# Patient Record
Sex: Female | Born: 1935 | Race: White | Hispanic: No | Marital: Married | Smoking: Never smoker
Health system: Southern US, Community
[De-identification: ages and names within clinical notes are randomized; demographics above are authoritative.]

## PROBLEM LIST (undated history)

## (undated) DIAGNOSIS — M069 Rheumatoid arthritis, unspecified: Secondary | ICD-10-CM

## (undated) DIAGNOSIS — I442 Atrioventricular block, complete: Secondary | ICD-10-CM

## (undated) DIAGNOSIS — I890 Lymphedema, not elsewhere classified: Secondary | ICD-10-CM

## (undated) DIAGNOSIS — I10 Essential (primary) hypertension: Secondary | ICD-10-CM

## (undated) DIAGNOSIS — I872 Venous insufficiency (chronic) (peripheral): Secondary | ICD-10-CM

## (undated) DIAGNOSIS — I340 Nonrheumatic mitral (valve) insufficiency: Secondary | ICD-10-CM

## (undated) DIAGNOSIS — I5189 Other ill-defined heart diseases: Secondary | ICD-10-CM

## (undated) DIAGNOSIS — I4821 Permanent atrial fibrillation: Secondary | ICD-10-CM

## (undated) DIAGNOSIS — I272 Pulmonary hypertension, unspecified: Secondary | ICD-10-CM

## (undated) DIAGNOSIS — G4730 Sleep apnea, unspecified: Secondary | ICD-10-CM

## (undated) DIAGNOSIS — I639 Cerebral infarction, unspecified: Secondary | ICD-10-CM

## (undated) DIAGNOSIS — R5381 Other malaise: Secondary | ICD-10-CM

## (undated) DIAGNOSIS — Z66 Do not resuscitate: Secondary | ICD-10-CM

## (undated) DIAGNOSIS — E669 Obesity, unspecified: Secondary | ICD-10-CM

## (undated) DIAGNOSIS — J984 Other disorders of lung: Secondary | ICD-10-CM

## (undated) HISTORY — DX: Other ill-defined heart diseases: I51.89

## (undated) HISTORY — DX: Other disorders of lung: J98.4

## (undated) HISTORY — DX: Other malaise: R53.81

## (undated) HISTORY — DX: Essential (primary) hypertension: I10

## (undated) HISTORY — DX: Venous insufficiency (chronic) (peripheral): I87.2

## (undated) HISTORY — DX: Pulmonary hypertension, unspecified: I27.20

## (undated) HISTORY — DX: Atrioventricular block, complete: I44.2

## (undated) HISTORY — DX: Cerebral infarction, unspecified: I63.9

## (undated) HISTORY — DX: Permanent atrial fibrillation: I48.21

## (undated) HISTORY — DX: Rheumatoid arthritis, unspecified: M06.9

## (undated) HISTORY — DX: Sleep apnea, unspecified: G47.30

## (undated) HISTORY — DX: Obesity, unspecified: E66.9

---

## 2000-11-05 ENCOUNTER — Emergency Department (HOSPITAL_COMMUNITY): Admission: EM | Admit: 2000-11-05 | Discharge: 2000-11-05 | Payer: Self-pay | Admitting: Emergency Medicine

## 2000-11-05 ENCOUNTER — Encounter: Payer: Self-pay | Admitting: Emergency Medicine

## 2001-02-26 ENCOUNTER — Other Ambulatory Visit: Admission: RE | Admit: 2001-02-26 | Discharge: 2001-02-26 | Payer: Self-pay | Admitting: Orthopaedic Surgery

## 2002-09-30 DIAGNOSIS — Z7901 Long term (current) use of anticoagulants: Secondary | ICD-10-CM | POA: Insufficient documentation

## 2004-07-11 ENCOUNTER — Ambulatory Visit (HOSPITAL_COMMUNITY): Admission: RE | Admit: 2004-07-11 | Discharge: 2004-07-11 | Payer: Self-pay | Admitting: Ophthalmology

## 2004-11-27 ENCOUNTER — Ambulatory Visit (HOSPITAL_COMMUNITY): Admission: RE | Admit: 2004-11-27 | Discharge: 2004-11-27 | Payer: Self-pay | Admitting: Ophthalmology

## 2005-01-15 ENCOUNTER — Ambulatory Visit: Payer: Self-pay | Admitting: Family Medicine

## 2005-02-17 ENCOUNTER — Ambulatory Visit: Payer: Self-pay | Admitting: Family Medicine

## 2005-03-05 ENCOUNTER — Ambulatory Visit: Payer: Self-pay | Admitting: Family Medicine

## 2005-03-06 ENCOUNTER — Encounter (HOSPITAL_COMMUNITY): Admission: RE | Admit: 2005-03-06 | Discharge: 2005-04-05 | Payer: Self-pay | Admitting: Family Medicine

## 2005-03-07 ENCOUNTER — Ambulatory Visit (HOSPITAL_COMMUNITY): Payer: Self-pay | Admitting: Family Medicine

## 2005-03-26 ENCOUNTER — Ambulatory Visit: Payer: Self-pay | Admitting: Family Medicine

## 2005-05-01 ENCOUNTER — Ambulatory Visit: Payer: Self-pay | Admitting: Family Medicine

## 2005-06-05 ENCOUNTER — Ambulatory Visit: Payer: Self-pay | Admitting: Family Medicine

## 2005-07-09 ENCOUNTER — Inpatient Hospital Stay (HOSPITAL_COMMUNITY): Admission: EM | Admit: 2005-07-09 | Discharge: 2005-07-12 | Payer: Self-pay | Admitting: Emergency Medicine

## 2005-08-07 ENCOUNTER — Ambulatory Visit: Payer: Self-pay | Admitting: Family Medicine

## 2005-09-25 ENCOUNTER — Ambulatory Visit: Payer: Self-pay | Admitting: Family Medicine

## 2005-12-25 ENCOUNTER — Ambulatory Visit: Payer: Self-pay | Admitting: Family Medicine

## 2006-03-30 ENCOUNTER — Ambulatory Visit: Payer: Self-pay | Admitting: Family Medicine

## 2006-04-15 ENCOUNTER — Ambulatory Visit: Payer: Self-pay | Admitting: Vascular Surgery

## 2006-04-27 ENCOUNTER — Ambulatory Visit (HOSPITAL_COMMUNITY): Admission: RE | Admit: 2006-04-27 | Discharge: 2006-04-27 | Payer: Self-pay | Admitting: Vascular Surgery

## 2006-04-27 ENCOUNTER — Ambulatory Visit: Payer: Self-pay | Admitting: Vascular Surgery

## 2006-04-28 ENCOUNTER — Ambulatory Visit: Payer: Self-pay | Admitting: Family Medicine

## 2006-06-16 ENCOUNTER — Ambulatory Visit: Payer: Self-pay | Admitting: Family Medicine

## 2006-08-27 ENCOUNTER — Emergency Department (HOSPITAL_COMMUNITY): Admission: EM | Admit: 2006-08-27 | Discharge: 2006-08-27 | Payer: Self-pay | Admitting: Emergency Medicine

## 2006-10-27 ENCOUNTER — Ambulatory Visit: Payer: Self-pay | Admitting: Vascular Surgery

## 2006-11-12 ENCOUNTER — Ambulatory Visit: Payer: Self-pay | Admitting: Cardiology

## 2010-06-11 NOTE — Assessment & Plan Note (Signed)
OFFICE VISIT   [name], [name]  DOB:  [birth year]                                       10/27/2006  ZOXWR#:[id number]   I saw the patient in the office today for continued followup of her  venous stasis ulcers.  I had originally seen her in March of this year  with bilateral significant venous stasis ulcers, and also what I thought  was some underlying peripheral vascular disease.  For this reason, she  underwent an arteriogram, but in fact was found to have no significant  arterial occlusive disease.  The infrarenal aorta, bilateral common  iliac arteries, and bilateral external iliac arteries were widely  patent.  On the right side, there was very minimal stenosis right at the  distal common femoral artery extending into the deep femoral artery.  The superficial femoral artery and popliteal artery were patent on the  right.  There was some moderate diffuse disease throughout the anterior  tibial artery on the right, although she had 3 vessels run off the  dominant, being the posterior tibial artery.  On the left side, common  femoral, superficial femoral, poplitea, and tibial vessels were all  widely patent with no significant disease noted.  She comes in for a  followup visit.  Of note, we arranged for her to have an extensive  venous evaluation while she was here today.   Since I saw her last, her wounds have been treated at the wound care  center in [place], and according to the family, she is making progress  here.  She has had no fevers or chills.   REVIEW OF SYSTEMS:  She has had no chest pain or chest pressure.   PHYSICAL EXAMINATION:  Blood pressure is 123/79, heart rate is 82.  She  has palpable femoral pulses with biphasic signals in the dorsalis pedis  position bilaterally.  Because of the swelling, I did not try to obtain  posterior tibial signals.  She has essentially a circumferential wound  on the right leg with a less extensive wound on  the left.  These wounds  are seeping.   I have stressed the importance of leg elevation and continued dressing  changes with compression therapy.  Of note, her duplex study showed no  evidence of DVT.  She does have a perforator distally on both sides.   I have recommended that we continue with aggressive wound care at the  [facility] and with elevation.  Hopefully, we can get these  wounds to heal, and then she could be evaluated by either Dr. Arbie Cookey or  Dr. Hart Rochester to see if the perforating vein could be addressed to prevent  future ulceration.  I plan on seeing her back in 6 weeks.  She knows to  call sooner if she has problems.   Di Kindle. Edilia Bo, M.D.  Electronically Signed   CSD/MEDQ  D:  10/27/2006  T:  10/28/2006  Job:  [id number]

## 2010-06-11 NOTE — Procedures (Signed)
LOWER EXTREMITY VENOUS REFLUX EXAM   INDICATION:  Bilateral leg ulcers and pain.   EXAM:  Using color-flow imaging and pulse Doppler spectral analysis, the  right/left common femoral, superficial femoral, popliteal, posterior  tibial, greater and lesser saphenous veins are evaluated.  There is no  evidence suggesting deep venous insufficiency in the right/left lower  extremity.   The right/left saphenofemoral junction is competent.  The right/left GSV  is competent with the caliber as described below.   The right/left proximal short saphenous vein demonstrates competency.   GSV Diameter (used if found to be incompetent only)                                            Right    Left  Proximal Greater Saphenous Vein           cm       cm  Proximal-to-mid-thigh                     cm       cm  Mid thigh                                 cm       cm  Mid-distal thigh                          cm       cm  Distal thigh                              cm       cm  Knee                                      cm       cm   IMPRESSION:  1. No evidence of reflux noted in the right leg.  2. Perforators noted in bilateral calves, area of ulcers.  3. The right/left greater saphenous vein is not aneurysmal.  4. The right/left greater saphenous vein is not tortuous.  5. There is no evidence of significant deep venous insufficiency in      the left lower extremity.  6. The deep venous system is competent.  7. The right/left lesser saphenous vein is competent.  8. No evidence of deep venous thrombosis noted in bilateral legs.   ___________________________________________  Di Kindle. Edilia Bo, M.D.   MG/MEDQ  D:  10/27/2006  T:  10/27/2006  Job:  [id number]

## 2010-06-14 NOTE — Op Note (Signed)
NAMEBRITTENY, [name]             ACCOUNT NO.:  [id number]   MEDICAL RECORD NO.:  [id number]          PATIENT TYPE:  AMB   LOCATION:  DAY                           FACILITY:  [facility]   PHYSICIAN:  Trish Fountain, MD    DATE OF BIRTH:  01-Jul-1933   DATE OF PROCEDURE:  07/11/2004  DATE OF DISCHARGE:  07/11/2004                                 OPERATIVE REPORT   PREOPERATIVE DIAGNOSIS:  Cataract, right eye.   POSTOPERATIVE DIAGNOSIS:  Cataract, right eye.   SURGERY:  Kelman phacoemulsification, right eye, with posterior chamber  intraocular lens, right eye.   ANESTHESIA:  MAC with topical anesthesia of the right eye.   SURGEON:  Trish Fountain, MD   SPECIMENS:  None.   COMPLICATIONS:  None.   HISTORY:  This is a 77 year old female who has slowly progressive decrease  in vision in the right eye.   Lens model is AMO CLRFLXB 19.5 diopter, serial # [id number].   DESCRIPTION OF PROCEDURE:  In the preoperative area, the patient had  Cyclogyl and Neo-Synephrine drops in the right eye in order to dilate the  eye along with Tetracaine to help anesthetize the eye.  Once the patient's  right eye was dilated, the patient was taken to the operating room and  prepped.  The right eye was prepped and draped in the usual sterile manner.  A lid speculum was placed in the right eye, and 2% Xylocaine jelly was  placed in the right eye as well.  A paracentesis was made through clear  cornea at the limbus at approximately the 11 o'clock position of the right  eye.  Nonpreserved Xylocaine 1% 1 cc was placed into the anterior chamber  for one minute.  Viscoat was then used to fill the anterior chamber.  Using  a 2.75 mm blade at the 9 o'clock position, an incision into the anterior  chamber was made through clear cornea near the limbus.  Viscoat was again  used to reform the anterior chamber.  A 25 gauge bent capsulotomy needle was  used to begin the capsulorrhexis through the anterior capsule of  the lens.  Utrata forceps were used to make a 360 degree anterior capsulorrhexis.  A 27  gauge irrigating cannula was used to hydrodissect and hydrodelineate the  nucleus.  Once hydrodissection and hydrodelineation was carried out, [facility]  phacoemulsification was used to make a deep groove in the lens nucleus.  The  lens was rotated 360 degrees and divided into four quadrants using deep  grooves made by phacoemulsification with the [facility] phacoemulsification tip.  The nucleus was then divided using the phaco tip and the nucleus  manipulator.  The nuclear quadrants were then removed using  phacoemulsification.  The posterior capsule was quite loose and fluctuant  throughout the quadrant removal and despite stopping phaco several times in  order to release the posterior capsule from the phaco tip, and using the  nucleus rotator to protect the posterior capsule, the phaco tip did engage  the posterior capsule and created a small round hole just as the last bits  of nucleus  were being removed.  Viscoat was used to plug the posterior  capsular opening.  The irrigation/aspiration was then used to remove as much  of the cortex as could safely be removed without causing any vitreous to  come forward.  The anterior chamber and posterior capsule was filled with  Provisc, and the 9 o'clock position incision was slightly widened, using the  same 2.75 mm blade that was initially used to make the incision.  An  intraocular lens was placed in the shooter, and this was placed in the eye  the leading haptic being placed in the sulcus just behind the iris, followed  by placement of the trailing haptic into the sulcus, using the Kugelan.  The  IOL centered well because the entire anterior capsule peripherally remained  intact.  Irrigation/aspiration was then used again to remove more of the  cortical remnants using the IOL to tamponade the posterior capsular hole.  Some small bits of cortex remained and were  unable to be removed without  causing the vitreous to come forward and compromising the remainder of the  surgical procedure.  Miochol was used to constrict the pupil, and it was  noted that there was no vitreous anterior to the IOL or in the incision  using the iris sweep.  Two 10-0 nylon sutures were used to close the  incision, which was again checked for vitreous--there was none.  The  incision site was then checked for water tightness, using a Weck-cel.  Half-  strength Betadine solution was placed, 1 drop, in the inner canthus, and 1  drop in the outer canthus.  After one minute, this was rinsed from the eye.  Drops were placed in the eye, Vigamox, followed by Nevanac followed by  Econopred.  A shield was placed over the patient's right eye, and the  patient was sent to the recovery room in satisfactory condition.       PVK/MEDQ  D:  07/15/2004  T:  07/15/2004  Job:  [id number]

## 2010-06-14 NOTE — Op Note (Signed)
[name], [name]             ACCOUNT NO.:  [id number]   MEDICAL RECORD NO.:  [id number]          PATIENT TYPE:  AMB   LOCATION:  SDS                          FACILITY:  [facility]   PHYSICIAN:  Di Kindle. Edilia Bo, M.D.DATE OF BIRTH:  1933/09/10   DATE OF PROCEDURE:  04/27/2006  DATE OF DISCHARGE:                               OPERATIVE REPORT   PREOPERATIVE DIAGNOSIS:  Nonhealing venous stasis ulcers.   POSTOPERATIVE DIAGNOSIS:  Nonhealing venous stasis ulcers.   PROCEDURES:  1. Aortogram.  2. Bilateral iliac arteriogram.  3. Bilateral lower extremity runoff.   SURGEON:  Di Kindle. Edilia Bo, M.D.   INDICATIONS:  This is a 77 year old woman with a history of obesity and  chronic venous insufficiency, who has had venous stasis ulcers for over  a year.  These would intermittently heal and then recur.  She underwent  a Doppler study at [facility], which showed an ankle brachial  index of 71% of the right and 55% of left.  Digital pressure on the  right was 76 mmHg and on the left 79 mmHg.  Although typically a  pressure in the 70s with suggest adequate circulation to healing, given  the prolonged nature of these wounds I felt arteriography was indicated  in order to determine if there were any options for revascularization.  The procedure and potential complications including but not limited to  bleeding, arterial injury, dye reaction, and kidney failure, were  discussed with the patient.  All her questions were answered and she was  agreeable to proceed.   TECHNIQUE:  The patient was taken to the PV lab at [facility] and sedated with  a milligram of Versed and 50 mcg of fentanyl.  Both groins were prepped  and draped in the usual sterile fashion.  After the skin was infiltrated  with 1% lidocaine, the right common femoral artery was cannulated and a  guidewire introduced into the infrarenal aorta under fluoroscopic  control.  The 5-French sheath was introduced over the  wire and the  dilator was removed.  A pigtail catheter was positioned at the L1  vertebral body and flush aortogram obtained.  The catheter was then  repositioned above the aortic bifurcation and oblique iliac projections  were obtained.  Bilateral lower extremity runoff films were then  obtained.  The patient then had additional spot films obtained of both  legs.   FINDINGS:  There are single renal arteries bilaterally with no  significant renal artery stenosis identified.  The celiac and SMA filled  briskly.  The infrarenal aorta, bilateral common iliac arteries,  bilateral hypogastric arteries, bilateral external iliac arteries are  widely patent bilaterally with no significant disease.   On the right side the common femoral artery is patent with a very  minimal stenosis right at the distal common femoral artery extending  into the deep femoral artery.  The superficial femoral and popliteal  artery are patent on the right.  There is moderate diffuse disease  throughout the anterior tibial on the right but through all three  vessels are patent on the right with the dominant runoff being through  the posterior tibial artery.  The peroneal artery on the right is  patent.   On the left side the common femoral, superficial femoral, deep femoral,  popliteal, anterior tibial, posterior tibial and peroneal arteries are  all widely patent.  There is no significant disease noted.   IMPRESSION:  1. Minimal stenosis of the right groin of probably 10%.  2. Some moderate diffuse disease of the anterior tibial artery on the      right.      Di Kindle. Edilia Bo, M.D.  Electronically Signed     CSD/MEDQ  D:  04/27/2006  T:  04/27/2006  Job:  [id number]   cc:   Helayne Seminole, M.D.  Judene Companion, M.D.

## 2010-06-14 NOTE — Op Note (Signed)
[name], [name]             ACCOUNT NO.:  [id number]   MEDICAL RECORD NO.:  [id number]          PATIENT TYPE:  AMB   LOCATION:  DAY                           FACILITY:  [facility]   PHYSICIAN:  Trish Fountain, MD    DATE OF BIRTH:  1933/07/10   DATE OF PROCEDURE:  11/27/2004  DATE OF DISCHARGE:                                 OPERATIVE REPORT   /PREOPERATIVE DIAGNOSIS:  Cataract, left eye.   POSTOPERATIVE DIAGNOSIS:  Cataract, left eye.   SURGERY:  Kelman phacoemulsification, left eye, with posterior chamber  intraocular lens, left eye.   ANESTHESIA:  MAC with topical anesthesia of the left eye.   SURGEON:  Trish Fountain, MD   SPECIMENS:  None.   COMPLICATIONS:  None.   HISTORY:  This is a 77 year old female who has slowly progressive decrease  in vision in the left eye.   LENS MODEL:  AMO CLRFLXC, 20.0 diopter lens, serial #[id number].   DESCRIPTION OF PROCEDURE:  In the preoperative area, the patient had  Cyclogyl and Neo-Synephrine drops in the left eye in order to dilate the eye  along with Tetracaine to help anesthetize the eye.  Once the patient's left  eye was dilated, the patient was taken to the operating room and prepped.  The left eye was prepped and draped in the usual sterile manner.  A lid  speculum was placed in the left eye, and 2% Xylocaine jelly was placed in  the left eye as well.  A paracentesis was made through clear cornea at the  limbus at approximately the 5 o'clock position of the left eye.  Nonpreserved Xylocaine 1% 1 cc was placed into the anterior chamber for one  minute.  Viscoat was then used to fill the anterior chamber.  Using a 2.75  mm blade at the 3 o'clock position, an incision into the anterior chamber  was made through clear cornea near the limbus.  Viscoat was again used to  reform the anterior chamber.  A 25 gauge bent capsulotomy needle was used to  begin the capsulorrhexis through the anterior capsule of the lens.  Utrata  forceps were used to make a 360 degree anterior capsulorrhexis.  A Chang 27  gauge irrigating cannula was used to hydrodissect and hydrodelineate the  nucleus.  Once hydrodissection and hydrodelineation was carried out, [facility]  phacoemulsification was used to make a deep groove in the lens nucleus.  The  lens was rotated 360 degrees and divided into four quadrants using deep  grooves made by phacoemulsification with the [facility] phacoemulsification tip.  The nucleus was then divided using the phaco tip and the nucleus  manipulator.  The nuclear quadrants were then removed using  phacoemulsification.  The irrigation aspiration was then used to remove the  remainder of the cortex.  The anterior chamber and posterior capsule was  filled with Provisc, and the 3 o'clock position incision was slightly  widened, using the same 2.75 mm blade that was initially used to make the  incision.  An intraocular lens was placed in the shooter, and this was  placed in the  eye, followed by placement of the trailing haptic into the  posterior capsule, using the [place].  Irrigation/aspiration was then used  to remove Provisc from the anterior chamber and the posterior capsule.  BSS  on a syringe was then used to hydrate the cornea at the 3 o'clock incision  site.  The incision site was then checked for water tightness, using a Weck-  cel.  Half-strength Betadine solution was placed, 1 drop, in the inner  canthus, and 1 drop in the outer canthus.  After one minute, this was rinsed  from the eye.  Drops were placed in the eye, Vigamox, followed by Nevanac  followed by Econopred.  A shield was placed over the patient's left eye, and  the patient was sent to the recovery room in satisfactory condition.      Trish Fountain, MD  Electronically Signed     PVK/MEDQ  D:  11/28/2004  T:  11/28/2004  Job:  [id number]

## 2010-06-14 NOTE — H&P (Signed)
NAMERONETTE, HANK             ACCOUNT NO.:  [id number]   MEDICAL RECORD NO.:  [id number]          PATIENT TYPE:  INP   LOCATION:  A313                          FACILITY:  [facility]   PHYSICIAN:  Hanley Hays. Dechurch, M.D.DATE OF BIRTH:  [birth year]   DATE OF ADMISSION:  07/09/2005  DATE OF DISCHARGE:  LH                                HISTORY & PHYSICAL   HISTORY OF PRESENT ILLNESS:  This is a 77 year old Caucasian female followed  by Dr. Joette Catching in [place] with a past medical history remarkable for  hypertension, chronic venous stasis, chronic atrial fibrillation not on  Coumadin, secondary to chronic blood loss apparently, who has been  hospitalized multiple times at [facility] but not in the [facility]  system, who presents with a 2-3 day history of worsening lower extremity  pain.  She has chronic bilateral lower extremity ulcers which have been  treated with wet-to-dry dressings, apparently by Home Health without  significant improvement.  She has noted some increased redness, though no  fever or chills.  Most importantly she has had increasing pain.  She does  have a mild leukocytosis and because of this she is being admitted for IV  antibiotics and further evaluation and treatment.   PAST MEDICAL HISTORY:  1.  Remarkable for rheumatoid arthritis on chronic prednisone therapy.  2.  History of atrial fibrillation.  3.  History of congestive heart failure, though she apparently had a      catheterization several years ago and was told she does not have      obstructive disease.  4.  Obesity.  5.  History of obstructive sleep apnea on BiPAP which she has not been using      much over the last several months, as she has lost weight and doesn't      need it.  6.  She has a history of cervical polyps.  7.  Tonsillectomy, but no other surgeries.   MEDICATIONS:  1.  Diovan 80 mg daily.  2.  Coreg 25 mg daily.  3.  Lasix 20 mg daily.  4.  Digoxin 0.25 mg every other  day.  5.  Prednisone 10 mg daily.  6.  Prevacid 30 mg daily.  7.  Multivitamin daily.  8.  Glucosamine.  9.  Cod liver oil.   ALLERGIES:  She has no known allergies.   SOCIAL HISTORY:  She lives with her husband of 37 years of age who is  apparently in fairly good health.  No alcohol or tobacco abuse.  She has  four children who live locally and are alive and well.  She has three  younger siblings.   FAMILY HISTORY:  Apparently no known coronary artery disease or diabetes,  colon cancer, etc.  Mother is still living with Alzheimer's.  Father died at  age 3.  He had a history of coronary artery disease with bypass at age 32.   REVIEW OF SYSTEMS:  The patient is actually independent with ADLs, though  fairly sedentary.  No GI or GU complaints.  Intentional weight loss, though  she has gained  back 20 pounds of the 70 she lost.  Good appetite.  She  states she is doing very well.  She denies any shortness of breath, though  she appears dyspneic with exertion.  She uses oxygen on an as needed basis  but usually at night.  Pain has been well-controlled with Aleve at home.  She apparently does not tolerate Coumadin secondary to bleeding, specifics  unknown.   PHYSICAL EXAMINATION:  GENERAL:  Reveals a pleasant, elderly female, obese,  no distress, though she is dyspneic with conversation, though denies.  VITAL SIGNS:  Blood pressure is 142/88, weight 220 pounds, temperature is  97.9.  Heart rate documented at 98, now about 70.  LUNGS:  Diminished bilaterally without rales or rhonchi.  HEART:  Predominantly regular.  Cannot appreciate a murmur.  ABDOMEN:  Obese and soft.  EXTREMITIES:  Without clubbing or cyanosis.  She has chronic stasis changes.  She has large bilateral on the right a posterior full thickness wound with  granulated base.  She has a larger anterior lower extremity wound similar in  nature.  She really does not have significant cellulitis type changes.  Her  feet are  warm.  There is 1+ edema.  Pulses are palpable in the dorsalis  pedis bilaterally, though they are diminished.   ASSESSMENT/PLAN:  1.  Venous stasis  ulcers with intractable pain and potentially mild      cellulitis.  This patient has received Unasyn which will continue.  She      would benefit from a Pro 4 Unna boot given her chronic stasis, which      will help the pain as well as accelerate healing.  This will be placed      by therapy tomorrow.  She really does not have cellulitis that would      prohibit these dressings.  2.  History of chronic atrial fibrillation.  Currently rate is controlled.      She is on a somewhat complex regimen.  Until further data is available      would not make changes.  Will check a digoxin level, however.  3.  Borderline hyperkalemia.  Not on supplement.  Again, will monitor.  4.  History of anemia.  Currently her hemoglobin is stable.  No changes at      this time.  5.  Chronic prednisone therapy for her rheumatoid arthritis.  Will maintain.  6.  Pain secondary to her venous stasis.  She has received one dose of      Toradol and will resume some Percocet which has been effective for her      in the past.  She may need augmentation during the hospital stay and at      home.  Vitamin D has proven somewhat effectiveness in this setting as      far as her pain control as well as decreased falls and improved      stability and certainly this should be considered.  7.  History of obstructive sleep apnea.  The patient is not using her BiPAP      since her weight loss, therefore, will hold.      Hanley Hays Josefine Class, M.D.  Electronically Signed     FED/MEDQ  D:  07/09/2005  T:  07/09/2005  Job:  [id number]

## 2010-06-14 NOTE — Discharge Summary (Signed)
[name], [name]             ACCOUNT NO.:  [id number]   MEDICAL RECORD NO.:  [id number]          PATIENT TYPE:  INP   LOCATION:  A313                          FACILITY:  [facility]   PHYSICIAN:  Margaretmary Dys, M.D.DATE OF BIRTH:  Sep 29, 1933   DATE OF ADMISSION:  07/09/2005  DATE OF DISCHARGE:  06/16/2007LH                                 DISCHARGE SUMMARY   DISCHARGE DIAGNOSES:  1.  Chronic venous stasis ulcers of lower extremities with cellulitis.  2.  Chronic atrial fibrillation.  3.  History of rheumatoid arthritis.  4.  Hyperkalemia.   DISCHARGE MEDICATIONS:  1.  Augmentin 875 mg p.o. b.i.d. for 7 days.  2.  Vicodin 1-2 tabs p.o. every 4 hours as needed for pain.  3.  Diovan 80 mg p.o. once a day.  4.  Coreg 25 mg once a day.  5.  Lasix 20 mg p.o. once a day.  6.  Digoxin 0.25 mg every other day.  7.  Prednisone 10 mg once a day.  8.  Prevacid 30 mg once a day.  9.  Multivitamin one daily.  10. Glucosamine.  11. Cod liver oil.   DIET:  Low salt diet.   ACTIVITY:  As tolerated.  The patient encouraged to increase activity level.   SPECIAL INSTRUCTIONS:  None.   HOSPITAL COURSE:  [name] is a 77 year old Caucasian female who is  followed by Dr. Lysbeth Galas in [place].  She does have a past medical history  significant for hypertension, chronic venous stasis, chronic atrial  fibrillation, not on Coumadin secondary to chronic blood loss.  The patient  has been hospitalized multiple times at [facility] with what appears  to be cellulitis of her lower extremities.  She presents to [facility] with a 2-to-3-day history of worsening of extremity pain and some  fevers.  The patient is being dressed with wet-to-dry dressings at home with  no improvement.  She is has also had some redness which has worsened with  the pain.  In the emergency room, the patient was noted to have some mild  leukocytosis and the plan was to admit her for IV antibiotics.  While  here, the patient was started on vancomycin because of concerns for  probable MRSA.  She also received Unasyn.  The patient's white cells  recovered very well and she remained mostly afebrile throughout her  hospitalization here.  Atrial fibrillation was also well controlled.  Overall, there were no  complications during her hospital stay.  The patient did well and was  discharged home on oral antibiotics.   FOLLOWUP:  The patient is to follow up with her primary care physician in 2-  3 weeks.      Margaretmary Dys, M.D.  Electronically Signed     AM/MEDQ  D:  07/26/2005  T:  07/26/2005  Job:  [id number]   cc:   Delaney Meigs, M.D.  Fax: [phone]

## 2010-06-14 NOTE — Group Therapy Note (Signed)
[name], [name]             ACCOUNT NO.:  [id number]   MEDICAL RECORD NO.:  [id number]          PATIENT TYPE:  INP   LOCATION:  A313                          FACILITY:  [facility]   PHYSICIAN:  Osvaldo Shipper, MD     DATE OF BIRTH:  [birth year]   DATE OF PROCEDURE:  07/11/2005  DATE OF DISCHARGE:                                   PROGRESS NOTE   Subjectively, patient feels better.  She is still having pain in both her  legs, however, she states controlled with her current regimen of pain  medications.   She is also requesting a referral to [facility].  I have written her a prescription for this.  She is also requesting a  prescription for Tylenol with codeine No. 3 which also I have written.   PHYSICAL EXAMINATION:  VITAL SIGNS:  Objectively, patient remains afebrile.  Heart rate is 80, irregular, respiratory rate is 18, blood pressure is  146/73, saturation 99% on 2 liters by nasal cannula.  LUNGS:  Clear to auscultation bilaterally.  CARDIOVASCULAR:  S1, S2 is regular, rate is controlled.  ABDOMEN:  Soft, nontender, nondistended, bowel sounds present, no mass or  organomegaly appreciated.  EXTREMITIES:  Covered with Profore wraps.  No seepage is noted.  She has  good capillary refill in her toes.   LABORATORY DATA:  White count today is 11.0, hemoglobin is 10.6 (down from  11.8 yesterday), MCV is 91, platelet count is 427.  BMP shows glucose 147,  calcium is 8.3.  Digoxin is 0.5.  Wound culture is showing Gram-negative  rods.   No imaging studies have been done.   ASSESSMENT AND PLAN:  Problem 1.  Chronic venous stasis ulcers of lower  extremities with pain.  Her pain seems to be better controlled.  Continue  current regimen of Tylenol, Lortab.  She is also on Unasyn which I think she  will need for at least one more day.  I think patient has probably improved  based on what I am seeing from the notes from the previous couple of days.  I  anticipate patient being able to be discharged tomorrow.   Problem 2.  History of atrial fibrillation.  This is chronic.  Rate is  controlled.  She is not on any anticoagulation at this time.  We will defer  all these issues to her PMD.  She is on Diovan, Coreg, digoxin, Lasix.  A  fairly complicated regimen which we will not interfere with at this time.   Problem 3.  History of rheumatoid arthritis for which she is getting chronic  prednisone which is also being continued at this time.  DVT, GI prophylaxis  ongoing.   DISPOSITION:  I think patient may be able to be discharged by tomorrow if  she continues to feel better.  Her white count is down today.  Hemoglobin is  a little bit down today, but could be dilutional.  There is no overt blood  loss noted.  A repeat CBC, BMP will be checked tomorrow morning.      Osvaldo Shipper,  MD  Electronically Signed     GK/MEDQ  D:  07/11/2005  T:  07/11/2005  Job:  [id number]   cc:   Delaney Meigs, M.D.  Fax: [phone]

## 2010-06-14 NOTE — Group Therapy Note (Signed)
NAMEHANNAN, [name]             ACCOUNT NO.:  [id number]   MEDICAL RECORD NO.:  [id number]          PATIENT TYPE:  INP   LOCATION:  A313                          FACILITY:  [facility]   PHYSICIAN:  Margaretmary Dys, M.D.DATE OF BIRTH:  Nov 08, 1933   DATE OF PROCEDURE:  07/10/2005  DATE OF DISCHARGE:                                   PROGRESS NOTE   SUBJECTIVE:  Patient feels much better today.  I actually saw her having the  dressings placed and she looked quite comfortable.  She said she has some  pain, but not terribly so.   OBJECTIVE:  GENERAL:  Conscious, alert, comfortable, not in no acute  distress, pleasant.  VITAL SIGNS:  Her blood pressure is 126/70, pulse of 80, respirations 20,  temperature 98.3, oxygen saturation is 100% on 2 liters.  HEENT EXAM:  Normocephalic and atraumatic.  Oral mucosa was moist, no  exudates.  NECK:  Supple, no JVD.  LUNGS:  Clear.  HEART:  S1-S2, regular.  ABDOMEN:  Soft, bowel sounds positive.  EXTREMITIES:  Bilateral ulcers of the lateral aspect of both lower  extremities, fairly extensive with a large anterior lower extremity wound  similar in nature with surrounding cellulitis.  The base of the ulcers look  pretty clean though.  She also had evidence of chronic stasis dermatitis.   LABORATORY/DIAGNOSTIC DATA:  White blood cell count was 9.8, hemoglobin  11.8, hematocrit 36.4, platelet count was 414, no left shift.  Sodium 141,  potassium 4.5, chloride 114, CO2 of 19, glucose 101, BUN of 17, creatinine  0.8.  Digoxin level was 0.5.   ASSESSMENT AND PLAN:  1.  Chronic leg ulcers with cellulitis with severe pain.  Patient doing      fairly well.  She received some Unasyn.  We will continue with efforts      to improve her stasis and also daily dressing.  She is currently having      Vaseline gauze dressings.  Dressings may not be changed for 5-7 days.  2.  Chronic atrial fibrillation.  Rate is controlled.  Patient is      comfortable at this  time.  3.  Hyperkalemia.  This is improved.  4.  Chronic prednisone therapy due to rheumatoid arthritis, stable.   DISPOSITION:  I think, once we are able to give her the IV antibiotics over  the next 2-3 days and continue with dressing, we will have to come with a  plan, either through home health or through dressing here.  I discussed with  physical therapy.  She said she will only need dressing once a week.  The  dressings will only need to be changed if she soaks through the dressings.      Margaretmary Dys, M.D.  Electronically Signed     AM/MEDQ  D:  07/10/2005  T:  07/10/2005  Job:  [id number]

## 2010-08-12 ENCOUNTER — Inpatient Hospital Stay (HOSPITAL_COMMUNITY)
Admission: EM | Admit: 2010-08-12 | Discharge: 2010-08-14 | DRG: 243 | Disposition: A | Discharge status: Home Health | Payer: Medicare PPO | Attending: Cardiovascular Disease | Admitting: Cardiovascular Disease

## 2010-08-12 ENCOUNTER — Emergency Department (HOSPITAL_COMMUNITY): Payer: Medicare PPO

## 2010-08-12 DIAGNOSIS — E669 Obesity, unspecified: Secondary | ICD-10-CM | POA: Diagnosis present

## 2010-08-12 DIAGNOSIS — I10 Essential (primary) hypertension: Secondary | ICD-10-CM | POA: Diagnosis present

## 2010-08-12 DIAGNOSIS — R55 Syncope and collapse: Secondary | ICD-10-CM

## 2010-08-12 DIAGNOSIS — I872 Venous insufficiency (chronic) (peripheral): Secondary | ICD-10-CM | POA: Diagnosis present

## 2010-08-12 DIAGNOSIS — G4733 Obstructive sleep apnea (adult) (pediatric): Secondary | ICD-10-CM | POA: Diagnosis present

## 2010-08-12 DIAGNOSIS — I4891 Unspecified atrial fibrillation: Secondary | ICD-10-CM | POA: Diagnosis present

## 2010-08-12 DIAGNOSIS — I442 Atrioventricular block, complete: Secondary | ICD-10-CM | POA: Diagnosis present

## 2010-08-12 DIAGNOSIS — Z79899 Other long term (current) drug therapy: Secondary | ICD-10-CM

## 2010-08-12 DIAGNOSIS — I509 Heart failure, unspecified: Secondary | ICD-10-CM | POA: Diagnosis present

## 2010-08-12 DIAGNOSIS — I498 Other specified cardiac arrhythmias: Principal | ICD-10-CM | POA: Diagnosis present

## 2010-08-12 DIAGNOSIS — L97209 Non-pressure chronic ulcer of unspecified calf with unspecified severity: Secondary | ICD-10-CM | POA: Diagnosis present

## 2010-08-12 DIAGNOSIS — I2789 Other specified pulmonary heart diseases: Secondary | ICD-10-CM | POA: Diagnosis present

## 2010-08-12 DIAGNOSIS — M069 Rheumatoid arthritis, unspecified: Secondary | ICD-10-CM | POA: Diagnosis present

## 2010-08-12 DIAGNOSIS — I5032 Chronic diastolic (congestive) heart failure: Secondary | ICD-10-CM | POA: Diagnosis present

## 2010-08-12 DIAGNOSIS — I739 Peripheral vascular disease, unspecified: Secondary | ICD-10-CM | POA: Diagnosis present

## 2010-08-12 DIAGNOSIS — R5381 Other malaise: Secondary | ICD-10-CM | POA: Diagnosis present

## 2010-08-12 LAB — POCT I-STAT, CHEM 8
BUN: 33 mg/dL — ABNORMAL HIGH (ref 6–23)
Calcium, Ion: 1.13 mmol/L (ref 1.12–1.32)
Chloride: 111 mEq/L (ref 96–112)
Creatinine, Ser: 1.2 mg/dL — ABNORMAL HIGH (ref 0.50–1.10)
Glucose, Bld: 113 mg/dL — ABNORMAL HIGH (ref 70–99)
HCT: 35 % — ABNORMAL LOW (ref 36.0–46.0)
Hemoglobin: 11.9 g/dL — ABNORMAL LOW (ref 12.0–15.0)
Potassium: 4.8 mEq/L (ref 3.5–5.1)
Sodium: 138 mEq/L (ref 135–145)
TCO2: 21 mmol/L (ref 0–100)

## 2010-08-12 LAB — BASIC METABOLIC PANEL
BUN: 29 mg/dL — ABNORMAL HIGH (ref 6–23)
Creatinine, Ser: 1.14 mg/dL — ABNORMAL HIGH (ref 0.50–1.10)
GFR calc non Af Amer: 46 mL/min — ABNORMAL LOW (ref >60)
Glucose, Bld: 110 mg/dL — ABNORMAL HIGH (ref 70–99)
Potassium: 4.8 mEq/L (ref 3.5–5.1)

## 2010-08-12 LAB — COMPREHENSIVE METABOLIC PANEL
ALT: 8 U/L (ref 0–35)
AST: 12 U/L (ref 0–37)
CO2: 23 mEq/L (ref 19–32)
Chloride: 105 mEq/L (ref 96–112)
Creatinine, Ser: 1.15 mg/dL — ABNORMAL HIGH (ref 0.50–1.10)
GFR calc Af Amer: 56 mL/min — ABNORMAL LOW (ref >60)
GFR calc non Af Amer: 46 mL/min — ABNORMAL LOW (ref >60)
Glucose, Bld: 124 mg/dL — ABNORMAL HIGH (ref 70–99)
Sodium: 138 mEq/L (ref 135–145)
Total Bilirubin: 0.3 mg/dL (ref 0.3–1.2)

## 2010-08-12 LAB — DIFFERENTIAL
Basophils Absolute: 0 10*3/uL (ref 0.0–0.1)
Basophils Relative: 0 % (ref 0–1)
Eosinophils Absolute: 0.2 10*3/uL (ref 0.0–0.7)
Eosinophils Relative: 2 % (ref 0–5)
Lymphocytes Relative: 21 % (ref 12–46)
Lymphs Abs: 2.1 10*3/uL (ref 0.7–4.0)
Monocytes Absolute: 1 10*3/uL (ref 0.1–1.0)
Monocytes Relative: 10 % (ref 3–12)
Neutro Abs: 6.6 10*3/uL (ref 1.7–7.7)
Neutrophils Relative %: 67 % (ref 43–77)

## 2010-08-12 LAB — URINALYSIS, ROUTINE W REFLEX MICROSCOPIC
Leukocytes, UA: NEGATIVE
Nitrite: NEGATIVE
Protein, ur: NEGATIVE mg/dL
Urobilinogen, UA: 0.2 mg/dL (ref 0.0–1.0)

## 2010-08-12 LAB — CBC
HCT: 37.2 % (ref 36.0–46.0)
MCHC: 32.8 g/dL (ref 30.0–36.0)
Platelets: 185 10*3/uL (ref 150–400)
RDW: 13.9 % (ref 11.5–15.5)
WBC: 9.8 10*3/uL (ref 4.0–10.5)

## 2010-08-12 LAB — TYPE AND SCREEN: Antibody Screen: NEGATIVE

## 2010-08-12 LAB — CK TOTAL AND CKMB (NOT AT ARMC)
CK, MB: 1.4 ng/mL (ref 0.3–4.0)
Relative Index: INVALID (ref 0.0–2.5)
Relative Index: INVALID (ref 0.0–2.5)
Total CK: 21 U/L (ref 7–177)

## 2010-08-12 LAB — ABO/RH: ABO/RH(D): A POS

## 2010-08-13 DIAGNOSIS — I369 Nonrheumatic tricuspid valve disorder, unspecified: Secondary | ICD-10-CM

## 2010-08-13 HISTORY — PX: PACEMAKER INSERTION: SHX728

## 2010-08-13 LAB — POCT I-STAT, CHEM 8
BUN: 46 mg/dL — ABNORMAL HIGH (ref 6–23)
Calcium, Ion: 1.03 mmol/L — ABNORMAL LOW (ref 1.12–1.32)
Chloride: 109 mEq/L (ref 96–112)
Creatinine, Ser: 1.4 mg/dL — ABNORMAL HIGH (ref 0.50–1.10)
Glucose, Bld: 122 mg/dL — ABNORMAL HIGH (ref 70–99)
HCT: 36 % (ref 36.0–46.0)
Hemoglobin: 12.2 g/dL (ref 12.0–15.0)
Potassium: 7.3 mEq/L (ref 3.5–5.1)
Sodium: 135 mEq/L (ref 135–145)
TCO2: 15 mmol/L (ref 0–100)

## 2010-08-13 LAB — URINE CULTURE: Culture: NO GROWTH

## 2010-08-13 LAB — CBC
MCH: 30.4 pg (ref 26.0–34.0)
MCHC: 32.3 g/dL (ref 30.0–36.0)
MCV: 94.1 fL (ref 78.0–100.0)
Platelets: 186 10*3/uL (ref 150–400)
RDW: 13.7 % (ref 11.5–15.5)

## 2010-08-13 LAB — BASIC METABOLIC PANEL
CO2: 22 mEq/L (ref 19–32)
Calcium: 8.8 mg/dL (ref 8.4–10.5)
Creatinine, Ser: 0.79 mg/dL (ref 0.50–1.10)
GFR calc Af Amer: >60 mL/min (ref >60)

## 2010-08-13 NOTE — Cardiovascular Report (Signed)
  NAMELACEE, GREY NO.:  [id number]  MEDICAL RECORD NO.:  [id number]  LOCATION:  2901                         FACILITY:  [facility]  PHYSICIAN:  Verne Carrow, MDDATE OF BIRTH:  August 05, 1933  DATE OF PROCEDURE:  08/12/2010 DATE OF DISCHARGE:                           CARDIAC CATHETERIZATION   PROCEDURE PERFORMED:  Temporary pacemaker placement.  OPERATOR:  Verne Carrow, MD  INDICATION:  Profound bradycardia.  PROCEDURE IN DETAIL:  The patient was brought to the main cardiac catheterization laboratory after signing informed consent for placement of a temporary transvenous pacemaker.  The patient was placed supine on cath table.  The right groin was prepped and draped in sterile fashion. A 1% lidocaine was used for local anesthesia.  A 6-French sheath was inserted into the right femoral vein without difficulty.  A temporary transvenous pacemaker was placed into the right ventricle.  The patient tolerated the procedure well.  There were no immediate complications. The patient was taken to the recovery area in stable condition.  RECOMMENDATIONS:  We will continue temporary transvenous pacing for at least 24 hours.  We will then consider permanent pacemaker placement if the patient is still bradycardic and requiring the pacemaker for support.     Verne Carrow, MD     CM/MEDQ  D:  08/12/2010  T:  08/13/2010  Job:  [id number]  Electronically Signed by Verne Carrow MD on 08/13/2010 05:57:24 PM

## 2010-08-14 ENCOUNTER — Inpatient Hospital Stay (HOSPITAL_COMMUNITY): Payer: Medicare PPO

## 2010-08-14 LAB — BASIC METABOLIC PANEL
BUN: 20 mg/dL (ref 6–23)
Chloride: 107 mEq/L (ref 96–112)
GFR calc Af Amer: >60 mL/min (ref >60)
Potassium: 4 mEq/L (ref 3.5–5.1)

## 2010-08-15 NOTE — H&P (Signed)
NAMENORENA, BRATTON NO.:  [id number]  MEDICAL RECORD NO.:  [id number]  LOCATION:  2901                         FACILITY:  [facility]  PHYSICIAN:  Veverly Fells. Excell Seltzer, MD  DATE OF BIRTH:  [birth year]  DATE OF ADMISSION:  08/12/2010 DATE OF DISCHARGE:                             HISTORY & PHYSICAL   CHIEF COMPLAINT:  Syncope.  HISTORY OF PRESENT ILLNESS:  Ms. Orren is a 77 year old woman with a history of atrial fibrillation and congestive heart failure.  She was in her normal state of health until about 2 weeks ago when she began to develop spells of weakness, dizziness, and syncope.  She has had at least 2 falls over this time.  She apparently was evaluated at [facility] but no clear etiology was found.  She has continued to worsen since that time and became increasingly weak today.  She called the EMS and upon their arrival, she had marked bradycardia with a heart rate in the 20s.  She was given 0.5 mg of atropine but did not really respond to this.  She has been awake and alert throughout the initial evaluation. The patient was brought to the [facility] emergency department and we were asked to evaluate her.  The patient denies chest pain.  She denies palpitations, dyspnea, orthopnea, or PND.  She does have chronic leg edema and this has been worse over the past few weeks.  She has no other complaints.  Upon our evaluation, she reports no recent medication changes.  HOME MEDICATIONS:  A complete list is not currently available.  The patient is aware that she takes carvedilol and furosemide.  She also uses a home nebulizer.  I am not sure of all of her other medicines as her list is not available at the present time.  ALLERGIES:  No known drug allergies.  PAST MEDICAL HISTORY:  This includes chronic atrial fibrillation and congestive heart failure, though specifics of her congestive heart failure are currently unknown.  She also has chronic  venous insufficiency with venous stasis ulcers.  She has had a remote tonsillectomy, she has had a colon polypectomy, and she has had uterine cysts.  There is no clear history of hypertension or diabetes.  SOCIAL HISTORY:  The patient is married.  She lives in [place].  She is retired from working at [employer].  She does not smoke cigarettes or drink alcohol and she is a lifetime nonsmoker.  FAMILY HISTORY:  The patient's father had congestive heart failure.  He had multivessel coronary bypass surgery at age 32.  REVIEW OF SYSTEMS:  Negative except as per HPI.  PHYSICAL EXAMINATION:  GENERAL:  The patient is alert and oriented.  She is a very pleasant woman in no acute distress.  She is obese. VITAL SIGNS:  Heart rate is 25 beats per minute, blood pressure is 90/40, respiratory rate 16, and oxygen saturation is 100% on 2 L of oxygen per nasal cannula. HEENT:  Normal. NECK:  Jugular venous pressure is normal.  No carotid bruits are present.  There is no thyromegaly. LUNGS:  Clear bilaterally. HEART:  Heart sounds are distant.  There is marked bradycardia.  The apex is not  palpable.  There are no murmurs or gallops. ABDOMEN:  Soft, obese, and nontender.  No organomegaly. BACK:  No CVA tenderness. EXTREMITIES:  There is 2+ bilateral pretibial edema.  The right lower leg is wrapped in an Ace bandage.  IMAGING:  EKG shows atrial fibrillation with marked bradycardia with slow ventricular rate.  There is left bundle-branch block morphology. The heart rate is about 25 beats per minute.  LABORATORY DATA:  Initial lab work shows a creatinine of 1.2 and a potassium of 4.8, other labs are currently pending.  FINAL ASSESSMENT:  Atrial fibrillation with slow ventricular response (marked bradycardia).  The patient has borderline hemodynamics and markedly slow heart rate.  Recommend emergency transvenous pacemaker placement.  Risks and indications of this procedure were reviewed with the  patient and she understands and agrees to proceed.  We will hold her beta-blocker and follow her heart rhythm carefully.  I suspect she will require permanent pacemaker.  We will wait initial blood work results and x-ray results to further treat her chronic congestive heart failure. We will check a 2-D echocardiogram.  Close clinical followup pending her initial response to a permanent pacemaker.     Veverly Fells. Excell Seltzer, MD     MDC/MEDQ  D:  08/12/2010  T:  08/13/2010  Job:  [id number]  cc:   Erasmo Downer, MD  Electronically Signed by Tonny Bollman MD on 08/15/2010 12:44:32 AM

## 2010-08-19 NOTE — Op Note (Signed)
[name], [name] NO.:  [id number]  MEDICAL RECORD NO.:  [id number]  LOCATION:  6529                         FACILITY:  [facility]  PHYSICIAN:  Hillis Range, MD       DATE OF BIRTH:  07/24/1933  DATE OF PROCEDURE: DATE OF DISCHARGE:                              OPERATIVE REPORT   SURGEON:  Hillis Range, MD  PREPROCEDURE DIAGNOSES: 1. Permanent atrial fibrillation. 2. Profound bradycardia with likely complete heart block.  POSTPROCEDURE DIAGNOSES: 1. Permanent atrial fibrillation. 2. Profound bradycardia with likely complete heart block.  PROCEDURES: 1. Left upper extremity venography. 2. Pacemaker implantation.  INTRODUCTION:  Ms. [name] is a pleasant 77 year old female who presents with symptoms of weakness, fatigue, and syncope.  Upon arrival to [facility], she was found to have profound bradycardia with atrial fibrillation and a very regularized RR left bundle-branch ventricular escape at 20 beats per minute.  She required urgent temporary transvenous pacing.  She now has no underlying rhythm.  Her beta-blocker has been held and she has not had return of conduction.  She therefore presents for pacemaker implantation.  DESCRIPTION OF PROCEDURE:  Informed written consent was obtained and the patient was brought to the electrophysiology lab in the fasting state. She was adequately sedated with intravenous Versed as outlined in the nursing report.  The patient's left chest was prepped and draped in the usual sterile fashion by the EP lab staff.  The skin overlying the left deltopectoral region was infiltrated with lidocaine for local analgesia. A 4-cm incision was made over the left deltopectoral region.  A left subcutaneous pacemaker pocket was fashioned using a combination of sharp and blunt dissection.  Electrocautery was required to assure hemostasis. A venogram of the left upper extremity was performed which revealed a moderate-sized left  axillary vein with a small left cephalic vein which both emptied into a moderate-sized left subclavian vein.  The left axillary vein was therefore cannulated with fluoroscopic visualization. Through the left axillary vein, a [employer] Medical IsoFlex model [id number] (serial [id number]) ventricular lead was advanced with fluoroscopic visualization into the right ventricular apex position.  In this location, R-waves with temporary pacing measured 30 mV.  There was no underlying R-waves without transvenous pacing.  The ventricular threshold was 0.8 volts at 0.4 milliseconds.  The ventricular impedance was 507 ohms.  The lead was therefore secured to the pectoralis fascia using #2 silk suture over the suture sleeve.  The temporary transvenous wire which was previously placed was then gently removed under fluoroscopic visualization to make sure that the newly implanted prominent lead was not dislodged.  Lead measurements were again performed from the prominent lead and were stable.  The lead was therefore connected to a [employer] Jude Medical [employer] [place], model [id number] (serial [id number]) pacemaker.  The pocket was irrigated with copious gentamicin solution.  The device was then secured to the pectoralis fascia with a single #2 silk suture.  The pocket was then closed in two layers with 2.0 Vicryl suture for the subcutaneous and subcuticular layers.  Steri-Strips and the sterile dressing were then applied.  There were no early apparent complications.  CONCLUSIONS: 1. Successful pacemaker implantation for symptomatic bradycardia in  the setting of permanent atrial fibrillation with complete heart     block. 2. No early apparent complications.     Hillis Range, MD     JA/MEDQ  D:  08/13/2010  T:  08/14/2010  Job:  [id number]  cc:   Verne Carrow, MD Erasmo Downer, MD  Electronically Signed by Hillis Range MD on 08/19/2010 10:00:48 AM

## 2010-08-19 NOTE — Consult Note (Signed)
[name], [name] NO.:  [id number]  MEDICAL RECORD NO.:  [id number]  LOCATION:  6529                         FACILITY:  [facility]  PHYSICIAN:  Hillis Range, MD       DATE OF BIRTH:  10/22/1933  DATE OF CONSULTATION: DATE OF DISCHARGE:                                CONSULTATION   REQUESTING PHYSICIAN:  Verne Carrow, MD  REASON FOR CONSULTATION:  Bradycardia.  HISTORY OF PRESENT ILLNESS:  [name] is a pleasant 77 year old female with a history of peripheral vascular disease with chronic venous insufficiency, diastolic dysfunction, and permanent atrial fibrillation who was admitted with symptomatic bradycardia.  The patient reports that over the past 2 weeks she has had progressive symptoms of dizziness and weakness.  She also reports that she has had at least 2 falls over the past few weeks.  She reports that she has been weak and "collapsed." She is not clear as to whether or not she had loss of consciousness. She therefore was brought to [facility] where she was found to have no clear etiology per report.  Her symptoms progressed and therefore she was brought to [facility] where she was found to have marked bradycardia with heart rates in the 20s.  She did not respond to atropine.  She therefore required emergent temporary transvenous pacing.  With transvenous pacing, her symptoms have been improved.  She has previously been treated with Coreg for tachycardias related to her atrial fibrillation, as well as hypertension.  Her Coreg has been held, however, she continues to have profound bradycardia with no underlying escape with pacing at 30 beats per minute with a temporary pacing device.  No reversible causes for her bradycardia have been found.  EP is therefore consulted for possible pacemaker implantation.  The patient is chronically debilitated.  She reports chronic leg edema and has had difficulties with venous ulcers.   She also reports chronic shortness of breath and orthopnea.  She sleeps in a chair chronically.  PAST MEDICAL HISTORY: 1. Venous insufficiency with venous ulcers. 2. Peripheral vascular disease.  She previously underwent an     arteriogram which revealed no significant arterial occlusive     disease although she has had minimal stenosis of the right distal     common femoral artery extending into the deep femoral artery. 3. Diastolic dysfunction. 4. Permanent atrial fibrillation. 5. Obesity. 6. Debility chronically. 7. Pulmonary hypertension. 8. History of rheumatoid arthritis for which she received chronic     prednisone therapy in the past. 9. Obstructive sleep apnea previously requiring CPAP. 10.Hypertension.  MEDICATIONS:  Reviewed in the [facility].  ALLERGIES:  No known drug allergies.  SOCIAL HISTORY:  The patient lives with her spouse.  She denies tobacco, alcohol or drug use.  FAMILY HISTORY:  Her mother has Alzheimer's.  REVIEW OF SYSTEMS:  All systems reviewed are negative except as outlined in the HPI above.  Telemetry reveals atrial fibrillation with ventricular pacing at 60 beats per minute.  PHYSICAL EXAMINATION:  VITAL SIGNS:  Afebrile.  Blood pressure 134/74, heart rate 60, respirations 17, sats 97% on 2 liters. GENERAL:  The patient is a chronically ill and debilitated obese female  in no acute distress.  She is alert and oriented x3. HEENT:  Normocephalic, atraumatic.  Sclerae clear.  Conjunctivae pink. Oropharynx clear. NECK:  Supple.  JVP 11 cm. LUNGS:  Bibasilar rales. HEART:  Bradycardic, regular rhythm, 2/6 systolic ejection murmur along the apex. GI:  Soft, nontender, nondistended.  Positive bowel sounds. EXTREMITIES:  No clubbing, cyanosis.  She does have 2+ bilateral pretibial edema and both legs are wrapped in Ace bandage with dressings intact. SKIN:  No ecchymoses or lacerations but she does have chronic venous stasis changes. PSYCH:  Euthymic  mood.  Full affect. NEURO:  Strength and sensation are intact.  IMAGING:  EKG:  Her initial strip from EMS was reviewed and reveals atrial fibrillation with a very regular left bundle-branch ventricular escape rhythm at 20 beats per minute.  She also has an EKG from today which reveals an atrial fibrillation with ventricular pacing at 60 beats per minute.  Echocardiogram:  I have discussed the echocardiogram findings at length with Dr. Patty Sermons and he feels that the patient's ejection fraction is 50-55%.  She has mild to moderate aortic stenosis with mild-to-moderate mitral regurgitation.  The patient's atria are both moderately dilated and she has moderate tricuspid regurgitation.  She also has a PA peak pressure of 63 mmHg.  LABORATORY DATA:  Potassium 4.3, creatinine 0.7.  TSH 3.2, T4 1.0. White blood cell count 12,000, hematocrit 38, platelets 186.  Cardiac markers are negative.  Chest from 2008, is reviewed which reveals a hyperinflation and similar to prior studies.  IMPRESSION:  [name] is a very pleasant 77 year old female who now presents with symptomatic bradycardia.  She has permanent atrial fibrillation, as well as diastolic dysfunction chronically and is quite debilitated.  She now has had multiple episodes of syncope which are due to profound bradycardia.  Her EKG is suggestive of complete heart block as she has atrial fibrillation with very regular RR intervals.  She now is ventricular paced through a temporary pacing wire and has no underlying escape.  Her carvedilol has been held and her conduction has not improved.  I do not see any reversible causes for bradycardia at this time.  PLAN:  I had a long discussion with the patient today regarding therapeutic options.  Risks, benefits and alternatives to pacemaker implantation were also discussed at length.  At this time, the patient would like to proceed with pacemaker implantation.  I think that this  is the most appropriate treatment option.  We will therefore proceed with pacemaker implantation at the next available time.     Hillis Range, MD     JA/MEDQ  D:  08/13/2010  T:  08/14/2010  Job:  [id number]  cc:   Verne Carrow, MD  Electronically Signed by Hillis Range MD on 08/19/2010 10:00:42 AM

## 2010-08-26 ENCOUNTER — Ambulatory Visit (INDEPENDENT_AMBULATORY_CARE_PROVIDER_SITE_OTHER): Payer: Medicare PPO | Admitting: *Deleted

## 2010-08-26 DIAGNOSIS — I4891 Unspecified atrial fibrillation: Secondary | ICD-10-CM

## 2010-08-26 DIAGNOSIS — I442 Atrioventricular block, complete: Secondary | ICD-10-CM

## 2010-08-26 NOTE — Progress Notes (Signed)
Pacer check in clinic  

## 2010-09-19 NOTE — Discharge Summary (Signed)
NAMEMarland Kitchen  Colleen, Hall NO.:  [id number]  MEDICAL RECORD NO.:  [id number]  LOCATION:  6529                         FACILITY:  [facility]  PHYSICIAN:  Hillis Range, MD       DATE OF BIRTH:  [birth year]  DATE OF ADMISSION:  08/12/2010 DATE OF DISCHARGE:  08/14/2010                              DISCHARGE SUMMARY   PRIMARY CARDIOLOGIST:  Hillis Range, MD.  PRIMARY CARE PROVIDER:  Delaney Meigs, MD  DISCHARGE DIAGNOSIS:  Syncope.  SECONDARY DIAGNOSES: 1. Symptomatic bradycardia requiring pacemaker placement. 2. Atrial fibrillation with slow ventricular response. 3. Chronic diastolic congestive heart failure. 4. Chronic venous insufficiency with venous stasis ulcers. 5. Status post colon polypectomy. 6. History of uterine cysts. 7. Status post tonsillectomy.  ALLERGIES:  No known drug allergies.  PROCEDURES: 1. Two-D echocardiogram performed on August 13, 2010 showing an EF of 50-     55% with mid to distal anteroseptal hypokinesis.  Mild to moderate     aortic stenosis and trivial aortic insufficiency.  Mild to moderate     mitral regurgitation.  Mildly dilated left atrium.  Mildly dilated     right ventricle.  Mildly dilated right atrium.  Moderate tricuspid     regurgitation.  PASP 63 mmHg. 2. Successful placement of a temporary pacing wire on August 12, 2010. 3. Successful placement of a [employer] SR RF single lead     permanent pacemaker, model number [id number], serial number [id number].  HISTORY OF PRESENT ILLNESS:  A 77 year old female with a history of chronic atrial fibrillation, who was previously been felt to be a poor Coumadin candidate.  She was in her usual state of health until approximately 2 weeks prior to admission when she began to experience intermittent dizziness and weakness and at least two episode of syncope. On the day of admission, due to progressive symptoms, EMS was called and the patient was found to have a heart rate of  approximately 25 beats per minute.  She was taken to the [facility] ED where she was found to be in AFib with a slow ventricular response with rates in the 20s.  She was admitted for further evaluation.  HOSPITAL COURSE:  Because of symptomatic bradycardia, the patient was taken immediately for temporary pacing wire placement.  Her beta-blocker was held and even off beta-blocker, the patient remained pacer dependent.  She was seen by Electrophysiology on August 13, 2010 and it was felt that she would require permanent pacemaker placement.  A 2-D echocardiogram was performed on August 13, 2010 showing an EF of 50-55% as outlined above.  The patient was then taken to the Electrophysiology Lab where she underwent successful placement of a [employer] Accent SR RF single lead permanent pacemaker.  She tolerated this procedure well and postprocedure has had no recurrent presyncope or syncope.  Postop chest x-ray shows no evidence of pneumothorax.  The patient has been seen by physical and occupational therapy with recommendation for home health PT, which will be arranged prior to discharge.  She will be discharged home this afternoon in good condition.  DISCHARGE LABS:  Hemoglobin of 12.4, hematocrit 38.4, WBC 12.2, platelets 186.  Sodium 142, potassium 4.8, chloride 107, CO2 of 24, BUN 20, creatinine 0.76, glucose 108.  Total bilirubin 0.3, alkaline phosphatase 69, AST 12, ALT 8, protein 6.5, albumin 3.2, calcium 8.6. CK 19, MB 1.5, troponin I less than 0.30.  BNP 4343.  TSH 3.256, free T4 of 1.01.  Urinalysis was negative.  MRSA screen was negative.  Urine culture showed no growth.  DISPOSITION:  The patient will be discharged home today in good condition.  FOLLOWUP PLANS AND APPOINTMENTS:  The patient will follow up with [facility] in our [place] office on August 26, 2010 at 12 p.m.  She will follow up with Dr. Lysbeth Galas in the next 3-4 weeks and with Dr. Johney Frame  in our [facility] office on November 08, 2010 at 2:15 p.m.  DISCHARGE MEDICATIONS: 1. Aspirin 325 mg daily. 2. Carvedilol 25 mg half a tab b.i.d. 3. Lasix 20 mg daily. 4. Losartan 25 mg daily. 5. Simvastatin 20 mg at bedtime. 6. Tylenol 650 mg 2 caps b.i.d. p.r.n.  OUTSTANDING LABS AND STUDIES:  None.  DURATION OF DISCHARGE ENCOUNTER:  Forty minutes including physician time.     [name], ANP   ______________________________ Hillis Range, MD    CB/MEDQ  D:  08/14/2010  T:  08/14/2010  Job:  [id number]  cc:   Delaney Meigs, M.D.  Electronically Signed by [name] ANP on 08/19/2010 10:37:27 AM Electronically Signed by Hillis Range MD on 09/19/2010 09:40:36 AM

## 2010-09-23 ENCOUNTER — Telehealth: Payer: Self-pay | Admitting: Internal Medicine

## 2010-09-23 NOTE — Telephone Encounter (Signed)
Pt wants to go to [facility] to get her dentures, and dtr thinks she needs a cardiac clearance letter, dtr will pick up, will be in town on Wednesday pm if can pick up then, pls call

## 2010-09-23 NOTE — Telephone Encounter (Signed)
Will be glad to write a letter for her  Will need to know what they will do and what kind of anesthesia they are going to use prior to writing the letter  Patients daughter aware

## 2010-10-04 ENCOUNTER — Telehealth: Payer: Self-pay | Admitting: Internal Medicine

## 2010-10-04 NOTE — Telephone Encounter (Signed)
Pt daughter calling needing to get cardiac clearance for pt to get three to four teeth pulled under local anesthetic. Dr. Rogelio Seen with [facility] would the MD performing the tooth extraction. Please return call to discuss further.

## 2010-10-07 NOTE — Telephone Encounter (Signed)
Pt daughter calling regarding needing surgical clearance for local anesthetic to get teeth pulled. Please call back to discuss.

## 2010-10-07 NOTE — Telephone Encounter (Signed)
Not scheduled yet  Will only do 3-4 at a time  Will call daughter back when ready

## 2010-10-09 NOTE — Telephone Encounter (Signed)
Daughter aware and will pick up tomorrow or Friday

## 2010-10-09 NOTE — Telephone Encounter (Signed)
Proceed with surgery if medically necessary. Hold coumadin 5 days prior to the procedure and restart afterwards

## 2010-11-08 ENCOUNTER — Encounter: Payer: 59 | Admitting: Internal Medicine

## 2010-11-11 LAB — URINALYSIS, ROUTINE W REFLEX MICROSCOPIC
Glucose, UA: NEGATIVE
Hgb urine dipstick: NEGATIVE
Leukocytes, UA: NEGATIVE
pH: 5

## 2010-11-11 LAB — URINE MICROSCOPIC-ADD ON

## 2010-12-12 ENCOUNTER — Encounter: Payer: Self-pay | Admitting: Internal Medicine

## 2010-12-12 ENCOUNTER — Ambulatory Visit (INDEPENDENT_AMBULATORY_CARE_PROVIDER_SITE_OTHER): Payer: Medicare PPO | Admitting: Internal Medicine

## 2010-12-12 DIAGNOSIS — I5189 Other ill-defined heart diseases: Secondary | ICD-10-CM | POA: Insufficient documentation

## 2010-12-12 DIAGNOSIS — I442 Atrioventricular block, complete: Secondary | ICD-10-CM

## 2010-12-12 DIAGNOSIS — I4891 Unspecified atrial fibrillation: Secondary | ICD-10-CM

## 2010-12-12 DIAGNOSIS — I519 Heart disease, unspecified: Secondary | ICD-10-CM

## 2010-12-12 DIAGNOSIS — I4821 Permanent atrial fibrillation: Secondary | ICD-10-CM | POA: Insufficient documentation

## 2010-12-12 DIAGNOSIS — I872 Venous insufficiency (chronic) (peripheral): Secondary | ICD-10-CM | POA: Insufficient documentation

## 2010-12-12 LAB — PACEMAKER DEVICE OBSERVATION
BRDY-0002RV: 60 {beats}/min
BRDY-0004RV: 120 {beats}/min
DEVICE MODEL PM: 7245638

## 2010-12-12 NOTE — Assessment & Plan Note (Signed)
Normal pacemaker function See Pace Art report No changes today  

## 2010-12-12 NOTE — Assessment & Plan Note (Signed)
She reports bleeding with coumadin in the past,  She is not willing to retry coumadin  No changes today

## 2010-12-12 NOTE — Progress Notes (Signed)
The patient presents today for routine electrophysiology followup.  Since last being seen in our clinic, the patient reports doing reasonably well. She is chronically debilitated with multiple comorbidities (see below).  She is on home O2.  SHe has significant venous wounds/ ulcers.  Today, she denies symptoms of palpitations, chest pain,  dizziness, presyncope, syncope, or neurologic sequela.  The patient feels that she is tolerating medications without difficulties and is otherwise without complaint today.   Past Medical History  Diagnosis Date  . Permanent atrial fibrillation   . Complete heart block     s/p PPM by Dr Johney Frame  . Diastolic dysfunction   . Venous insufficiency     with chronic leg ulcers  . Obesity   . Pulmonary hypertension   . Chronic lung disease     on home O2  . Debilitated   . Hypertension   . Sleep apnea   . Rheumatoid arthritis    Past Surgical History  Procedure Date  . Pacemaker insertion 08/13/10    SJM by Dr Johney Frame    Current Outpatient Prescriptions  Medication Sig Dispense Refill  . acetaminophen (TYLENOL) 650 MG CR tablet Take 1,300 mg by mouth 2 (two) times daily as needed.        Marland Kitchen aspirin 325 MG tablet Take 325 mg by mouth daily.        . carvedilol (COREG) 25 MG tablet Take 12.5 mg by mouth 2 (two) times daily with a meal.        . furosemide (LASIX) 20 MG tablet Take 1 tablet every other day alternating with 1 &1/2 every other day      . levothyroxine (SYNTHROID, LEVOTHROID) 75 MCG tablet Take 75 mcg by mouth daily.        Marland Kitchen losartan (COZAAR) 100 MG tablet Take 100 mg by mouth daily.          No Known Allergies  History   Social History  . Marital Status: Married    Spouse Name: N/A    Number of Children: N/A  . Years of Education: N/A   Occupational History  . Not on file.   Social History Main Topics  . Smoking status: Never Smoker   . Smokeless tobacco: Never Used  . Alcohol Use: No  . Drug Use: Not on file  . Sexually  Active: Not on file   Other Topics Concern  . Not on file   Social History Narrative  . No narrative on file    No family history on file.  ROS-  All systems are reviewed and are negative except as outlined in the HPI above   Physical Exam: Filed Vitals:   12/12/10 1554  BP: 119/73  Pulse: 60  Height: 4\' 11"  (1.499 m)  Weight: 175 lb (79.379 kg)  SpO2: 97%    GEN- The patient is obese and chronically ill appearing, alert and oriented x 3 today.   Head- normocephalic, atraumatic Eyes-  Sclera clear, conjunctiva pink Ears- hearing intact Oropharynx- clear Neck- supple, no JVP Lymph- no cervical lymphadenopathy Lungs- prolonged expiratory phase, wearing O2, normal WOB Chest- pacemaker pocket is well healed Heart- Regular rate and rhythm (paced) GI- soft, NT, ND, + BS Extremities- no clubbing, cyanosis, 3+ edema with venous stasis changes/ wounds  Pacemaker interrogation- reviewed in detail today,  See PACEART report  Assessment and Plan:

## 2010-12-12 NOTE — Assessment & Plan Note (Signed)
Significant bilateral leg wounds I have encouraged her to follow closely in the wound clinic (She is known to them) to prevent bacteremia/ possible pacing system infection in the future  No changes today

## 2010-12-12 NOTE — Assessment & Plan Note (Signed)
Stable No change required today  

## 2011-08-14 ENCOUNTER — Ambulatory Visit (INDEPENDENT_AMBULATORY_CARE_PROVIDER_SITE_OTHER): Payer: Medicare PPO | Admitting: Internal Medicine

## 2011-08-14 ENCOUNTER — Encounter: Payer: Self-pay | Admitting: Internal Medicine

## 2011-08-14 VITALS — BP 103/68 | HR 78 | Ht 59.0 in | Wt 143.0 lb

## 2011-08-14 DIAGNOSIS — I4821 Permanent atrial fibrillation: Secondary | ICD-10-CM

## 2011-08-14 DIAGNOSIS — I4891 Unspecified atrial fibrillation: Secondary | ICD-10-CM

## 2011-08-14 DIAGNOSIS — I442 Atrioventricular block, complete: Secondary | ICD-10-CM

## 2011-08-14 DIAGNOSIS — I872 Venous insufficiency (chronic) (peripheral): Secondary | ICD-10-CM

## 2011-08-14 LAB — PACEMAKER DEVICE OBSERVATION
BRDY-0002RV: 60 {beats}/min
BRDY-0004RV: 120 {beats}/min
DEVICE MODEL PM: 7245638
RV LEAD IMPEDENCE PM: 650 Ohm

## 2011-08-14 NOTE — Patient Instructions (Addendum)
Your physician recommends that you schedule a follow-up appointment in: 1 year with Dr. Johney Frame. You will receive a reminder letter in the mail in about 10 months reminding you to call and schedule your appointment. If you don't receive this letter, please contact our office. Device check November 17, 2011. Your physician recommends that you continue on your current medications as directed. Please refer to the Current Medication list given to you today.

## 2011-08-14 NOTE — Assessment & Plan Note (Signed)
Per Dr Lysbeth Galas

## 2011-08-14 NOTE — Assessment & Plan Note (Signed)
Normal pacemaker function See Pace Art report No changes today  [facility] transmissions every 3 months, return in 1 year

## 2011-08-14 NOTE — Progress Notes (Signed)
PCP: Josue Hector, MD  The patient presents today for routine electrophysiology followup.  She appears stable. She is chronically debilitated with multiple comorbidities (see below).  She is on home O2.  SHe has significant venous wounds/ ulcers chronically and has been recently hospitalized for this, requiring IV antibiotics.  Today, she denies symptoms of palpitations, chest pain,  dizziness, presyncope, syncope, or neurologic sequela.  The patient feels that she is tolerating medications without difficulties and is otherwise without complaint today.   Past Medical History  Diagnosis Date  . Permanent atrial fibrillation   . Complete heart block     s/p PPM by Dr Johney Frame  . Diastolic dysfunction   . Venous insufficiency     with chronic leg ulcers  . Obesity   . Pulmonary hypertension   . Chronic lung disease     on home O2  . Debilitated   . Hypertension   . Sleep apnea   . Rheumatoid arthritis    Past Surgical History  Procedure Date  . Pacemaker insertion 08/13/10    SJM by Dr Johney Frame    Current Outpatient Prescriptions  Medication Sig Dispense Refill  . aspirin 325 MG tablet Take 325 mg by mouth daily.        . carvedilol (COREG) 25 MG tablet Take 25 mg by mouth daily.       . ferrous sulfate 325 (65 FE) MG tablet Take 325 mg by mouth daily with breakfast.      . furosemide (LASIX) 20 MG tablet Take 20 mg by mouth daily.       Marland Kitchen levothyroxine (SYNTHROID, LEVOTHROID) 75 MCG tablet Take 75 mcg by mouth daily.        Marland Kitchen losartan (COZAAR) 100 MG tablet Take 100 mg by mouth daily.        Marland Kitchen oxyCODONE-acetaminophen (PERCOCET) 7.5-325 MG per tablet Take 1 tablet by mouth every 6 (six) hours as needed.       . simvastatin (ZOCOR) 20 MG tablet Take 20 mg by mouth daily.       . Zinc 50 MG CAPS Take 50 mg by mouth daily.        No Known Allergies  History   Social History  . Marital Status: Married    Spouse Name: N/A    Number of Children: N/A  . Years of Education:  N/A   Occupational History  . Not on file.   Social History Main Topics  . Smoking status: Never Smoker   . Smokeless tobacco: Never Used  . Alcohol Use: No  . Drug Use: Not on file  . Sexually Active: Not on file   Other Topics Concern  . Not on file   Social History Narrative  . No narrative on file   Physical Exam: Filed Vitals:   08/14/11 0954  BP: 103/68  Pulse: 78  Height: 4\' 11"  (1.499 m)  Weight: 143 lb (64.864 kg)  SpO2: 99%    GEN- The patient is obese and chronically ill appearing, alert and oriented x 3 today.   Head- normocephalic, atraumatic Eyes-  Sclera clear, conjunctiva pink Ears- hearing intact Oropharynx- clear Neck- supple, no JVP Lymph- no cervical lymphadenopathy Lungs- prolonged expiratory phase, wearing O2, normal WOB Chest- pacemaker pocket is well healed Heart- Regular rate and rhythm (paced) GI- soft, NT, ND, + BS Extremities- no clubbing, cyanosis, 3+ edema with venous stasis changes/ wounds  Pacemaker interrogation- reviewed in detail today,  See PACEART report  Assessment and Plan:

## 2011-08-14 NOTE — Assessment & Plan Note (Signed)
Rate controlled Poor coumadin candidate due to prior bleeding,  She is clear that she will not retry anticoagulation at this time

## 2011-11-17 ENCOUNTER — Encounter: Payer: Medicare PPO | Admitting: *Deleted

## 2011-11-17 ENCOUNTER — Ambulatory Visit (INDEPENDENT_AMBULATORY_CARE_PROVIDER_SITE_OTHER): Payer: Medicare PPO | Admitting: *Deleted

## 2011-11-17 ENCOUNTER — Encounter: Payer: Self-pay | Admitting: Internal Medicine

## 2011-11-17 DIAGNOSIS — Z950 Presence of cardiac pacemaker: Secondary | ICD-10-CM

## 2011-11-17 DIAGNOSIS — I442 Atrioventricular block, complete: Secondary | ICD-10-CM

## 2011-11-18 ENCOUNTER — Encounter: Payer: Self-pay | Admitting: *Deleted

## 2011-11-25 ENCOUNTER — Encounter: Payer: Self-pay | Admitting: *Deleted

## 2011-11-25 DIAGNOSIS — Z950 Presence of cardiac pacemaker: Secondary | ICD-10-CM | POA: Insufficient documentation

## 2011-11-25 LAB — REMOTE PACEMAKER DEVICE
RV LEAD AMPLITUDE: 6.4 mv
RV LEAD THRESHOLD: 0.875 V
VENTRICULAR PACING PM: 59

## 2011-11-26 ENCOUNTER — Telehealth: Payer: Self-pay | Admitting: Internal Medicine

## 2011-11-26 NOTE — Telephone Encounter (Signed)
plz return call to patient hm# regarding no show remote transmission 11/17/11

## 2011-11-27 NOTE — Telephone Encounter (Signed)
Spoke w/pt in regards to sending transmission. Transmission was received. Pt aware of this.

## 2011-12-23 ENCOUNTER — Encounter: Payer: Self-pay | Admitting: *Deleted

## 2012-02-02 ENCOUNTER — Telehealth: Payer: Self-pay | Admitting: Internal Medicine

## 2012-02-02 NOTE — Telephone Encounter (Signed)
Patient called regarding her pacemaker going off.  She states she is hearing a loud beep in her left ear.

## 2012-02-02 NOTE — Telephone Encounter (Signed)
Patient said her device went off twice with short beeps.No bad feeling or symptoms  Patient wanted to know what this means?

## 2012-02-02 NOTE — Telephone Encounter (Signed)
Transmission received. No alerts on device/kwm

## 2012-02-02 NOTE — Telephone Encounter (Signed)
Spoke w/pt and advised to send transmission manually. Waiting on transmission to be received.

## 2012-03-01 ENCOUNTER — Encounter: Payer: Self-pay | Admitting: Internal Medicine

## 2012-03-01 ENCOUNTER — Telehealth: Payer: Self-pay | Admitting: Internal Medicine

## 2012-03-01 ENCOUNTER — Ambulatory Visit (INDEPENDENT_AMBULATORY_CARE_PROVIDER_SITE_OTHER): Payer: 59 | Admitting: *Deleted

## 2012-03-01 DIAGNOSIS — I442 Atrioventricular block, complete: Secondary | ICD-10-CM

## 2012-03-01 DIAGNOSIS — Z950 Presence of cardiac pacemaker: Secondary | ICD-10-CM

## 2012-03-01 LAB — REMOTE PACEMAKER DEVICE
BRDY-0002RV: 60 {beats}/min
BRDY-0004RV: 120 {beats}/min
RV LEAD IMPEDENCE PM: 630 Ohm
RV LEAD THRESHOLD: 0.875 V

## 2012-03-01 NOTE — Telephone Encounter (Signed)
Transmission was received/kwm  

## 2012-03-01 NOTE — Telephone Encounter (Signed)
New Problem    Would like to speak to someone about checking her pace maker.

## 2012-03-01 NOTE — Telephone Encounter (Signed)
Pt sending transmission thru [facility] when pt was called.

## 2012-03-16 ENCOUNTER — Encounter: Payer: Self-pay | Admitting: *Deleted

## 2012-05-31 ENCOUNTER — Ambulatory Visit (INDEPENDENT_AMBULATORY_CARE_PROVIDER_SITE_OTHER): Payer: 59 | Admitting: *Deleted

## 2012-05-31 ENCOUNTER — Encounter: Payer: Self-pay | Admitting: Internal Medicine

## 2012-05-31 ENCOUNTER — Other Ambulatory Visit: Payer: Self-pay | Admitting: Internal Medicine

## 2012-05-31 DIAGNOSIS — I442 Atrioventricular block, complete: Secondary | ICD-10-CM

## 2012-05-31 DIAGNOSIS — Z950 Presence of cardiac pacemaker: Secondary | ICD-10-CM

## 2012-05-31 LAB — REMOTE PACEMAKER DEVICE
BATTERY VOLTAGE: 2.98 V
BRDY-0002RV: 60 {beats}/min
DEVICE MODEL PM: 7245638
VENTRICULAR PACING PM: 100

## 2012-06-16 ENCOUNTER — Encounter: Payer: Self-pay | Admitting: *Deleted

## 2012-08-11 ENCOUNTER — Ambulatory Visit (INDEPENDENT_AMBULATORY_CARE_PROVIDER_SITE_OTHER): Payer: 59 | Admitting: Internal Medicine

## 2012-08-11 ENCOUNTER — Encounter: Payer: Self-pay | Admitting: Internal Medicine

## 2012-08-11 VITALS — BP 130/64 | HR 60 | Ht 59.0 in | Wt 140.0 lb

## 2012-08-11 DIAGNOSIS — I872 Venous insufficiency (chronic) (peripheral): Secondary | ICD-10-CM

## 2012-08-11 DIAGNOSIS — I442 Atrioventricular block, complete: Secondary | ICD-10-CM

## 2012-08-11 DIAGNOSIS — I4821 Permanent atrial fibrillation: Secondary | ICD-10-CM

## 2012-08-11 DIAGNOSIS — I4891 Unspecified atrial fibrillation: Secondary | ICD-10-CM

## 2012-08-11 DIAGNOSIS — Z950 Presence of cardiac pacemaker: Secondary | ICD-10-CM

## 2012-08-11 LAB — PACEMAKER DEVICE OBSERVATION
BRDY-0002RV: 60 {beats}/min
BRDY-0004RV: 120 {beats}/min
RV LEAD IMPEDENCE PM: 687.5 Ohm
RV LEAD THRESHOLD: 0.75 V

## 2012-08-11 NOTE — Progress Notes (Signed)
PCP: Rudi Heap, MD  The patient presents today for routine electrophysiology followup.  She appears stable. She is chronically debilitated with multiple comorbidities (see below).  She appears stable since her last visit.  Today, she denies symptoms of palpitations, chest pain,  dizziness, presyncope, syncope, or neurologic sequela.  The patient feels that she is tolerating medications without difficulties and is otherwise without complaint today.   Past Medical History  Diagnosis Date  . Permanent atrial fibrillation   . Complete heart block     s/p PPM by Dr Johney Frame  . Diastolic dysfunction   . Venous insufficiency     with chronic leg ulcers  . Obesity   . Pulmonary hypertension   . Chronic lung disease     on home O2  . Debilitated   . Hypertension   . Sleep apnea   . Rheumatoid arthritis(714.0)    Past Surgical History  Procedure Laterality Date  . Pacemaker insertion  08/13/10    SJM by Dr Johney Frame    Current Outpatient Prescriptions  Medication Sig Dispense Refill  . aspirin 325 MG tablet Take 325 mg by mouth daily.        . carvedilol (COREG) 25 MG tablet Take 25 mg by mouth daily.       . ferrous sulfate 325 (65 FE) MG tablet Take 325 mg by mouth daily with breakfast.      . furosemide (LASIX) 20 MG tablet Take 20 mg by mouth daily.       Marland Kitchen losartan (COZAAR) 100 MG tablet Take 100 mg by mouth daily.        Marland Kitchen oxyCODONE-acetaminophen (PERCOCET) 7.5-325 MG per tablet Take 1 tablet by mouth every 6 (six) hours as needed.       . simvastatin (ZOCOR) 20 MG tablet Take 20 mg by mouth daily.       . Zinc 50 MG CAPS Take 50 mg by mouth daily.       No current facility-administered medications for this visit.    No Known Allergies  History   Social History  . Marital Status: Married    Spouse Name: N/A    Number of Children: N/A  . Years of Education: N/A   Occupational History  . Not on file.   Social History Main Topics  . Smoking status: Never Smoker   .  Smokeless tobacco: Never Used  . Alcohol Use: No  . Drug Use: Not on file  . Sexually Active: Not on file   Other Topics Concern  . Not on file   Social History Narrative  . No narrative on file   Physical Exam: Filed Vitals:   08/11/12 1427  BP: 130/64  Pulse: 60  Height: 4\' 11"  (1.499 m)  Weight: 140 lb (63.504 kg)  SpO2: 94%    GEN- The patient is obese and chronically ill appearing, alert and oriented x 3 today.   Head- normocephalic, atraumatic Eyes-  Sclera clear, conjunctiva pink Ears- hearing intact Oropharynx- clear Neck- supple, no JVP Lymph- no cervical lymphadenopathy Lungs- prolonged expiratory phase, wearing O2, normal WOB Chest- pacemaker pocket is well healed Heart- Regular rate and rhythm (paced) GI- soft, NT, ND, + BS Extremities- no clubbing, cyanosis, 3+ edema with venous stasis changes/ wounds  Pacemaker interrogation- reviewed in detail today,  See PACEART report  Assessment and Plan:  1. Permanent afib I am concerned about risks for stroke.  She has not tolerated anticoagulation previously.  Today I discussed Eliquis which was  compared to ASA in the AVEREOS trial.  I have strongly recommended eliquis, however at this time she declines.  2. Complete heart block Normal pacemaker function See Pace Art report No changes today  3. Chronic venous insufficiency Stable No change required today   [facility] Return in 1year

## 2012-08-11 NOTE — Patient Instructions (Addendum)
Your physician recommends that you schedule a follow-up appointment in: 1 year with Dr. Johney Frame. You should receive a letter in 10 months to schedule this appointment. If you do not receive this letter by May 2015 call our office and schedule this appointment.  Continue all current medications refer to the list given to you today.

## 2012-08-23 ENCOUNTER — Encounter: Payer: Self-pay | Admitting: Internal Medicine

## 2012-11-15 ENCOUNTER — Ambulatory Visit (INDEPENDENT_AMBULATORY_CARE_PROVIDER_SITE_OTHER): Payer: 59 | Admitting: *Deleted

## 2012-11-15 DIAGNOSIS — Z950 Presence of cardiac pacemaker: Secondary | ICD-10-CM

## 2012-11-15 DIAGNOSIS — I442 Atrioventricular block, complete: Secondary | ICD-10-CM

## 2012-11-16 LAB — REMOTE PACEMAKER DEVICE
BMOD-0002RV: 10
DEVICE MODEL PM: 7245638
VENTRICULAR PACING PM: 98

## 2012-11-22 ENCOUNTER — Encounter: Payer: Self-pay | Admitting: *Deleted

## 2012-12-02 ENCOUNTER — Encounter: Payer: Self-pay | Admitting: Internal Medicine

## 2013-02-14 ENCOUNTER — Encounter: Payer: Commercial Managed Care - HMO | Admitting: *Deleted

## 2013-02-14 DIAGNOSIS — I4821 Permanent atrial fibrillation: Secondary | ICD-10-CM

## 2013-02-14 DIAGNOSIS — I442 Atrioventricular block, complete: Secondary | ICD-10-CM

## 2013-02-14 LAB — MDC_IDC_ENUM_SESS_TYPE_REMOTE
Battery Remaining Longevity: 135 mo
Brady Statistic RV Percent Paced: 98 %
Implantable Pulse Generator Model: 1210
Lead Channel Impedance Value: 700 Ohm
Lead Channel Setting Pacing Pulse Width: 0.4 ms
MDC IDC MSMT BATTERY VOLTAGE: 2.99 V
MDC IDC MSMT LEADCHNL RV PACING THRESHOLD AMPLITUDE: 0.75 V
MDC IDC MSMT LEADCHNL RV PACING THRESHOLD PULSEWIDTH: 0.4 ms
MDC IDC MSMT LEADCHNL RV SENSING INTR AMPL: 6.7 mV
MDC IDC PG SERIAL: 7245638
MDC IDC SESS DTM: 20150119070020
MDC IDC SET LEADCHNL RV PACING AMPLITUDE: 2.5 V
MDC IDC SET LEADCHNL RV SENSING SENSITIVITY: 3 mV

## 2013-02-22 ENCOUNTER — Encounter: Payer: Self-pay | Admitting: *Deleted

## 2013-02-25 ENCOUNTER — Encounter: Payer: Self-pay | Admitting: Internal Medicine

## 2013-05-02 ENCOUNTER — Other Ambulatory Visit: Payer: Self-pay | Admitting: *Deleted

## 2013-05-05 ENCOUNTER — Encounter (HOSPITAL_COMMUNITY): Payer: Self-pay | Admitting: Emergency Medicine

## 2013-05-05 ENCOUNTER — Encounter: Payer: Self-pay | Admitting: Family Medicine

## 2013-05-05 ENCOUNTER — Ambulatory Visit (INDEPENDENT_AMBULATORY_CARE_PROVIDER_SITE_OTHER): Payer: Commercial Managed Care - HMO | Admitting: Family Medicine

## 2013-05-05 ENCOUNTER — Inpatient Hospital Stay (HOSPITAL_COMMUNITY)
Admission: EM | Admit: 2013-05-05 | Discharge: 2013-05-06 | DRG: 603 | Disposition: A | Discharge status: Home Health | Payer: Medicare HMO | Attending: Internal Medicine | Admitting: Internal Medicine

## 2013-05-05 ENCOUNTER — Telehealth: Payer: Self-pay | Admitting: *Deleted

## 2013-05-05 ENCOUNTER — Encounter (INDEPENDENT_AMBULATORY_CARE_PROVIDER_SITE_OTHER): Payer: Self-pay

## 2013-05-05 VITALS — BP 140/78 | HR 60 | Temp 97.9°F | Ht 59.0 in | Wt 149.0 lb

## 2013-05-05 DIAGNOSIS — I2789 Other specified pulmonary heart diseases: Secondary | ICD-10-CM | POA: Diagnosis present

## 2013-05-05 DIAGNOSIS — I5032 Chronic diastolic (congestive) heart failure: Secondary | ICD-10-CM | POA: Diagnosis present

## 2013-05-05 DIAGNOSIS — I872 Venous insufficiency (chronic) (peripheral): Secondary | ICD-10-CM | POA: Diagnosis present

## 2013-05-05 DIAGNOSIS — I509 Heart failure, unspecified: Secondary | ICD-10-CM | POA: Diagnosis present

## 2013-05-05 DIAGNOSIS — L0291 Cutaneous abscess, unspecified: Secondary | ICD-10-CM

## 2013-05-05 DIAGNOSIS — L0390 Cellulitis, unspecified: Secondary | ICD-10-CM

## 2013-05-05 DIAGNOSIS — D509 Iron deficiency anemia, unspecified: Secondary | ICD-10-CM | POA: Diagnosis present

## 2013-05-05 DIAGNOSIS — L03119 Cellulitis of unspecified part of limb: Principal | ICD-10-CM

## 2013-05-05 DIAGNOSIS — L97909 Non-pressure chronic ulcer of unspecified part of unspecified lower leg with unspecified severity: Secondary | ICD-10-CM | POA: Diagnosis present

## 2013-05-05 DIAGNOSIS — G4730 Sleep apnea, unspecified: Secondary | ICD-10-CM | POA: Diagnosis present

## 2013-05-05 DIAGNOSIS — L02419 Cutaneous abscess of limb, unspecified: Principal | ICD-10-CM | POA: Diagnosis present

## 2013-05-05 DIAGNOSIS — R5381 Other malaise: Secondary | ICD-10-CM | POA: Diagnosis present

## 2013-05-05 DIAGNOSIS — Z9981 Dependence on supplemental oxygen: Secondary | ICD-10-CM

## 2013-05-05 DIAGNOSIS — Z950 Presence of cardiac pacemaker: Secondary | ICD-10-CM

## 2013-05-05 DIAGNOSIS — I442 Atrioventricular block, complete: Secondary | ICD-10-CM

## 2013-05-05 DIAGNOSIS — Z7982 Long term (current) use of aspirin: Secondary | ICD-10-CM

## 2013-05-05 DIAGNOSIS — M069 Rheumatoid arthritis, unspecified: Secondary | ICD-10-CM | POA: Diagnosis present

## 2013-05-05 DIAGNOSIS — I5189 Other ill-defined heart diseases: Secondary | ICD-10-CM

## 2013-05-05 DIAGNOSIS — I4891 Unspecified atrial fibrillation: Secondary | ICD-10-CM | POA: Diagnosis present

## 2013-05-05 DIAGNOSIS — I4821 Permanent atrial fibrillation: Secondary | ICD-10-CM

## 2013-05-05 DIAGNOSIS — I10 Essential (primary) hypertension: Secondary | ICD-10-CM | POA: Diagnosis present

## 2013-05-05 LAB — CBC WITH DIFFERENTIAL/PLATELET
Basophils Absolute: 0 10*3/uL (ref 0.0–0.1)
Basophils Relative: 0 % (ref 0–1)
EOS ABS: 0.1 10*3/uL (ref 0.0–0.7)
EOS PCT: 2 % (ref 0–5)
HEMATOCRIT: 34 % — AB (ref 36.0–46.0)
Hemoglobin: 10.8 g/dL — ABNORMAL LOW (ref 12.0–15.0)
LYMPHS ABS: 1.7 10*3/uL (ref 0.7–4.0)
LYMPHS PCT: 31 % (ref 12–46)
MCH: 29.7 pg (ref 26.0–34.0)
MCHC: 31.8 g/dL (ref 30.0–36.0)
MCV: 93.4 fL (ref 78.0–100.0)
MONO ABS: 0.4 10*3/uL (ref 0.1–1.0)
MONOS PCT: 7 % (ref 3–12)
Neutro Abs: 3.2 10*3/uL (ref 1.7–7.7)
Neutrophils Relative %: 60 % (ref 43–77)
PLATELETS: 214 10*3/uL (ref 150–400)
RBC: 3.64 MIL/uL — AB (ref 3.87–5.11)
RDW: 13 % (ref 11.5–15.5)
WBC: 5.4 10*3/uL (ref 4.0–10.5)

## 2013-05-05 LAB — BASIC METABOLIC PANEL
BUN: 21 mg/dL (ref 6–23)
CHLORIDE: 100 meq/L (ref 96–112)
CO2: 29 mEq/L (ref 19–32)
CREATININE: 0.95 mg/dL (ref 0.50–1.10)
Calcium: 9.6 mg/dL (ref 8.4–10.5)
GFR calc non Af Amer: 56 mL/min — ABNORMAL LOW (ref >90)
GFR, EST AFRICAN AMERICAN: 65 mL/min — AB (ref >90)
Glucose, Bld: 90 mg/dL (ref 70–99)
POTASSIUM: 4.4 meq/L (ref 3.7–5.3)
Sodium: 141 mEq/L (ref 137–147)

## 2013-05-05 MED ORDER — SIMVASTATIN 20 MG PO TABS
20.0000 mg | ORAL_TABLET | Freq: Every day | ORAL | Status: DC
  Administered 2013-05-06: 20 mg via ORAL
  Filled 2013-05-05: qty 1

## 2013-05-05 MED ORDER — ENOXAPARIN SODIUM 40 MG/0.4ML ~~LOC~~ SOLN
40.0000 mg | SUBCUTANEOUS | Status: DC
  Administered 2013-05-06: 40 mg via SUBCUTANEOUS
  Filled 2013-05-05: qty 0.4

## 2013-05-05 MED ORDER — VANCOMYCIN HCL 10 G IV SOLR
1250.0000 mg | INTRAVENOUS | Status: DC
  Filled 2013-05-05 (×4): qty 1250

## 2013-05-05 MED ORDER — ONDANSETRON HCL 4 MG PO TABS: 4.0000 mg | ORAL_TABLET | Freq: Four times a day (QID) | ORAL | Status: DC | PRN

## 2013-05-05 MED ORDER — LOSARTAN POTASSIUM 50 MG PO TABS
100.0000 mg | ORAL_TABLET | Freq: Every morning | ORAL | Status: DC
  Administered 2013-05-06: 100 mg via ORAL
  Filled 2013-05-05: qty 2

## 2013-05-05 MED ORDER — ASPIRIN EC 325 MG PO TBEC
325.0000 mg | DELAYED_RELEASE_TABLET | Freq: Every morning | ORAL | Status: DC
  Administered 2013-05-06: 325 mg via ORAL
  Filled 2013-05-05: qty 1

## 2013-05-05 MED ORDER — ACETAMINOPHEN 325 MG PO TABS
650.0000 mg | ORAL_TABLET | Freq: Four times a day (QID) | ORAL | Status: DC | PRN
  Administered 2013-05-06: 650 mg via ORAL
  Filled 2013-05-05: qty 2

## 2013-05-05 MED ORDER — ONDANSETRON HCL 4 MG/2ML IJ SOLN: 4.0000 mg | Freq: Four times a day (QID) | INTRAMUSCULAR | Status: DC | PRN

## 2013-05-05 MED ORDER — CARVEDILOL 12.5 MG PO TABS
25.0000 mg | ORAL_TABLET | Freq: Every day | ORAL | Status: DC
  Administered 2013-05-06: 25 mg via ORAL
  Filled 2013-05-05: qty 2

## 2013-05-05 MED ORDER — FUROSEMIDE 20 MG PO TABS
20.0000 mg | ORAL_TABLET | Freq: Every morning | ORAL | Status: DC
  Administered 2013-05-06: 20 mg via ORAL
  Filled 2013-05-05: qty 1

## 2013-05-05 MED ORDER — SODIUM CHLORIDE 0.9 % IV SOLN
INTRAVENOUS | Status: DC
  Administered 2013-05-05: 1000 mL via INTRAVENOUS

## 2013-05-05 MED ORDER — ACETAMINOPHEN 500 MG PO TABS: 500.0000 mg | ORAL_TABLET | Freq: Four times a day (QID) | ORAL | Status: DC | PRN

## 2013-05-05 MED ORDER — ACETAMINOPHEN 650 MG RE SUPP: 650.0000 mg | Freq: Four times a day (QID) | RECTAL | Status: DC | PRN

## 2013-05-05 MED ORDER — VANCOMYCIN HCL IN DEXTROSE 1-5 GM/200ML-% IV SOLN
1000.0000 mg | Freq: Once | INTRAVENOUS | Status: AC
  Administered 2013-05-05: 1000 mg via INTRAVENOUS
  Filled 2013-05-05: qty 200

## 2013-05-05 MED ORDER — SODIUM CHLORIDE 0.9 % IV SOLN
INTRAVENOUS | Status: DC
  Administered 2013-05-05: 22:00:00 via INTRAVENOUS

## 2013-05-05 NOTE — ED Notes (Addendum)
Pt reports has poor circulation in lower extremities.  Both are dark and discolored.  Reports has sores on both legs.  Reports has been an ongoing problem for several years.  Pt has wounds wrapped with ace wrap.  Pt has refused to go to the wound care center.  PT went to Dr. Tawanna Sat office and had wounds dressed.  Pt was sent here for eval.

## 2013-05-05 NOTE — Progress Notes (Signed)
ANTIBIOTIC CONSULT NOTE - INITIAL  Pharmacy Consult for Vancomycin Indication: cellulitis  No Known Allergies  Patient Measurements: Height: 4\' 11"  (149.9 cm) Weight: 148 lb 8 oz (67.359 kg) IBW/kg (Calculated) : 43.2  Vital Signs: Temp: 97.9 F (36.6 C) (04/09 2128) Temp src: Oral (04/09 2128) BP: 146/60 mmHg (04/09 2128) Pulse Rate: 60 (04/09 2128) Intake/Output from previous day:   Intake/Output from this shift:    Labs:  Recent Labs  05/05/13 1807  WBC 5.4  HGB 10.8*  PLT 214  CREATININE 0.95   Estimated Creatinine Clearance: 41.4 ml/min (by C-G formula based on Cr of 0.95). No results found for this basename: VANCOTROUGH, VANCOPEAK, VANCORANDOM, [place], GENTPEAK, GENTRANDOM, TOBRATROUGH, TOBRAPEAK, TOBRARND, AMIKACINPEAK, AMIKACINTROU, AMIKACIN,  in the last 72 hours   Microbiology: Recent Results (from the past 720 hour(s))  CULTURE, BLOOD (ROUTINE X 2)     Status: None   Collection Time    05/05/13  8:02 PM      Result Value Ref Range Status   Specimen Description BLOOD RIGHT FOREARM   Final   Special Requests     Final   Value: BOTTLES DRAWN AEROBIC AND ANAEROBIC AEB 8CC ANA 6CC   Culture PENDING   Incomplete   Report Status PENDING   Incomplete  CULTURE, BLOOD (ROUTINE X 2)     Status: None   Collection Time    05/05/13  8:04 PM      Result Value Ref Range Status   Specimen Description BLOOD LEFT HAND   Final   Special Requests BOTTLES DRAWN AEROBIC ONLY [place]   Final   Culture PENDING   Incomplete   Report Status PENDING   Incomplete    Medical History: Past Medical History  Diagnosis Date  . Permanent atrial fibrillation   . Complete heart block     s/p PPM by Dr Rayann Heman  . Diastolic dysfunction   . Venous insufficiency     with chronic leg ulcers  . Obesity   . Pulmonary hypertension   . Chronic lung disease     on home O2  . Debilitated   . Hypertension   . Sleep apnea   . Rheumatoid arthritis(714.0)     Medications:  Pt  received Vancomycin 1000mg  IV on admission  Assessment: 80yo female, wt = 67Kg, SCr at baseline.  Normalized ClCr 50-44ml/min.  Asked to initiate Vancomycin for cellulitis  Goal of Therapy:  Vancomycin trough level 10-15 mcg/ml  Plan:  Vancomycin 1250mg  IV q24hrs starting tomorrow Check trough at steady state Monitor labs, renal fxn, and cultures  [name] [name] 05/05/2013,9:51 PM

## 2013-05-05 NOTE — Telephone Encounter (Signed)
Please followup on this prescription I do not understand, and I do not remember calling this prescription in. If the patient is having no symptoms she does not need to take the antibiotic.

## 2013-05-05 NOTE — Telephone Encounter (Signed)
Received request from [facility] for a refill on amoxicillin 500mg  I tab BID. Do not see on med list. Please advise

## 2013-05-05 NOTE — H&P (Signed)
PCP:   Redge Gainer, MD   Chief Complaint:  Leg wound  HPI:  80 y/o female who  has a past medical history of Permanent atrial fibrillation; Complete heart block; Diastolic dysfunction; Venous insufficiency; Obesity; Pulmonary hypertension; Chronic lung disease; Debilitated; Hypertension; Sleep apnea; and Rheumatoid arthritis(714.0). Today presented to the hospital with worsening leg wounds. She was seen by the PCP who diagnosed her with cellulitis, and sent to the hospital for admission. As per the daughter she has h/o venous stasis ulcer, and has been followed by the wound care center. The wounds got completely healed and reoccured this year in January, since than they the family has been taking care of the wounds at home.She denies fever, no chills, denies leg pain. No nausea, vomiting or diarrhea. Patient is able to walk without difficulty.   Allergies:  No Known Allergies    Past Medical History  Diagnosis Date  . Permanent atrial fibrillation   . Complete heart block     s/p PPM by Dr Rayann Heman  . Diastolic dysfunction   . Venous insufficiency     with chronic leg ulcers  . Obesity   . Pulmonary hypertension   . Chronic lung disease     on home O2  . Debilitated   . Hypertension   . Sleep apnea   . Rheumatoid arthritis(714.0)     Past Surgical History  Procedure Laterality Date  . Pacemaker insertion  08/13/10    SJM by Dr Rayann Heman    Prior to Admission medications   Medication Sig Start Date End Date Taking? Authorizing Provider  acetaminophen (TYLENOL EX ST ARTHRITIS PAIN) 500 MG tablet Take 500 mg by mouth every 6 (six) hours as needed for mild pain or moderate pain.   Yes Historical Provider, MD  aspirin EC 325 MG tablet Take 325 mg by mouth every morning.   Yes Historical Provider, MD  carvedilol (COREG) 25 MG tablet Take 25 mg by mouth every morning.    Yes Historical Provider, MD  ferrous sulfate 325 (65 FE) MG tablet Take 325 mg by mouth daily with breakfast.    Yes Historical Provider, MD  furosemide (LASIX) 20 MG tablet Take 20 mg by mouth every morning.    Yes Historical Provider, MD  losartan (COZAAR) 100 MG tablet Take 100 mg by mouth every morning.    Yes Historical Provider, MD  OXYGEN-HELIUM IN Inhale into the lungs continuous.   Yes Historical Provider, MD  simvastatin (ZOCOR) 20 MG tablet Take 20 mg by mouth daily.  06/30/11  Yes Historical Provider, MD    Social History:  reports that she has never smoked. She has never used smokeless tobacco. She reports that she does not drink alcohol or use illicit drugs.  No family history on file.   All the positives are listed in BOLD  Review of Systems:  HEENT: Headache, blurred vision, runny nose, sore throat Neck: Hypothyroidism, hyperthyroidism,,lymphadenopathy Chest : Shortness of breath, history of COPD, Asthma Heart : Chest pain, history of coronary arterey disease GI:  Nausea, vomiting, diarrhea, constipation, GERD GU: Dysuria, urgency, frequency of urination, hematuria Neuro: Stroke, seizures, syncope Psych: Depression, anxiety, hallucinations   Physical Exam: Blood pressure 129/63, pulse 64, temperature 97.5 F (36.4 C), temperature source Oral, resp. rate 18, height 4' 11" (1.499 m), weight 67.586 kg (149 lb), SpO2 96.00%. Constitutional:   Patient is a well-developed and well-nourished *female in no acute distress and cooperative with exam. Head: Normocephalic and atraumatic Mouth: Mucus membranes  moist Eyes: PERRL, EOMI, conjunctivae normal Neck: Supple, No Thyromegaly Cardiovascular: RRR, S1 normal, S2 normal Pulmonary/Chest: CTAB, no wheezes, rales, or rhonchi Abdominal: Soft. Non-tender, non-distended, bowel sounds are normal, no masses, organomegaly, or guarding present.  Neurological: A&O x3, Strenght is normal and symmetric bilaterally, cranial nerve II-XII are grossly intact, no focal motor deficit, sensory intact to light touch bilaterally.  Extremities : Large venous  stasis ulcer noted on the lateral aspect of the right leg, extending down to the foot, two small ulcers noted on the lateral aspect of the left leg   Labs on Admission:  Results for orders placed during the hospital encounter of 05/05/13 (from the past 48 hour(s))  CBC WITH DIFFERENTIAL     Status: Abnormal   Collection Time    05/05/13  6:07 PM      Result Value Ref Range   WBC 5.4  4.0 - 10.5 K/uL   RBC 3.64 (*) 3.87 - 5.11 MIL/uL   Hemoglobin 10.8 (*) 12.0 - 15.0 g/dL   HCT 34.0 (*) 36.0 - 46.0 %   MCV 93.4  78.0 - 100.0 fL   MCH 29.7  26.0 - 34.0 pg   MCHC 31.8  30.0 - 36.0 g/dL   RDW 13.0  11.5 - 15.5 %   Platelets 214  150 - 400 K/uL   Neutrophils Relative % 60  43 - 77 %   Neutro Abs 3.2  1.7 - 7.7 K/uL   Lymphocytes Relative 31  12 - 46 %   Lymphs Abs 1.7  0.7 - 4.0 K/uL   Monocytes Relative 7  3 - 12 %   Monocytes Absolute 0.4  0.1 - 1.0 K/uL   Eosinophils Relative 2  0 - 5 %   Eosinophils Absolute 0.1  0.0 - 0.7 K/uL   Basophils Relative 0  0 - 1 %   Basophils Absolute 0.0  0.0 - 0.1 K/uL  BASIC METABOLIC PANEL     Status: Abnormal   Collection Time    05/05/13  6:07 PM      Result Value Ref Range   Sodium 141  137 - 147 mEq/L   Potassium 4.4  3.7 - 5.3 mEq/L   Chloride 100  96 - 112 mEq/L   CO2 29  19 - 32 mEq/L   Glucose, Bld 90  70 - 99 mg/dL   BUN 21  6 - 23 mg/dL   Creatinine, Ser 0.95  0.50 - 1.10 mg/dL   Calcium 9.6  8.4 - 10.5 mg/dL   GFR calc non Af Amer 56 (*) >90 mL/min   GFR calc Af Amer 65 (*) >90 mL/min   Comment: (NOTE)     The eGFR has been calculated using the CKD EPI equation.     This calculation has not been validated in all clinical situations.     eGFR's persistently <90 mL/min signify possible Chronic Kidney     Disease.    Radiological Exams on Admission: No results found.  Assessment/Plan Principal Problem:   Cellulitis Active Problems:   Permanent atrial fibrillation   Venous insufficiency  Cellulitis We'll start the  patient on vancomycin per pharmacy consultation. We'll obtain wound care consultation a.m. Blood cultures x2 will be obtained  Atrial fibrillation Patient heart rate is well controlled, we'll continue with Coreg and aspirin  History of complete heart block Status post permanent pacemaker, stable  Hypertension Continue Coreg  Code status: Patient is full code  Family discussion: Discussed with patient's husband  and daughter at bedside   Time Spent on Admission: 60 minutes   S  [facility] Pager: [phone] 05/05/2013, 7:44 PM  If 7PM-7AM, please contact night-coverage  [email or web address]  Password [email or web address]   

## 2013-05-05 NOTE — Telephone Encounter (Signed)
Spoke with pt and she says she wants amoxicillin called in. Pt states "I have an infected leg" pt was advised needs appt and is to come in today

## 2013-05-05 NOTE — Telephone Encounter (Signed)
See above note

## 2013-05-05 NOTE — ED Provider Notes (Signed)
CSN: [id number]     Arrival date & time 05/05/13  1734 History   First MD Initiated Contact with Patient 05/05/13 1746     Chief Complaint  Patient presents with  . Wound Infection     (Consider location/radiation/quality/duration/timing/severity/associated sxs/prior Treatment) The history is provided by the patient.   Patient here complaining of worsening right lower extremity wound. Seen by her physician today and diagnosed a cellulitis and sent to the hospital for admission. She denies any fever or chills. Wound has been expanding over the past week. She has been using wound care home without improvement. She does have a history of chronic venous stasis ulcers. No vomiting or diarrhea. Symptoms are persistent. Nothing makes them better or worse. Past Medical History  Diagnosis Date  . Permanent atrial fibrillation   . Complete heart block     s/p PPM by Dr Rayann Heman  . Diastolic dysfunction   . Venous insufficiency     with chronic leg ulcers  . Obesity   . Pulmonary hypertension   . Chronic lung disease     on home O2  . Debilitated   . Hypertension   . Sleep apnea   . Rheumatoid arthritis(714.0)    Past Surgical History  Procedure Laterality Date  . Pacemaker insertion  08/13/10    SJM by Dr Rayann Heman   No family history on file. History  Substance Use Topics  . Smoking status: Never Smoker   . Smokeless tobacco: Never Used  . Alcohol Use: No   OB History   Grav Para Term Preterm Abortions TAB SAB Ect Mult Living                 Review of Systems  All other systems reviewed and are negative.     Allergies  Review of patient's allergies indicates no known allergies.  Home Medications   Current Outpatient Rx  Name  Route  Sig  Dispense  Refill  . aspirin 325 MG tablet   Oral   Take 325 mg by mouth daily.           . carvedilol (COREG) 25 MG tablet   Oral   Take 25 mg by mouth daily.          . ferrous sulfate 325 (65 FE) MG tablet   Oral   Take 325  mg by mouth daily with breakfast.         . furosemide (LASIX) 20 MG tablet   Oral   Take 20 mg by mouth daily.          Marland Kitchen losartan (COZAAR) 100 MG tablet   Oral   Take 100 mg by mouth daily.           Marland Kitchen oxyCODONE-acetaminophen (PERCOCET) 7.5-325 MG per tablet   Oral   Take 1 tablet by mouth every 6 (six) hours as needed.          . simvastatin (ZOCOR) 20 MG tablet   Oral   Take 20 mg by mouth daily.          . Zinc 50 MG CAPS   Oral   Take 50 mg by mouth daily.          BP 129/63  Pulse 64  Temp(Src) 97.5 F (36.4 C) (Oral)  Resp 18  Ht 4\' 11"  (1.499 m)  Wt 149 lb (67.586 kg)  BMI 30.08 kg/m2  SpO2 96% Physical Exam  Nursing note and vitals reviewed. Constitutional: She is oriented  to person, place, and time. She appears well-developed and well-nourished.  Non-toxic appearance. No distress.  HENT:  Head: Normocephalic and atraumatic.  Eyes: Conjunctivae, EOM and lids are normal. Pupils are equal, round, and reactive to light.  Neck: Normal range of motion. Neck supple. No tracheal deviation present. No mass present.  Cardiovascular: Normal rate, regular rhythm and normal heart sounds.  Exam reveals no gallop.   No murmur heard. Pulmonary/Chest: Effort normal and breath sounds normal. No stridor. No respiratory distress. She has no decreased breath sounds. She has no wheezes. She has no rhonchi. She has no rales.  Abdominal: Soft. Normal appearance and bowel sounds are normal. She exhibits no distension. There is no tenderness. There is no rebound and no CVA tenderness.  Musculoskeletal: Normal range of motion. She exhibits no edema and no tenderness.       Legs: Neurological: She is alert and oriented to person, place, and time. She has normal strength. No cranial nerve deficit or sensory deficit. GCS eye subscore is 4. GCS verbal subscore is 5. GCS motor subscore is 6.  Skin: Skin is warm and dry. No abrasion and no rash noted.  Psychiatric: She has a  normal mood and affect. Her speech is normal and behavior is normal.    ED Course  Procedures (including critical care time) Labs Review Labs Reviewed  CBC WITH DIFFERENTIAL  BASIC METABOLIC PANEL   Imaging Review No results found.   EKG Interpretation None      MDM   Final diagnoses:  None    Patient given vancomycin IV and will be admitted by medicine    Leota Jacobsen, MD 05/05/13 [place]

## 2013-05-05 NOTE — Progress Notes (Signed)
   Subjective:    Patient ID: [name], female    DOB: 08-14-1933, 80 y.o.   MRN: [id number]  HPI This 80 y.o. female presents for evaluation of cellulitis of the lower extremities.  She has been Putting dressings on her LE's for the last few weeks.  She is here for evaluation.  She has been Seen in the wound clinic in the past.   Review of Systems C/o lower extremity cellulitis and ulcers. No chest pain, SOB, HA, dizziness, vision change, N/V, diarrhea, constipation, dysuria, urinary urgency or frequency, myalgias, arthralgias or rash.     Objective:   Physical Exam  Vital signs noted  Chronically ill appearing female.  HEENT - Head atraumatic Normocephalic Respiratory - Lungs CTA bilateral Cardiac - RRR S1 and S2 without murmur GI - Abdomen soft Nontender and bowel sounds active x 4 Extremities - Bilateral legs with venous stasis and erythema and open draining wounds bilateral Lower extremities to mid tibial region from mid tibial to ankles.  Clear drainage from wounds bilateral Lower extremities. Neuro - Grossly intact.      Assessment & Plan:  Cellulitis Discussed with patient that she needs immediate aggressive care on her lower extremities and would be better off going to ED and have them r/o sepsis and may need admission and wound therapy.  Lysbeth Penner FNP

## 2013-05-06 DIAGNOSIS — I872 Venous insufficiency (chronic) (peripheral): Secondary | ICD-10-CM

## 2013-05-06 DIAGNOSIS — I519 Heart disease, unspecified: Secondary | ICD-10-CM

## 2013-05-06 LAB — CBC
HCT: 33.5 % — ABNORMAL LOW (ref 36.0–46.0)
Hemoglobin: 10.7 g/dL — ABNORMAL LOW (ref 12.0–15.0)
MCH: 29.7 pg (ref 26.0–34.0)
MCHC: 31.9 g/dL (ref 30.0–36.0)
MCV: 93.1 fL (ref 78.0–100.0)
PLATELETS: 204 10*3/uL (ref 150–400)
RBC: 3.6 MIL/uL — ABNORMAL LOW (ref 3.87–5.11)
RDW: 13 % (ref 11.5–15.5)
WBC: 6.1 10*3/uL (ref 4.0–10.5)

## 2013-05-06 LAB — COMPREHENSIVE METABOLIC PANEL
ALT: 7 U/L (ref 0–35)
AST: 16 U/L (ref 0–37)
Albumin: 2.9 g/dL — ABNORMAL LOW (ref 3.5–5.2)
Alkaline Phosphatase: 79 U/L (ref 39–117)
BUN: 19 mg/dL (ref 6–23)
CO2: 28 mEq/L (ref 19–32)
Calcium: 8.9 mg/dL (ref 8.4–10.5)
Chloride: 105 mEq/L (ref 96–112)
Creatinine, Ser: 0.9 mg/dL (ref 0.50–1.10)
GFR calc non Af Amer: 60 mL/min — ABNORMAL LOW (ref >90)
GFR, EST AFRICAN AMERICAN: 70 mL/min — AB (ref >90)
Glucose, Bld: 92 mg/dL (ref 70–99)
Potassium: 4.1 mEq/L (ref 3.7–5.3)
SODIUM: 143 meq/L (ref 137–147)
TOTAL PROTEIN: 6.5 g/dL (ref 6.0–8.3)
Total Bilirubin: 0.3 mg/dL (ref 0.3–1.2)

## 2013-05-06 MED ORDER — ENSURE COMPLETE PO LIQD: 237.0000 mL | Freq: Two times a day (BID) | ORAL | Status: DC

## 2013-05-06 MED ORDER — CLINDAMYCIN HCL 300 MG PO CAPS: 300.0000 mg | ORAL_CAPSULE | Freq: Three times a day (TID) | ORAL | Status: DC

## 2013-05-06 NOTE — Progress Notes (Signed)
Patient discharged home with family.  Reviewed DC instructions with patient and daughter at bed side.  Instructed on new antibiotic and how to take at home, emphasized importance of completing whole dose.  Instructed to call MD for increased s/s of infection and any abnormal feelings of dizziness, SOB, etc.  Verbalizes understanding.  HH to follow and place una boots tomorrow at home, since could not be done at hospital today.  MD aware and ok with plan.  CM contacted [facility] for this reason.  No questions at this time, stable to DC home. Left floor via WC with NT

## 2013-05-06 NOTE — Progress Notes (Signed)
INITIAL NUTRITION ASSESSMENT  DOCUMENTATION CODES Per approved criteria  -Obesity Unspecified   INTERVENTION: Ensure Complete po BID, each supplement provides 350 kcal and 13 grams of protein. Downgrade diet to dysphagia 2 with chopped meats for ease of intake.   NUTRITION DIAGNOSIS: Inadequate oral intake related to masticatory difficulty as evidenced by PO: 25-50%.   Goal: Pt will meet >90% of estimated nutritional needs  Monitor:  PO intake, labs, skin assessments, weight changes, I/O's  Reason for Assessment: MST=3  80 y.o. female  Admitting Dx: Cellulitis  ASSESSMENT: Pt admitted with cellulitis. She reports good appetite PTA, however, reports poor appetite today. Pt eating lunch at time of visit and completed approximately 25-50% of her tray. She reports that she has chewing difficulty due to ill fitting dentures and typically grinds her meat at home for ease of intake. She is agreeable to diet downgrade.  She reports that she has been drinking either Ensure (when it is available) or one scoop of protein powder in a glass of milk for additional nutrition. She requests chocolate Ensure during hospitalization as well. Wt hx reveals a hx of weight gain, however, wt changes are not significant for time frame given.   Height: Ht Readings from Last 1 Encounters:  05/05/13 4\' 11"  (1.499 m)    Weight: Wt Readings from Last 1 Encounters:  05/06/13 149 lb (67.586 kg)    Ideal Body Weight: 98#  % Ideal Body Weight: 152%  Wt Readings from Last 10 Encounters:  05/06/13 149 lb (67.586 kg)  05/05/13 149 lb (67.586 kg)  08/11/12 140 lb (63.504 kg)  08/14/11 143 lb (64.864 kg)  12/12/10 175 lb (79.379 kg)    Usual Body Weight: 143#  % Usual Body Weight: 104%  BMI:  Body mass index is 30.08 kg/(m^2). Meets criteria for obesity, class I.   Estimated Nutritional Needs: Kcal: 1200-1400 daily Protein: 54-68 grams daily Fluid: 1.2-1.4 L daily  Skin: venous stasis ulcers  in lt lateral leg, rt pretibial leg, and lt lateral leg  Diet Order: Cardiac  EDUCATION NEEDS: -Education needs addressed  No intake or output data in the 24 hours ending 05/06/13 1346  Last BM: 05/04/13  Labs:   Recent Labs Lab 05/05/13 1807 05/06/13 0532  NA 141 143  K 4.4 4.1  CL 100 105  CO2 29 28  BUN 21 19  CREATININE 0.95 0.90  CALCIUM 9.6 8.9  GLUCOSE 90 92    CBG (last 3)  No results found for this basename: GLUCAP,  in the last 72 hours  Scheduled Meds: . aspirin EC  325 mg Oral q morning - 10a  . carvedilol  25 mg Oral Q breakfast  . enoxaparin (LOVENOX) injection  40 mg Subcutaneous Q24H  . furosemide  20 mg Oral q morning - 10a  . losartan  100 mg Oral q morning - 10a  . simvastatin  20 mg Oral q1800  . vancomycin  1,250 mg Intravenous Q24H    Continuous Infusions: . sodium chloride 75 mL/hr at 05/05/13 2150    Past Medical History  Diagnosis Date  . Permanent atrial fibrillation   . Complete heart block     s/p PPM by Dr Rayann Heman  . Diastolic dysfunction   . Venous insufficiency     with chronic leg ulcers  . Obesity   . Pulmonary hypertension   . Chronic lung disease     on home O2  . Debilitated   . Hypertension   . Sleep apnea   .  Rheumatoid arthritis(714.0)     Past Surgical History  Procedure Laterality Date  . Pacemaker insertion  08/13/10    SJM by Dr Lendon Collar A. Jimmye Norman, RD, LDN Pager: [phone]

## 2013-05-06 NOTE — Care Management Note (Signed)
    Page 1 of 1   05/06/2013     12:57:19 PM   CARE MANAGEMENT NOTE 05/06/2013  Patient:  PUJA, CAFFEY   Account Number:  [id number]  Date Initiated:  05/06/2013  Documentation initiated by:  Claretha Cooper  Subjective/Objective Assessment:   Pt admitted from home where he lives with her spouse. Pt has O2 from [place] at home. She walks with a walker. Would like HH RN. Selected [facility] since they had used them in the past.     Action/Plan:   CM reminded husband and daughter to bring an O2 canister to hospital for DC   Anticipated DC Date:     Anticipated DC Plan:  [place]  CM consult      [place]   Choice offered to / List presented to:  C-1 Patient        [place] arranged  HH-10 DISEASE MANAGEMENT  HH-1 RN      [place].   Status of service:  Completed, signed off Medicare Important Message given?   (If response is "NO", the following Medicare IM given date fields will be blank) Date Medicare IM given:   Date Additional Medicare IM given:    Discharge Disposition:    Per UR Regulation:    If discussed at Long Length of Stay Meetings, dates discussed:    Comments:  05/06/13 Claretha Cooper RN BSN CM

## 2013-05-06 NOTE — Discharge Summary (Signed)
Physician Discharge Summary  [name] YNW:[id number] DOB: 12-13-1933 DOA: 05/05/2013  PCP: Redge Gainer, MD  Admit date: 05/05/2013 Discharge date: 05/06/2013  Recommendations for Outpatient Follow-up:  1. Home health nurse to change dressings in approximately one week. 2. Given a course of clindamycin to assist with wound healing. There does appear to be a small amount of erythema posterior to the right lower extremity ulcer.  Discharge Diagnoses:  Principal Problem:   Cellulitis Active Problems:   Permanent atrial fibrillation   Venous insufficiency   Discharge Condition: Stable, improved  Diet recommendation: Healthy heart  Wt Readings from Last 3 Encounters:  05/06/13 67.586 kg (149 lb)  05/05/13 67.586 kg (149 lb)  08/11/12 63.504 kg (140 lb)    History of present illness:  80 y/o female who has a past medical history of Permanent atrial fibrillation; Complete heart block; Diastolic dysfunction; Venous insufficiency; Obesity; Pulmonary hypertension; Chronic lung disease; Debilitated; Hypertension; Sleep apnea; and Rheumatoid arthritis(714.0). Today presented to the hospital with worsening leg wounds. She was seen by the PCP who diagnosed her with cellulitis, and sent to the hospital for admission. As per the daughter she has h/o venous stasis ulcer, and has been followed by the wound care center. The wounds got completely healed and reoccured this year in January, since than they the family has been taking care of the wounds at home.She denies fever, no chills, denies leg pain. No nausea, vomiting or diarrhea. Patient is able to walk without difficulty.  Hospital Course:   Bilateral lower extremity edema with stasis dermatitis, hemosiderin deposition, possible small amount of cellulitis of the right lower extremity. The patient was started on vancomycin and transition to clindamycin. She was seen by wound care and consultation. She had calcium alginate applied to her right  lower extremity wounds and a silicone form dressing to the left lower extremity ulceration. Profore was then applied. Home health nurse will assist with dressing changes in approximately one week.  Atrial fibrillation, rate controlled. Continued aspirin Coreg.  History of complete heart block status post pacemaker, stable.  Hypertension, blood pressure well controlled, continue Coreg.  Iron deficiency anemia, continued iron.  Chronic diastolic heart failure with stable lower extremity edema.  Continue current lasix dose.  CLD with pulmonary hypertension, O2 dependent.  Continue home oxygen.    Procedures:  None  Consultations:  Wound care  Discharge Exam: Filed Vitals:   05/06/13 0415  BP: 114/55  Pulse: 67  Temp: 98.1 F (36.7 C)  Resp: 18   Filed Vitals:   05/05/13 1737 05/05/13 2128 05/06/13 0415  BP: 129/63 146/60 114/55  Pulse: 64 60 67  Temp: 97.5 F (36.4 C) 97.9 F (36.6 C) 98.1 F (36.7 C)  TempSrc: Oral Oral Oral  Resp: 18 18 18   Height: 4\' 11"  (1.499 m) 4\' 11"  (1.499 m)   Weight: 67.586 kg (149 lb) 67.359 kg (148 lb 8 oz) 67.586 kg (149 lb)  SpO2: 96% 94% 95%    General: Caucasian female, no acute distress HEENT: Normocephalic atraumatic, moist because membranes Cardiovascular: Regular rate and rhythm, 3/6 systolic murmur at the left sternal border, no rubs or gallops Respiratory: Clear.the patient bilaterally Abdomen: Normal active bowel sounds, soft, nondistended, nontender Extremities: Bilateral lower extremity edema and 1+ pitting. Hemosiderin deposition bilateral lower extremities. Small ulceration left lower extremity on the lateral aspect, without significant surrounding erythema or discharge. Right lower extremity with a larger area of ulcer base, minimal amount of erythema with some mild induration in the  posterior aspect of the wound. No foul odor or significant discharge.  Discharge Instructions      Discharge Orders   Future  Appointments Provider Department Dept Phone   05/18/2013 8:15 AM [facility] [address] [facility] [phone]   Future Orders Complete By Expires   Call MD for:  difficulty breathing, headache or visual disturbances  As directed    Call MD for:  extreme fatigue  As directed    Call MD for:  hives  As directed    Call MD for:  persistant dizziness or light-headedness  As directed    Call MD for:  persistant nausea and vomiting  As directed    Call MD for:  redness, tenderness, or signs of infection (pain, swelling, redness, odor or green/yellow discharge around incision site)  As directed    Call MD for:  severe uncontrolled pain  As directed    Call MD for:  temperature >100.4  As directed    Diet - low sodium heart healthy  As directed    Discharge instructions  As directed    Increase activity slowly  As directed        Medication List         aspirin EC 325 MG tablet  Take 325 mg by mouth every morning.     carvedilol 25 MG tablet  Commonly known as:  COREG  Take 25 mg by mouth every morning.     clindamycin 300 MG capsule  Commonly known as:  CLEOCIN  Take 1 capsule (300 mg total) by mouth 3 (three) times daily.     ferrous sulfate 325 (65 FE) MG tablet  Take 325 mg by mouth daily with breakfast.     furosemide 20 MG tablet  Commonly known as:  LASIX  Take 20 mg by mouth every morning.     losartan 100 MG tablet  Commonly known as:  COZAAR  Take 100 mg by mouth every morning.     OXYGEN-HELIUM IN  Inhale into the lungs continuous.     simvastatin 20 MG tablet  Commonly known as:  ZOCOR  Take 20 mg by mouth daily.     TYLENOL EX ST ARTHRITIS PAIN 500 MG tablet  Generic drug:  acetaminophen  Take 500 mg by mouth every 6 (six) hours as needed for mild pain or moderate pain.       Follow-up Information   Follow up with [facility]. [facility] RN)    Contact information:   [address] [place] [address] [phone]        The results of significant diagnostics from this hospitalization (including imaging, microbiology, ancillary and laboratory) are listed below for reference.    Significant Diagnostic Studies: No results found.  Microbiology: Recent Results (from the past 240 hour(s))  CULTURE, BLOOD (ROUTINE X 2)     Status: None   Collection Time    05/05/13  8:02 PM      Result Value Ref Range Status   Specimen Description BLOOD RIGHT FOREARM   Final   Special Requests     Final   Value: BOTTLES DRAWN AEROBIC AND ANAEROBIC AEB 8CC ANA 6CC   Culture PENDING   Incomplete   Report Status PENDING   Incomplete  CULTURE, BLOOD (ROUTINE X 2)     Status: None   Collection Time    05/05/13  8:04 PM      Result Value Ref Range Status   Specimen  Description BLOOD LEFT HAND   Final   Special Requests BOTTLES DRAWN AEROBIC ONLY 2CC   Final   Culture PENDING   Incomplete   Report Status PENDING   Incomplete     Labs: Basic Metabolic Panel:  Recent Labs Lab 05/05/13 1807 05/06/13 0532  NA 141 143  K 4.4 4.1  CL 100 105  CO2 29 28  GLUCOSE 90 92  BUN 21 19  CREATININE 0.95 0.90  CALCIUM 9.6 8.9   Liver Function Tests:  Recent Labs Lab 05/06/13 0532  AST 16  ALT 7  ALKPHOS 79  BILITOT 0.3  PROT 6.5  ALBUMIN 2.9*   No results found for this basename: LIPASE, AMYLASE,  in the last 168 hours No results found for this basename: AMMONIA,  in the last 168 hours CBC:  Recent Labs Lab 05/05/13 1807 05/06/13 0532  WBC 5.4 6.1  NEUTROABS 3.2  --   HGB 10.8* 10.7*  HCT 34.0* 33.5*  MCV 93.4 93.1  PLT 214 204   Cardiac Enzymes: No results found for this basename: CKTOTAL, CKMB, CKMBINDEX, TROPONINI,  in the last 168 hours BNP: BNP (last 3 results) No results found for this basename: PROBNP,  in the last 8760 hours CBG: No results found for this basename: GLUCAP,  in the last 168 hours  Time coordinating discharge: 45 minutes  Signed:  [facility] 05/06/2013, 1:00 PM

## 2013-05-06 NOTE — Consult Note (Signed)
WOC wound consult note Reason for Consult: bilateral LE ulcerations.  Pt with long standing history of venous stasis dx.  Has had ulcers in the past and they have healed with compression therapy.  She has had both Unna's boots and Profore in the past her her report. She has not had compression on her legs for greater than 6 mons, per her recollection.  She does not have significant edema and she has bilateral palpable pulses. She has hemosiderin staining bilaterally and some mild venous dermatitis.  Wound type: Venous stasis ulcerations bilateral LE.  Measurement: Left lateral: 4cm x 2cm x 0.2cm  Right pretibial: 14cm x 4cm x 0.2cm  Right lateral: 7cm x 3cm x 0.2cm  Wound bed: Left lateral: partial thickness, appears to be ruptured bulla and is very clean and superficial Right let ulcers are more involved and full thickness, they are clean however and pink, non necrotic.  Drainage (amount, consistency, odor) moderate serosanginous on the right with some oozing with the dressing change.  Minimal on the left, serous, non purulent. Periwound: intact, minimal venous changes Dressing procedure/placement/frequency: Added calcium alginate over the right LE wounds for exudate control and silicone foam to the LLE to protect and then apply Profore (4 layer compression wraps).  Contacted PT department to place Profore today.   Pt will need either follow up in one week for change of her dressing/Profore or she will need HHRN for same.  Discussed POC with patient and bedside nurse.  Re consult if needed, will not follow at this time. Thanks  Amario Longmore [place], [place] [phone])

## 2013-05-10 LAB — CULTURE, BLOOD (ROUTINE X 2)
CULTURE: NO GROWTH
CULTURE: NO GROWTH

## 2013-05-18 ENCOUNTER — Ambulatory Visit (INDEPENDENT_AMBULATORY_CARE_PROVIDER_SITE_OTHER): Payer: Commercial Managed Care - HMO | Admitting: *Deleted

## 2013-05-18 ENCOUNTER — Encounter: Payer: Self-pay | Admitting: Internal Medicine

## 2013-05-18 DIAGNOSIS — I442 Atrioventricular block, complete: Secondary | ICD-10-CM | POA: Diagnosis not present

## 2013-05-18 LAB — MDC_IDC_ENUM_SESS_TYPE_REMOTE
Battery Remaining Longevity: 134 mo
Battery Voltage: 2.99 V
Implantable Pulse Generator Model: 1210
Implantable Pulse Generator Serial Number: 7245638
Lead Channel Sensing Intrinsic Amplitude: 8.3 mV
Lead Channel Setting Pacing Amplitude: 2.5 V
Lead Channel Setting Pacing Pulse Width: 0.4 ms
Lead Channel Setting Sensing Sensitivity: 3 mV
MDC IDC MSMT LEADCHNL RV IMPEDANCE VALUE: 690 Ohm
MDC IDC MSMT LEADCHNL RV PACING THRESHOLD AMPLITUDE: 0.75 V
MDC IDC MSMT LEADCHNL RV PACING THRESHOLD PULSEWIDTH: 0.4 ms
MDC IDC SESS DTM: 20150422071727
MDC IDC STAT BRADY RV PERCENT PACED: 98 %

## 2013-05-24 ENCOUNTER — Encounter: Payer: Self-pay | Admitting: *Deleted

## 2013-05-31 NOTE — Progress Notes (Signed)
Pacemaker remote check. Device function reviewed. Impedance, sensing, auto capture thresholds consistent with previous measurements. Histograms appropriate for patient and level of activity. All other diagnostic data reviewed and is appropriate and stable for patient. Real time/magnet EGM shows appropriate sensing and capture. No ventricular high rate episodes. Estimated longevity 10.7 years. Plan to follow in 3 months remotely, to see in office annually.  ROV in July with Dr. Rayann Heman.

## 2013-06-26 DIAGNOSIS — L97409 Non-pressure chronic ulcer of unspecified heel and midfoot with unspecified severity: Secondary | ICD-10-CM

## 2013-06-26 DIAGNOSIS — I872 Venous insufficiency (chronic) (peripheral): Secondary | ICD-10-CM

## 2013-06-26 DIAGNOSIS — L02419 Cutaneous abscess of limb, unspecified: Secondary | ICD-10-CM

## 2013-06-26 DIAGNOSIS — L03119 Cellulitis of unspecified part of limb: Secondary | ICD-10-CM

## 2013-06-26 DIAGNOSIS — L97309 Non-pressure chronic ulcer of unspecified ankle with unspecified severity: Secondary | ICD-10-CM

## 2013-06-28 ENCOUNTER — Ambulatory Visit (INDEPENDENT_AMBULATORY_CARE_PROVIDER_SITE_OTHER): Payer: Commercial Managed Care - HMO | Admitting: Family Medicine

## 2013-06-28 ENCOUNTER — Encounter: Payer: Self-pay | Admitting: Family Medicine

## 2013-06-28 VITALS — BP 114/69 | HR 60 | Temp 97.6°F | Ht 59.0 in | Wt 149.6 lb

## 2013-06-28 DIAGNOSIS — R5383 Other fatigue: Secondary | ICD-10-CM

## 2013-06-28 DIAGNOSIS — I10 Essential (primary) hypertension: Secondary | ICD-10-CM

## 2013-06-28 DIAGNOSIS — R5381 Other malaise: Secondary | ICD-10-CM

## 2013-06-28 DIAGNOSIS — E785 Hyperlipidemia, unspecified: Secondary | ICD-10-CM

## 2013-06-28 LAB — POCT CBC
Granulocyte percent: 70.6 %G (ref 37–80)
HCT, POC: 39.1 % (ref 37.7–47.9)
Hemoglobin: 12.5 g/dL (ref 12.2–16.2)
Lymph, poc: 1.3 (ref 0.6–3.4)
MCH, POC: 28.8 pg (ref 27–31.2)
MCHC: 31.9 g/dL (ref 31.8–35.4)
MCV: 90.3 fL (ref 80–97)
MPV: 6.6 fL (ref 0–99.8)
POC Granulocyte: 4 (ref 2–6.9)
POC LYMPH PERCENT: 23.4 %L (ref 10–50)
Platelet Count, POC: 216 10*3/uL (ref 142–424)
RBC: 4.3 M/uL (ref 4.04–5.48)
RDW, POC: 13.6 %
WBC: 5.7 10*3/uL (ref 4.6–10.2)

## 2013-06-28 MED ORDER — SIMVASTATIN 20 MG PO TABS: 20.0000 mg | ORAL_TABLET | Freq: Every day | ORAL | Status: DC

## 2013-06-28 MED ORDER — FUROSEMIDE 20 MG PO TABS: 20.0000 mg | ORAL_TABLET | Freq: Every morning | ORAL | Status: DC

## 2013-06-28 NOTE — Progress Notes (Signed)
   Subjective:    Patient ID: [name], female    DOB: 1933/03/31, 80 y.o.   MRN: [id number]  HPI This 80 y.o. female presents for evaluation of routine follow up.  She has hx of chronic atrial fibrillation and she has not tolerated anticoagulation in past.  She is seen by cardiology. She has been in hospital 2 months ago for cellulitis and venous stasis dermatitis.  She has hx of CHB and s/p pacemaker.  She has bilateral unna boots and home health is checking her legs.  She has htn and diastolic dysfunction. She wears oxygen for chronic lung disease.  She has hx of hyperlipidemia .   Review of Systems No chest pain, SOB, HA, dizziness, vision change, N/V, diarrhea, constipation, dysuria, urinary urgency or frequency, myalgias, arthralgias or rash.     Objective:   Physical Exam  Vital signs noted  Chronically ill appearing female with walker and nasal cannula oxygen.  HEENT - Head atraumatic Normocephalic                Eyes - PERRLA, Conjuctiva - clear Sclera- Clear EOMI                Ears - EAC's Wnl TM's Wnl Gross Hearing WNL                Nose - Nares patent with nasal cannula oxygen                Throat - oropharanx wnl Respiratory - Lungs CTA bilateral Cardiac - RRR S1 and S2 without murmur GI - Abdomen soft Nontender and bowel sounds active x 4 Extremities - No edema. Neuro - Grossly intact.      Assessment & Plan:  Fatigue - Plan: POCT CBC, Thyroid Panel With TSH, CMP14+EGFR  Hyperlipemia - Plan: Lipid panel  Hypertension - Plan: POCT CBC, CMP14+EGFR  Follow up in 3 months

## 2013-06-29 LAB — CMP14+EGFR
ALT: 14 IU/L (ref 0–32)
AST: 20 IU/L (ref 0–40)
Albumin/Globulin Ratio: 1.5 (ref 1.1–2.5)
Albumin: 4.3 g/dL (ref 3.5–4.8)
Alkaline Phosphatase: 82 IU/L (ref 39–117)
BUN/Creatinine Ratio: 30 — ABNORMAL HIGH (ref 11–26)
BUN: 31 mg/dL — ABNORMAL HIGH (ref 8–27)
CO2: 26 mmol/L (ref 18–29)
Calcium: 9.7 mg/dL (ref 8.7–10.3)
Chloride: 101 mmol/L (ref 97–108)
Creatinine, Ser: 1.02 mg/dL — ABNORMAL HIGH (ref 0.57–1.00)
GFR calc Af Amer: 61 mL/min/{1.73_m2} (ref >59)
GFR calc non Af Amer: 53 mL/min/{1.73_m2} — ABNORMAL LOW (ref >59)
Globulin, Total: 2.8 g/dL (ref 1.5–4.5)
Glucose: 84 mg/dL (ref 65–99)
Potassium: 4.7 mmol/L (ref 3.5–5.2)
Sodium: 142 mmol/L (ref 134–144)
Total Bilirubin: 0.4 mg/dL (ref 0.0–1.2)
Total Protein: 7.1 g/dL (ref 6.0–8.5)

## 2013-06-29 LAB — LIPID PANEL
Chol/HDL Ratio: 3.8 ratio units (ref 0.0–4.4)
Cholesterol, Total: 234 mg/dL — ABNORMAL HIGH (ref 100–199)
HDL: 61 mg/dL (ref >39)
LDL Calculated: 154 mg/dL — ABNORMAL HIGH (ref 0–99)
Triglycerides: 94 mg/dL (ref 0–149)
VLDL Cholesterol Cal: 19 mg/dL (ref 5–40)

## 2013-06-29 LAB — THYROID PANEL WITH TSH
Free Thyroxine Index: 1.9 (ref 1.2–4.9)
T3 Uptake Ratio: 27 % (ref 24–39)
T4, Total: 7 ug/dL (ref 4.5–12.0)
TSH: 5.23 u[IU]/mL — ABNORMAL HIGH (ref 0.450–4.500)

## 2013-07-01 ENCOUNTER — Other Ambulatory Visit: Payer: Self-pay | Admitting: Family Medicine

## 2013-07-01 MED ORDER — LEVOTHYROXINE SODIUM 25 MCG PO TABS: 25.0000 ug | ORAL_TABLET | Freq: Every day | ORAL | Status: DC

## 2013-08-08 ENCOUNTER — Other Ambulatory Visit: Payer: Self-pay

## 2013-08-15 ENCOUNTER — Telehealth: Payer: Self-pay

## 2013-08-15 ENCOUNTER — Other Ambulatory Visit: Payer: Self-pay | Admitting: Family Medicine

## 2013-08-15 MED ORDER — CLINDAMYCIN HCL 300 MG PO CAPS: 300.0000 mg | ORAL_CAPSULE | Freq: Three times a day (TID) | ORAL | Status: DC

## 2013-08-15 NOTE — Telephone Encounter (Signed)
Patient has severe stasis ulcers that we have been wrapping with UNNA boots  Has an appointment with wound center Monday but has a ulcer on R leg that has an odor.  Was wondering if she could have an ABX till she gets to the wound clinic  Was on Clindomycin before.

## 2013-08-16 NOTE — Telephone Encounter (Signed)
x

## 2013-09-01 ENCOUNTER — Telehealth: Payer: Self-pay | Admitting: Family Medicine

## 2013-09-01 NOTE — Telephone Encounter (Signed)
I do  Not know what is going on with this patient

## 2013-09-02 ENCOUNTER — Other Ambulatory Visit: Payer: Self-pay | Admitting: Family Medicine

## 2013-09-02 NOTE — Telephone Encounter (Signed)
I do not see hydrocodone on her med list and she probably is getting this through the wound center for her legs

## 2013-09-02 NOTE — Telephone Encounter (Signed)
She is getting this through the wound center ,but they are closed

## 2013-09-03 NOTE — Telephone Encounter (Signed)
They need to use only one pain provider and the wound clinic is there pain provider

## 2013-09-05 NOTE — Telephone Encounter (Signed)
Pt notified and will call wound center

## 2013-09-09 ENCOUNTER — Encounter: Payer: Self-pay | Admitting: *Deleted

## 2013-09-26 DIAGNOSIS — L97809 Non-pressure chronic ulcer of other part of unspecified lower leg with unspecified severity: Secondary | ICD-10-CM

## 2013-09-26 DIAGNOSIS — I4891 Unspecified atrial fibrillation: Secondary | ICD-10-CM

## 2013-09-26 DIAGNOSIS — I872 Venous insufficiency (chronic) (peripheral): Secondary | ICD-10-CM

## 2013-09-26 DIAGNOSIS — M069 Rheumatoid arthritis, unspecified: Secondary | ICD-10-CM

## 2013-09-30 ENCOUNTER — Ambulatory Visit: Payer: Commercial Managed Care - HMO | Admitting: Family Medicine

## 2013-10-06 ENCOUNTER — Telehealth: Payer: Self-pay | Admitting: Family Medicine

## 2013-10-13 ENCOUNTER — Telehealth: Payer: Self-pay | Admitting: Family Medicine

## 2013-10-13 ENCOUNTER — Telehealth: Payer: Self-pay | Admitting: *Deleted

## 2013-10-13 NOTE — Telephone Encounter (Signed)
Her lower legs are very red, (just the fronts of both) Her ulcers are looking better. She says it burns. Can we try an antibiotic

## 2013-10-14 ENCOUNTER — Other Ambulatory Visit: Payer: Self-pay | Admitting: Family Medicine

## 2013-10-14 ENCOUNTER — Encounter: Payer: Self-pay | Admitting: Internal Medicine

## 2013-10-14 ENCOUNTER — Ambulatory Visit (INDEPENDENT_AMBULATORY_CARE_PROVIDER_SITE_OTHER): Payer: Commercial Managed Care - HMO | Admitting: Internal Medicine

## 2013-10-14 VITALS — BP 119/72 | HR 60 | Ht 59.0 in | Wt 144.0 lb

## 2013-10-14 DIAGNOSIS — Z720 Tobacco use: Secondary | ICD-10-CM

## 2013-10-14 DIAGNOSIS — I4821 Permanent atrial fibrillation: Secondary | ICD-10-CM

## 2013-10-14 DIAGNOSIS — I4891 Unspecified atrial fibrillation: Secondary | ICD-10-CM

## 2013-10-14 DIAGNOSIS — I442 Atrioventricular block, complete: Secondary | ICD-10-CM

## 2013-10-14 DIAGNOSIS — Z950 Presence of cardiac pacemaker: Secondary | ICD-10-CM

## 2013-10-14 LAB — MDC_IDC_ENUM_SESS_TYPE_INCLINIC
Battery Voltage: 2.98 V
Date Time Interrogation Session: 20150918103819
Implantable Pulse Generator Model: 1210
Lead Channel Impedance Value: 662.5 Ohm
Lead Channel Sensing Intrinsic Amplitude: 5.5 mV
Lead Channel Setting Sensing Sensitivity: 2.5 mV
MDC IDC MSMT BATTERY REMAINING LONGEVITY: 133.2 mo
MDC IDC MSMT LEADCHNL RV PACING THRESHOLD AMPLITUDE: 0.75 V
MDC IDC MSMT LEADCHNL RV PACING THRESHOLD PULSEWIDTH: 0.4 ms
MDC IDC PG SERIAL: 7245638
MDC IDC SET LEADCHNL RV PACING AMPLITUDE: 2.5 V
MDC IDC SET LEADCHNL RV PACING PULSEWIDTH: 0.4 ms
MDC IDC STAT BRADY RV PERCENT PACED: 99 %

## 2013-10-14 MED ORDER — APIXABAN 5 MG PO TABS: 5.0000 mg | ORAL_TABLET | Freq: Two times a day (BID) | ORAL | Status: DC

## 2013-10-14 MED ORDER — DOXYCYCLINE HYCLATE 100 MG PO TABS: 100.0000 mg | ORAL_TABLET | Freq: Two times a day (BID) | ORAL | Status: DC

## 2013-10-14 NOTE — Addendum Note (Signed)
Addended by: Merlene Laughter on: 10/14/2013 11:09 AM   Modules accepted: Orders, Medications

## 2013-10-14 NOTE — Patient Instructions (Addendum)
Your physician recommends that you schedule a follow-up appointment in: 1 year with Dr. Rayann Heman. You will receive a reminder letter in the mail in about 10 months reminding you to call and schedule your appointment. If you don't receive this letter, please contact our office. Merlin/device check 01/16/14. Your physician has recommended you make the following change in your medication:  Stop aspirin. Start eliquis 5 mg twice daily. Your physician recommends that you have lab work today to check your BMET and CBC. You are being referred to see Edrick Oh who will manage your eliquis.

## 2013-10-14 NOTE — Progress Notes (Addendum)
PCP: Redge Gainer, MD  The patient presents today for routine electrophysiology followup.  She appears stable. She is chronically debilitated with multiple comorbidities (see below).  She appears stable since her last visit. Her lung disease is stable.  Her edema is stable.  Today, she denies symptoms of palpitations, chest pain,  dizziness, presyncope, syncope, or neurologic sequela.  She denies bleeding. The patient feels that she is tolerating medications without difficulties and is otherwise without complaint today.   Past Medical History  Diagnosis Date  . Permanent atrial fibrillation   . Transient complete heart block     s/p PPM by Dr Rayann Heman  . Diastolic dysfunction   . Venous insufficiency     with chronic leg ulcers  . Obesity   . Pulmonary hypertension   . Chronic lung disease     on home O2 at night  . Debilitated   . Hypertension   . Sleep apnea   . Rheumatoid arthritis(714.0)    Past Surgical History  Procedure Laterality Date  . Pacemaker insertion  08/13/10    SJM by Dr Rayann Heman    Current Outpatient Prescriptions  Medication Sig Dispense Refill  . acetaminophen (TYLENOL EX ST ARTHRITIS PAIN) 500 MG tablet Take 500 mg by mouth every 6 (six) hours as needed for mild pain or moderate pain.      Marland Kitchen aspirin EC 325 MG tablet Take 325 mg by mouth every morning.      . carvedilol (COREG) 25 MG tablet Take 25 mg by mouth every morning.       . furosemide (LASIX) 20 MG tablet Take 1 tablet (20 mg total) by mouth every morning.  90 tablet  4  . levothyroxine (LEVOTHROID) 25 MCG tablet Take 1 tablet (25 mcg total) by mouth daily before breakfast.  30 tablet  11  . losartan (COZAAR) 100 MG tablet Take 100 mg by mouth every morning.       . simvastatin (ZOCOR) 20 MG tablet Take 1 tablet (20 mg total) by mouth daily.  30 tablet  4  . OXYGEN-HELIUM IN Inhale into the lungs continuous.       No current facility-administered medications for this visit.    No Known  Allergies  History   Social History  . Marital Status: Married    Spouse Name: N/A    Number of Children: N/A  . Years of Education: N/A   Occupational History  . Not on file.   Social History Main Topics  . Smoking status: Current Every Day Smoker  . Smokeless tobacco: Never Used  . Alcohol Use: No  . Drug Use: No  . Sexual Activity: Not on file   Other Topics Concern  . Not on file   Social History Narrative   Lives in [place] are reviewed and negative today  Physical Exam: Filed Vitals:   10/14/13 1026  BP: 119/72  Pulse: 60  Height: 4\' 11"  (1.499 m)  Weight: 144 lb (65.318 kg)  SpO2: 96%    GEN- The patient is obese and chronically ill appearing, alert and oriented x 3 today.   Head- normocephalic, atraumatic Eyes-  Sclera clear, conjunctiva pink Ears- hearing intact Oropharynx- clear Neck- supple, no JVP Lymph- no cervical lymphadenopathy Lungs- prolonged expiratory phase, + wheezing today, normal WOB Chest- pacemaker pocket is well healed Heart- Regular rate and rhythm (paced) GI- soft, NT, ND, + BS Extremities- no clubbing, cyanosis, 3+ edema  with venous stasis changes/ wounds  Pacemaker interrogation- reviewed in detail today,  See PACEART report  Assessment and Plan:  1. Permanent afib Stop ASA Start eliquis 5mg  BID chads2vasc score is at least 6 Bmet, cbc today Return to see Lattie Haw in the anticoagulation clinic in 4 weeks and again in 6 months  2. Complete heart block Normal pacemaker function See Pace Art report No changes today  3. Chronic venous insufficiency Improved  Merlin Return in 1year

## 2013-10-14 NOTE — Telephone Encounter (Signed)
Patient got referral that was needed

## 2013-10-18 ENCOUNTER — Telehealth: Payer: Self-pay | Admitting: *Deleted

## 2013-10-18 NOTE — Telephone Encounter (Signed)
Message copied by Merlene Laughter on Tue Oct 18, 2013  8:49 AM ------      Message from: Thompson Grayer      Created: Mon Oct 17, 2013  5:29 PM       Alma Friendly, please inform patient of result             ------

## 2013-10-18 NOTE — Telephone Encounter (Signed)
Patient informed. 

## 2013-11-26 DIAGNOSIS — M069 Rheumatoid arthritis, unspecified: Secondary | ICD-10-CM

## 2013-11-26 DIAGNOSIS — L97829 Non-pressure chronic ulcer of other part of left lower leg with unspecified severity: Secondary | ICD-10-CM

## 2013-11-26 DIAGNOSIS — I872 Venous insufficiency (chronic) (peripheral): Secondary | ICD-10-CM

## 2013-11-26 DIAGNOSIS — L97819 Non-pressure chronic ulcer of other part of right lower leg with unspecified severity: Secondary | ICD-10-CM

## 2013-11-29 ENCOUNTER — Other Ambulatory Visit: Payer: Self-pay | Admitting: *Deleted

## 2013-11-29 MED ORDER — SIMVASTATIN 20 MG PO TABS: 20.0000 mg | ORAL_TABLET | Freq: Every day | ORAL | Status: DC

## 2014-01-16 ENCOUNTER — Encounter: Payer: Commercial Managed Care - HMO | Admitting: *Deleted

## 2014-01-16 ENCOUNTER — Encounter: Payer: Self-pay | Admitting: Internal Medicine

## 2014-01-16 LAB — MDC_IDC_ENUM_SESS_TYPE_REMOTE
Battery Voltage: 2.98 V
Brady Statistic RV Percent Paced: 95 %
Date Time Interrogation Session: 20151221075834
Implantable Pulse Generator Model: 1210
Lead Channel Impedance Value: 690 Ohm
Lead Channel Sensing Intrinsic Amplitude: 6.4 mV
Lead Channel Setting Pacing Amplitude: 2.5 V
Lead Channel Setting Pacing Pulse Width: 0.4 ms
MDC IDC MSMT BATTERY REMAINING LONGEVITY: 124 mo
MDC IDC MSMT BATTERY REMAINING PERCENTAGE: 91 %
MDC IDC MSMT LEADCHNL RV PACING THRESHOLD AMPLITUDE: 0.75 V
MDC IDC MSMT LEADCHNL RV PACING THRESHOLD PULSEWIDTH: 0.4 ms
MDC IDC PG SERIAL: 7245638
MDC IDC SET LEADCHNL RV SENSING SENSITIVITY: 2.5 mV

## 2014-01-25 ENCOUNTER — Other Ambulatory Visit: Payer: Self-pay | Admitting: Family Medicine

## 2014-01-26 DIAGNOSIS — I872 Venous insufficiency (chronic) (peripheral): Secondary | ICD-10-CM

## 2014-01-26 DIAGNOSIS — L97819 Non-pressure chronic ulcer of other part of right lower leg with unspecified severity: Secondary | ICD-10-CM

## 2014-01-26 DIAGNOSIS — M069 Rheumatoid arthritis, unspecified: Secondary | ICD-10-CM

## 2014-01-26 DIAGNOSIS — L97829 Non-pressure chronic ulcer of other part of left lower leg with unspecified severity: Secondary | ICD-10-CM

## 2014-02-02 ENCOUNTER — Encounter: Payer: Self-pay | Admitting: Cardiology

## 2014-02-06 ENCOUNTER — Ambulatory Visit: Payer: Self-pay | Admitting: Family Medicine

## 2014-02-10 ENCOUNTER — Other Ambulatory Visit: Payer: Self-pay | Admitting: Internal Medicine

## 2014-02-20 ENCOUNTER — Other Ambulatory Visit: Payer: Self-pay | Admitting: Family Medicine

## 2014-03-19 ENCOUNTER — Other Ambulatory Visit: Payer: Self-pay | Admitting: Family Medicine

## 2014-03-24 ENCOUNTER — Telehealth: Payer: Self-pay | Admitting: Internal Medicine

## 2014-03-24 ENCOUNTER — Encounter: Payer: Self-pay | Admitting: Family Medicine

## 2014-03-24 ENCOUNTER — Ambulatory Visit (INDEPENDENT_AMBULATORY_CARE_PROVIDER_SITE_OTHER): Payer: Commercial Managed Care - HMO | Admitting: Family Medicine

## 2014-03-24 VITALS — BP 109/60 | HR 69 | Temp 96.7°F | Ht 59.0 in | Wt 150.0 lb

## 2014-03-24 DIAGNOSIS — E034 Atrophy of thyroid (acquired): Secondary | ICD-10-CM | POA: Diagnosis not present

## 2014-03-24 DIAGNOSIS — E038 Other specified hypothyroidism: Secondary | ICD-10-CM

## 2014-03-24 DIAGNOSIS — I8311 Varicose veins of right lower extremity with inflammation: Secondary | ICD-10-CM

## 2014-03-24 DIAGNOSIS — L03119 Cellulitis of unspecified part of limb: Secondary | ICD-10-CM

## 2014-03-24 DIAGNOSIS — I519 Heart disease, unspecified: Secondary | ICD-10-CM

## 2014-03-24 DIAGNOSIS — I8312 Varicose veins of left lower extremity with inflammation: Secondary | ICD-10-CM

## 2014-03-24 DIAGNOSIS — E039 Hypothyroidism, unspecified: Secondary | ICD-10-CM | POA: Insufficient documentation

## 2014-03-24 DIAGNOSIS — I872 Venous insufficiency (chronic) (peripheral): Secondary | ICD-10-CM

## 2014-03-24 DIAGNOSIS — I5189 Other ill-defined heart diseases: Secondary | ICD-10-CM

## 2014-03-24 LAB — POCT CBC
GRANULOCYTE PERCENT: 74 % (ref 37–80)
HEMATOCRIT: 31 % — AB (ref 37.7–47.9)
Hemoglobin: 9.1 g/dL — AB (ref 12.2–16.2)
Lymph, poc: 1.8 (ref 0.6–3.4)
MCH: 24.9 pg — AB (ref 27–31.2)
MCHC: 29.6 g/dL — AB (ref 31.8–35.4)
MCV: 84.4 fL (ref 80–97)
MPV: 5.6 fL (ref 0–99.8)
POC Granulocyte: 5.7 (ref 2–6.9)
POC LYMPH PERCENT: 22.8 %L (ref 10–50)
Platelet Count, POC: 400 10*3/uL (ref 142–424)
RBC: 3.67 M/uL — AB (ref 4.04–5.48)
RDW, POC: 17 %
WBC: 7.7 10*3/uL (ref 4.6–10.2)

## 2014-03-24 MED ORDER — CLINDAMYCIN HCL 300 MG PO CAPS: 300.0000 mg | ORAL_CAPSULE | Freq: Three times a day (TID) | ORAL | Status: DC

## 2014-03-24 MED ORDER — TRAMADOL HCL 50 MG PO TABS: ORAL_TABLET | ORAL | Status: DC

## 2014-03-24 NOTE — Addendum Note (Signed)
Addended by: Zannie Cove on: 03/24/2014 03:22 PM   Modules accepted: Orders

## 2014-03-24 NOTE — Patient Instructions (Signed)
The dressings on the patient's legs should continue to be changed by the home health nurse She should restart her blood thinner She should increase her Lasix to 40 mg daily for the next 5 days and then go back to 20 mg daily Continue to monitor weights daily the first thing in the morning We will arrange for the patient to see the wound Center at [facility] in [place] Take antibiotic as directed Have home health nurse check a BMP in 5-7 days

## 2014-03-24 NOTE — Progress Notes (Signed)
Subjective:    Patient ID: [name], female    DOB: 07/14/1933, 81 y.o.   MRN: [id number]  HPI Patient here today for bilateral leg infections. She has battled these infections for approximately 13 years. She is accompanied today by her two daughters. The patient was hospitalized in the spring of last year for severe cellulitis of both lower extremities. She has gained 6 pounds of weight since she was seen by Dr. Rayann Heman in September. She has a history of permanent atrial fibrillation, pacemaker and diastolic dysfunction. He was also noted today that she has stopped taking her blood thinner without her daughter's knowledge. Some the last lab work she had had an elevated TSH and this will be rechecked again today. When she was in the hospital she was treated with clindamycin. The patient has been followed by the home health nurse also.         Patient Active Problem List   Diagnosis Date Noted  . Stasis dermatitis of both legs 05/06/2013  . Cellulitis 05/05/2013  . Pacemaker-St.[employer]ude 11/25/2011  . Complete heart block 12/12/2010  . Permanent atrial fibrillation 12/12/2010  . Diastolic dysfunction 03/55/9741  . Venous insufficiency 12/12/2010   Outpatient Encounter Prescriptions as of 03/24/2014  Medication Sig  . acetaminophen (TYLENOL EX ST ARTHRITIS PAIN) 500 MG tablet Take 500 mg by mouth every 6 (six) hours as needed for mild pain or moderate pain.  . carvedilol (COREG) 25 MG tablet Take 25 mg by mouth every morning.   . furosemide (LASIX) 20 MG tablet Take 1 tablet (20 mg total) by mouth every morning.  Marland Kitchen levothyroxine (LEVOTHROID) 25 MCG tablet Take 1 tablet (25 mcg total) by mouth daily before breakfast.  . losartan (COZAAR) 100 MG tablet Take 100 mg by mouth every morning.   Donell Sievert IN Inhale into the lungs continuous.  . simvastatin (ZOCOR) 20 MG tablet TAKE ONE TABLET BY MOUTH ONE TIME DAILY  . [DISCONTINUED] doxycycline (VIBRA-TABS) 100 MG tablet Take 1  tablet (100 mg total) by mouth 2 (two) times daily.  . [DISCONTINUED] ELIQUIS 5 MG TABS tablet TAKE ONE TABLET BY MOUTH TWICE A DAY AS DIRECTED    Review of Systems  Constitutional: Negative.   HENT: Negative.   Eyes: Negative.   Respiratory: Negative.   Cardiovascular: Negative.   Gastrointestinal: Negative.   Endocrine: Negative.   Genitourinary: Negative.   Musculoskeletal: Negative.   Skin: Negative.        Bilateral lower leg infections  Allergic/Immunologic: Negative.   Neurological: Negative.   Hematological: Negative.   Psychiatric/Behavioral: Negative.        Objective:   Physical Exam  Constitutional: She is oriented to person, place, and time. No distress.  The patient is elderly, frail but alert and is accompanied by HER-2 daughters to the visit today. She lives at home with her husband.  HENT:  Head: Normocephalic and atraumatic.  Eyes: Conjunctivae and EOM are normal. Pupils are equal, round, and reactive to light. Right eye exhibits no discharge. Left eye exhibits no discharge. No scleral icterus.  Neck: Normal range of motion.  Cardiovascular: Normal rate and normal heart sounds.   No murmur heard. The heart is irregular irregular at 63/A with diastolic murmur grade 3/6  Pulmonary/Chest: Effort normal and breath sounds normal. No respiratory distress. She has no wheezes. She has no rales. She exhibits no tenderness.  Abdominal: Soft. Bowel sounds are normal. She exhibits no mass. There is no tenderness. There is no rebound  and no guarding.  Musculoskeletal: Normal range of motion. She exhibits edema and tenderness.  There is no presacral edema but she has pitting edema above the dressings on her lower legs to 3-4+. Bilaterally.  Neurological: She is alert and oriented to person, place, and time.  Skin: Skin is warm and dry. Rash noted. There is erythema. There is pallor.  The patient appears somewhat pale. The lower extremities were undressed and cleansed and  redressed with Adaptic and pressure dressings followed by an Ace bandage.  Psychiatric: She has a normal mood and affect. Her behavior is normal. Judgment and thought content normal.  Nursing note and vitals reviewed.  BP 109/60 mmHg  Pulse 69  Temp(Src) 96.7 F (35.9 C) (Oral)  Ht 4' 11"  (1.499 m)  Wt 150 lb (68.04 kg)  BMI 30.28 kg/m2  Greater than an hour's period of time was spent face-to-face cleaning her wounds redressing her wounds and applying the Ace bandage.      Assessment & Plan:  1. Venous insufficiency -Continue to apply pressure dressings with home health nurse - AMB referral to wound care center - POCT CBC - BMP8+EGFR - Thyroid Panel With TSH  2. Stasis dermatitis of both legs -Take antibiotic as directed and continue pressure dressing - AMB referral to wound care center  3. Cellulitis of lower extremity, unspecified laterality -Take antibiotic regularly and prop up legs when possible during the day - AMB referral to wound care center - clindamycin (CLEOCIN) 300 MG capsule; Take 1 capsule (300 mg total) by mouth 3 (three) times daily.  Dispense: 30 capsule; Refill: 0  4. Diastolic dysfunction -Take extra Lasix for the next 5 days which is 40 mg daily and then go back to 20 mg daily -Repeat BMP by home health and 5-7 days - POCT CBC - BMP8+EGFR - Thyroid Panel With TSH - Brain natriuretic peptide  5. Hypothyroidism due to acquired atrophy of thyroid -The patient should continue with her current thyroid medication and because the last TSH was increased we will recheck it today to see if thyroid needs to be adjusted - POCT CBC - BMP8+EGFR - Thyroid Panel With TSH  Patient Instructions  The dressings on the patient's legs should continue to be changed by the home health nurse She should restart her blood thinner She should increase her Lasix to 40 mg daily for the next 5 days and then go back to 20 mg daily Continue to monitor weights daily the first  thing in the morning We will arrange for the patient to see the wound Center at [facility] in [place] Take antibiotic as directed Have home health nurse check a BMP in 5-7 days    Arrie Senate MD

## 2014-03-24 NOTE — Telephone Encounter (Signed)
Daughter called to let us know that the patient stopped taking her Eliqis she stopped about 3-4 weeks due to the cost of the medication

## 2014-03-25 LAB — BMP8+EGFR
BUN/Creatinine Ratio: 15 (ref 11–26)
BUN: 21 mg/dL (ref 8–27)
CHLORIDE: 98 mmol/L (ref 97–108)
CO2: 27 mmol/L (ref 18–29)
CREATININE: 1.4 mg/dL — AB (ref 0.57–1.00)
Calcium: 8.4 mg/dL — ABNORMAL LOW (ref 8.7–10.3)
GFR calc Af Amer: 42 mL/min/{1.73_m2} — ABNORMAL LOW (ref >59)
GFR calc non Af Amer: 36 mL/min/{1.73_m2} — ABNORMAL LOW (ref >59)
Glucose: 97 mg/dL (ref 65–99)
POTASSIUM: 4.5 mmol/L (ref 3.5–5.2)
Sodium: 142 mmol/L (ref 134–144)

## 2014-03-25 LAB — THYROID PANEL WITH TSH
Free Thyroxine Index: 1.9 (ref 1.2–4.9)
T3 UPTAKE RATIO: 32 % (ref 24–39)
T4 TOTAL: 5.9 ug/dL (ref 4.5–12.0)
TSH: 5.44 u[IU]/mL — ABNORMAL HIGH (ref 0.450–4.500)

## 2014-03-25 LAB — BRAIN NATRIURETIC PEPTIDE: BNP: 273.1 pg/mL — AB (ref 0.0–100.0)

## 2014-03-28 ENCOUNTER — Other Ambulatory Visit: Payer: Self-pay | Admitting: *Deleted

## 2014-03-28 MED ORDER — LEVOTHYROXINE SODIUM 50 MCG PO TABS: 50.0000 ug | ORAL_TABLET | Freq: Every day | ORAL | Status: DC

## 2014-03-28 NOTE — Telephone Encounter (Signed)
Dose on levothyroxine changed per Dr.Moores note on labs.

## 2014-03-31 NOTE — Telephone Encounter (Signed)
Due to the cost of eliquis, patient stopped taking it. FYI

## 2014-04-01 NOTE — Telephone Encounter (Signed)
Given elevated chads2vasc score I am worried about stroke risks. [name], please call patient and offer coumadin.  Also see if she is a candidate for assistance program with eliquis or xarelto.

## 2014-04-03 NOTE — Telephone Encounter (Signed)
Alma Friendly, Will you call patient and see which way she wants to go then set her up an appt with me. Thanks, Lattie Haw

## 2014-04-04 NOTE — Telephone Encounter (Signed)
Spoke with patient and she is willing to go back on coumadin. Patient set up for new patient in the coumadin clinic this Thursday.

## 2014-04-06 ENCOUNTER — Encounter: Payer: Self-pay | Admitting: *Deleted

## 2014-04-07 ENCOUNTER — Telehealth: Payer: Self-pay | Admitting: Family Medicine

## 2014-04-09 ENCOUNTER — Other Ambulatory Visit: Payer: Self-pay | Admitting: Family Medicine

## 2014-04-10 NOTE — Telephone Encounter (Signed)
Last seen and filled on 03/24/14

## 2014-04-12 DIAGNOSIS — J9611 Chronic respiratory failure with hypoxia: Secondary | ICD-10-CM | POA: Insufficient documentation

## 2014-04-12 DIAGNOSIS — I890 Lymphedema, not elsewhere classified: Secondary | ICD-10-CM | POA: Insufficient documentation

## 2014-04-12 DIAGNOSIS — L97909 Non-pressure chronic ulcer of unspecified part of unspecified lower leg with unspecified severity: Secondary | ICD-10-CM | POA: Insufficient documentation

## 2014-04-12 DIAGNOSIS — E639 Nutritional deficiency, unspecified: Secondary | ICD-10-CM | POA: Insufficient documentation

## 2014-04-17 ENCOUNTER — Ambulatory Visit (INDEPENDENT_AMBULATORY_CARE_PROVIDER_SITE_OTHER): Payer: Commercial Managed Care - HMO | Admitting: *Deleted

## 2014-04-17 ENCOUNTER — Other Ambulatory Visit: Payer: Self-pay | Admitting: Family Medicine

## 2014-04-17 DIAGNOSIS — I442 Atrioventricular block, complete: Secondary | ICD-10-CM

## 2014-04-17 LAB — MDC_IDC_ENUM_SESS_TYPE_REMOTE
Battery Remaining Longevity: 123 mo
Brady Statistic RV Percent Paced: 84 %
Date Time Interrogation Session: 20160321070920
Implantable Pulse Generator Model: 1210
Implantable Pulse Generator Serial Number: 7245638
Lead Channel Impedance Value: 630 Ohm
Lead Channel Pacing Threshold Amplitude: 0.75 V
Lead Channel Sensing Intrinsic Amplitude: 4.1 mV
Lead Channel Setting Pacing Amplitude: 2.5 V
MDC IDC MSMT BATTERY REMAINING PERCENTAGE: 91 %
MDC IDC MSMT BATTERY VOLTAGE: 2.98 V
MDC IDC MSMT LEADCHNL RV PACING THRESHOLD PULSEWIDTH: 0.4 ms
MDC IDC SET LEADCHNL RV PACING PULSEWIDTH: 0.4 ms
MDC IDC SET LEADCHNL RV SENSING SENSITIVITY: 2.5 mV

## 2014-04-17 NOTE — Progress Notes (Signed)
Remote pacemaker transmission.   

## 2014-04-20 ENCOUNTER — Telehealth: Payer: Self-pay | Admitting: Family Medicine

## 2014-04-20 NOTE — Telephone Encounter (Signed)
Pt given appt 3/28 at 4.

## 2014-04-24 ENCOUNTER — Ambulatory Visit (INDEPENDENT_AMBULATORY_CARE_PROVIDER_SITE_OTHER): Payer: Commercial Managed Care - HMO | Admitting: Family Medicine

## 2014-04-24 ENCOUNTER — Encounter: Payer: Self-pay | Admitting: Family Medicine

## 2014-04-24 VITALS — BP 103/64 | HR 59 | Temp 98.6°F | Ht 59.0 in | Wt 139.0 lb

## 2014-04-24 DIAGNOSIS — D509 Iron deficiency anemia, unspecified: Secondary | ICD-10-CM

## 2014-04-24 DIAGNOSIS — I872 Venous insufficiency (chronic) (peripheral): Secondary | ICD-10-CM

## 2014-04-24 DIAGNOSIS — I442 Atrioventricular block, complete: Secondary | ICD-10-CM

## 2014-04-24 DIAGNOSIS — I959 Hypotension, unspecified: Secondary | ICD-10-CM

## 2014-04-24 NOTE — Progress Notes (Signed)
Subjective:    Patient ID: [name], female    DOB: [birth year], 81 y.o.   MRN: [id number]  HPI Patient here today for hospital follow up from [facility]. She was seen at the ED for hypotension last week. The patient has a history of complete heart block and permanent atrial fibrillation. She presented recently to the emergency room at [facility] and has now stopped her carvedilol and low Sartin because of the low blood pressure readings.        Patient Active Problem List   Diagnosis Date Noted  . Hypothyroidism 03/24/2014  . Stasis dermatitis of both legs 05/06/2013  . Cellulitis 05/05/2013  . Pacemaker-St.Jude 11/25/2011  . Complete heart block 12/12/2010  . Permanent atrial fibrillation 12/12/2010  . Diastolic dysfunction 78/29/5621  . Venous insufficiency 12/12/2010   Outpatient Encounter Prescriptions as of 04/24/2014  Medication Sig  . acetaminophen (TYLENOL EX ST ARTHRITIS PAIN) 500 MG tablet Take 500 mg by mouth every 6 (six) hours as needed for mild pain or moderate pain.  Marland Kitchen apixaban (ELIQUIS) 5 MG TABS tablet Take 5 mg by mouth 2 (two) times daily.  . clindamycin (CLEOCIN) 300 MG capsule Take 1 capsule (300 mg total) by mouth 3 (three) times daily.  . furosemide (LASIX) 20 MG tablet Take 1 tablet (20 mg total) by mouth every morning.  Marland Kitchen levothyroxine (SYNTHROID, LEVOTHROID) 50 MCG tablet Take 1 tablet (50 mcg total) by mouth daily.  Donell Sievert IN Inhale into the lungs continuous.  . simvastatin (ZOCOR) 20 MG tablet TAKE ONE TABLET BY MOUTH ONE TIME DAILY  . traMADol (ULTRAM) 50 MG tablet TAKE 1/2 TO 1 TABLET TWICE DAILY AS NEEDED  . [DISCONTINUED] carvedilol (COREG) 25 MG tablet Take 25 mg by mouth every morning.   . [DISCONTINUED] losartan (COZAAR) 100 MG tablet Take 100 mg by mouth every morning.     Review of Systems  Constitutional: Negative.   HENT: Negative.   Eyes: Negative.   Respiratory: Negative.   Cardiovascular: Negative.     Gastrointestinal: Negative.   Endocrine: Negative.   Genitourinary: Negative.   Musculoskeletal: Negative.   Skin: Negative.   Allergic/Immunologic: Negative.   Neurological: Negative.   Hematological: Negative.   Psychiatric/Behavioral: Negative.        Objective:   Physical Exam  Constitutional: She is oriented to person, place, and time. No distress.  The patient is elderly somewhat kyphotic but alert and somewhat pale in color  HENT:  Head: Normocephalic and atraumatic.  Nose: Nose normal.  Eyes: Conjunctivae and EOM are normal. Pupils are equal, round, and reactive to light. Right eye exhibits no discharge. Left eye exhibits no discharge. No scleral icterus.  Neck: Normal range of motion. Neck supple. No thyromegaly present.  Cardiovascular: Normal rate, regular rhythm and normal heart sounds.   Pulmonary/Chest: Effort normal and breath sounds normal. No respiratory distress. She has no wheezes. She has no rales. She exhibits no tenderness.  Clear anteriorly and posteriorly  Abdominal: Soft. Bowel sounds are normal. She exhibits no mass. There is no tenderness. There is no rebound and no guarding.  Specifically no abdominal tenderness  Musculoskeletal: She exhibits no edema or tenderness.  The patient is weak and comes in with O2 in a wheelchair.  Lymphadenopathy:    She has no cervical adenopathy.  Neurological: She is alert and oriented to person, place, and time.  Skin: Skin is warm and dry. No rash noted. There is pallor.  Psychiatric: She has a normal  mood and affect. Her behavior is normal. Judgment and thought content normal.  The patient is calm and alert.  Nursing note and vitals reviewed.  BP 103/64 mmHg  Pulse 59  Temp(Src) 98.6 F (37 C) (Oral)  Ht 4\' 11"  (1.499 m)  Wt 139 lb (63.05 kg)  BMI 28.06 kg/m2        Assessment & Plan:  1. Anemia, iron deficiency -The patient has a hemoglobin of 8 and she is only taking a multivitamin with iron in it. Her  serum iron and ferritin levels were low normal on her recent blood work from [facility]. -We will start her on oral iron preparation that her insurance will pay for -We will ask for her to get a repeat CBC in 4 weeks  2. Venous insufficiency -Continue wound care at [facility]  3. Hypotension, unspecified hypotension type -The patient is off her carvedilol and low Sartin and we will arrange for her to see the cardiologist again for further follow-up of the hypotension  4. Complete heart block -This is being followed by the cardiologist and she does have a pacemaker in. Patient Instructions  The patient should return the FOBT We will make arrangements for her to see the cardiologist that normally sees her in [place], [place] As soon as she collects the FOBT she should start the iron preparation that were calling in for her and take one daily Follow-up with wound specialist   Arrie Senate MD

## 2014-04-24 NOTE — Patient Instructions (Addendum)
The patient should return the FOBT We will make arrangements for her to see the cardiologist that normally sees her in [place], [place] As soon as she collects the FOBT she should start the iron preparation that were calling in for her and take one daily Follow-up with wound specialist

## 2014-04-25 ENCOUNTER — Other Ambulatory Visit: Payer: Commercial Managed Care - HMO

## 2014-04-25 DIAGNOSIS — Z1212 Encounter for screening for malignant neoplasm of rectum: Secondary | ICD-10-CM

## 2014-04-25 MED ORDER — FERROUS SULFATE 325 (65 FE) MG PO TBEC: 325.0000 mg | DELAYED_RELEASE_TABLET | Freq: Three times a day (TID) | ORAL | Status: DC

## 2014-04-25 NOTE — Progress Notes (Signed)
Lab only 

## 2014-04-25 NOTE — Addendum Note (Signed)
Addended by: Zannie Cove on: 04/25/2014 12:18 PM   Modules accepted: Orders

## 2014-04-28 ENCOUNTER — Ambulatory Visit (INDEPENDENT_AMBULATORY_CARE_PROVIDER_SITE_OTHER): Payer: Commercial Managed Care - HMO | Admitting: Internal Medicine

## 2014-04-28 ENCOUNTER — Encounter: Payer: Self-pay | Admitting: Internal Medicine

## 2014-04-28 VITALS — BP 100/58 | HR 69 | Ht 59.0 in | Wt 138.0 lb

## 2014-04-28 DIAGNOSIS — I4821 Permanent atrial fibrillation: Secondary | ICD-10-CM

## 2014-04-28 DIAGNOSIS — I872 Venous insufficiency (chronic) (peripheral): Secondary | ICD-10-CM

## 2014-04-28 DIAGNOSIS — D649 Anemia, unspecified: Secondary | ICD-10-CM

## 2014-04-28 DIAGNOSIS — Z950 Presence of cardiac pacemaker: Secondary | ICD-10-CM

## 2014-04-28 DIAGNOSIS — I950 Idiopathic hypotension: Secondary | ICD-10-CM | POA: Diagnosis not present

## 2014-04-28 DIAGNOSIS — I442 Atrioventricular block, complete: Secondary | ICD-10-CM | POA: Diagnosis not present

## 2014-04-28 DIAGNOSIS — I482 Chronic atrial fibrillation: Secondary | ICD-10-CM | POA: Diagnosis not present

## 2014-04-28 DIAGNOSIS — I959 Hypotension, unspecified: Secondary | ICD-10-CM

## 2014-04-28 LAB — FECAL OCCULT BLOOD, IMMUNOCHEMICAL: Fecal Occult Bld: NEGATIVE

## 2014-04-28 NOTE — Patient Instructions (Addendum)
Your physician recommends that you schedule a follow-up appointment in: 1-2 weeks with a cardiologists. Your physician recommends that you continue on your current medications as directed. Please refer to the Current Medication list given to you today. Your physician has requested that you have an echocardiogram. Echocardiography is a painless test that uses sound waves to create images of your heart. It provides your doctor with information about the size and shape of your heart and how well your heart's chambers and valves are working. This procedure takes approximately one hour. There are no restrictions for this procedure.

## 2014-04-28 NOTE — Progress Notes (Signed)
Electrophysiology Office Note   Date:  04/28/2014   ID:  MATEYA TORTI, DOB [birth year], MRN [id number]  PCP:  Redge Gainer, MD   Primary Electrophysiologist: Thompson Grayer, MD    Chief Complaint  Patient presents with  . Hypotension     History of Present Illness: [name] is a 81 y.o. female who presents today for electrophysiology evaluation.   She has recently developed hypotension.  Her daughter reports that with routine wound care recently, she was noted to have a low BP.  She was therefore referred to [facility].  She was found to be hypotensive and therefore her beta blocker and ace inhibitor were discontinued.  She was anemic with Hb of 8.  She has had no real symptoms with her hypotension.  She has stable fatigue.  Her edema is stable.  She has not noticed any bleeding.  Hemaccult cards were ordered by Dr Laurance Flatten. Today, she denies symptoms of palpitations, chest pain, shortness of breath, orthopnea, PND, claudication, dizziness, presyncope, syncope, or neurologic sequela. The patient is tolerating medications without difficulties and is otherwise without complaint today.    Past Medical History  Diagnosis Date  . Permanent atrial fibrillation   . Complete heart block     s/p PPM by Dr Rayann Heman  . Diastolic dysfunction   . Venous insufficiency     with chronic leg ulcers  . Obesity   . Pulmonary hypertension   . Chronic lung disease     on home O2   . Debilitated   . Hypertension   . Sleep apnea   . Rheumatoid arthritis(714.0)   . Stroke     remote   Past Surgical History  Procedure Laterality Date  . Pacemaker insertion  08/13/10    SJM by Dr Rayann Heman     Current Outpatient Prescriptions  Medication Sig Dispense Refill  . acetaminophen (TYLENOL EX ST ARTHRITIS PAIN) 500 MG tablet Take 500 mg by mouth every 6 (six) hours as needed for mild pain or moderate pain.    Marland Kitchen apixaban (ELIQUIS) 5 MG TABS tablet Take 5 mg by mouth 2 (two) times daily.    .  clindamycin (CLEOCIN) 300 MG capsule Take 1 capsule (300 mg total) by mouth 3 (three) times daily. 30 capsule 0  . ferrous sulfate 325 (65 FE) MG EC tablet Take 1 tablet (325 mg total) by mouth 3 (three) times daily with meals. 30 tablet 3  . furosemide (LASIX) 20 MG tablet Take 1 tablet (20 mg total) by mouth every morning. 90 tablet 4  . levothyroxine (SYNTHROID, LEVOTHROID) 50 MCG tablet Take 1 tablet (50 mcg total) by mouth daily. 90 tablet 1  . OXYGEN-HELIUM IN Inhale into the lungs continuous.    . simvastatin (ZOCOR) 20 MG tablet TAKE ONE TABLET BY MOUTH ONE TIME DAILY 30 tablet 0  . traMADol (ULTRAM) 50 MG tablet TAKE 1/2 TO 1 TABLET TWICE DAILY AS NEEDED 30 tablet 0   No current facility-administered medications for this visit.    Allergies:   Review of patient's allergies indicates no known allergies.   Social History:  The patient  reports that she has never smoked. She has never used smokeless tobacco. She reports that she does not drink alcohol or use illicit drugs.   Family History:  The patients father had CHF.   ROS:  Please see the history of present illness.   All other systems are reviewed and negative.    PHYSICAL EXAM: VS:  BP  100/58 mmHg  Pulse 69  Ht 4\' 11"  (1.499 m)  Wt 138 lb (62.596 kg)  BMI 27.86 kg/m2  SpO2 98% , BMI Body mass index is 27.86 kg/(m^2). GEN: ill appearing, in no acute distress HEENT: conjunctiva is pale, moist MM Neck: no JVD, carotid bruits, or masses Cardiac: iRRR; 2/6 SEM LSB,  +2 chronic edema with legs wrapped today Respiratory:  clear to auscultation bilaterally, normal work of breathing GI: soft, nontender, nondistended, + BS MS: no deformity or atrophy Skin: sallow appearing Neuro:  Strength and sensation are intact Psych: euthymic mood, full affect  EKG:  EKG is ordered today. The ekg ordered today shows afib, V pacing  Recent remote Device interrogation is reviewed today in detail.  See PaceArt for details.   Recent  Labs: 05/06/2013: Platelets 204 06/28/2013: ALT 14 03/24/2014: B Natriuretic Peptide 273.1*; BUN 21; Creatinine 1.40*; Hemoglobin 9.1*; Potassium 4.5; Sodium 142; TSH 5.440*    Lipid Panel     Component Value Date/Time   CHOL 234* 06/28/2013 1018   TRIG 94 06/28/2013 1018   HDL 61 06/28/2013 1018   CHOLHDL 3.8 06/28/2013 1018   LDLCALC 154* 06/28/2013 1018     Wt Readings from Last 3 Encounters:  04/28/14 138 lb (62.596 kg)  04/24/14 139 lb (63.05 kg)  03/24/14 150 lb (68.04 kg)      Other studies Reviewed: Additional studies/ records that were reviewed today include: labs from [facility] 04/19/14 Review of the above records today demonstrates:  Hb 8, hct 25, WBC 8, Plt 362, K 4.9, Na 138, BUN 30, Cr 1.4, Gl 96, Lactate 0.9, UA normal, Ferritin 408   ASSESSMENT AND PLAN:  1.  Hypotension Likely multifactoral I suspect that anemia is playing a big part Could also be related to chronic diuretic use for venous insufficiency Will obtain an echo to evaluate for structural heart issues Continue to hold ace inhibitor and beta blocker  2. Permanent atrial fibrillation chads2vasc score is at least 5 Continue eliquis for now If hemaccult is positive, would stop eliquis Given prior stroke, I am reluctant to stop this today  3. Symptomatic complete heart block Recent remote transmission is reviewed and reveals normal ppm function  4. Venous insufficiency Would consider holding lasix if Bun/Cr or hypotension worsen 2 gram sodium restriction  Return for echo next week Follow-up with general cardiology afterwards I will see in EP clinic as scheduled  Current medicines are reviewed at length with the patient today.   The patient does not have concerns regarding her medicines.  The following changes were made today:  none  Labs/ tests ordered today include:  Orders Placed This Encounter  Procedures  . EKG 12-Lead   Signed, Thompson Grayer, MD  04/28/2014 1:14 PM     [address] [place] [address] [phone] (office) [phone] (fax)

## 2014-04-29 ENCOUNTER — Other Ambulatory Visit: Payer: Self-pay | Admitting: Family Medicine

## 2014-05-01 NOTE — Telephone Encounter (Signed)
Tramadol last filled 04/10/14, last seen 04/24/14. Rx will print

## 2014-05-04 ENCOUNTER — Encounter: Payer: Self-pay | Admitting: Cardiology

## 2014-05-09 ENCOUNTER — Encounter: Payer: Self-pay | Admitting: Internal Medicine

## 2014-05-11 ENCOUNTER — Other Ambulatory Visit: Payer: Commercial Managed Care - HMO

## 2014-05-12 ENCOUNTER — Ambulatory Visit: Payer: Commercial Managed Care - HMO | Admitting: Cardiology

## 2014-05-16 ENCOUNTER — Telehealth: Payer: Self-pay | Admitting: Family Medicine

## 2014-05-17 ENCOUNTER — Ambulatory Visit (INDEPENDENT_AMBULATORY_CARE_PROVIDER_SITE_OTHER): Payer: Commercial Managed Care - HMO

## 2014-05-17 ENCOUNTER — Other Ambulatory Visit: Payer: Self-pay

## 2014-05-17 DIAGNOSIS — I482 Chronic atrial fibrillation: Secondary | ICD-10-CM | POA: Diagnosis not present

## 2014-05-17 DIAGNOSIS — I442 Atrioventricular block, complete: Secondary | ICD-10-CM | POA: Diagnosis not present

## 2014-05-17 DIAGNOSIS — I959 Hypotension, unspecified: Secondary | ICD-10-CM

## 2014-05-18 ENCOUNTER — Encounter: Payer: Self-pay | Admitting: Cardiology

## 2014-05-27 ENCOUNTER — Encounter (INDEPENDENT_AMBULATORY_CARE_PROVIDER_SITE_OTHER): Payer: Commercial Managed Care - HMO | Admitting: Family Medicine

## 2014-05-27 DIAGNOSIS — L89312 Pressure ulcer of right buttock, stage 2: Secondary | ICD-10-CM

## 2014-05-27 DIAGNOSIS — L97221 Non-pressure chronic ulcer of left calf limited to breakdown of skin: Secondary | ICD-10-CM

## 2014-05-27 DIAGNOSIS — I87313 Chronic venous hypertension (idiopathic) with ulcer of bilateral lower extremity: Secondary | ICD-10-CM

## 2014-05-27 DIAGNOSIS — L97211 Non-pressure chronic ulcer of right calf limited to breakdown of skin: Secondary | ICD-10-CM

## 2014-05-29 ENCOUNTER — Ambulatory Visit (INDEPENDENT_AMBULATORY_CARE_PROVIDER_SITE_OTHER): Payer: Commercial Managed Care - HMO | Admitting: Cardiology

## 2014-05-29 ENCOUNTER — Encounter: Payer: Self-pay | Admitting: Cardiology

## 2014-05-29 VITALS — BP 136/70 | HR 60 | Ht 59.0 in | Wt 137.8 lb

## 2014-05-29 DIAGNOSIS — I959 Hypotension, unspecified: Secondary | ICD-10-CM

## 2014-05-29 DIAGNOSIS — I442 Atrioventricular block, complete: Secondary | ICD-10-CM

## 2014-05-29 NOTE — Progress Notes (Signed)
Clinical Summary [name] is a 81 y.o.female seen today for follow up of the following medical problems. She has been followed by Dr Rayann Heman for her history of heart block, this is our first visit together.   1. Heart block - pacemaker followed by Dr Rayann Heman - normal device function 03/2014.   2. Hypotension - seen in [facility] ER 04/19/14 with low bp. From notes asymptomatic low bp reporteldy 79/40 at wound clinic and patient sent to ER. In ER bp was within normal limitsat 103/54, heart rate 65. Hgb at that time 8, Cr 1.42 (baseline 0.9-1.0), BUN 30.  - echo LVEF 55-60%, abnormal diastolic function, mild to mod AI, mild AS, mod MR, mod to severe TR, PASP 57, normal RV function. Normal IVC - notes rare dizziness with position changes - drinks muscle milk x2, occas soda, hot tea daily, no energy drinks, no EtoH. Limited water. She has been on lasix at home.     Past Medical History  Diagnosis Date  . Permanent atrial fibrillation   . Complete heart block     s/p PPM by Dr Rayann Heman  . Diastolic dysfunction   . Venous insufficiency     with chronic leg ulcers  . Obesity   . Pulmonary hypertension   . Chronic lung disease     on home O2   . Debilitated   . Hypertension   . Sleep apnea   . Rheumatoid arthritis(714.0)   . Stroke     remote     No Known Allergies   Current Outpatient Prescriptions  Medication Sig Dispense Refill  . acetaminophen (TYLENOL EX ST ARTHRITIS PAIN) 500 MG tablet Take 500 mg by mouth every 6 (six) hours as needed for mild pain or moderate pain.    Marland Kitchen apixaban (ELIQUIS) 5 MG TABS tablet Take 5 mg by mouth 2 (two) times daily.    . clindamycin (CLEOCIN) 300 MG capsule Take 1 capsule (300 mg total) by mouth 3 (three) times daily. 30 capsule 0  . ferrous sulfate 325 (65 FE) MG EC tablet Take 1 tablet (325 mg total) by mouth 3 (three) times daily with meals. 30 tablet 3  . furosemide (LASIX) 20 MG tablet Take 1 tablet (20 mg total) by mouth every  morning. 90 tablet 4  . levothyroxine (SYNTHROID, LEVOTHROID) 50 MCG tablet Take 1 tablet (50 mcg total) by mouth daily. 90 tablet 1  . OXYGEN-HELIUM IN Inhale into the lungs continuous.    . simvastatin (ZOCOR) 20 MG tablet TAKE ONE TABLET BY MOUTH ONE TIME DAILY 30 tablet 3  . traMADol (ULTRAM) 50 MG tablet TAKE 1/2 TO 1 TABLET BY MOUTH TWICE A DAY AS NEEDED 30 tablet 0   No current facility-administered medications for this visit.     Past Surgical History  Procedure Laterality Date  . Pacemaker insertion  08/13/10    SJM by Dr Rayann Heman     No Known Allergies    No family history on file.   Social History [name] reports that she has never smoked. She has never used smokeless tobacco. [name] reports that she does not drink alcohol.   Review of Systems CONSTITUTIONAL: No weight loss, fever, chills, weakness or fatigue.  HEENT: Eyes: No visual loss, blurred vision, double vision or yellow sclerae.No hearing loss, sneezing, congestion, runny nose or sore throat.  SKIN: No rash or itching.  CARDIOVASCULAR: per HPI RESPIRATORY: No shortness of breath, cough or sputum.  GASTROINTESTINAL: No anorexia, nausea,  vomiting or diarrhea. No abdominal pain or blood.  GENITOURINARY: No burning on urination, no polyuria NEUROLOGICAL: No headache, dizziness, syncope, paralysis, ataxia, numbness or tingling in the extremities. No change in bowel or bladder control.  MUSCULOSKELETAL: No muscle, back pain, joint pain or stiffness.  LYMPHATICS: No enlarged nodes. No history of splenectomy.  PSYCHIATRIC: No history of depression or anxiety.  ENDOCRINOLOGIC: No reports of sweating, cold or heat intolerance. No polyuria or polydipsia.  Marland Kitchen   Physical Examination p 60 bp 136/70 Wt 137 lbs BMI 28 Gen: resting comfortably, no acute distress HEENT: no scleral icterus, pupils equal round and reactive, no palptable cervical adenopathy,  CV: RRR, no m/r/g, no JVD Resp: Clear to  auscultation bilaterally GI: abdomen is soft, non-tender, non-distended, normal bowel sounds, no hepatosplenomegaly MSK: extremities are warm, no edema.  Skin: warm, no rash Neuro:  no focal deficits Psych: appropriate affect   Diagnostic Studies  04/2014 Echo Study Conclusions  - Left ventricle: The cavity size was normal. Wall thickness was normal. Systolic function was normal. The estimated ejection fraction was in the range of 55% to 60%. Abnormal diastolic function, indeterminate grade. Wall motion was normal; there were no regional wall motion abnormalities. - Aortic valve: Mildly calcified annulus. Trileaflet; mildly thickened leaflets. There was mild stenosis. There was mild to moderate regurgitation. Mean gradient (S): 13 mm Hg. Valve area (VTI): 1.56 cm^2. Valve area (Vmax): 1.45 cm^2. Valve area (Vmean): 1.34 cm^2. Regurgitation pressure half-time: 542 ms. - Mitral valve: Mildly to moderately calcified annulus. Mildly thickened leaflets . There was moderate regurgitation. The MR VC is 0.5 cm. - Left atrium: The atrium was severely dilated. - Right ventricle: The cavity size was mildly dilated. - Right atrium: The atrium was severely dilated. - Tricuspid valve: There are 2 seperate TR jets. TR VC for jet 1 is 4.0 cm, the TR VC for jet 2 is 0.3 cm. The composite of the jets is moderate to severe TR. - Pulmonary arteries: Systolic pressure was moderately increased. PA peak pressure: 57 mm Hg (S). - Inferior vena cava: The vessel was normal in size. The respirophasic diameter changes were in the normal range (>= 50%), consistent with normal central venous pressure. - Technically adequate study.   Assessment and Plan   1. Hypotension - episode in 03/2014, evaluated at [facility] ER and discharged. Based on Cr and BUN she appears to have been hypovolemic. By history endorses poor oral hydration, had also been on lasix - encouraged increased  oral intake, use lasix prn only - normal blood pressure today and other recent medical visits. Continue to follow with better hydration  2. Heart block - pacemaker management per Dr Rayann Heman   Colleen Lenis, M.D.

## 2014-05-29 NOTE — Patient Instructions (Signed)

## 2014-05-30 ENCOUNTER — Telehealth: Payer: Self-pay | Admitting: Family Medicine

## 2014-06-01 ENCOUNTER — Encounter: Payer: Self-pay | Admitting: Family Medicine

## 2014-06-01 ENCOUNTER — Ambulatory Visit (INDEPENDENT_AMBULATORY_CARE_PROVIDER_SITE_OTHER): Payer: Commercial Managed Care - HMO | Admitting: Family Medicine

## 2014-06-01 VITALS — BP 98/45 | HR 60 | Temp 98.6°F | Ht 59.0 in | Wt 138.6 lb

## 2014-06-01 DIAGNOSIS — E038 Other specified hypothyroidism: Secondary | ICD-10-CM

## 2014-06-01 DIAGNOSIS — I8311 Varicose veins of right lower extremity with inflammation: Secondary | ICD-10-CM

## 2014-06-01 DIAGNOSIS — I8312 Varicose veins of left lower extremity with inflammation: Secondary | ICD-10-CM | POA: Diagnosis not present

## 2014-06-01 DIAGNOSIS — E034 Atrophy of thyroid (acquired): Secondary | ICD-10-CM | POA: Diagnosis not present

## 2014-06-01 DIAGNOSIS — I10 Essential (primary) hypertension: Secondary | ICD-10-CM

## 2014-06-01 DIAGNOSIS — I872 Venous insufficiency (chronic) (peripheral): Secondary | ICD-10-CM | POA: Diagnosis not present

## 2014-06-01 LAB — POCT CBC
Granulocyte percent: 77.5 %G (ref 37–80)
HEMATOCRIT: 30.7 % — AB (ref 37.7–47.9)
Hemoglobin: 9.2 g/dL — AB (ref 12.2–16.2)
Lymph, poc: 2.2 (ref 0.6–3.4)
MCH: 25.8 pg — AB (ref 27–31.2)
MCHC: 30.1 g/dL — AB (ref 31.8–35.4)
MCV: 85.5 fL (ref 80–97)
MPV: 6.2 fL (ref 0–99.8)
PLATELET COUNT, POC: 412 10*3/uL (ref 142–424)
POC GRANULOCYTE: 10 — AB (ref 2–6.9)
POC LYMPH %: 16.7 % (ref 10–50)
RBC: 3.58 M/uL — AB (ref 4.04–5.48)
RDW, POC: 17 %
WBC: 12.9 10*3/uL — AB (ref 4.6–10.2)

## 2014-06-01 MED ORDER — TRAMADOL HCL 50 MG PO TABS: 50.0000 mg | ORAL_TABLET | Freq: Three times a day (TID) | ORAL | Status: DC | PRN

## 2014-06-01 MED ORDER — CIPROFLOXACIN HCL 250 MG PO TABS: 250.0000 mg | ORAL_TABLET | Freq: Two times a day (BID) | ORAL | Status: DC

## 2014-06-01 NOTE — Progress Notes (Signed)
Subjective:  Patient ID: [name], female    DOB: [birth year]  Age: 81 y.o. MRN: [id number]  CC: Anemia; malnutrition; chronic respiratory failure; and lower leg wounds   HPI [name] presents for off BP meds.Possibly still on losartan. Seeing wound care specialist for her legs. They're currently wrapped. They have been swollen and erythematous infected and she has run out of her antibiotics. Home health has been wrapping them for her under direction of wound care. Today we are 2 evaluate her anemia. She recently was hospitalized and found to be significantly anemic. There is concerned that proper healing cannot occur without better oxygen transport. She has had a better respiratory status recently with a pulse ox of 95 on room air at the cardiologist's office. This was just a few days ago. She is using oxygen primarily at nighttime. Her blood pressure has been dropping into the 90s and she was taken off of her blood pressure medicines. However, the daughter with her says that she has unknowingly was giving her the losartan because it was being put in the pill dispenser. She has not been having any chest pain. She is currently not short of breath. She has been trying to increase her protein by drinking supplements and eating more 8 each meal. This is secondary to evaluation showing malnutrition during recent hospitalization.  She recently was evaluated for hypothyroidism and her dosage of medication increased. She is due to have that level rechecked today.Patient presents for follow-up on  thyroid. Pt. denies any change in  voice, loss of hair, heat or cold intolerance. Energy level has been adequate to good. She denies constipation and diarrhea. No myxedema. Medication is as noted below. Verified that pt is taking it daily on an empty stomach. Well tolerated.  History [name] has a past medical history of Permanent atrial fibrillation; Complete heart block; Diastolic dysfunction; Venous  insufficiency; Obesity; Pulmonary hypertension; Chronic lung disease; Debilitated; Hypertension; Sleep apnea; Rheumatoid arthritis(714.0); and Stroke.   She has past surgical history that includes Pacemaker insertion (08/13/10).   Her family history is not on file.She reports that she has never smoked. She has never used smokeless tobacco. She reports that she does not drink alcohol or use illicit drugs.  Current Outpatient Prescriptions on File Prior to Visit  Medication Sig Dispense Refill  . apixaban (ELIQUIS) 5 MG TABS tablet Take 5 mg by mouth 2 (two) times daily.    Marland Kitchen levothyroxine (SYNTHROID, LEVOTHROID) 50 MCG tablet Take 1 tablet (50 mcg total) by mouth daily. 90 tablet 1  . OXYGEN-HELIUM IN Inhale into the lungs continuous.    . simvastatin (ZOCOR) 20 MG tablet TAKE ONE TABLET BY MOUTH ONE TIME DAILY 30 tablet 3  . acetaminophen (TYLENOL EX ST ARTHRITIS PAIN) 500 MG tablet Take 500 mg by mouth every 6 (six) hours as needed for mild pain or moderate pain.    . ferrous sulfate 325 (65 FE) MG EC tablet Take 1 tablet (325 mg total) by mouth 3 (three) times daily with meals. (Patient not taking: Reported on 06/01/2014) 30 tablet 3   No current facility-administered medications on file prior to visit.    ROS Review of Systems  Constitutional: Negative for fever, chills, diaphoresis, appetite change, fatigue and unexpected weight change.  HENT: Negative for congestion, ear pain, hearing loss, postnasal drip, rhinorrhea, sneezing, sore throat and trouble swallowing.   Eyes: Negative for pain.  Respiratory: Negative for cough, chest tightness and shortness of breath.   Cardiovascular:  Negative for chest pain and palpitations.  Gastrointestinal: Negative for nausea, vomiting, abdominal pain, diarrhea and constipation.  Genitourinary: Negative for dysuria, frequency and menstrual problem.  Musculoskeletal: Positive for joint swelling and arthralgias.  Skin: Positive for color change and wound  (stasis dermatitis and cellulitis see history of present illness).  Neurological: Negative for dizziness, weakness, numbness and headaches.  Psychiatric/Behavioral: Negative for dysphoric mood and agitation.    Objective:  BP 98/45 mmHg  Pulse 60  Temp(Src) 98.6 F (37 C) (Oral)  Ht 4' 11"  (1.499 m)  Wt 138 lb 9.6 oz (62.869 kg)  BMI 27.98 kg/m2  BP Readings from Last 3 Encounters:  06/01/14 98/45  05/29/14 136/70  04/28/14 100/58    Wt Readings from Last 3 Encounters:  06/01/14 138 lb 9.6 oz (62.869 kg)  05/29/14 137 lb 12.8 oz (62.506 kg)  04/28/14 138 lb (62.596 kg)     Physical Exam  Constitutional: She is oriented to person, place, and time. She appears well-developed and well-nourished. No distress.  HENT:  Head: Normocephalic and atraumatic.  Right Ear: External ear normal.  Left Ear: External ear normal.  Nose: Nose normal.  Mouth/Throat: Oropharynx is clear and moist.  Eyes: Conjunctivae and EOM are normal. Pupils are equal, round, and reactive to light.  Neck: Normal range of motion. Neck supple. No thyromegaly present.  Cardiovascular: Normal rate, regular rhythm and normal heart sounds.   No murmur heard. Pulmonary/Chest: Effort normal and breath sounds normal. No respiratory distress. She has no wheezes. She has no rales.  Abdominal: Soft. Bowel sounds are normal. She exhibits no distension. There is no tenderness.  Musculoskeletal: She exhibits edema (both lower extremities) and tenderness (both lower extremities).  Wheelchair-bound  Lymphadenopathy:    She has no cervical adenopathy.  Neurological: She is alert and oriented to person, place, and time. She has normal reflexes.  Skin: Skin is warm and dry.  Wounds dressed and since they are under the care of a specialist the complex dressings were not removed today  Psychiatric: She has a normal mood and affect.    No results found for: HGBA1C  Lab Results  Component Value Date   WBC 12.9*  06/01/2014   HGB 9.2* 06/01/2014   HCT 30.7* 06/01/2014   PLT 204 05/06/2013   GLUCOSE 97 03/24/2014   CHOL 234* 06/28/2013   TRIG 94 06/28/2013   HDL 61 06/28/2013   LDLCALC 154* 06/28/2013   ALT 14 06/28/2013   AST 20 06/28/2013   NA 142 03/24/2014   K 4.5 03/24/2014   CL 98 03/24/2014   CREATININE 1.40* 03/24/2014   BUN 21 03/24/2014   CO2 27 03/24/2014   TSH 5.440* 03/24/2014   INR 1.26 08/12/2010    No results found.  Assessment & Plan:   [name] was seen today for anemia, malnutrition, chronic respiratory failure and lower leg wounds.  Diagnoses and all orders for this visit:  Hypothyroidism due to acquired atrophy of thyroid Orders: -     POCT CBC -     CMP14+EGFR -     TSH -     T4, Free  Venous insufficiency Orders: -     POCT CBC  Stasis dermatitis of both legs Orders: -     POCT CBC  Essential hypertension Orders: -     POCT CBC -     CMP14+EGFR  Other orders -     traMADol (ULTRAM) 50 MG tablet; Take 1 tablet (50 mg total) by mouth 3 (  three) times daily as needed for moderate pain. -     ciprofloxacin (CIPRO) 250 MG tablet; Take 1 tablet (250 mg total) by mouth 2 (two) times daily.   I have discontinued Ms. Langhans's clindamycin. I have also changed her traMADol. Additionally, I am having her start on ciprofloxacin. Lastly, I am having her maintain her acetaminophen, OXYGEN-HELIUM IN, levothyroxine, apixaban, ferrous sulfate, simvastatin, furosemide, and multivitamin with minerals.  Meds ordered this encounter  Medications  . furosemide (LASIX) 40 MG tablet    Sig: Take 1 tablet by mouth daily.  . Multiple Vitamins-Minerals (MULTIVITAMIN WITH MINERALS) tablet    Sig: Take 1 tablet by mouth daily.  . traMADol (ULTRAM) 50 MG tablet    Sig: Take 1 tablet (50 mg total) by mouth 3 (three) times daily as needed for moderate pain.    Dispense:  60 tablet    Refill:  3  . ciprofloxacin (CIPRO) 250 MG tablet    Sig: Take 1 tablet (250 mg total) by  mouth 2 (two) times daily.    Dispense:  20 tablet    Refill:  0     Follow-up: Return in about 6 weeks (around 07/13/2014).  [name], M.D.

## 2014-06-02 LAB — CMP14+EGFR
ALK PHOS: 66 IU/L (ref 39–117)
ALT: 8 IU/L (ref 0–32)
AST: 11 IU/L (ref 0–40)
Albumin/Globulin Ratio: 1.2 (ref 1.1–2.5)
Albumin: 3.3 g/dL — ABNORMAL LOW (ref 3.5–4.8)
BUN/Creatinine Ratio: 21 (ref 11–26)
BUN: 20 mg/dL (ref 8–27)
Bilirubin Total: 0.3 mg/dL (ref 0.0–1.2)
CALCIUM: 8.7 mg/dL (ref 8.7–10.3)
CHLORIDE: 97 mmol/L (ref 97–108)
CO2: 27 mmol/L (ref 18–29)
CREATININE: 0.94 mg/dL (ref 0.57–1.00)
GFR calc Af Amer: 67 mL/min/{1.73_m2} (ref >59)
GFR calc non Af Amer: 58 mL/min/{1.73_m2} — ABNORMAL LOW (ref >59)
Globulin, Total: 2.8 g/dL (ref 1.5–4.5)
Glucose: 97 mg/dL (ref 65–99)
POTASSIUM: 4.3 mmol/L (ref 3.5–5.2)
SODIUM: 139 mmol/L (ref 134–144)
Total Protein: 6.1 g/dL (ref 6.0–8.5)

## 2014-06-02 LAB — T4, FREE: FREE T4: 1.11 ng/dL (ref 0.82–1.77)

## 2014-06-02 LAB — TSH: TSH: 3.8 u[IU]/mL (ref 0.450–4.500)

## 2014-06-22 ENCOUNTER — Ambulatory Visit (INDEPENDENT_AMBULATORY_CARE_PROVIDER_SITE_OTHER): Payer: Commercial Managed Care - HMO | Admitting: Family Medicine

## 2014-06-22 ENCOUNTER — Encounter: Payer: Self-pay | Admitting: Family Medicine

## 2014-06-22 VITALS — BP 122/68 | HR 59 | Temp 98.3°F | Ht 59.0 in | Wt 136.4 lb

## 2014-06-22 DIAGNOSIS — I8312 Varicose veins of left lower extremity with inflammation: Secondary | ICD-10-CM | POA: Diagnosis not present

## 2014-06-22 DIAGNOSIS — I872 Venous insufficiency (chronic) (peripheral): Secondary | ICD-10-CM | POA: Diagnosis not present

## 2014-06-22 DIAGNOSIS — I8311 Varicose veins of right lower extremity with inflammation: Secondary | ICD-10-CM

## 2014-06-22 MED ORDER — CIPROFLOXACIN HCL 250 MG PO TABS: 250.0000 mg | ORAL_TABLET | Freq: Two times a day (BID) | ORAL | Status: DC

## 2014-06-22 NOTE — Progress Notes (Addendum)
Subjective:  Patient ID: [name], female    DOB: 1933-10-28  Age: 81 y.o. MRN: [id number]  CC: Hypertension; Venous Insufficiency; and malnutrition   HPI [name] presents for  follow-up of hypertension. Patient has no history of headache chest pain or shortness of breath or recent cough. Patient also denies symptoms of TIA such as numbness weakness lateralizing. Patient checks  blood pressure at home and has not had any elevated readings recently. Patient denies side effects from his medication. States taking it regularly.   Was at wound center last wek. Both legs debrided   History [name] has a past medical history of Permanent atrial fibrillation; Complete heart block; Diastolic dysfunction; Venous insufficiency; Obesity; Pulmonary hypertension; Chronic lung disease; Debilitated; Hypertension; Sleep apnea; Rheumatoid arthritis(714.0); and Stroke.   She has past surgical history that includes Pacemaker insertion (08/13/10).   Her family history is not on file.She reports that she has never smoked. She has never used smokeless tobacco. She reports that she does not drink alcohol or use illicit drugs.  Current Outpatient Prescriptions on File Prior to Visit  Medication Sig Dispense Refill  . apixaban (ELIQUIS) 5 MG TABS tablet Take 5 mg by mouth 2 (two) times daily.    . ferrous sulfate 325 (65 FE) MG EC tablet Take 1 tablet (325 mg total) by mouth 3 (three) times daily with meals. 30 tablet 3  . furosemide (LASIX) 40 MG tablet Take 1 tablet by mouth daily.    Marland Kitchen levothyroxine (SYNTHROID, LEVOTHROID) 50 MCG tablet Take 1 tablet (50 mcg total) by mouth daily. 90 tablet 1  . Multiple Vitamins-Minerals (MULTIVITAMIN WITH MINERALS) tablet Take 1 tablet by mouth daily.    Donell Sievert IN Inhale into the lungs continuous.    . simvastatin (ZOCOR) 20 MG tablet TAKE ONE TABLET BY MOUTH ONE TIME DAILY 30 tablet 3  . traMADol (ULTRAM) 50 MG tablet Take 1 tablet (50 mg total) by  mouth 3 (three) times daily as needed for moderate pain. 60 tablet 3   No current facility-administered medications on file prior to visit.    ROS Review of Systems  Constitutional: Negative for fever, chills, diaphoresis, appetite change, fatigue and unexpected weight change.  HENT: Negative for congestion, ear pain, hearing loss, postnasal drip, rhinorrhea, sneezing, sore throat and trouble swallowing.   Eyes: Negative for pain.  Respiratory: Negative for cough, chest tightness and shortness of breath.   Cardiovascular: Negative for chest pain and palpitations.  Gastrointestinal: Negative for nausea, vomiting, abdominal pain, diarrhea and constipation.  Genitourinary: Negative for dysuria, frequency and menstrual problem.  Skin: Positive for wound.       See history of present illness  Neurological: Negative for dizziness, weakness, numbness and headaches.  Psychiatric/Behavioral: Negative for dysphoric mood and agitation.    Objective:  BP 122/68 mmHg  Pulse 59  Temp(Src) 98.3 F (36.8 C) (Oral)  Ht 4\' 11"  (1.499 m)  Wt 136 lb 6.4 oz (61.871 kg)  BMI 27.53 kg/m2  BP Readings from Last 3 Encounters:  06/22/14 122/68  06/01/14 98/45  05/29/14 136/70    Wt Readings from Last 3 Encounters:  06/22/14 136 lb 6.4 oz (61.871 kg)  06/01/14 138 lb 9.6 oz (62.869 kg)  05/29/14 137 lb 12.8 oz (62.506 kg)     Physical Exam  Constitutional: She is oriented to person, place, and time. She appears well-developed and well-nourished. No distress.  HENT:  Head: Normocephalic and atraumatic.  Right Ear: External ear normal.  Left Ear: External ear normal.  Nose: Nose normal.  Mouth/Throat: Oropharynx is clear and moist.  Eyes: Conjunctivae and EOM are normal. Pupils are equal, round, and reactive to light.  Neck: Normal range of motion. Neck supple. No thyromegaly present.  Cardiovascular: Normal rate, regular rhythm and normal heart sounds.   No murmur heard. Pulmonary/Chest:  Effort normal and breath sounds normal. No respiratory distress. She has no wheezes. She has no rales.  Abdominal: Soft. Bowel sounds are normal. She exhibits no distension. There is no tenderness.  Musculoskeletal: She exhibits edema (3+  edema of both lower extremities with marked erythema Granulation bed forming in area of previous debridement over mid calf and shin) and tenderness.  Lymphadenopathy:    She has no cervical adenopathy.  Neurological: She is alert and oriented to person, place, and time. She has normal reflexes.  Skin: Skin is warm and dry.  Psychiatric: She has a normal mood and affect. Her behavior is normal. Judgment and thought content normal.    No results found for: HGBA1C  Lab Results  Component Value Date   WBC 12.9* 06/01/2014   HGB 9.2* 06/01/2014   HCT 30.7* 06/01/2014   PLT 204 05/06/2013   GLUCOSE 97 06/01/2014   CHOL 234* 06/28/2013   TRIG 94 06/28/2013   HDL 61 06/28/2013   LDLCALC 154* 06/28/2013   ALT 8 06/01/2014   AST 11 06/01/2014   NA 139 06/01/2014   K 4.3 06/01/2014   CL 97 06/01/2014   CREATININE 0.94 06/01/2014   BUN 20 06/01/2014   CO2 27 06/01/2014   TSH 3.800 06/01/2014   INR 1.26 08/12/2010    No results found.  Assessment & Plan:   [name] was seen today for hypertension, venous insufficiency and malnutrition.  Diagnoses and all orders for this visit:  Stasis dermatitis of both legs  Venous insufficiency  Other orders -     ciprofloxacin (CIPRO) 250 MG tablet; Take 1 tablet (250 mg total) by mouth 2 (two) times daily.  S/P debridement with beginning of granulation bed & healing noted I have discontinued [name]'s acetaminophen. I am also having her maintain her OXYGEN-HELIUM IN, levothyroxine, apixaban, ferrous sulfate, simvastatin, furosemide, multivitamin with minerals, traMADol, and ciprofloxacin.  Meds ordered this encounter  Medications  . ciprofloxacin (CIPRO) 250 MG tablet    Sig: Take 1 tablet (250 mg  total) by mouth 2 (two) times daily.    Dispense:  20 tablet    Refill:  0   Vaseline guaze dressing applied until wound care nurse can reapply formal dressing a pt.s home.  Follow-up: Return in about 1 month (around 07/23/2014).  [name], M.D.

## 2014-06-22 NOTE — Addendum Note (Signed)
Addended by: [name] on: 06/22/2014 06:28 PM   Modules accepted: Orders, SmartSet

## 2014-06-30 ENCOUNTER — Other Ambulatory Visit: Payer: Self-pay | Admitting: Family Medicine

## 2014-07-17 ENCOUNTER — Ambulatory Visit (INDEPENDENT_AMBULATORY_CARE_PROVIDER_SITE_OTHER): Payer: Commercial Managed Care - HMO | Admitting: *Deleted

## 2014-07-17 DIAGNOSIS — I442 Atrioventricular block, complete: Secondary | ICD-10-CM | POA: Diagnosis not present

## 2014-07-17 NOTE — Progress Notes (Signed)
Remote pacemaker transmission.   

## 2014-07-18 LAB — CUP PACEART REMOTE DEVICE CHECK
Battery Remaining Longevity: 137 mo
Brady Statistic RV Percent Paced: 84 %
Date Time Interrogation Session: 20160620060009
Lead Channel Impedance Value: 580 Ohm
Lead Channel Pacing Threshold Amplitude: 0.75 V
Lead Channel Sensing Intrinsic Amplitude: 6.1 mV
Lead Channel Setting Pacing Pulse Width: 0.4 ms
MDC IDC MSMT BATTERY REMAINING PERCENTAGE: 95.5 %
MDC IDC MSMT BATTERY VOLTAGE: 2.99 V
MDC IDC MSMT LEADCHNL RV PACING THRESHOLD PULSEWIDTH: 0.4 ms
MDC IDC SET LEADCHNL RV PACING AMPLITUDE: 2.5 V
MDC IDC SET LEADCHNL RV SENSING SENSITIVITY: 2.5 mV
Pulse Gen Model: 1210
Pulse Gen Serial Number: 7245638

## 2014-07-20 ENCOUNTER — Encounter: Payer: Self-pay | Admitting: Cardiology

## 2014-07-24 ENCOUNTER — Ambulatory Visit (INDEPENDENT_AMBULATORY_CARE_PROVIDER_SITE_OTHER): Payer: Commercial Managed Care - HMO | Admitting: Family Medicine

## 2014-07-24 ENCOUNTER — Encounter: Payer: Self-pay | Admitting: Family Medicine

## 2014-07-24 VITALS — BP 125/61 | HR 66 | Temp 97.0°F | Ht 59.0 in | Wt 133.0 lb

## 2014-07-24 DIAGNOSIS — I8311 Varicose veins of right lower extremity with inflammation: Secondary | ICD-10-CM | POA: Diagnosis not present

## 2014-07-24 DIAGNOSIS — I4821 Permanent atrial fibrillation: Secondary | ICD-10-CM

## 2014-07-24 DIAGNOSIS — I482 Chronic atrial fibrillation: Secondary | ICD-10-CM | POA: Diagnosis not present

## 2014-07-24 DIAGNOSIS — I872 Venous insufficiency (chronic) (peripheral): Secondary | ICD-10-CM | POA: Diagnosis not present

## 2014-07-24 DIAGNOSIS — I8312 Varicose veins of left lower extremity with inflammation: Secondary | ICD-10-CM

## 2014-07-24 DIAGNOSIS — G4730 Sleep apnea, unspecified: Secondary | ICD-10-CM | POA: Insufficient documentation

## 2014-07-24 DIAGNOSIS — L97902 Non-pressure chronic ulcer of unspecified part of unspecified lower leg with fat layer exposed: Secondary | ICD-10-CM | POA: Diagnosis not present

## 2014-07-24 DIAGNOSIS — I420 Dilated cardiomyopathy: Secondary | ICD-10-CM | POA: Insufficient documentation

## 2014-07-24 DIAGNOSIS — I10 Essential (primary) hypertension: Secondary | ICD-10-CM | POA: Diagnosis not present

## 2014-07-24 NOTE — Progress Notes (Signed)
Subjective:  Patient ID: [name], female    DOB: 1933/06/30  Age: 81 y.o. MRN: [id number]  CC: Hypertension; Venous Insufficiency; and malnutrition   HPI DOREATHA OFFER presents for patient is doing much better according to the daughter. She had the woundsewrapped this morning by home health nurse and they are seeing the wound doctor in 2 days. At the last visit he told them that there was new skin forming and the infection had resolved. She has finished up the Cipro and is no longer taking that.   Patient in for follow-up of atrial fibrillation. Patient denies any recent bouts of chest pain or palpitations. Additionally, patient is taking anticoagulants. Patient denies any recent excessive bleeding episodes including epistaxis, bleeding from the gums, genitalia, rectal bleeding or hematuria. Additionally there has been no excessive bruising.  History [name] has a past medical history of Permanent atrial fibrillation; Complete heart block; Diastolic dysfunction; Venous insufficiency; Obesity; Pulmonary hypertension; Chronic lung disease; Debilitated; Hypertension; Sleep apnea; Rheumatoid arthritis(714.0); and Stroke.   She has past surgical history that includes Pacemaker insertion (08/13/10).   Her family history is not on file.She reports that she has never smoked. She has never used smokeless tobacco. She reports that she does not drink alcohol or use illicit drugs.  Outpatient Prescriptions Prior to Visit  Medication Sig Dispense Refill  . apixaban (ELIQUIS) 5 MG TABS tablet Take 5 mg by mouth 2 (two) times daily.    . ferrous sulfate 325 (65 FE) MG EC tablet Take 1 tablet (325 mg total) by mouth 3 (three) times daily with meals. 30 tablet 3  . furosemide (LASIX) 20 MG tablet TAKE 1 TABLET (20 MG TOTAL) BY MOUTH EVERY MORNING. 90 tablet 1  . furosemide (LASIX) 40 MG tablet Take 1 tablet by mouth daily.    Marland Kitchen levothyroxine (SYNTHROID, LEVOTHROID) 50 MCG tablet Take 1 tablet (50  mcg total) by mouth daily. 90 tablet 1  . Multiple Vitamins-Minerals (MULTIVITAMIN WITH MINERALS) tablet Take 1 tablet by mouth daily.    Donell Sievert IN Inhale into the lungs continuous.    . simvastatin (ZOCOR) 20 MG tablet TAKE ONE TABLET BY MOUTH ONE TIME DAILY 30 tablet 3  . traMADol (ULTRAM) 50 MG tablet Take 1 tablet (50 mg total) by mouth 3 (three) times daily as needed for moderate pain. 60 tablet 3  . ciprofloxacin (CIPRO) 250 MG tablet Take 1 tablet (250 mg total) by mouth 2 (two) times daily. (Patient not taking: Reported on 07/24/2014) 20 tablet 0   No facility-administered medications prior to visit.    ROS Review of Systems  Constitutional: Negative for appetite change and fatigue.  HENT: Negative for congestion, ear pain and rhinorrhea.   Eyes: Negative.   Respiratory: Negative for cough, chest tightness and shortness of breath.   Cardiovascular: Negative for chest pain and palpitations.  Gastrointestinal: Negative for nausea, vomiting, abdominal pain and constipation.  Genitourinary: Negative for dysuria and frequency.  Musculoskeletal: Negative for joint swelling and arthralgias.  Skin: Negative for rash.  Neurological: Positive for weakness. Negative for numbness.  Psychiatric/Behavioral: Negative.     Objective:  BP 125/61 mmHg  Pulse 66  Temp(Src) 97 F (36.1 C) (Oral)  Ht 4\' 11"  (1.499 m)  Wt 133 lb (60.328 kg)  BMI 26.85 kg/m2  BP Readings from Last 3 Encounters:  07/24/14 125/61  06/22/14 122/68  06/01/14 98/45    Wt Readings from Last 3 Encounters:  07/24/14 133 lb (60.328 kg)  06/22/14  136 lb 6.4 oz (61.871 kg)  06/01/14 138 lb 9.6 oz (62.869 kg)     Physical Exam  Constitutional: She is oriented to person, place, and time. She appears well-developed and well-nourished. No distress.  HENT:  Head: Normocephalic and atraumatic.  Right Ear: External ear normal.  Left Ear: External ear normal.  Nose: Nose normal.  Mouth/Throat: Oropharynx  is clear and moist.  Eyes: Conjunctivae and EOM are normal. Pupils are equal, round, and reactive to light.  Neck: Normal range of motion. Neck supple. No thyromegaly present.  Cardiovascular: Normal rate, regular rhythm and normal heart sounds.   No murmur heard. Pulmonary/Chest: Effort normal and breath sounds normal. No respiratory distress. She has no wheezes. She has no rales.  Abdominal: Soft. Bowel sounds are normal. She exhibits no distension. There is no tenderness.  Lymphadenopathy:    She has no cervical adenopathy.  Neurological: She is alert and oriented to person, place, and time. She has normal reflexes.  Skin: Skin is warm and dry.  Psychiatric: She has a normal mood and affect. Her behavior is normal. Judgment and thought content normal.    No results found for: HGBA1C  Lab Results  Component Value Date   WBC 12.9* 06/01/2014   HGB 9.2* 06/01/2014   HCT 30.7* 06/01/2014   PLT 204 05/06/2013   GLUCOSE 97 06/01/2014   CHOL 234* 06/28/2013   TRIG 94 06/28/2013   HDL 61 06/28/2013   LDLCALC 154* 06/28/2013   ALT 8 06/01/2014   AST 11 06/01/2014   NA 139 06/01/2014   K 4.3 06/01/2014   CL 97 06/01/2014   CREATININE 0.94 06/01/2014   BUN 20 06/01/2014   CO2 27 06/01/2014   TSH 3.800 06/01/2014   INR 1.26 08/12/2010    No results found.  Assessment & Plan:   Kenitra was seen today for hypertension, venous insufficiency and malnutrition.  Diagnoses and all orders for this visit:  Stasis dermatitis of both legs  Venous insufficiency  Essential hypertension  Permanent atrial fibrillation  Chronic ulcer of leg, unspecified laterality, with fat layer exposed  I have discontinued Ms. [name]'s ciprofloxacin. I am also having her maintain her OXYGEN-HELIUM IN, levothyroxine, apixaban, ferrous sulfate, simvastatin, furosemide, multivitamin with minerals, traMADol, and furosemide.  No orders of the defined types were placed in this encounter.      Follow-up: Return in about 3 months (around 10/24/2014).  [name], M.D.

## 2014-07-26 ENCOUNTER — Encounter: Payer: Self-pay | Admitting: Internal Medicine

## 2014-08-03 ENCOUNTER — Encounter: Payer: Self-pay | Admitting: Cardiology

## 2014-08-16 ENCOUNTER — Encounter: Payer: Self-pay | Admitting: *Deleted

## 2014-09-03 ENCOUNTER — Other Ambulatory Visit: Payer: Self-pay | Admitting: Family Medicine

## 2014-09-04 NOTE — Telephone Encounter (Signed)
Last seen 07/24/14 Dr Livia Snellen  Last lipid 06/28/13

## 2014-10-01 ENCOUNTER — Other Ambulatory Visit: Payer: Self-pay | Admitting: Family Medicine

## 2014-10-09 ENCOUNTER — Encounter: Payer: Self-pay | Admitting: Family Medicine

## 2014-10-09 NOTE — Telephone Encounter (Signed)
This is critical information about the patient's health. Please thank the daughter for taking the time to write those notes. Her know that I will certainly act on those when she comes in. He may want to see if there is a time for her to come sooner than September 27. I would be happy to see her sooner.

## 2014-10-11 ENCOUNTER — Other Ambulatory Visit: Payer: Self-pay

## 2014-10-11 ENCOUNTER — Encounter: Payer: Self-pay | Admitting: Family Medicine

## 2014-10-11 ENCOUNTER — Ambulatory Visit (INDEPENDENT_AMBULATORY_CARE_PROVIDER_SITE_OTHER): Payer: Commercial Managed Care - HMO | Admitting: Family Medicine

## 2014-10-11 VITALS — BP 134/55 | HR 60 | Temp 97.7°F | Wt 133.4 lb

## 2014-10-11 DIAGNOSIS — D508 Other iron deficiency anemias: Secondary | ICD-10-CM | POA: Diagnosis not present

## 2014-10-11 DIAGNOSIS — I482 Chronic atrial fibrillation: Secondary | ICD-10-CM | POA: Diagnosis not present

## 2014-10-11 DIAGNOSIS — G309 Alzheimer's disease, unspecified: Principal | ICD-10-CM

## 2014-10-11 DIAGNOSIS — E038 Other specified hypothyroidism: Secondary | ICD-10-CM | POA: Diagnosis not present

## 2014-10-11 DIAGNOSIS — G308 Other Alzheimer's disease: Secondary | ICD-10-CM | POA: Diagnosis not present

## 2014-10-11 DIAGNOSIS — F02818 Dementia in other diseases classified elsewhere, unspecified severity, with other behavioral disturbance: Secondary | ICD-10-CM

## 2014-10-11 DIAGNOSIS — Z7901 Long term (current) use of anticoagulants: Secondary | ICD-10-CM | POA: Diagnosis not present

## 2014-10-11 DIAGNOSIS — E875 Hyperkalemia: Secondary | ICD-10-CM

## 2014-10-11 DIAGNOSIS — I10 Essential (primary) hypertension: Secondary | ICD-10-CM | POA: Diagnosis not present

## 2014-10-11 DIAGNOSIS — I4821 Permanent atrial fibrillation: Secondary | ICD-10-CM

## 2014-10-11 DIAGNOSIS — F0280 Dementia in other diseases classified elsewhere without behavioral disturbance: Secondary | ICD-10-CM | POA: Insufficient documentation

## 2014-10-11 DIAGNOSIS — F0281 Dementia in other diseases classified elsewhere with behavioral disturbance: Secondary | ICD-10-CM

## 2014-10-11 MED ORDER — APIXABAN 5 MG PO TABS: 5.0000 mg | ORAL_TABLET | Freq: Two times a day (BID) | ORAL | Status: DC

## 2014-10-11 MED ORDER — DONEPEZIL HCL 5 MG PO TABS: 5.0000 mg | ORAL_TABLET | Freq: Every day | ORAL | Status: DC

## 2014-10-11 NOTE — Progress Notes (Signed)
Subjective:  Patient ID: [name], female    DOB: 1933-09-08  Age: 81 y.o. MRN: [id number]  CC: Atrial Fibrillation; Hypertension; and Venous Insufficiency   HPI [name] presents for  Patient in for follow-up of atrial fibrillation. Patient denies any recent bouts of chest pain or palpitations. Additionally, patient is taking anticoagulant. Patient denies any recent excessive bleeding episodes including epistaxis, bleeding from the gums, genitalia, rectal bleeding or hematuria. Additionally there has been no excessive bruising.   follow-up of hypertension. Patient has no history of headache chest pain or shortness of breath or recent cough. Patient also denies symptoms of TIA such as numbness weakness lateralizing. Patient checks  blood pressure at home and has not had any elevated readings recently. Patient denies side effects from his medication. States taking it regularly.   Multiple email messages through My Chart were received from the daughter  2 days agooutlining multiple episodes of demented behavior including forgetfulness, paranoia, hostility as well as poor judgment and poor cognition. Those are appended. Additionally the daughter cautioned against formal MMSE as the patient has made it very clear that she would not tolerate anybody questioning her mental competence. She feared pt. Would act out if mentation was challenged. Daughter states that 2 of the four siblings caring for the patient were unable to continue due to the behavior documented in the correspondence.  History [name] has a past medical history of Permanent atrial fibrillation; Complete heart block; Diastolic dysfunction; Venous insufficiency; Obesity; Pulmonary hypertension; Chronic lung disease; Debilitated; Hypertension; Sleep apnea; Rheumatoid arthritis(714.0); and Stroke.   She has past surgical history that includes Pacemaker insertion (08/13/10).   Her family history is not on file.She reports  that she has never smoked. She has never used smokeless tobacco. She reports that she does not drink alcohol or use illicit drugs.  Outpatient Prescriptions Prior to Visit  Medication Sig Dispense Refill  . apixaban (ELIQUIS) 5 MG TABS tablet Take 1 tablet (5 mg total) by mouth 2 (two) times daily. 60 tablet 2  . ferrous sulfate 325 (65 FE) MG EC tablet Take 1 tablet (325 mg total) by mouth 3 (three) times daily with meals. 30 tablet 3  . furosemide (LASIX) 20 MG tablet TAKE 1 TABLET (20 MG TOTAL) BY MOUTH EVERY MORNING. 90 tablet 1  . levothyroxine (SYNTHROID, LEVOTHROID) 50 MCG tablet TAKE ONE TABLET BY MOUTH ONE TIME DAILY 90 tablet 2  . Multiple Vitamins-Minerals (MULTIVITAMIN WITH MINERALS) tablet Take 1 tablet by mouth daily.    Donell Sievert IN Inhale into the lungs continuous.    . simvastatin (ZOCOR) 20 MG tablet TAKE ONE TABLET BY MOUTH ONE TIME DAILY 30 tablet 0  . traMADol (ULTRAM) 50 MG tablet Take 1 tablet (50 mg total) by mouth 3 (three) times daily as needed for moderate pain. 60 tablet 3  . furosemide (LASIX) 40 MG tablet Take 1 tablet by mouth daily.     No facility-administered medications prior to visit.    ROS Review of Systems  Constitutional: Negative for fever, chills, diaphoresis, appetite change, fatigue and unexpected weight change.  HENT: Negative for congestion, ear pain, hearing loss, postnasal drip, rhinorrhea, sneezing, sore throat and trouble swallowing.   Eyes: Negative for pain.  Respiratory: Negative for cough, chest tightness and shortness of breath.   Cardiovascular: Negative for chest pain and palpitations.  Gastrointestinal: Negative for nausea, vomiting, abdominal pain, diarrhea and constipation.  Genitourinary: Negative for dysuria, frequency and menstrual problem.  Musculoskeletal: Negative for  joint swelling and arthralgias.  Skin: Negative for rash.  Neurological: Negative for dizziness, weakness, numbness and headaches.    Psychiatric/Behavioral: Negative for dysphoric mood and agitation.    Objective:  BP 134/55 mmHg  Pulse 60  Temp(Src) 97.7 F (36.5 C) (Oral)  Wt 133 lb 6.4 oz (60.51 kg)  BP Readings from Last 3 Encounters:  10/11/14 134/55  07/24/14 125/61  06/22/14 122/68    Wt Readings from Last 3 Encounters:  10/11/14 133 lb 6.4 oz (60.51 kg)  07/24/14 133 lb (60.328 kg)  06/22/14 136 lb 6.4 oz (61.871 kg)     Physical Exam  Constitutional: She is oriented to person, place, and time. She appears well-developed and well-nourished. No distress.  HENT:  Head: Normocephalic and atraumatic.  Right Ear: External ear normal.  Left Ear: External ear normal.  Nose: Nose normal.  Mouth/Throat: Oropharynx is clear and moist.  Eyes: Conjunctivae and EOM are normal. Pupils are equal, round, and reactive to light.  Neck: Normal range of motion. Neck supple. No thyromegaly present.  Cardiovascular: Normal rate, regular rhythm and normal heart sounds.   No murmur heard. Pulmonary/Chest: Effort normal and breath sounds normal. No respiratory distress. She has no wheezes. She has no rales.  Abdominal: Soft. Bowel sounds are normal. She exhibits no distension. There is no tenderness.  Musculoskeletal: Normal range of motion. She exhibits no edema.  Lymphadenopathy:    She has no cervical adenopathy.  Neurological: She is alert and oriented to person, place, and time. She has normal reflexes.  Skin: Skin is warm and dry.  Dressings with gauze in place covering the lower leg bilaterally. As these are under the care of specialty service dressings were not removed.  Psychiatric: She has a normal mood and affect.  Extensive conversation revealed rather subtle memory deficits and cognitive deficits. She is quite concrete in her thinking when discussing conceptual ideas. Conversation is at a childlike level with regard to comprehension and response. Patient is alert. She is grossly oriented to person and  place questionably to time. She was initially cooperative but became somewhat defensive at suggestion of any type of memory loss stating," everybody is forgetful some time."    No results found for: HGBA1C  Lab Results  Component Value Date   WBC 12.9* 06/01/2014   HGB 9.2* 06/01/2014   HCT 30.7* 06/01/2014   PLT 204 05/06/2013   GLUCOSE 97 06/01/2014   CHOL 234* 06/28/2013   TRIG 94 06/28/2013   HDL 61 06/28/2013   LDLCALC 154* 06/28/2013   ALT 8 06/01/2014   AST 11 06/01/2014   NA 139 06/01/2014   K 4.3 06/01/2014   CL 97 06/01/2014   CREATININE 0.94 06/01/2014   BUN 20 06/01/2014   CO2 27 06/01/2014   TSH 3.800 06/01/2014   INR 1.26 08/12/2010    No results found.  Assessment & Plan:   Jamari was seen today for atrial fibrillation, hypertension and venous insufficiency.  Diagnoses and all orders for this visit:  Dementia of Alzheimer's type with behavioral disturbance  Permanent atrial fibrillation -     CBC with Differential/Platelet -     CMP14+EGFR -     Lipid panel  Other iron deficiency anemias -     CBC with Differential/Platelet -     CMP14+EGFR -     Lipid panel  Other specified hypothyroidism -     CBC with Differential/Platelet -     CMP14+EGFR -     Lipid panel  Long term current use of anticoagulant  Essential hypertension  Other orders -     donepezil (ARICEPT) 5 MG tablet; Take 1 tablet (5 mg total) by mouth at bedtime.   I am having Ms. Boling start on donepezil. I am also having her maintain her OXYGEN-HELIUM IN, ferrous sulfate, furosemide, multivitamin with minerals, traMADol, furosemide, levothyroxine, simvastatin, apixaban, and losartan.  Meds ordered this encounter  Medications  . losartan (COZAAR) 100 MG tablet    Sig: Take 100 mg by mouth daily.   Marland Kitchen donepezil (ARICEPT) 5 MG tablet    Sig: Take 1 tablet (5 mg total) by mouth at bedtime.    Dispense:  30 tablet    Refill:  1   45 minute spent with patient most of which  was in discussion with the patient trying to elicit details of dementia and confirm the emails/my chart notes that had been sent by the daughter and reviewed by me. Finally after much discussion she was willing to try donepezil in order to preserve her memory to prevent dementia.  Follow-up: Return in about 1 month (around 11/10/2014).  [name], M.D.

## 2014-10-12 LAB — CBC WITH DIFFERENTIAL/PLATELET
Basophils Absolute: 0 10*3/uL (ref 0.0–0.2)
Basos: 0 %
EOS (ABSOLUTE): 0.1 10*3/uL (ref 0.0–0.4)
Eos: 1 %
HEMOGLOBIN: 11.8 g/dL (ref 11.1–15.9)
Hematocrit: 36.5 % (ref 34.0–46.6)
Immature Grans (Abs): 0 10*3/uL (ref 0.0–0.1)
Immature Granulocytes: 0 %
LYMPHS ABS: 2 10*3/uL (ref 0.7–3.1)
Lymphs: 20 %
MCH: 26.9 pg (ref 26.6–33.0)
MCHC: 32.3 g/dL (ref 31.5–35.7)
MCV: 83 fL (ref 79–97)
MONOS ABS: 0.8 10*3/uL (ref 0.1–0.9)
Monocytes: 8 %
NEUTROS ABS: 7.1 10*3/uL — AB (ref 1.4–7.0)
Neutrophils: 71 %
Platelets: 304 10*3/uL (ref 150–379)
RBC: 4.38 x10E6/uL (ref 3.77–5.28)
RDW: 16.4 % — AB (ref 12.3–15.4)
WBC: 10 10*3/uL (ref 3.4–10.8)

## 2014-10-12 LAB — CMP14+EGFR
ALBUMIN: 4 g/dL (ref 3.5–4.8)
ALK PHOS: 96 IU/L (ref 39–117)
ALT: 9 IU/L (ref 0–32)
AST: 20 IU/L (ref 0–40)
Albumin/Globulin Ratio: 1.1 (ref 1.1–2.5)
BILIRUBIN TOTAL: 0.3 mg/dL (ref 0.0–1.2)
BUN / CREAT RATIO: 27 — AB (ref 11–26)
BUN: 28 mg/dL — ABNORMAL HIGH (ref 8–27)
CHLORIDE: 102 mmol/L (ref 97–108)
CO2: 23 mmol/L (ref 18–29)
CREATININE: 1.02 mg/dL — AB (ref 0.57–1.00)
Calcium: 9.5 mg/dL (ref 8.7–10.3)
GFR calc Af Amer: 60 mL/min/{1.73_m2} (ref >59)
GFR calc non Af Amer: 52 mL/min/{1.73_m2} — ABNORMAL LOW (ref >59)
Globulin, Total: 3.5 g/dL (ref 1.5–4.5)
Glucose: 118 mg/dL — ABNORMAL HIGH (ref 65–99)
Potassium: 5.7 mmol/L — ABNORMAL HIGH (ref 3.5–5.2)
SODIUM: 146 mmol/L — AB (ref 134–144)
Total Protein: 7.5 g/dL (ref 6.0–8.5)

## 2014-10-12 LAB — LIPID PANEL
CHOLESTEROL TOTAL: 203 mg/dL — AB (ref 100–199)
Chol/HDL Ratio: 2.9 ratio units (ref 0.0–4.4)
HDL: 70 mg/dL (ref >39)
LDL CALC: 114 mg/dL — AB (ref 0–99)
TRIGLYCERIDES: 93 mg/dL (ref 0–149)
VLDL CHOLESTEROL CAL: 19 mg/dL (ref 5–40)

## 2014-10-13 NOTE — Addendum Note (Signed)
Addended by: Jamelle Haring on: 10/13/2014 02:49 PM   Modules accepted: Orders, SmartSet

## 2014-10-16 ENCOUNTER — Ambulatory Visit (INDEPENDENT_AMBULATORY_CARE_PROVIDER_SITE_OTHER): Payer: Commercial Managed Care - HMO | Admitting: *Deleted

## 2014-10-16 DIAGNOSIS — I442 Atrioventricular block, complete: Secondary | ICD-10-CM

## 2014-10-16 NOTE — Progress Notes (Signed)
Remote pacemaker transmission.   

## 2014-10-17 ENCOUNTER — Other Ambulatory Visit (INDEPENDENT_AMBULATORY_CARE_PROVIDER_SITE_OTHER): Payer: Commercial Managed Care - HMO

## 2014-10-17 DIAGNOSIS — E875 Hyperkalemia: Secondary | ICD-10-CM

## 2014-10-17 NOTE — Progress Notes (Signed)
Lab only 

## 2014-10-18 LAB — CMP14+EGFR
ALT: 8 IU/L (ref 0–32)
AST: 18 IU/L (ref 0–40)
Albumin/Globulin Ratio: 1.1 (ref 1.1–2.5)
Albumin: 3.7 g/dL (ref 3.5–4.8)
Alkaline Phosphatase: 86 IU/L (ref 39–117)
BUN/Creatinine Ratio: 19 (ref 11–26)
BUN: 19 mg/dL (ref 8–27)
Bilirubin Total: 0.3 mg/dL (ref 0.0–1.2)
CALCIUM: 9.2 mg/dL (ref 8.7–10.3)
CO2: 23 mmol/L (ref 18–29)
CREATININE: 1.01 mg/dL — AB (ref 0.57–1.00)
Chloride: 99 mmol/L (ref 97–108)
GFR, EST AFRICAN AMERICAN: 61 mL/min/{1.73_m2} (ref >59)
GFR, EST NON AFRICAN AMERICAN: 53 mL/min/{1.73_m2} — AB (ref >59)
GLOBULIN, TOTAL: 3.3 g/dL (ref 1.5–4.5)
Glucose: 108 mg/dL — ABNORMAL HIGH (ref 65–99)
Potassium: 4.3 mmol/L (ref 3.5–5.2)
Sodium: 143 mmol/L (ref 134–144)
TOTAL PROTEIN: 7 g/dL (ref 6.0–8.5)

## 2014-10-23 LAB — CUP PACEART REMOTE DEVICE CHECK
Battery Remaining Longevity: 137 mo
Brady Statistic RV Percent Paced: 87 %
Date Time Interrogation Session: 20160919061246
Lead Channel Impedance Value: 610 Ohm
Lead Channel Pacing Threshold Amplitude: 0.75 V
Lead Channel Sensing Intrinsic Amplitude: 5.9 mV
Lead Channel Setting Pacing Amplitude: 2.5 V
MDC IDC MSMT BATTERY REMAINING PERCENTAGE: 95.5 %
MDC IDC MSMT BATTERY VOLTAGE: 2.98 V
MDC IDC MSMT LEADCHNL RV PACING THRESHOLD PULSEWIDTH: 0.4 ms
MDC IDC SET LEADCHNL RV PACING PULSEWIDTH: 0.4 ms
MDC IDC SET LEADCHNL RV SENSING SENSITIVITY: 2.5 mV
Pulse Gen Serial Number: 7245638

## 2014-10-24 ENCOUNTER — Ambulatory Visit: Payer: Commercial Managed Care - HMO | Admitting: Family Medicine

## 2014-10-27 ENCOUNTER — Encounter: Payer: Self-pay | Admitting: Cardiology

## 2014-10-31 ENCOUNTER — Other Ambulatory Visit: Payer: Self-pay | Admitting: Family Medicine

## 2014-11-08 ENCOUNTER — Encounter: Payer: Self-pay | Admitting: Internal Medicine

## 2014-11-10 ENCOUNTER — Ambulatory Visit (INDEPENDENT_AMBULATORY_CARE_PROVIDER_SITE_OTHER): Payer: Commercial Managed Care - HMO | Admitting: Family Medicine

## 2014-11-10 ENCOUNTER — Encounter: Payer: Self-pay | Admitting: Family Medicine

## 2014-11-10 VITALS — BP 133/58 | HR 59 | Temp 98.7°F | Ht 59.0 in | Wt 138.8 lb

## 2014-11-10 DIAGNOSIS — I83009 Varicose veins of unspecified lower extremity with ulcer of unspecified site: Secondary | ICD-10-CM | POA: Diagnosis not present

## 2014-11-10 DIAGNOSIS — G309 Alzheimer's disease, unspecified: Principal | ICD-10-CM

## 2014-11-10 DIAGNOSIS — I872 Venous insufficiency (chronic) (peripheral): Secondary | ICD-10-CM | POA: Diagnosis not present

## 2014-11-10 DIAGNOSIS — Z23 Encounter for immunization: Secondary | ICD-10-CM | POA: Diagnosis not present

## 2014-11-10 DIAGNOSIS — G308 Other Alzheimer's disease: Secondary | ICD-10-CM | POA: Diagnosis not present

## 2014-11-10 DIAGNOSIS — L97909 Non-pressure chronic ulcer of unspecified part of unspecified lower leg with unspecified severity: Secondary | ICD-10-CM

## 2014-11-10 DIAGNOSIS — F0281 Dementia in other diseases classified elsewhere with behavioral disturbance: Secondary | ICD-10-CM

## 2014-11-10 MED ORDER — DONEPEZIL HCL 10 MG PO TABS: 10.0000 mg | ORAL_TABLET | Freq: Every day | ORAL | Status: DC

## 2014-11-10 NOTE — Progress Notes (Signed)
Subjective:  Patient ID: [name], female    DOB: [birth year]  Age: 81 y.o. MRN: [id number]  CC: Venous Insufficiency; Dementia; and Fall   HPI [name] presents for recheck of alzheimers & venous stasis ulcers. She still has been hostile and forgetful. Also confused at times. However she is tolerating the medication well. She will not let home health change the dressings. She is agreeable to coming here to have them changed by office staff. The stasis ulcers are under treatment with Unna boots weekly per vascular and home health.  History [name] has a past medical history of Permanent atrial fibrillation ([place]); Complete heart block ([place]); Diastolic dysfunction; Venous insufficiency; Obesity; Pulmonary hypertension ([place]); Chronic lung disease; Debilitated; Hypertension; Sleep apnea; Rheumatoid arthritis(714.0); and Stroke ([place]).   She has past surgical history that includes Pacemaker insertion (08/13/10).   Her family history is not on file.She reports that she has never smoked. She has never used smokeless tobacco. She reports that she does not drink alcohol or use illicit drugs.  Outpatient Prescriptions Prior to Visit  Medication Sig Dispense Refill  . apixaban (ELIQUIS) 5 MG TABS tablet Take 1 tablet (5 mg total) by mouth 2 (two) times daily. 60 tablet 2  . ferrous sulfate 325 (65 FE) MG EC tablet Take 1 tablet (325 mg total) by mouth 3 (three) times daily with meals. 30 tablet 3  . furosemide (LASIX) 20 MG tablet TAKE 1 TABLET (20 MG TOTAL) BY MOUTH EVERY MORNING. 90 tablet 1  . furosemide (LASIX) 40 MG tablet Take 1 tablet by mouth daily.    Marland Kitchen levothyroxine (SYNTHROID, LEVOTHROID) 50 MCG tablet TAKE ONE TABLET BY MOUTH ONE TIME DAILY 90 tablet 2  . losartan (COZAAR) 100 MG tablet Take 100 mg by mouth daily.     . Multiple Vitamins-Minerals (MULTIVITAMIN WITH MINERALS) tablet Take 1 tablet by mouth daily.    Donell Sievert IN Inhale into the lungs continuous.    .  simvastatin (ZOCOR) 20 MG tablet TAKE ONE TABLET BY MOUTH ONE  TIME DAILY 30 tablet 4  . traMADol (ULTRAM) 50 MG tablet Take 1 tablet (50 mg total) by mouth 3 (three) times daily as needed for moderate pain. 60 tablet 3  . donepezil (ARICEPT) 5 MG tablet Take 1 tablet (5 mg total) by mouth at bedtime. 30 tablet 1   No facility-administered medications prior to visit.    ROS Review of Systems  Constitutional: Positive for fatigue. Negative for appetite change.  HENT: Negative for congestion, hearing loss, postnasal drip, rhinorrhea, sore throat and trouble swallowing.   Eyes: Negative for pain.  Respiratory: Negative for cough and chest tightness.   Cardiovascular: Negative for chest pain and palpitations.  Gastrointestinal: Negative for abdominal pain, diarrhea and constipation.  Genitourinary: Negative for dysuria and frequency.  Skin: Positive for rash (Stasis lesions noted after removal of the Unna boots. Fairly extensive stage II ulceration with fresh blood. These are present on both lower extremities at the shins and calves.).  Neurological: Negative for dizziness, weakness, numbness and headaches.  Psychiatric/Behavioral: Negative for dysphoric mood and agitation.    Objective:  BP 133/58 mmHg  Pulse 59  Temp(Src) 98.7 F (37.1 C) (Oral)  Ht 4\' 11"  (1.499 m)  Wt 138 lb 12.8 oz (62.959 kg)  BMI 28.02 kg/m2  BP Readings from Last 3 Encounters:  11/10/14 133/58  10/11/14 134/55  07/24/14 125/61    Wt Readings from Last 3 Encounters:  11/10/14 138 lb 12.8 oz (  62.959 kg)  10/11/14 133 lb 6.4 oz (60.51 kg)  07/24/14 133 lb (60.328 kg)     Physical Exam  Cardiovascular:  Murmur (3/6 holosystolic) heard.   No results found for: HGBA1C  Lab Results  Component Value Date   WBC 10.0 10/11/2014   HGB 9.2* 06/01/2014   HCT 36.5 10/11/2014   PLT 204 05/06/2013   GLUCOSE 108* 10/17/2014   CHOL 203* 10/11/2014   TRIG 93 10/11/2014   HDL 70 10/11/2014   LDLCALC 114*  10/11/2014   ALT 8 10/17/2014   AST 18 10/17/2014   NA 143 10/17/2014   K 4.3 10/17/2014   CL 99 10/17/2014   CREATININE 1.01* 10/17/2014   BUN 19 10/17/2014   CO2 23 10/17/2014   TSH 3.800 06/01/2014   INR 1.26 08/12/2010    No results found.  Assessment & Plan:   Aloria was seen today for venous insufficiency, dementia and fall.  Diagnoses and all orders for this visit:  Dementia of Alzheimer's type with behavioral disturbance  Venous insufficiency  Stasis ulcer of lower extremity, unspecified laterality ([place])  Encounter for immunization  Other orders -     donepezil (ARICEPT) 10 MG tablet; Take 1 tablet (10 mg total) by mouth at bedtime. -     Flu Vaccine QUAD 36+ mos IM   I have changed Ms. Ent's donepezil. I am also having her maintain her OXYGEN-HELIUM IN, ferrous sulfate, furosemide, multivitamin with minerals, traMADol, furosemide, levothyroxine, apixaban, losartan, and simvastatin.  Meds ordered this encounter  Medications  . donepezil (ARICEPT) 10 MG tablet    Sig: Take 1 tablet (10 mg total) by mouth at bedtime.    Dispense:  30 tablet    Refill:  2     Follow-up: Return in about 1 month (around 12/11/2014).  [name], M.D.

## 2014-11-13 ENCOUNTER — Encounter: Payer: Self-pay | Admitting: Cardiology

## 2014-11-23 ENCOUNTER — Ambulatory Visit: Payer: Self-pay | Admitting: *Deleted

## 2014-11-23 DIAGNOSIS — I4821 Permanent atrial fibrillation: Secondary | ICD-10-CM

## 2014-12-07 ENCOUNTER — Telehealth: Payer: Self-pay | Admitting: Family Medicine

## 2014-12-07 NOTE — Telephone Encounter (Signed)
Called home health nurse back, she wanted new orders for wound care, pt is still going to wound clinic at [facility], told her she would have to get orders from them

## 2014-12-11 ENCOUNTER — Ambulatory Visit (INDEPENDENT_AMBULATORY_CARE_PROVIDER_SITE_OTHER): Payer: Commercial Managed Care - HMO | Admitting: Cardiology

## 2014-12-11 ENCOUNTER — Encounter: Payer: Self-pay | Admitting: Cardiology

## 2014-12-11 VITALS — BP 146/76 | HR 60 | Ht 59.0 in | Wt 136.0 lb

## 2014-12-11 DIAGNOSIS — I959 Hypotension, unspecified: Secondary | ICD-10-CM | POA: Diagnosis not present

## 2014-12-11 DIAGNOSIS — I442 Atrioventricular block, complete: Secondary | ICD-10-CM | POA: Diagnosis not present

## 2014-12-11 DIAGNOSIS — I4891 Unspecified atrial fibrillation: Secondary | ICD-10-CM

## 2014-12-11 NOTE — Progress Notes (Signed)
Patient ID: [name], female   DOB: 10-05-1933, 81 y.o.   MRN: [id number]     Clinical Summary [name] is a 81 y.o.female seen today for follow up of the following medical problems.   1. Heart block - pacemaker followed by Dr Rayann Heman - normal device function 09/2014.   2. Hypotension - seen in [facility] ER 04/19/14 with low bp. From notes asymptomatic low bp reporteldy 79/40 at wound clinic and patient sent to ER. In ER bp was within normal limitsat 103/54, heart rate 65. Hgb at that time 8, Cr 1.42 (baseline 0.9-1.0), BUN 30.  - echo LVEF 55-60%, abnormal diastolic function, mild to mod AI, mild AS, mod MR, mod to severe TR, PASP 57, normal RV function. Normal IVC  - denies any recent issues with low blood pressure. Denies any lightheadedness or dizziness.   3. PAF - no recent palpitations.  - denies any bleeding on eliquis   Past Medical History  Diagnosis Date  . Permanent atrial fibrillation ([place])   . Complete heart block ([facility])     s/p PPM by Dr Rayann Heman  . Diastolic dysfunction   . Venous insufficiency     with chronic leg ulcers  . Obesity   . Pulmonary hypertension ([place])   . Chronic lung disease     on home O2   . Debilitated   . Hypertension   . Sleep apnea   . Rheumatoid arthritis(714.0)   . Stroke [facility])     remote     No Known Allergies   Current Outpatient Prescriptions  Medication Sig Dispense Refill  . apixaban (ELIQUIS) 5 MG TABS tablet Take 1 tablet (5 mg total) by mouth 2 (two) times daily. 60 tablet 2  . donepezil (ARICEPT) 10 MG tablet Take 1 tablet (10 mg total) by mouth at bedtime. 30 tablet 2  . ferrous sulfate 325 (65 FE) MG EC tablet Take 1 tablet (325 mg total) by mouth 3 (three) times daily with meals. 30 tablet 3  . furosemide (LASIX) 20 MG tablet TAKE 1 TABLET (20 MG TOTAL) BY MOUTH EVERY MORNING. 90 tablet 1  . furosemide (LASIX) 40 MG tablet Take 1 tablet by mouth daily.    Marland Kitchen levothyroxine (SYNTHROID, LEVOTHROID) 50 MCG tablet  TAKE ONE TABLET BY MOUTH ONE TIME DAILY 90 tablet 2  . losartan (COZAAR) 100 MG tablet Take 100 mg by mouth daily.     . Multiple Vitamins-Minerals (MULTIVITAMIN WITH MINERALS) tablet Take 1 tablet by mouth daily.    Donell Sievert IN Inhale into the lungs continuous.    . simvastatin (ZOCOR) 20 MG tablet TAKE ONE TABLET BY MOUTH ONE  TIME DAILY 30 tablet 4  . traMADol (ULTRAM) 50 MG tablet Take 1 tablet (50 mg total) by mouth 3 (three) times daily as needed for moderate pain. 60 tablet 3   No current facility-administered medications for this visit.     Past Surgical History  Procedure Laterality Date  . Pacemaker insertion  08/13/10    SJM by Dr Rayann Heman     No Known Allergies    No family history on file.   Social History [name] reports that she has never smoked. She has never used smokeless tobacco. [name] reports that she does not drink alcohol.   Review of Systems CONSTITUTIONAL: No weight loss, fever, chills, weakness or fatigue.  HEENT: Eyes: No visual loss, blurred vision, double vision or yellow sclerae.No hearing loss, sneezing, congestion, runny nose or sore throat.  SKIN: No rash or itching.  CARDIOVASCULAR: per HPI RESPIRATORY: No shortness of breath, cough or sputum.  GASTROINTESTINAL: No anorexia, nausea, vomiting or diarrhea. No abdominal pain or blood.  GENITOURINARY: No burning on urination, no polyuria NEUROLOGICAL: No headache, dizziness, syncope, paralysis, ataxia, numbness or tingling in the extremities. No change in bowel or bladder control.  MUSCULOSKELETAL: No muscle, back pain, joint pain or stiffness.  LYMPHATICS: No enlarged nodes. No history of splenectomy.  PSYCHIATRIC: No history of depression or anxiety.  ENDOCRINOLOGIC: No reports of sweating, cold or heat intolerance. No polyuria or polydipsia.  Marland Kitchen   Physical Examination Filed Vitals:   12/11/14 1138  BP: 146/76  Pulse: 60   Filed Vitals:   12/11/14 1138  Height: 4'  11" (1.499 m)  Weight: 136 lb (61.689 kg)    Gen: resting comfortably, no acute distress HEENT: no scleral icterus, pupils equal round and reactive, no palptable cervical adenopathy,  CV: RRR, 3/6 systolic murmur at apex Resp: Clear to auscultation bilaterally GI: abdomen is soft, non-tender, non-distended, normal bowel sounds, no hepatosplenomegaly MSK: extremities are warm, no edema.  Skin: warm, no rash Neuro:  no focal deficits Psych: appropriate affect   Diagnostic Studies  04/2014 Echo Study Conclusions  - Left ventricle: The cavity size was normal. Wall thickness was normal. Systolic function was normal. The estimated ejection fraction was in the range of 55% to 60%. Abnormal diastolic function, indeterminate grade. Wall motion was normal; there were no regional wall motion abnormalities. - Aortic valve: Mildly calcified annulus. Trileaflet; mildly thickened leaflets. There was mild stenosis. There was mild to moderate regurgitation. Mean gradient (S): 13 mm Hg. Valve area (VTI): 1.56 cm^2. Valve area (Vmax): 1.45 cm^2. Valve area (Vmean): 1.34 cm^2. Regurgitation pressure half-time: 542 ms. - Mitral valve: Mildly to moderately calcified annulus. Mildly thickened leaflets . There was moderate regurgitation. The MR VC is 0.5 cm. - Left atrium: The atrium was severely dilated. - Right ventricle: The cavity size was mildly dilated. - Right atrium: The atrium was severely dilated. - Tricuspid valve: There are 2 seperate TR jets. TR VC for jet 1 is 4.0 cm, the TR VC for jet 2 is 0.3 cm. The composite of the jets is moderate to severe TR. - Pulmonary arteries: Systolic pressure was moderately increased. PA peak pressure: 57 mm Hg (S). - Inferior vena cava: The vessel was normal in size. The respirophasic diameter changes were in the normal range (>= 50%), consistent with normal central venous pressure. - Technically adequate  study.    Assessment and Plan  1. Hypotension - occurred in the setting of hypovolemia. No recurrent issues, continue to follow  2. Heart block - pacemaker management per Dr Rayann Heman  3. Valvular heart disease - moderate MR and TR, continue to follow clincally  F/u 1 year  Arnoldo Lenis, M.D.

## 2014-12-11 NOTE — Patient Instructions (Signed)

## 2015-01-09 ENCOUNTER — Other Ambulatory Visit: Payer: Self-pay | Admitting: *Deleted

## 2015-01-09 MED ORDER — LOSARTAN POTASSIUM 100 MG PO TABS: 100.0000 mg | ORAL_TABLET | Freq: Every day | ORAL | Status: DC

## 2015-01-11 ENCOUNTER — Other Ambulatory Visit: Payer: Self-pay | Admitting: Family Medicine

## 2015-01-15 ENCOUNTER — Ambulatory Visit (INDEPENDENT_AMBULATORY_CARE_PROVIDER_SITE_OTHER): Payer: Commercial Managed Care - HMO | Admitting: *Deleted

## 2015-01-15 DIAGNOSIS — I442 Atrioventricular block, complete: Secondary | ICD-10-CM

## 2015-01-16 NOTE — Progress Notes (Signed)
Remote pacemaker transmission.   

## 2015-01-18 LAB — CUP PACEART REMOTE DEVICE CHECK
Battery Voltage: 2.99 V
Brady Statistic RV Percent Paced: 89 %
Date Time Interrogation Session: 20161219084151
Implantable Lead Model: 1948
Lead Channel Setting Pacing Pulse Width: 0.4 ms
Lead Channel Setting Sensing Sensitivity: 2.5 mV
MDC IDC LEAD IMPLANT DT: 20120717
MDC IDC LEAD LOCATION: 753860
MDC IDC MSMT BATTERY REMAINING LONGEVITY: 137 mo
MDC IDC MSMT BATTERY REMAINING PERCENTAGE: 95.5 %
MDC IDC MSMT LEADCHNL RV IMPEDANCE VALUE: 630 Ohm
MDC IDC MSMT LEADCHNL RV SENSING INTR AMPL: 6.6 mV
MDC IDC PG SERIAL: 7245638
MDC IDC SET LEADCHNL RV PACING AMPLITUDE: 2.5 V

## 2015-01-19 ENCOUNTER — Encounter: Payer: Self-pay | Admitting: Cardiology

## 2015-01-27 ENCOUNTER — Encounter (INDEPENDENT_AMBULATORY_CARE_PROVIDER_SITE_OTHER): Payer: Commercial Managed Care - HMO | Admitting: Family Medicine

## 2015-01-27 DIAGNOSIS — L97812 Non-pressure chronic ulcer of other part of right lower leg with fat layer exposed: Secondary | ICD-10-CM | POA: Diagnosis not present

## 2015-01-27 DIAGNOSIS — I872 Venous insufficiency (chronic) (peripheral): Secondary | ICD-10-CM | POA: Diagnosis not present

## 2015-01-27 DIAGNOSIS — I890 Lymphedema, not elsewhere classified: Secondary | ICD-10-CM

## 2015-01-27 DIAGNOSIS — L97822 Non-pressure chronic ulcer of other part of left lower leg with fat layer exposed: Secondary | ICD-10-CM | POA: Diagnosis not present

## 2015-01-30 ENCOUNTER — Telehealth: Payer: Self-pay | Admitting: Family Medicine

## 2015-01-31 NOTE — Telephone Encounter (Signed)
Called and gave verbal order per dr Laurance Flatten

## 2015-02-02 ENCOUNTER — Encounter: Payer: Self-pay | Admitting: Cardiology

## 2015-02-02 DIAGNOSIS — Z9981 Dependence on supplemental oxygen: Secondary | ICD-10-CM | POA: Diagnosis not present

## 2015-02-02 DIAGNOSIS — J9611 Chronic respiratory failure with hypoxia: Secondary | ICD-10-CM | POA: Diagnosis not present

## 2015-02-02 DIAGNOSIS — L97812 Non-pressure chronic ulcer of other part of right lower leg with fat layer exposed: Secondary | ICD-10-CM | POA: Diagnosis not present

## 2015-02-02 DIAGNOSIS — Z950 Presence of cardiac pacemaker: Secondary | ICD-10-CM | POA: Diagnosis not present

## 2015-02-02 DIAGNOSIS — I252 Old myocardial infarction: Secondary | ICD-10-CM | POA: Diagnosis not present

## 2015-02-02 DIAGNOSIS — I872 Venous insufficiency (chronic) (peripheral): Secondary | ICD-10-CM | POA: Diagnosis not present

## 2015-02-02 DIAGNOSIS — D649 Anemia, unspecified: Secondary | ICD-10-CM | POA: Diagnosis not present

## 2015-02-02 DIAGNOSIS — L97822 Non-pressure chronic ulcer of other part of left lower leg with fat layer exposed: Secondary | ICD-10-CM | POA: Diagnosis not present

## 2015-02-02 DIAGNOSIS — I482 Chronic atrial fibrillation: Secondary | ICD-10-CM | POA: Diagnosis not present

## 2015-02-02 DIAGNOSIS — I5030 Unspecified diastolic (congestive) heart failure: Secondary | ICD-10-CM | POA: Diagnosis not present

## 2015-02-02 DIAGNOSIS — I272 Other secondary pulmonary hypertension: Secondary | ICD-10-CM | POA: Diagnosis not present

## 2015-02-02 DIAGNOSIS — E46 Unspecified protein-calorie malnutrition: Secondary | ICD-10-CM | POA: Diagnosis not present

## 2015-02-02 DIAGNOSIS — I890 Lymphedema, not elsewhere classified: Secondary | ICD-10-CM | POA: Diagnosis not present

## 2015-02-02 DIAGNOSIS — Z7901 Long term (current) use of anticoagulants: Secondary | ICD-10-CM | POA: Diagnosis not present

## 2015-02-02 DIAGNOSIS — I2510 Atherosclerotic heart disease of native coronary artery without angina pectoris: Secondary | ICD-10-CM | POA: Diagnosis not present

## 2015-02-05 DIAGNOSIS — I872 Venous insufficiency (chronic) (peripheral): Secondary | ICD-10-CM | POA: Diagnosis not present

## 2015-02-05 DIAGNOSIS — I272 Other secondary pulmonary hypertension: Secondary | ICD-10-CM | POA: Diagnosis not present

## 2015-02-05 DIAGNOSIS — Z9981 Dependence on supplemental oxygen: Secondary | ICD-10-CM | POA: Diagnosis not present

## 2015-02-05 DIAGNOSIS — L97822 Non-pressure chronic ulcer of other part of left lower leg with fat layer exposed: Secondary | ICD-10-CM | POA: Diagnosis not present

## 2015-02-05 DIAGNOSIS — L97812 Non-pressure chronic ulcer of other part of right lower leg with fat layer exposed: Secondary | ICD-10-CM | POA: Diagnosis not present

## 2015-02-05 DIAGNOSIS — I252 Old myocardial infarction: Secondary | ICD-10-CM | POA: Diagnosis not present

## 2015-02-05 DIAGNOSIS — D649 Anemia, unspecified: Secondary | ICD-10-CM | POA: Diagnosis not present

## 2015-02-08 DIAGNOSIS — I272 Other secondary pulmonary hypertension: Secondary | ICD-10-CM | POA: Diagnosis not present

## 2015-02-08 DIAGNOSIS — L97822 Non-pressure chronic ulcer of other part of left lower leg with fat layer exposed: Secondary | ICD-10-CM | POA: Diagnosis not present

## 2015-02-08 DIAGNOSIS — L97812 Non-pressure chronic ulcer of other part of right lower leg with fat layer exposed: Secondary | ICD-10-CM | POA: Diagnosis not present

## 2015-02-08 DIAGNOSIS — I252 Old myocardial infarction: Secondary | ICD-10-CM | POA: Diagnosis not present

## 2015-02-08 DIAGNOSIS — Z9981 Dependence on supplemental oxygen: Secondary | ICD-10-CM | POA: Diagnosis not present

## 2015-02-08 DIAGNOSIS — I872 Venous insufficiency (chronic) (peripheral): Secondary | ICD-10-CM | POA: Diagnosis not present

## 2015-02-08 DIAGNOSIS — D649 Anemia, unspecified: Secondary | ICD-10-CM | POA: Diagnosis not present

## 2015-02-09 ENCOUNTER — Ambulatory Visit (INDEPENDENT_AMBULATORY_CARE_PROVIDER_SITE_OTHER): Payer: PPO

## 2015-02-09 ENCOUNTER — Ambulatory Visit (INDEPENDENT_AMBULATORY_CARE_PROVIDER_SITE_OTHER): Payer: PPO | Admitting: Family Medicine

## 2015-02-09 ENCOUNTER — Encounter: Payer: Self-pay | Admitting: Family Medicine

## 2015-02-09 VITALS — BP 133/60 | HR 60 | Temp 97.9°F | Ht 59.0 in | Wt 146.8 lb

## 2015-02-09 DIAGNOSIS — Z7901 Long term (current) use of anticoagulants: Secondary | ICD-10-CM

## 2015-02-09 DIAGNOSIS — R0600 Dyspnea, unspecified: Secondary | ICD-10-CM

## 2015-02-09 DIAGNOSIS — I10 Essential (primary) hypertension: Secondary | ICD-10-CM | POA: Diagnosis not present

## 2015-02-09 DIAGNOSIS — I519 Heart disease, unspecified: Secondary | ICD-10-CM

## 2015-02-09 DIAGNOSIS — J9611 Chronic respiratory failure with hypoxia: Secondary | ICD-10-CM | POA: Diagnosis not present

## 2015-02-09 DIAGNOSIS — D508 Other iron deficiency anemias: Secondary | ICD-10-CM | POA: Diagnosis not present

## 2015-02-09 DIAGNOSIS — E039 Hypothyroidism, unspecified: Secondary | ICD-10-CM

## 2015-02-09 DIAGNOSIS — I482 Chronic atrial fibrillation: Secondary | ICD-10-CM

## 2015-02-09 DIAGNOSIS — I4821 Permanent atrial fibrillation: Secondary | ICD-10-CM

## 2015-02-09 DIAGNOSIS — I5189 Other ill-defined heart diseases: Secondary | ICD-10-CM

## 2015-02-09 NOTE — Progress Notes (Signed)
Subjective:  Patient ID: [name], female    DOB: 07-12-1933  Age: 82 y.o. MRN: [id number]  CC: Shortness of Breath   HPI [name] presents for continued dyspnea when exerting herself without oxygen. Pt.'s portable tank has broken so she presented to the office today having come from home on room air. She felt quite exhausted bby the time she reached the lobby. When brought to the exam room she was found to have pulse ox of 80. She was started on oxygen in the exam room at 2 liters [name] and pulse ox climbed to 90. She soon felt more comfortable. Daughter is in attendance and requestswe arrange for a replacement for the portable oxygen be sent to the home. There has been some dyspnea at home, but no swelling noted over baseline. Meds reviewed as below. History given by daughter.  History [name] has a past medical history of Permanent atrial fibrillation ([name]); Complete heart block ([name]); Diastolic dysfunction; Venous insufficiency; Obesity; Pulmonary hypertension ([place]); Chronic lung disease; Debilitated; Hypertension; Sleep apnea; Rheumatoid arthritis(714.0); and Stroke ([name]).   She has past surgical history that includes Pacemaker insertion (08/13/10).   Her family history is not on file.She reports that she has never smoked. She has never used smokeless tobacco. She reports that she does not drink alcohol or use illicit drugs.  Outpatient Prescriptions Prior to Visit  Medication Sig Dispense Refill  . donepezil (ARICEPT) 10 MG tablet Take 1 tablet (10 mg total) by mouth at bedtime. 30 tablet 2  . ELIQUIS 5 MG TABS tablet TAKE 1 TABLET (5 MG TOTAL) BY MOUTH 2 (TWO) TIMES DAILY. 60 tablet 1  . ferrous sulfate 325 (65 FE) MG EC tablet Take 1 tablet (325 mg total) by mouth 3 (three) times daily with meals. 30 tablet 3  . furosemide (LASIX) 40 MG tablet Take 1 tablet by mouth daily.    Marland Kitchen levothyroxine (SYNTHROID, LEVOTHROID) 50 MCG tablet TAKE ONE TABLET BY MOUTH ONE TIME DAILY 90  tablet 2  . losartan (COZAAR) 100 MG tablet Take 1 tablet (100 mg total) by mouth daily. 90 tablet 0  . Multiple Vitamins-Minerals (MULTIVITAMIN WITH MINERALS) tablet Take 1 tablet by mouth daily.    [name] IN Inhale into the lungs continuous.    . simvastatin (ZOCOR) 20 MG tablet TAKE ONE TABLET BY MOUTH ONE  TIME DAILY 30 tablet 4  . traMADol (ULTRAM) 50 MG tablet Take 1 tablet (50 mg total) by mouth 3 (three) times daily as needed for moderate pain. 60 tablet 3  . furosemide (LASIX) 20 MG tablet TAKE 1 TABLET (20 MG TOTAL) BY MOUTH EVERY MORNING. 90 tablet 1   No facility-administered medications prior to visit.    ROS Review of Systems Review of Systems - History obtained from daughter General ROS: positive for  - fatigue Psychological ROS: positive for - concentration difficulties, disorientation and memory difficulties Respiratory ROS: no cough, shortness of breath, or wheezing positive for - shortness of breath negative for - hemoptysis, orthopnea, pleuritic pain, sputum changes or wheezing Cardiovascular ROS: positive for - dyspnea on exertion, edema and shortness of breath negative for - chest pain, irregular heartbeat, loss of consciousness, orthopnea or palpitations Gastrointestinal ROS: no abdominal pain, change in bowel habits, or black or bloody stools Musculoskeletal ROS: positive for - gait disturbance, joint stiffness and muscular weakness Neurological ROS: no TIA or stroke symptoms positive for - confusion, gait disturbance, impaired coordination/balance, memory loss and weakness negative for - headaches,  numbness/tingling, seizures or tremors  Objective:  BP 133/60 mmHg  Pulse 60  Temp(Src) 97.9 F (36.6 C) (Oral)  Ht 4\' 11"  (1.499 m)  Wt 146 lb 12.8 oz (66.588 kg)  BMI 29.63 kg/m2  SpO2 80%  BP Readings from Last 3 Encounters:  02/09/15 133/60  12/11/14 146/76  11/10/14 133/58    Wt Readings from Last 3 Encounters:  02/09/15 146 lb 12.8 oz  (66.588 kg)  12/11/14 136 lb (61.689 kg)  11/10/14 138 lb 12.8 oz (62.959 kg)     Physical Exam  Constitutional: She is oriented to person, place, and time. She appears well-developed and well-nourished. No distress.  HENT:  Head: Normocephalic and atraumatic.  Eyes: Conjunctivae are normal. Pupils are equal, round, and reactive to light.  Neck: Normal range of motion. Neck supple. No thyromegaly present.  Cardiovascular: Normal rate, regular rhythm and normal heart sounds.   No murmur heard. Pulmonary/Chest: Effort normal and breath sounds normal. No respiratory distress. She has no wheezes. She has no rales.  Abdominal: Soft. Bowel sounds are normal. She exhibits no distension. There is no tenderness.  Musculoskeletal: Normal range of motion.  Lymphadenopathy:    She has no cervical adenopathy.  Neurological: She is alert and oriented to person, place, and time.  Skin: Skin is warm and dry.  Psychiatric: Her affect is blunt. Her speech is delayed. She is slowed. Cognition and memory are impaired. She expresses inappropriate judgment. She exhibits abnormal recent memory.   .  Lab Results  Component Value Date   WBC 10.0 10/11/2014   HGB 9.2* 06/01/2014   HCT 36.5 10/11/2014   PLT 304 10/11/2014   GLUCOSE 108* 10/17/2014   CHOL 203* 10/11/2014   TRIG 93 10/11/2014   HDL 70 10/11/2014   LDLCALC 114* 10/11/2014   ALT 8 10/17/2014   AST 18 10/17/2014   NA 143 10/17/2014   K 4.3 10/17/2014   CL 99 10/17/2014   CREATININE 1.01* 10/17/2014   BUN 19 10/17/2014   CO2 23 10/17/2014   TSH 3.800 06/01/2014   INR 1.26 08/12/2010    No results found.  Assessment & Plan:   [name] was seen today for shortness of breath.  Diagnoses and all orders for this visit:  Dyspnea -     DME Other see comment -     DG Chest 2 View; Future  Other iron deficiency anemias  Chronic hypoxemic respiratory failure (HCC)  Diastolic dysfunction  Essential hypertension  Long term current  use of anticoagulant  Hypothyroidism, unspecified hypothyroidism type  Permanent atrial fibrillation ([name])  I am having [name] maintain her OXYGEN-HELIUM IN, ferrous sulfate, furosemide, multivitamin with minerals, traMADol, levothyroxine, simvastatin, donepezil, losartan, and ELIQUIS.  No orders of the defined types were placed in this encounter.     Follow-up: No Follow-up on file.  [name], M.D.

## 2015-02-10 ENCOUNTER — Other Ambulatory Visit: Payer: Self-pay | Admitting: Family Medicine

## 2015-02-13 ENCOUNTER — Other Ambulatory Visit: Payer: Self-pay | Admitting: Family Medicine

## 2015-02-14 ENCOUNTER — Telehealth: Payer: Self-pay | Admitting: *Deleted

## 2015-02-14 ENCOUNTER — Other Ambulatory Visit: Payer: Self-pay | Admitting: Family Medicine

## 2015-02-14 DIAGNOSIS — I252 Old myocardial infarction: Secondary | ICD-10-CM | POA: Diagnosis not present

## 2015-02-14 DIAGNOSIS — L97812 Non-pressure chronic ulcer of other part of right lower leg with fat layer exposed: Secondary | ICD-10-CM | POA: Diagnosis not present

## 2015-02-14 DIAGNOSIS — I890 Lymphedema, not elsewhere classified: Secondary | ICD-10-CM | POA: Diagnosis not present

## 2015-02-14 DIAGNOSIS — I272 Other secondary pulmonary hypertension: Secondary | ICD-10-CM | POA: Diagnosis not present

## 2015-02-14 DIAGNOSIS — I872 Venous insufficiency (chronic) (peripheral): Secondary | ICD-10-CM | POA: Diagnosis not present

## 2015-02-14 DIAGNOSIS — Z7901 Long term (current) use of anticoagulants: Secondary | ICD-10-CM | POA: Diagnosis not present

## 2015-02-14 DIAGNOSIS — I482 Chronic atrial fibrillation: Secondary | ICD-10-CM | POA: Diagnosis not present

## 2015-02-14 DIAGNOSIS — Z950 Presence of cardiac pacemaker: Secondary | ICD-10-CM | POA: Diagnosis not present

## 2015-02-14 DIAGNOSIS — I5030 Unspecified diastolic (congestive) heart failure: Secondary | ICD-10-CM | POA: Diagnosis not present

## 2015-02-14 DIAGNOSIS — L97822 Non-pressure chronic ulcer of other part of left lower leg with fat layer exposed: Secondary | ICD-10-CM | POA: Diagnosis not present

## 2015-02-14 DIAGNOSIS — J9611 Chronic respiratory failure with hypoxia: Secondary | ICD-10-CM | POA: Diagnosis not present

## 2015-02-14 DIAGNOSIS — Z9981 Dependence on supplemental oxygen: Secondary | ICD-10-CM | POA: Diagnosis not present

## 2015-02-14 DIAGNOSIS — E46 Unspecified protein-calorie malnutrition: Secondary | ICD-10-CM | POA: Diagnosis not present

## 2015-02-14 DIAGNOSIS — D649 Anemia, unspecified: Secondary | ICD-10-CM | POA: Diagnosis not present

## 2015-02-14 DIAGNOSIS — I2510 Atherosclerotic heart disease of native coronary artery without angina pectoris: Secondary | ICD-10-CM | POA: Diagnosis not present

## 2015-02-14 MED ORDER — FUROSEMIDE 40 MG PO TABS: 40.0000 mg | ORAL_TABLET | Freq: Two times a day (BID) | ORAL | Status: DC

## 2015-02-14 MED ORDER — POTASSIUM CHLORIDE CRYS ER 10 MEQ PO TBCR: 10.0000 meq | EXTENDED_RELEASE_TABLET | Freq: Every day | ORAL | Status: DC

## 2015-02-14 NOTE — Telephone Encounter (Signed)
Spoke with pt's daughter regarding Rxs appt scheduled

## 2015-02-14 NOTE — Telephone Encounter (Signed)
I spoke with Caryl Asp, the patient's daughter. She confirms what was noted below. Additionally she states that they're wrapping her legs due to edema. Review of patient's chest x-ray shows some evidence of heart failure. Therefore I called in a prescription for Lasix 40 mg at breakfast and lunch plus potassium 10 mEq daily. They should do this for one week and follow-up here at the end of that time unless the shortness of breath worsens in which case they should go to the emergency room.  Pool nurse: These arrange for follow-up in the office with me in approximately one week. Thanks, WS.

## 2015-02-14 NOTE — Telephone Encounter (Signed)
Spoke to daughter and she states her mother is on 3L of oxygen all the time, and at rest her O2 is in the high 90's but as soon as she gets up even with 02 her levels drop to 80-83 and she gets really SOB. Please advise.

## 2015-02-15 DIAGNOSIS — I252 Old myocardial infarction: Secondary | ICD-10-CM | POA: Diagnosis not present

## 2015-02-15 DIAGNOSIS — I872 Venous insufficiency (chronic) (peripheral): Secondary | ICD-10-CM | POA: Diagnosis not present

## 2015-02-15 DIAGNOSIS — I272 Other secondary pulmonary hypertension: Secondary | ICD-10-CM | POA: Diagnosis not present

## 2015-02-15 DIAGNOSIS — L97812 Non-pressure chronic ulcer of other part of right lower leg with fat layer exposed: Secondary | ICD-10-CM | POA: Diagnosis not present

## 2015-02-15 DIAGNOSIS — D649 Anemia, unspecified: Secondary | ICD-10-CM | POA: Diagnosis not present

## 2015-02-15 DIAGNOSIS — Z9981 Dependence on supplemental oxygen: Secondary | ICD-10-CM | POA: Diagnosis not present

## 2015-02-15 DIAGNOSIS — L97822 Non-pressure chronic ulcer of other part of left lower leg with fat layer exposed: Secondary | ICD-10-CM | POA: Diagnosis not present

## 2015-02-20 ENCOUNTER — Ambulatory Visit (INDEPENDENT_AMBULATORY_CARE_PROVIDER_SITE_OTHER): Payer: PPO

## 2015-02-20 ENCOUNTER — Ambulatory Visit (INDEPENDENT_AMBULATORY_CARE_PROVIDER_SITE_OTHER): Payer: PPO | Admitting: Family Medicine

## 2015-02-20 ENCOUNTER — Encounter: Payer: Self-pay | Admitting: Family Medicine

## 2015-02-20 VITALS — BP 106/63 | HR 60 | Temp 97.6°F | Ht 59.0 in | Wt 137.0 lb

## 2015-02-20 DIAGNOSIS — R600 Localized edema: Secondary | ICD-10-CM

## 2015-02-20 DIAGNOSIS — I10 Essential (primary) hypertension: Secondary | ICD-10-CM

## 2015-02-20 DIAGNOSIS — I5021 Acute systolic (congestive) heart failure: Secondary | ICD-10-CM

## 2015-02-20 DIAGNOSIS — R0600 Dyspnea, unspecified: Secondary | ICD-10-CM

## 2015-02-20 DIAGNOSIS — R197 Diarrhea, unspecified: Secondary | ICD-10-CM

## 2015-02-20 DIAGNOSIS — I890 Lymphedema, not elsewhere classified: Secondary | ICD-10-CM | POA: Diagnosis not present

## 2015-02-20 MED ORDER — LOMOTIL 2.5-0.025 MG PO TABS: 1.0000 | ORAL_TABLET | Freq: Four times a day (QID) | ORAL | Status: DC | PRN

## 2015-02-20 MED ORDER — FUROSEMIDE 40 MG PO TABS: 40.0000 mg | ORAL_TABLET | Freq: Every day | ORAL | Status: DC

## 2015-02-20 NOTE — Progress Notes (Signed)
Subjective:  Patient ID: [name], female    DOB: 29-May-1933  Age: 82 y.o. MRN: [id number]  CC: Shortness of Breath and Diarrhea   HPI [name] presents for Diarrhea 4-5 watery BM daily for 3 weeks.Follws every time she eats. No abd pain, tenesmus. Denies melena,  Hematochezia.   Gets more dyspneic when she walks. Pulse ox falls into 80s.  No distress, but unsteady. Sx resolve when seated. Denies cough. Legs covered by bandages. Still, can tell edema much less. Appetite is good.   History Oliver has a past medical history of Permanent atrial fibrillation ([place]); Complete heart block ([place]); Diastolic dysfunction; Venous insufficiency; Obesity; Pulmonary hypertension ([place]); Chronic lung disease; Debilitated; Hypertension; Sleep apnea; Rheumatoid arthritis(714.0); and Stroke ([place]).   She has past surgical history that includes Pacemaker insertion (08/13/10).   Her family history is not on file.She reports that she has never smoked. She has never used smokeless tobacco. She reports that she does not drink alcohol or use illicit drugs.    ROS Review of Systems  Constitutional: Positive for fatigue. Negative for fever, activity change and appetite change.  HENT: Negative for congestion, rhinorrhea and sore throat.   Eyes: Negative for visual disturbance.  Respiratory: Positive for shortness of breath. Negative for cough.   Cardiovascular: Positive for leg swelling. Negative for chest pain and palpitations.  Gastrointestinal: Negative for nausea, abdominal pain and diarrhea.  Genitourinary: Negative for dysuria.  Musculoskeletal: Positive for myalgias and arthralgias.  Neurological: Positive for weakness (symmetric). Negative for light-headedness and headaches.    Objective:  BP 106/63 mmHg  Pulse 60  Temp(Src) 97.6 F (36.4 C) (Oral)  Ht 4' 11"  (1.499 m)  Wt 137 lb (62.143 kg)  BMI 27.66 kg/m2  SpO2 94%  BP Readings from Last 3 Encounters:  02/20/15 106/63    02/09/15 133/60  12/11/14 146/76    Wt Readings from Last 3 Encounters:  02/20/15 137 lb (62.143 kg)  02/09/15 146 lb 12.8 oz (66.588 kg)  12/11/14 136 lb (61.689 kg)     Physical Exam  Constitutional: She is oriented to person, place, and time. She appears well-developed and well-nourished. No distress.  HENT:  Head: Normocephalic and atraumatic.  Right Ear: External ear normal.  Left Ear: External ear normal.  Nose: Nose normal.  Mouth/Throat: Oropharynx is clear and moist.  Eyes: Conjunctivae and EOM are normal. Pupils are equal, round, and reactive to light.  Neck: Normal range of motion. Neck supple. No thyromegaly present.  Cardiovascular: Normal rate, regular rhythm and normal heart sounds.   No murmur heard. Pulmonary/Chest: Effort normal. No respiratory distress. She has no wheezes. She has no rales.  Abdominal: Soft. Bowel sounds are normal. She exhibits distension. There is no tenderness.  Lymphadenopathy:    She has no cervical adenopathy.  Neurological: She is alert and oriented to person, place, and time. Coordination (unsteady) abnormal.  Skin: Skin is warm and dry.  Psychiatric: She has a normal mood and affect. Her behavior is normal. Judgment and thought content normal.     Lab Results  Component Value Date   WBC 10.0 10/11/2014   HGB 9.2* 06/01/2014   HCT 36.5 10/11/2014   PLT 304 10/11/2014   GLUCOSE 108* 10/17/2014   CHOL 203* 10/11/2014   TRIG 93 10/11/2014   HDL 70 10/11/2014   LDLCALC 114* 10/11/2014   ALT 8 10/17/2014   AST 18 10/17/2014   NA 143 10/17/2014   K 4.3 10/17/2014   CL  99 10/17/2014   CREATININE 1.01* 10/17/2014   BUN 19 10/17/2014   CO2 23 10/17/2014   TSH 3.800 06/01/2014   INR 1.26 08/12/2010    No results found.  Assessment & Plan:   Kissy was seen today for shortness of breath and diarrhea.  Diagnoses and all orders for this visit:  Dyspnea -     CBC with Differential/Platelet -     CMP14+EGFR -     Brain  natriuretic peptide -     DG Chest 2 View; Future -     DG Abd 2 Views; Future -     Blood Gas, Arterial; Future  Lymphedema of both lower extremities  Essential hypertension  Localized edema -     CBC with Differential/Platelet -     CMP14+EGFR -     Brain natriuretic peptide -     DG Chest 2 View; Future -     DG Abd 2 Views; Future -     Blood Gas, Arterial; Future  Acute systolic congestive heart failure (HCC)  Diarrhea, unspecified type -     Clostridium difficile EIA -     GI Profile, Stool, PCR -     Fecal leukocytes  Other orders -     furosemide (LASIX) 40 MG tablet; Take 1 tablet (40 mg total) by mouth daily. At breakfast & lunch -     LOMOTIL 2.5-0.025 MG tablet; Take 1 tablet by mouth 4 (four) times daily as needed for diarrhea or loose stools.    PT. CXR consistent with improving heart failure. The abd xr shows increased gas and stool.  Diarrhea seems to represent leakage around constipation based on sx that contradict XR Report..  I have changed Ms. Ruacho's furosemide. I am also having her start on LOMOTIL. Additionally, I am having her maintain her OXYGEN-HELIUM IN, ferrous sulfate, multivitamin with minerals, traMADol, levothyroxine, simvastatin, losartan, ELIQUIS, donepezil, and potassium chloride.  Meds ordered this encounter  Medications  . furosemide (LASIX) 40 MG tablet    Sig: Take 1 tablet (40 mg total) by mouth daily. At breakfast & lunch    Dispense:  30 tablet    Refill:  2  . LOMOTIL 2.5-0.025 MG tablet    Sig: Take 1 tablet by mouth 4 (four) times daily as needed for diarrhea or loose stools.    Dispense:  50 tablet    Refill:  0   Consider change from lomotil to laxative if sx xcontinue  Follow-up: Return in about 1 week (around 02/27/2015).  [name], M.D.

## 2015-02-21 DIAGNOSIS — D649 Anemia, unspecified: Secondary | ICD-10-CM | POA: Diagnosis not present

## 2015-02-21 DIAGNOSIS — I872 Venous insufficiency (chronic) (peripheral): Secondary | ICD-10-CM | POA: Diagnosis not present

## 2015-02-21 DIAGNOSIS — I272 Other secondary pulmonary hypertension: Secondary | ICD-10-CM | POA: Diagnosis not present

## 2015-02-21 DIAGNOSIS — Z7901 Long term (current) use of anticoagulants: Secondary | ICD-10-CM | POA: Diagnosis not present

## 2015-02-21 DIAGNOSIS — L97812 Non-pressure chronic ulcer of other part of right lower leg with fat layer exposed: Secondary | ICD-10-CM | POA: Diagnosis not present

## 2015-02-21 DIAGNOSIS — E46 Unspecified protein-calorie malnutrition: Secondary | ICD-10-CM | POA: Diagnosis not present

## 2015-02-21 DIAGNOSIS — I890 Lymphedema, not elsewhere classified: Secondary | ICD-10-CM | POA: Diagnosis not present

## 2015-02-21 DIAGNOSIS — L97822 Non-pressure chronic ulcer of other part of left lower leg with fat layer exposed: Secondary | ICD-10-CM | POA: Diagnosis not present

## 2015-02-21 DIAGNOSIS — J9611 Chronic respiratory failure with hypoxia: Secondary | ICD-10-CM | POA: Diagnosis not present

## 2015-02-21 DIAGNOSIS — I2510 Atherosclerotic heart disease of native coronary artery without angina pectoris: Secondary | ICD-10-CM | POA: Diagnosis not present

## 2015-02-21 DIAGNOSIS — I5030 Unspecified diastolic (congestive) heart failure: Secondary | ICD-10-CM | POA: Diagnosis not present

## 2015-02-21 DIAGNOSIS — I252 Old myocardial infarction: Secondary | ICD-10-CM | POA: Diagnosis not present

## 2015-02-21 DIAGNOSIS — Z950 Presence of cardiac pacemaker: Secondary | ICD-10-CM | POA: Diagnosis not present

## 2015-02-21 DIAGNOSIS — I482 Chronic atrial fibrillation: Secondary | ICD-10-CM | POA: Diagnosis not present

## 2015-02-21 DIAGNOSIS — Z9981 Dependence on supplemental oxygen: Secondary | ICD-10-CM | POA: Diagnosis not present

## 2015-02-21 LAB — CBC WITH DIFFERENTIAL/PLATELET
Basophils Absolute: 0 10*3/uL (ref 0.0–0.2)
Basos: 1 %
EOS (ABSOLUTE): 0.1 10*3/uL (ref 0.0–0.4)
EOS: 1 %
HEMATOCRIT: 37.4 % (ref 34.0–46.6)
Hemoglobin: 12 g/dL (ref 11.1–15.9)
IMMATURE GRANULOCYTES: 0 %
Immature Grans (Abs): 0 10*3/uL (ref 0.0–0.1)
LYMPHS: 25 %
Lymphocytes Absolute: 2 10*3/uL (ref 0.7–3.1)
MCH: 28.1 pg (ref 26.6–33.0)
MCHC: 32.1 g/dL (ref 31.5–35.7)
MCV: 88 fL (ref 79–97)
MONOS ABS: 0.6 10*3/uL (ref 0.1–0.9)
Monocytes: 7 %
NEUTROS PCT: 66 %
Neutrophils Absolute: 5.2 10*3/uL (ref 1.4–7.0)
PLATELETS: 325 10*3/uL (ref 150–379)
RBC: 4.27 x10E6/uL (ref 3.77–5.28)
RDW: 14.5 % (ref 12.3–15.4)
WBC: 7.9 10*3/uL (ref 3.4–10.8)

## 2015-02-21 LAB — BRAIN NATRIURETIC PEPTIDE: BNP: 106.6 pg/mL — ABNORMAL HIGH (ref 0.0–100.0)

## 2015-02-21 LAB — CMP14+EGFR
ALT: 8 IU/L (ref 0–32)
AST: 17 IU/L (ref 0–40)
Albumin/Globulin Ratio: 1.3 (ref 1.1–2.5)
Albumin: 4.1 g/dL (ref 3.5–4.8)
Alkaline Phosphatase: 82 IU/L (ref 39–117)
BUN/Creatinine Ratio: 15 (ref 11–26)
BUN: 18 mg/dL (ref 8–27)
Bilirubin Total: 0.3 mg/dL (ref 0.0–1.2)
CALCIUM: 8.7 mg/dL (ref 8.7–10.3)
CO2: 31 mmol/L — AB (ref 18–29)
CREATININE: 1.17 mg/dL — AB (ref 0.57–1.00)
Chloride: 96 mmol/L (ref 96–106)
GFR calc Af Amer: 51 mL/min/{1.73_m2} — ABNORMAL LOW (ref >59)
GFR, EST NON AFRICAN AMERICAN: 44 mL/min/{1.73_m2} — AB (ref >59)
Globulin, Total: 3.2 g/dL (ref 1.5–4.5)
Glucose: 93 mg/dL (ref 65–99)
Potassium: 3.8 mmol/L (ref 3.5–5.2)
Sodium: 144 mmol/L (ref 134–144)
Total Protein: 7.3 g/dL (ref 6.0–8.5)

## 2015-02-22 ENCOUNTER — Other Ambulatory Visit: Payer: PPO

## 2015-02-22 ENCOUNTER — Other Ambulatory Visit (HOSPITAL_COMMUNITY)
Admission: RE | Admit: 2015-02-22 | Discharge: 2015-02-22 | Disposition: A | Discharge status: Home / Self Care | Payer: PPO | Source: Ambulatory Visit | Attending: Family Medicine | Admitting: Family Medicine

## 2015-02-22 DIAGNOSIS — J449 Chronic obstructive pulmonary disease, unspecified: Secondary | ICD-10-CM | POA: Insufficient documentation

## 2015-02-22 DIAGNOSIS — R197 Diarrhea, unspecified: Secondary | ICD-10-CM

## 2015-02-22 LAB — BLOOD GAS, ARTERIAL
Acid-Base Excess: 6.8 mmol/L — ABNORMAL HIGH (ref 0.0–2.0)
Bicarbonate: 30.3 mEq/L — ABNORMAL HIGH (ref 20.0–24.0)
Drawn by: 277331
O2 CONTENT: 3 L/min
O2 SAT: 95.9 %
PATIENT TEMPERATURE: 37
PCO2 ART: 45 mmHg (ref 35.0–45.0)
pH, Arterial: 7.451 — ABNORMAL HIGH (ref 7.350–7.450)
pO2, Arterial: 80.8 mmHg (ref 80.0–100.0)

## 2015-02-23 ENCOUNTER — Ambulatory Visit: Payer: PPO | Admitting: Family Medicine

## 2015-02-23 ENCOUNTER — Ambulatory Visit: Payer: Commercial Managed Care - HMO | Admitting: Family Medicine

## 2015-02-23 DIAGNOSIS — L97822 Non-pressure chronic ulcer of other part of left lower leg with fat layer exposed: Secondary | ICD-10-CM | POA: Diagnosis not present

## 2015-02-23 DIAGNOSIS — Z9981 Dependence on supplemental oxygen: Secondary | ICD-10-CM | POA: Diagnosis not present

## 2015-02-23 DIAGNOSIS — I872 Venous insufficiency (chronic) (peripheral): Secondary | ICD-10-CM | POA: Diagnosis not present

## 2015-02-23 DIAGNOSIS — I252 Old myocardial infarction: Secondary | ICD-10-CM | POA: Diagnosis not present

## 2015-02-23 DIAGNOSIS — L97812 Non-pressure chronic ulcer of other part of right lower leg with fat layer exposed: Secondary | ICD-10-CM | POA: Diagnosis not present

## 2015-02-23 DIAGNOSIS — D649 Anemia, unspecified: Secondary | ICD-10-CM | POA: Diagnosis not present

## 2015-02-23 DIAGNOSIS — I272 Other secondary pulmonary hypertension: Secondary | ICD-10-CM | POA: Diagnosis not present

## 2015-02-24 DIAGNOSIS — J449 Chronic obstructive pulmonary disease, unspecified: Secondary | ICD-10-CM | POA: Diagnosis not present

## 2015-02-24 DIAGNOSIS — J45909 Unspecified asthma, uncomplicated: Secondary | ICD-10-CM | POA: Diagnosis not present

## 2015-02-27 ENCOUNTER — Ambulatory Visit (INDEPENDENT_AMBULATORY_CARE_PROVIDER_SITE_OTHER): Payer: PPO | Admitting: Family Medicine

## 2015-02-27 ENCOUNTER — Encounter: Payer: Self-pay | Admitting: Family Medicine

## 2015-02-27 VITALS — BP 115/49 | HR 62 | Temp 98.6°F | Ht 59.0 in | Wt 143.8 lb

## 2015-02-27 DIAGNOSIS — J9611 Chronic respiratory failure with hypoxia: Secondary | ICD-10-CM

## 2015-02-27 DIAGNOSIS — I519 Heart disease, unspecified: Secondary | ICD-10-CM | POA: Diagnosis not present

## 2015-02-27 DIAGNOSIS — R0600 Dyspnea, unspecified: Secondary | ICD-10-CM

## 2015-02-27 DIAGNOSIS — R197 Diarrhea, unspecified: Secondary | ICD-10-CM

## 2015-02-27 DIAGNOSIS — R1030 Lower abdominal pain, unspecified: Secondary | ICD-10-CM | POA: Diagnosis not present

## 2015-02-27 DIAGNOSIS — I10 Essential (primary) hypertension: Secondary | ICD-10-CM | POA: Diagnosis not present

## 2015-02-27 DIAGNOSIS — I5189 Other ill-defined heart diseases: Secondary | ICD-10-CM

## 2015-02-27 MED ORDER — FUROSEMIDE 40 MG PO TABS: ORAL_TABLET | ORAL | Status: DC

## 2015-02-27 NOTE — Progress Notes (Signed)
Subjective:  Patient ID: [name], female    DOB: [birth year]  Age: 82 y.o. MRN: [id number]  CC: Shortness of Breath and Diarrhea   HPI [name] presents for Diarrhea 2 loose BM daily for this [facility]. Some incontinence of stool.No longer stooling every time she eats. No abd pain, tenesmus. Denies melena,  Hematochezia.   Gets more dyspneic when she walks. Pulse ox falls into 80s.  No distress, but unsteady. Sx resolve when seated. Denies cough. Legs covered by bandages. Still, can tell edema much less. Appetite is good.   No dyspnea at 4 l. Weight up 6 lb. Taking furosemide 40 mg q day.  History [name] has a past medical history of Permanent atrial fibrillation ([place]); Complete heart block ([place]); Diastolic dysfunction; Venous insufficiency; Obesity; Pulmonary hypertension ([place]); Chronic lung disease; Debilitated; Hypertension; Sleep apnea; Rheumatoid arthritis(714.0); and Stroke ([place]).   She has past surgical history that includes Pacemaker insertion (08/13/10).   Her family history is not on file.She reports that she has never smoked. She has never used smokeless tobacco. She reports that she does not drink alcohol or use illicit drugs.    ROS Review of Systems  Constitutional: Positive for fatigue. Negative for fever, activity change and appetite change.  HENT: Negative for congestion, rhinorrhea and sore throat.   Eyes: Negative for visual disturbance.  Respiratory: Positive for shortness of breath. Negative for cough.   Cardiovascular: Positive for leg swelling. Negative for chest pain and palpitations.  Gastrointestinal: Negative for nausea, abdominal pain and diarrhea.  Genitourinary: Negative for dysuria.  Musculoskeletal: Positive for myalgias and arthralgias.  Neurological: Positive for weakness (symmetric). Negative for light-headedness and headaches.    Objective:  BP 115/49 mmHg  Pulse 62  Temp(Src) 98.6 F (37 C) (Oral)  Ht 4' 11"  (1.499 m)  Wt  143 lb 12.8 oz (65.227 kg)  BMI 29.03 kg/m2  SpO2 99%  BP Readings from Last 3 Encounters:  02/27/15 115/49  02/20/15 106/63  02/09/15 133/60    Wt Readings from Last 3 Encounters:  02/27/15 143 lb 12.8 oz (65.227 kg)  02/20/15 137 lb (62.143 kg)  02/09/15 146 lb 12.8 oz (66.588 kg)     Physical Exam  Constitutional: She is oriented to person, place, and time. She appears well-developed and well-nourished. No distress.  HENT:  Head: Normocephalic and atraumatic.  Right Ear: External ear normal.  Left Ear: External ear normal.  Nose: Nose normal.  Mouth/Throat: Oropharynx is clear and moist.  Eyes: Conjunctivae and EOM are normal. Pupils are equal, round, and reactive to light.  Neck: Normal range of motion. Neck supple. No thyromegaly present.  Cardiovascular: Normal rate, regular rhythm and normal heart sounds.   No murmur heard. Pulmonary/Chest: Effort normal. No respiratory distress. She has no wheezes. She has no rales.  Abdominal: Soft. Bowel sounds are normal. She exhibits distension. There is no tenderness.  Lymphadenopathy:    She has no cervical adenopathy.  Neurological: She is alert and oriented to person, place, and time. Coordination (unsteady) abnormal.  Skin: Skin is warm and dry.  Psychiatric: She has a normal mood and affect. Her behavior is normal. Judgment and thought content normal.      Lab Results  Component Value Date   WBC 7.9 02/20/2015   HGB 9.2* 06/01/2014   HCT 37.4 02/20/2015   PLT 325 02/20/2015   GLUCOSE 93 02/20/2015   CHOL 203* 10/11/2014   TRIG 93 10/11/2014   HDL 70 10/11/2014  LDLCALC 114* 10/11/2014   ALT 8 02/20/2015   AST 17 02/20/2015   NA 144 02/20/2015   K 3.8 02/20/2015   CL 96 02/20/2015   CREATININE 1.17* 02/20/2015   BUN 18 02/20/2015   CO2 31* 02/20/2015   TSH 3.800 06/01/2014   INR 1.26 08/12/2010    No results found.  Assessment & Plan:   [name] was seen today for shortness of breath and  diarrhea.  Diagnoses and all orders for this visit:  Chronic hypoxemic respiratory failure ([place]) -     Cancel: Blood Gas, Arterial -     VQQ5+ZDGL  Diastolic dysfunction -     Cancel: Blood Gas, Arterial -     BMP8+EGFR  Dyspnea -     Cancel: Blood Gas, Arterial -     BMP8+EGFR  Essential hypertension -     BMP8+EGFR  Diarrhea, unspecified type -     BMP8+EGFR -     CT Abdomen Pelvis W Wo Contrast; Future  Lower abdominal pain  Other orders -     furosemide (LASIX) 40 MG tablet; One at breakfast and 1/2 at lunch   PT. CXR consistent with improving heart failure. The abd xr shows increased gas and stool.  Diarrhea seems to represent leakage around constipation based on sx that contradict XR Report..  I have changed [name]'s furosemide. I am also having her maintain her OXYGEN-HELIUM IN, ferrous sulfate, multivitamin with minerals, traMADol, levothyroxine, simvastatin, losartan, ELIQUIS, donepezil, potassium chloride, and LOMOTIL.  Meds ordered this encounter  Medications  . furosemide (LASIX) 40 MG tablet    Sig: One at breakfast and 1/2 at lunch    Dispense:  45 tablet    Refill:  2   Consider change from lomotil to laxative if sx xcontinue  Follow-up: Return in about 1 week (around 03/06/2015).  [name], M.D.

## 2015-02-28 DIAGNOSIS — I5030 Unspecified diastolic (congestive) heart failure: Secondary | ICD-10-CM | POA: Diagnosis not present

## 2015-02-28 DIAGNOSIS — I482 Chronic atrial fibrillation: Secondary | ICD-10-CM | POA: Diagnosis not present

## 2015-02-28 DIAGNOSIS — D649 Anemia, unspecified: Secondary | ICD-10-CM | POA: Diagnosis not present

## 2015-02-28 DIAGNOSIS — Z7901 Long term (current) use of anticoagulants: Secondary | ICD-10-CM | POA: Diagnosis not present

## 2015-02-28 DIAGNOSIS — J9611 Chronic respiratory failure with hypoxia: Secondary | ICD-10-CM | POA: Diagnosis not present

## 2015-02-28 DIAGNOSIS — E46 Unspecified protein-calorie malnutrition: Secondary | ICD-10-CM | POA: Diagnosis not present

## 2015-02-28 DIAGNOSIS — Z950 Presence of cardiac pacemaker: Secondary | ICD-10-CM | POA: Diagnosis not present

## 2015-02-28 DIAGNOSIS — I272 Other secondary pulmonary hypertension: Secondary | ICD-10-CM | POA: Diagnosis not present

## 2015-02-28 DIAGNOSIS — I890 Lymphedema, not elsewhere classified: Secondary | ICD-10-CM | POA: Diagnosis not present

## 2015-02-28 DIAGNOSIS — I2510 Atherosclerotic heart disease of native coronary artery without angina pectoris: Secondary | ICD-10-CM | POA: Diagnosis not present

## 2015-02-28 DIAGNOSIS — L97812 Non-pressure chronic ulcer of other part of right lower leg with fat layer exposed: Secondary | ICD-10-CM | POA: Diagnosis not present

## 2015-02-28 DIAGNOSIS — L97822 Non-pressure chronic ulcer of other part of left lower leg with fat layer exposed: Secondary | ICD-10-CM | POA: Diagnosis not present

## 2015-02-28 DIAGNOSIS — Z9981 Dependence on supplemental oxygen: Secondary | ICD-10-CM | POA: Diagnosis not present

## 2015-02-28 DIAGNOSIS — I872 Venous insufficiency (chronic) (peripheral): Secondary | ICD-10-CM | POA: Diagnosis not present

## 2015-02-28 DIAGNOSIS — I252 Old myocardial infarction: Secondary | ICD-10-CM | POA: Diagnosis not present

## 2015-02-28 LAB — FECAL LEUKOCYTES

## 2015-03-01 ENCOUNTER — Ambulatory Visit (HOSPITAL_COMMUNITY)
Admission: RE | Admit: 2015-03-01 | Discharge: 2015-03-01 | Disposition: A | Discharge status: Home / Self Care | Payer: PPO | Source: Ambulatory Visit | Attending: Family Medicine | Admitting: Family Medicine

## 2015-03-01 ENCOUNTER — Other Ambulatory Visit: Payer: Self-pay | Admitting: *Deleted

## 2015-03-01 DIAGNOSIS — R7981 Abnormal blood-gas level: Secondary | ICD-10-CM

## 2015-03-01 DIAGNOSIS — R0600 Dyspnea, unspecified: Secondary | ICD-10-CM | POA: Diagnosis not present

## 2015-03-01 LAB — BLOOD GAS, ARTERIAL
Acid-Base Excess: 6.5 mmol/L — ABNORMAL HIGH (ref 0.0–2.0)
BICARBONATE: 29.9 meq/L — AB (ref 20.0–24.0)
Drawn by: 234301
O2 Content: 4 L/min
O2 Saturation: 95.5 %
PH ART: 7.431 (ref 7.350–7.450)
PO2 ART: 79 mmHg — AB (ref 80.0–100.0)
pCO2 arterial: 47.2 mmHg — ABNORMAL HIGH (ref 35.0–45.0)

## 2015-03-01 LAB — CDIFF NAA+O+P+STOOL CULTURE
E coli, Shiga toxin Assay: NEGATIVE
Toxigenic C. Difficile by PCR: NEGATIVE

## 2015-03-06 ENCOUNTER — Ambulatory Visit (INDEPENDENT_AMBULATORY_CARE_PROVIDER_SITE_OTHER): Payer: PPO | Admitting: Family Medicine

## 2015-03-06 ENCOUNTER — Encounter: Payer: Self-pay | Admitting: Family Medicine

## 2015-03-06 VITALS — BP 114/60 | HR 60 | Temp 97.2°F | Ht 59.0 in | Wt 143.8 lb

## 2015-03-06 DIAGNOSIS — R197 Diarrhea, unspecified: Secondary | ICD-10-CM | POA: Diagnosis not present

## 2015-03-06 DIAGNOSIS — I10 Essential (primary) hypertension: Secondary | ICD-10-CM | POA: Diagnosis not present

## 2015-03-06 DIAGNOSIS — J9611 Chronic respiratory failure with hypoxia: Secondary | ICD-10-CM | POA: Diagnosis not present

## 2015-03-06 NOTE — Patient Instructions (Signed)
Colace 100 mg twice daily for stool softener. I will advance treatment as needed based on results of tomorrows CT of the abdomen.

## 2015-03-06 NOTE — Progress Notes (Signed)
Subjective:  Patient ID: [name], female    DOB: Oct 18, 1933  Age: 82 y.o. MRN: [id number]  CC: Diarrhea and Arthritis   HPI [name] presents for [place] arthritis pain in shoulders, and knees as well as hands. Breathing much better. Had ABG a few days ago showing 4l [place] did not raise her CO2. She is breathing well. Not swollen. However she continues to have multiple daily BMs. They are loose. Watery. She is unaware they are happening and/or unable to control them.There is no abdominal pain except occasional cramp with BM. XR 2 weeks ago showed lg stool burden. Pt. To have CT tomorrow.    History [name] has a past medical history of Permanent atrial fibrillation ([place]); Complete heart block ([place]); Diastolic dysfunction; Venous insufficiency; Obesity; Pulmonary hypertension ([place]); Chronic lung disease; Debilitated; Hypertension; Sleep apnea; Rheumatoid arthritis(714.0); and Stroke ([place]).   She has past surgical history that includes Pacemaker insertion (08/13/10).   Her family history is not on file.She reports that she has never smoked. She has never used smokeless tobacco. She reports that she does not drink alcohol or use illicit drugs.    ROS Review of Systems  Constitutional: Negative for fever, activity change and appetite change.  HENT: Negative for congestion, rhinorrhea and sore throat.   Eyes: Negative for visual disturbance.  Respiratory: Negative for cough and shortness of breath.   Cardiovascular: Negative for chest pain and palpitations.  Gastrointestinal: Negative for nausea, abdominal pain and diarrhea.  Genitourinary: Negative for dysuria.  Musculoskeletal: Negative for myalgias and arthralgias.    Objective:  BP 114/60 mmHg  Pulse 60  Temp(Src) 97.2 F (36.2 C) (Oral)  Ht 4\' 11"  (1.499 m)  Wt 143 lb 12.8 oz (65.227 kg)  BMI 29.03 kg/m2  SpO2 95%  BP Readings from Last 3 Encounters:  03/06/15 114/60  02/27/15 115/49  02/20/15 106/63     Wt Readings from Last 3 Encounters:  03/06/15 143 lb 12.8 oz (65.227 kg)  02/27/15 143 lb 12.8 oz (65.227 kg)  02/20/15 137 lb (62.143 kg)     Physical Exam  Constitutional: She is oriented to person, place, and time. She appears well-developed and well-nourished. No distress.  HENT:  Head: Normocephalic and atraumatic.  Right Ear: External ear normal.  Left Ear: External ear normal.  Nose: Nose normal.  Mouth/Throat: Oropharynx is clear and moist.  Eyes: Conjunctivae and EOM are normal. Pupils are equal, round, and reactive to light.  Neck: Normal range of motion. Neck supple. No thyromegaly present.  Cardiovascular: Normal rate, regular rhythm and normal heart sounds.   No murmur heard. Pulmonary/Chest: Effort normal and breath sounds normal. No respiratory distress. She has no wheezes. She has no rales.  Abdominal: Soft. Bowel sounds are normal. She exhibits no distension. There is no tenderness.  Lymphadenopathy:    She has no cervical adenopathy.  Neurological: She is alert and oriented to person, place, and time. She has normal reflexes.  Skin: Skin is warm and dry.  Psychiatric: She has a normal mood and affect. Her behavior is normal. Judgment and thought content normal.     Lab Results  Component Value Date   WBC 7.9 02/20/2015   HGB 9.2* 06/01/2014   HCT 37.4 02/20/2015   PLT 325 02/20/2015   GLUCOSE 93 02/20/2015   CHOL 203* 10/11/2014   TRIG 93 10/11/2014   HDL 70 10/11/2014   LDLCALC 114* 10/11/2014   ALT 8 02/20/2015   AST 17 02/20/2015  NA 144 02/20/2015   K 3.8 02/20/2015   CL 96 02/20/2015   CREATININE 1.17* 02/20/2015   BUN 18 02/20/2015   CO2 31* 02/20/2015   TSH 3.800 06/01/2014   INR 1.26 08/12/2010    No results found.  Assessment & Plan:   [name] was seen today for diarrhea and arthritis.  Diagnoses and all orders for this visit:  Diarrhea, unspecified type  Chronic hypoxemic respiratory failure ([place])  Essential  hypertension   Continue O2 as is for now. 4 l [place].   I am having [name] maintain her OXYGEN-HELIUM IN, ferrous sulfate, multivitamin with minerals, traMADol, levothyroxine, simvastatin, losartan, ELIQUIS, donepezil, potassium chloride, LOMOTIL, and furosemide.  Colace 100 mg twice daily for stool softener. I will advance treatment as needed based on results of tomorrows CT of the abdomen. I encouraged her to use tramadol as needed/ tolerated, but I do not want her to use an NSAID for now. Celebrex could be an option if sx continue or tramadol poorly tolerated.  Colace 100 mg twice daily for stool softener. I will advance treatment as needed based on results of tomorrows CT of the abdomen.  Follow-up: Return in about 1 month (around 04/03/2015), or if symptoms worsen or fail to improve.  [name], M.D.

## 2015-03-07 ENCOUNTER — Ambulatory Visit (HOSPITAL_COMMUNITY)
Admission: RE | Admit: 2015-03-07 | Discharge: 2015-03-07 | Disposition: A | Discharge status: Home / Self Care | Payer: PPO | Source: Ambulatory Visit | Attending: Family Medicine | Admitting: Family Medicine

## 2015-03-07 DIAGNOSIS — I872 Venous insufficiency (chronic) (peripheral): Secondary | ICD-10-CM | POA: Diagnosis not present

## 2015-03-07 DIAGNOSIS — D3502 Benign neoplasm of left adrenal gland: Secondary | ICD-10-CM | POA: Diagnosis not present

## 2015-03-07 DIAGNOSIS — D649 Anemia, unspecified: Secondary | ICD-10-CM | POA: Diagnosis not present

## 2015-03-07 DIAGNOSIS — I272 Other secondary pulmonary hypertension: Secondary | ICD-10-CM | POA: Diagnosis not present

## 2015-03-07 DIAGNOSIS — I890 Lymphedema, not elsewhere classified: Secondary | ICD-10-CM | POA: Diagnosis not present

## 2015-03-07 DIAGNOSIS — Z9981 Dependence on supplemental oxygen: Secondary | ICD-10-CM | POA: Diagnosis not present

## 2015-03-07 DIAGNOSIS — I5030 Unspecified diastolic (congestive) heart failure: Secondary | ICD-10-CM | POA: Diagnosis not present

## 2015-03-07 DIAGNOSIS — I700 Atherosclerosis of aorta: Secondary | ICD-10-CM | POA: Diagnosis not present

## 2015-03-07 DIAGNOSIS — R197 Diarrhea, unspecified: Secondary | ICD-10-CM | POA: Diagnosis not present

## 2015-03-07 DIAGNOSIS — I482 Chronic atrial fibrillation: Secondary | ICD-10-CM | POA: Diagnosis not present

## 2015-03-07 DIAGNOSIS — E46 Unspecified protein-calorie malnutrition: Secondary | ICD-10-CM | POA: Diagnosis not present

## 2015-03-07 DIAGNOSIS — L97812 Non-pressure chronic ulcer of other part of right lower leg with fat layer exposed: Secondary | ICD-10-CM | POA: Diagnosis not present

## 2015-03-07 DIAGNOSIS — I252 Old myocardial infarction: Secondary | ICD-10-CM | POA: Diagnosis not present

## 2015-03-07 DIAGNOSIS — J9611 Chronic respiratory failure with hypoxia: Secondary | ICD-10-CM | POA: Diagnosis not present

## 2015-03-07 DIAGNOSIS — N200 Calculus of kidney: Secondary | ICD-10-CM | POA: Diagnosis not present

## 2015-03-07 DIAGNOSIS — I2510 Atherosclerotic heart disease of native coronary artery without angina pectoris: Secondary | ICD-10-CM | POA: Diagnosis not present

## 2015-03-07 DIAGNOSIS — L97822 Non-pressure chronic ulcer of other part of left lower leg with fat layer exposed: Secondary | ICD-10-CM | POA: Diagnosis not present

## 2015-03-07 DIAGNOSIS — Z7901 Long term (current) use of anticoagulants: Secondary | ICD-10-CM | POA: Diagnosis not present

## 2015-03-07 DIAGNOSIS — Z950 Presence of cardiac pacemaker: Secondary | ICD-10-CM | POA: Diagnosis not present

## 2015-03-07 MED ORDER — IOHEXOL 300 MG/ML  SOLN
100.0000 mL | Freq: Once | INTRAMUSCULAR | Status: AC | PRN
  Administered 2015-03-07: 100 mL via INTRAVENOUS

## 2015-03-14 DIAGNOSIS — E46 Unspecified protein-calorie malnutrition: Secondary | ICD-10-CM | POA: Diagnosis not present

## 2015-03-14 DIAGNOSIS — I272 Other secondary pulmonary hypertension: Secondary | ICD-10-CM | POA: Diagnosis not present

## 2015-03-14 DIAGNOSIS — L97812 Non-pressure chronic ulcer of other part of right lower leg with fat layer exposed: Secondary | ICD-10-CM | POA: Diagnosis not present

## 2015-03-14 DIAGNOSIS — I890 Lymphedema, not elsewhere classified: Secondary | ICD-10-CM | POA: Diagnosis not present

## 2015-03-14 DIAGNOSIS — I5030 Unspecified diastolic (congestive) heart failure: Secondary | ICD-10-CM | POA: Diagnosis not present

## 2015-03-14 DIAGNOSIS — D649 Anemia, unspecified: Secondary | ICD-10-CM | POA: Diagnosis not present

## 2015-03-14 DIAGNOSIS — Z7901 Long term (current) use of anticoagulants: Secondary | ICD-10-CM | POA: Diagnosis not present

## 2015-03-14 DIAGNOSIS — I2510 Atherosclerotic heart disease of native coronary artery without angina pectoris: Secondary | ICD-10-CM | POA: Diagnosis not present

## 2015-03-14 DIAGNOSIS — Z950 Presence of cardiac pacemaker: Secondary | ICD-10-CM | POA: Diagnosis not present

## 2015-03-14 DIAGNOSIS — I482 Chronic atrial fibrillation: Secondary | ICD-10-CM | POA: Diagnosis not present

## 2015-03-14 DIAGNOSIS — Z9981 Dependence on supplemental oxygen: Secondary | ICD-10-CM | POA: Diagnosis not present

## 2015-03-14 DIAGNOSIS — I252 Old myocardial infarction: Secondary | ICD-10-CM | POA: Diagnosis not present

## 2015-03-14 DIAGNOSIS — I872 Venous insufficiency (chronic) (peripheral): Secondary | ICD-10-CM | POA: Diagnosis not present

## 2015-03-14 DIAGNOSIS — J9611 Chronic respiratory failure with hypoxia: Secondary | ICD-10-CM | POA: Diagnosis not present

## 2015-03-14 DIAGNOSIS — L97822 Non-pressure chronic ulcer of other part of left lower leg with fat layer exposed: Secondary | ICD-10-CM | POA: Diagnosis not present

## 2015-03-15 DIAGNOSIS — L97812 Non-pressure chronic ulcer of other part of right lower leg with fat layer exposed: Secondary | ICD-10-CM | POA: Diagnosis not present

## 2015-03-15 DIAGNOSIS — I272 Other secondary pulmonary hypertension: Secondary | ICD-10-CM | POA: Diagnosis not present

## 2015-03-15 DIAGNOSIS — Z9981 Dependence on supplemental oxygen: Secondary | ICD-10-CM | POA: Diagnosis not present

## 2015-03-15 DIAGNOSIS — D649 Anemia, unspecified: Secondary | ICD-10-CM | POA: Diagnosis not present

## 2015-03-15 DIAGNOSIS — I872 Venous insufficiency (chronic) (peripheral): Secondary | ICD-10-CM | POA: Diagnosis not present

## 2015-03-15 DIAGNOSIS — L97822 Non-pressure chronic ulcer of other part of left lower leg with fat layer exposed: Secondary | ICD-10-CM | POA: Diagnosis not present

## 2015-03-15 DIAGNOSIS — I252 Old myocardial infarction: Secondary | ICD-10-CM | POA: Diagnosis not present

## 2015-03-19 ENCOUNTER — Encounter: Payer: Self-pay | Admitting: Family Medicine

## 2015-03-19 ENCOUNTER — Ambulatory Visit (INDEPENDENT_AMBULATORY_CARE_PROVIDER_SITE_OTHER): Payer: PPO | Admitting: Family Medicine

## 2015-03-19 VITALS — BP 110/60 | HR 60 | Temp 98.1°F | Ht 59.0 in | Wt 143.4 lb

## 2015-03-19 DIAGNOSIS — I10 Essential (primary) hypertension: Secondary | ICD-10-CM | POA: Diagnosis not present

## 2015-03-19 DIAGNOSIS — F02818 Dementia in other diseases classified elsewhere, unspecified severity, with other behavioral disturbance: Secondary | ICD-10-CM

## 2015-03-19 DIAGNOSIS — I8312 Varicose veins of left lower extremity with inflammation: Secondary | ICD-10-CM | POA: Diagnosis not present

## 2015-03-19 DIAGNOSIS — I872 Venous insufficiency (chronic) (peripheral): Secondary | ICD-10-CM

## 2015-03-19 DIAGNOSIS — R197 Diarrhea, unspecified: Secondary | ICD-10-CM | POA: Diagnosis not present

## 2015-03-19 DIAGNOSIS — G308 Other Alzheimer's disease: Secondary | ICD-10-CM

## 2015-03-19 DIAGNOSIS — I8311 Varicose veins of right lower extremity with inflammation: Secondary | ICD-10-CM

## 2015-03-19 DIAGNOSIS — F0281 Dementia in other diseases classified elsewhere with behavioral disturbance: Secondary | ICD-10-CM

## 2015-03-19 DIAGNOSIS — G309 Alzheimer's disease, unspecified: Principal | ICD-10-CM

## 2015-03-19 DIAGNOSIS — I890 Lymphedema, not elsewhere classified: Secondary | ICD-10-CM

## 2015-03-19 NOTE — Progress Notes (Signed)
Subjective:  Patient ID: [name], female    DOB: 04-14-1933  Age: 82 y.o. MRN: [id number]  CC: Constipation and leg wounds   HPI [name] presents for  Stools soft, not runny. A little pain before BM. Stool 1-2/ day. Daughter states she was abusive to family members wonders about dementia worsening. Requests  by My Chart that pt. Be evaluated for change in dementia, but scared to say anything in the exam room because pt. (her mother) will be vengeful.   GAD 7 : Generalized Anxiety Score 03/19/2015  Nervous, Anxious, on Edge 0  Control/stop worrying 0  Worry too much - different things 0  Trouble relaxing 0  Restless 0  Easily annoyed or irritable 0  Afraid - awful might happen 0  Total GAD 7 Score 0     Depression screen [facility] 2/9 03/19/2015 02/09/2015 10/11/2014 07/24/2014 04/24/2014  Decreased Interest 0 0 0 0 0  Down, Depressed, Hopeless 0 0 0 0 0  PHQ - 2 Score 0 0 0 0 0       History [name] has a past medical history of Permanent atrial fibrillation ([place]); Complete heart block ([place]); Diastolic dysfunction; Venous insufficiency; Obesity; Pulmonary hypertension ([place]); Chronic lung disease; Debilitated; Hypertension; Sleep apnea; Rheumatoid arthritis(714.0); and Stroke ([place]).   She has past surgical history that includes Pacemaker insertion (08/13/10).   Her family history is not on file.She reports that she has never smoked. She has never used smokeless tobacco. She reports that she does not drink alcohol or use illicit drugs.    ROS Review of Systems  Constitutional: Negative for fever, activity change and appetite change.  HENT: Negative for congestion, rhinorrhea and sore throat.   Eyes: Negative for visual disturbance.  Respiratory: Negative for cough and shortness of breath.   Cardiovascular: Positive for leg swelling. Negative for chest pain and palpitations.  Gastrointestinal: Negative for nausea, abdominal pain and diarrhea.  Genitourinary: Negative  for dysuria.  Musculoskeletal: Positive for joint swelling. Negative for myalgias and arthralgias.  Skin: Positive for color change (legs purple - usually has dreessings on due to vneous insufficiency).  Psychiatric/Behavioral: Positive for behavioral problems, dysphoric mood and agitation. The patient is nervous/anxious.     Objective:  BP 110/60 mmHg  Pulse 60  Temp(Src) 98.1 F (36.7 C) (Oral)  Ht 4\' 11"  (1.499 m)  Wt 143 lb 6.4 oz (65.046 kg)  BMI 28.95 kg/m2  SpO2 96%  BP Readings from Last 3 Encounters:  03/19/15 110/60  03/06/15 114/60  02/27/15 115/49    Wt Readings from Last 3 Encounters:  03/19/15 143 lb 6.4 oz (65.046 kg)  03/06/15 143 lb 12.8 oz (65.227 kg)  02/27/15 143 lb 12.8 oz (65.227 kg)     Physical Exam  Constitutional: She is oriented to person, place, and time. She appears well-developed and well-nourished. No distress.  HENT:  Head: Normocephalic and atraumatic.  Right Ear: External ear normal.  Left Ear: External ear normal.  Nose: Nose normal.  Mouth/Throat: Oropharynx is clear and moist.  Eyes: Conjunctivae and EOM are normal. Pupils are equal, round, and reactive to light.  Neck: Normal range of motion. Neck supple. No thyromegaly present.  Cardiovascular: Normal rate, regular rhythm and normal heart sounds.   No murmur heard. Pulmonary/Chest: Effort normal and breath sounds normal. No respiratory distress. She has no wheezes. She has no rales.  Abdominal: Soft. Bowel sounds are normal. She exhibits no distension. There is no tenderness.  Musculoskeletal: She exhibits  edema.  Lymphadenopathy:    She has no cervical adenopathy.  Neurological: She is alert and oriented to person, place, and time. She has normal reflexes.  Skin: Skin is warm and dry. There is erythema (purpuric legs from knee to toe bilaterally with thin skin and veins visible underneath).  Psychiatric: She has a normal mood and affect. Her behavior is normal. Judgment and  thought content normal.   MMSE - Mini Mental State Exam 03/19/2015  Orientation to time 4  Orientation to Place 5  Registration 3  Attention/ Calculation 5  Language- repeat 0  Language- follow 3 step command 2  Language- read & follow direction 1  Write a sentence 1  Copy design 1  Recall             3 Language - Name 2 objects         2 Total             27   Lab Results  Component Value Date   WBC 7.9 02/20/2015   HGB 9.2* 06/01/2014   HCT 37.4 02/20/2015   PLT 325 02/20/2015   GLUCOSE 93 02/20/2015   CHOL 203* 10/11/2014   TRIG 93 10/11/2014   HDL 70 10/11/2014   LDLCALC 114* 10/11/2014   ALT 8 02/20/2015   AST 17 02/20/2015   NA 144 02/20/2015   K 3.8 02/20/2015   CL 96 02/20/2015   CREATININE 1.17* 02/20/2015   BUN 18 02/20/2015   CO2 31* 02/20/2015   TSH 3.800 06/01/2014   INR 1.26 08/12/2010    Ct Abdomen Pelvis W Wo Contrast  03/07/2015  CLINICAL DATA:  Diarrhea for 2-3 weeks EXAM: CT ABDOMEN AND PELVIS WITHOUT AND WITH CONTRAST TECHNIQUE: Multidetector CT imaging of the abdomen and pelvis was performed following the standard protocol before and following the bolus administration of intravenous contrast. CONTRAST:  171mL OMNIPAQUE IOHEXOL 300 MG/ML  SOLN COMPARISON:  None FINDINGS: Lower chest: No pleural fluid identified. The lung bases are clear. Hepatobiliary: No focal liver abnormality. The gallbladder is normal. No biliary dilatation. Pancreas: Normal appearance of the pancreas. Spleen: Negative Adrenals/Urinary Tract: The right adrenal gland is normal. Low-attenuation nodule in the left adrenal gland measures -7 Hounsfield units compatible with a benign adenoma. Bilateral renal calculi noted. The largest is in the upper pole of left kidney measuring 5 mm. No hydronephrosis or hydroureter. The urinary bladder appears normal. Stomach/Bowel: The stomach is unremarkable. The small bowel loops have a normal course and caliber. No obstruction. Normal appearance of the  colon. No evidence for colitis. Specifically there is no bowel wall thickening or inflammation. Vascular/Lymphatic: Calcified atherosclerotic disease involves the abdominal aorta. No aneurysm. No enlarged retroperitoneal or mesenteric adenopathy. No enlarged pelvic or inguinal lymph nodes. Reproductive: The uterus and the adnexal structures are unremarkable for patient's age. There is a right external iliac lymph node which measures 9 mm, image 50 of series 5. Other: No free fluid or fluid collections within the abdomen or pelvis. Musculoskeletal: No aggressive lytic or sclerotic bone lesions. The bones appear osteopenic. There is degenerative disc disease identified at the L5-S1 level. A mild anterolisthesis of L5 on S1 noted. IMPRESSION: 1. No acute findings identified within the abdomen or pelvis. 2. Bilateral kidney stones. 3. Aortic atherosclerosis 4. Left adrenal adenoma. 5. Lumbar spondylosis. Electronically Signed   By: Kerby Moors M.D.   On: 03/07/2015 16:21    Assessment & Plan:   [name] was seen today for constipation and leg  wounds.  Diagnoses and all orders for this visit:  Dementia of Alzheimer's type with behavioral disturbance  Essential hypertension  Lymphedema of both lower extremities  Stasis dermatitis of both legs  Diarrhea, unspecified type   Diarhhea essentially resolved. DC lomotil, consider miralax if goes greater than 2 days without BM.   I have discontinued Ms. Wesely LOMOTIL. I am also having her maintain her OXYGEN-HELIUM IN, ferrous sulfate, multivitamin with minerals, traMADol, levothyroxine, simvastatin, losartan, ELIQUIS, donepezil, potassium chloride, and furosemide.  No orders of the defined types were placed in this encounter.     Follow-up: Return in about 3 months (around 06/16/2015).  [name], M.D.

## 2015-03-19 NOTE — Patient Instructions (Signed)
Take miralax if you go more than one day without a BM

## 2015-03-21 DIAGNOSIS — Z9981 Dependence on supplemental oxygen: Secondary | ICD-10-CM | POA: Diagnosis not present

## 2015-03-21 DIAGNOSIS — E46 Unspecified protein-calorie malnutrition: Secondary | ICD-10-CM | POA: Diagnosis not present

## 2015-03-21 DIAGNOSIS — I5030 Unspecified diastolic (congestive) heart failure: Secondary | ICD-10-CM | POA: Diagnosis not present

## 2015-03-21 DIAGNOSIS — I2510 Atherosclerotic heart disease of native coronary artery without angina pectoris: Secondary | ICD-10-CM | POA: Diagnosis not present

## 2015-03-21 DIAGNOSIS — I890 Lymphedema, not elsewhere classified: Secondary | ICD-10-CM | POA: Diagnosis not present

## 2015-03-21 DIAGNOSIS — Z7901 Long term (current) use of anticoagulants: Secondary | ICD-10-CM | POA: Diagnosis not present

## 2015-03-21 DIAGNOSIS — L97822 Non-pressure chronic ulcer of other part of left lower leg with fat layer exposed: Secondary | ICD-10-CM | POA: Diagnosis not present

## 2015-03-21 DIAGNOSIS — D649 Anemia, unspecified: Secondary | ICD-10-CM | POA: Diagnosis not present

## 2015-03-21 DIAGNOSIS — I482 Chronic atrial fibrillation: Secondary | ICD-10-CM | POA: Diagnosis not present

## 2015-03-21 DIAGNOSIS — J9611 Chronic respiratory failure with hypoxia: Secondary | ICD-10-CM | POA: Diagnosis not present

## 2015-03-21 DIAGNOSIS — L97812 Non-pressure chronic ulcer of other part of right lower leg with fat layer exposed: Secondary | ICD-10-CM | POA: Diagnosis not present

## 2015-03-21 DIAGNOSIS — I252 Old myocardial infarction: Secondary | ICD-10-CM | POA: Diagnosis not present

## 2015-03-21 DIAGNOSIS — I872 Venous insufficiency (chronic) (peripheral): Secondary | ICD-10-CM | POA: Diagnosis not present

## 2015-03-21 DIAGNOSIS — I272 Other secondary pulmonary hypertension: Secondary | ICD-10-CM | POA: Diagnosis not present

## 2015-03-21 DIAGNOSIS — Z950 Presence of cardiac pacemaker: Secondary | ICD-10-CM | POA: Diagnosis not present

## 2015-03-22 ENCOUNTER — Ambulatory Visit: Payer: PPO | Admitting: Family Medicine

## 2015-03-23 ENCOUNTER — Other Ambulatory Visit: Payer: Self-pay | Admitting: Family Medicine

## 2015-03-27 DIAGNOSIS — J449 Chronic obstructive pulmonary disease, unspecified: Secondary | ICD-10-CM | POA: Diagnosis not present

## 2015-03-27 DIAGNOSIS — J45909 Unspecified asthma, uncomplicated: Secondary | ICD-10-CM | POA: Diagnosis not present

## 2015-03-28 DIAGNOSIS — L97812 Non-pressure chronic ulcer of other part of right lower leg with fat layer exposed: Secondary | ICD-10-CM | POA: Diagnosis not present

## 2015-03-28 DIAGNOSIS — Z950 Presence of cardiac pacemaker: Secondary | ICD-10-CM | POA: Diagnosis not present

## 2015-03-28 DIAGNOSIS — I872 Venous insufficiency (chronic) (peripheral): Secondary | ICD-10-CM | POA: Diagnosis not present

## 2015-03-28 DIAGNOSIS — I2510 Atherosclerotic heart disease of native coronary artery without angina pectoris: Secondary | ICD-10-CM | POA: Diagnosis not present

## 2015-03-28 DIAGNOSIS — I5030 Unspecified diastolic (congestive) heart failure: Secondary | ICD-10-CM | POA: Diagnosis not present

## 2015-03-28 DIAGNOSIS — I890 Lymphedema, not elsewhere classified: Secondary | ICD-10-CM | POA: Diagnosis not present

## 2015-03-28 DIAGNOSIS — E46 Unspecified protein-calorie malnutrition: Secondary | ICD-10-CM | POA: Diagnosis not present

## 2015-03-28 DIAGNOSIS — I482 Chronic atrial fibrillation: Secondary | ICD-10-CM | POA: Diagnosis not present

## 2015-03-28 DIAGNOSIS — J9611 Chronic respiratory failure with hypoxia: Secondary | ICD-10-CM | POA: Diagnosis not present

## 2015-03-28 DIAGNOSIS — L97822 Non-pressure chronic ulcer of other part of left lower leg with fat layer exposed: Secondary | ICD-10-CM | POA: Diagnosis not present

## 2015-03-28 DIAGNOSIS — D649 Anemia, unspecified: Secondary | ICD-10-CM | POA: Diagnosis not present

## 2015-03-28 DIAGNOSIS — Z9981 Dependence on supplemental oxygen: Secondary | ICD-10-CM | POA: Diagnosis not present

## 2015-03-28 DIAGNOSIS — I252 Old myocardial infarction: Secondary | ICD-10-CM | POA: Diagnosis not present

## 2015-03-28 DIAGNOSIS — Z7901 Long term (current) use of anticoagulants: Secondary | ICD-10-CM | POA: Diagnosis not present

## 2015-03-28 DIAGNOSIS — I272 Other secondary pulmonary hypertension: Secondary | ICD-10-CM | POA: Diagnosis not present

## 2015-04-09 ENCOUNTER — Encounter: Payer: Self-pay | Admitting: Family Medicine

## 2015-04-24 DIAGNOSIS — J45909 Unspecified asthma, uncomplicated: Secondary | ICD-10-CM | POA: Diagnosis not present

## 2015-04-24 DIAGNOSIS — J449 Chronic obstructive pulmonary disease, unspecified: Secondary | ICD-10-CM | POA: Diagnosis not present

## 2015-04-30 ENCOUNTER — Ambulatory Visit (INDEPENDENT_AMBULATORY_CARE_PROVIDER_SITE_OTHER): Payer: PPO | Admitting: Family Medicine

## 2015-04-30 ENCOUNTER — Encounter: Payer: Self-pay | Admitting: Family Medicine

## 2015-04-30 VITALS — BP 128/66 | HR 62 | Temp 98.0°F | Ht 59.0 in | Wt 147.8 lb

## 2015-04-30 DIAGNOSIS — I83013 Varicose veins of right lower extremity with ulcer of ankle: Secondary | ICD-10-CM

## 2015-04-30 DIAGNOSIS — L97319 Non-pressure chronic ulcer of right ankle with unspecified severity: Principal | ICD-10-CM

## 2015-04-30 DIAGNOSIS — I872 Venous insufficiency (chronic) (peripheral): Secondary | ICD-10-CM

## 2015-04-30 DIAGNOSIS — I83029 Varicose veins of left lower extremity with ulcer of unspecified site: Secondary | ICD-10-CM

## 2015-04-30 DIAGNOSIS — I83019 Varicose veins of right lower extremity with ulcer of unspecified site: Secondary | ICD-10-CM | POA: Diagnosis not present

## 2015-04-30 DIAGNOSIS — L97929 Non-pressure chronic ulcer of unspecified part of left lower leg with unspecified severity: Secondary | ICD-10-CM

## 2015-04-30 DIAGNOSIS — I739 Peripheral vascular disease, unspecified: Secondary | ICD-10-CM

## 2015-04-30 DIAGNOSIS — I8312 Varicose veins of left lower extremity with inflammation: Secondary | ICD-10-CM | POA: Diagnosis not present

## 2015-04-30 DIAGNOSIS — I8311 Varicose veins of right lower extremity with inflammation: Secondary | ICD-10-CM

## 2015-04-30 DIAGNOSIS — L97919 Non-pressure chronic ulcer of unspecified part of right lower leg with unspecified severity: Secondary | ICD-10-CM

## 2015-04-30 MED ORDER — TENDERWRAP UNNA BOOT BANDAGE EX MISC: CUTANEOUS | Status: DC

## 2015-04-30 NOTE — Progress Notes (Signed)
Subjective:  Patient ID: MARNAE CHILTON, female    DOB: Dec 23, 1933  Age: 82 y.o. MRN: [id number]  CC: Venous Stasis Ulcer   HPI MEGEN SHECKLER presents for Using xeroform and guaze for several weeks, butnew sores forming on both legs. Mild edemaof legs. Trying to elevate most of the time. legs.    History Kersti has a past medical history of Permanent atrial fibrillation ([place]); Complete heart block ([place]); Diastolic dysfunction; Venous insufficiency; Obesity; Pulmonary hypertension ([place]); Chronic lung disease; Debilitated; Hypertension; Sleep apnea; Rheumatoid arthritis(714.0); and Stroke ([place]).   She has past surgical history that includes Pacemaker insertion (08/13/10).   Her family history is not on file.She reports that she has never smoked. She has never used smokeless tobacco. She reports that she does not drink alcohol or use illicit drugs.    ROS Review of Systems  Constitutional: Negative for fever, activity change and appetite change.  HENT: Negative for congestion, rhinorrhea and sore throat.   Eyes: Negative for visual disturbance.  Respiratory: Negative for cough and shortness of breath.   Cardiovascular: Positive for leg swelling. Negative for chest pain and palpitations.  Gastrointestinal: Negative for nausea, abdominal pain and diarrhea.  Genitourinary: Negative for dysuria.  Musculoskeletal: Negative for myalgias and arthralgias.  Skin: Positive for wound (see HPI).    Objective:  BP 128/66 mmHg  Pulse 62  Temp(Src) 98 F (36.7 C) (Oral)  Ht 4\' 11"  (1.499 m)  Wt 147 lb 12.8 oz (67.042 kg)  BMI 29.84 kg/m2  SpO2 96%  BP Readings from Last 3 Encounters:  04/30/15 128/66  03/19/15 110/60  03/06/15 114/60    Wt Readings from Last 3 Encounters:  04/30/15 147 lb 12.8 oz (67.042 kg)  03/19/15 143 lb 6.4 oz (65.046 kg)  03/06/15 143 lb 12.8 oz (65.227 kg)     Physical Exam  Constitutional: She appears well-developed and well-nourished. No distress.   HENT:  Head: Normocephalic and atraumatic.  Eyes: Conjunctivae are normal. Pupils are equal, round, and reactive to light.  Neck: Normal range of motion. Neck supple.  Cardiovascular: Normal rate, regular rhythm and normal heart sounds.   No murmur heard. Pulmonary/Chest: Effort normal and breath sounds normal. No respiratory distress. She has no wheezes. She has no rales.  Abdominal: Soft. Bowel sounds are normal. She exhibits no distension. There is no tenderness.  Musculoskeletal: Normal range of motion. She exhibits edema.  Neurological: She is alert.  Skin: Skin is warm and dry.  There were 2 decubiti measuring 1 cm each grade 2-3 on the right lower extremity 1 at the lower shin the other at the ankle. Neither has erythema but there is violaceous change from the toes to the knees of the skin of both lower extremities. There is a 6 mm decubitus at the mid leg on the left. This is somewhat medial of midline. There is excoriation and skin breakdown widespread.  Psychiatric: She has a normal mood and affect. Her behavior is normal. Judgment and thought content normal.     Lab Results  Component Value Date   WBC 7.9 02/20/2015   HGB 9.2* 06/01/2014   HCT 37.4 02/20/2015   PLT 325 02/20/2015   GLUCOSE 93 02/20/2015   CHOL 203* 10/11/2014   TRIG 93 10/11/2014   HDL 70 10/11/2014   LDLCALC 114* 10/11/2014   ALT 8 02/20/2015   AST 17 02/20/2015   NA 144 02/20/2015   K 3.8 02/20/2015   CL 96 02/20/2015   CREATININE 1.17*  02/20/2015   BUN 18 02/20/2015   CO2 31* 02/20/2015   TSH 3.800 06/01/2014   INR 1.26 08/12/2010    Ct Abdomen Pelvis W Wo Contrast  03/07/2015  CLINICAL DATA:  Diarrhea for 2-3 weeks EXAM: CT ABDOMEN AND PELVIS WITHOUT AND WITH CONTRAST TECHNIQUE: Multidetector CT imaging of the abdomen and pelvis was performed following the standard protocol before and following the bolus administration of intravenous contrast. CONTRAST:  174mL OMNIPAQUE IOHEXOL 300 MG/ML  SOLN  COMPARISON:  None FINDINGS: Lower chest: No pleural fluid identified. The lung bases are clear. Hepatobiliary: No focal liver abnormality. The gallbladder is normal. No biliary dilatation. Pancreas: Normal appearance of the pancreas. Spleen: Negative Adrenals/Urinary Tract: The right adrenal gland is normal. Low-attenuation nodule in the left adrenal gland measures -7 Hounsfield units compatible with a benign adenoma. Bilateral renal calculi noted. The largest is in the upper pole of left kidney measuring 5 mm. No hydronephrosis or hydroureter. The urinary bladder appears normal. Stomach/Bowel: The stomach is unremarkable. The small bowel loops have a normal course and caliber. No obstruction. Normal appearance of the colon. No evidence for colitis. Specifically there is no bowel wall thickening or inflammation. Vascular/Lymphatic: Calcified atherosclerotic disease involves the abdominal aorta. No aneurysm. No enlarged retroperitoneal or mesenteric adenopathy. No enlarged pelvic or inguinal lymph nodes. Reproductive: The uterus and the adnexal structures are unremarkable for patient's age. There is a right external iliac lymph node which measures 9 mm, image 50 of series 5. Other: No free fluid or fluid collections within the abdomen or pelvis. Musculoskeletal: No aggressive lytic or sclerotic bone lesions. The bones appear osteopenic. There is degenerative disc disease identified at the L5-S1 level. A mild anterolisthesis of L5 on S1 noted. IMPRESSION: 1. No acute findings identified within the abdomen or pelvis. 2. Bilateral kidney stones. 3. Aortic atherosclerosis 4. Left adrenal adenoma. 5. Lumbar spondylosis. Electronically Signed   By: Kerby Moors M.D.   On: 03/07/2015 16:21    Assessment & Plan:   Ezinne was seen today for venous stasis ulcer.  Diagnoses and all orders for this visit:  Stasis ulcer of ankle, right ([place]) -     Apply unna boot  Stasis ulcer of lower extremity, right (HCC) -      Apply unna boot  Stasis ulcer of lower extremity, left (HCC) -     Apply unna boot  Stasis dermatitis of both legs -     Apply unna boot  Peripheral vascular disease ([place]) -     Apply unna boot  Other orders -     Wound Dressings (TENDERWRAP UNNA BOOT BANDAGE) MISC; Apply to each lower extremity weekly      I am having Ms. Rawlins start on Stella. I am also having her maintain her OXYGEN-HELIUM IN, ferrous sulfate, multivitamin with minerals, traMADol, levothyroxine, losartan, donepezil, potassium chloride, furosemide, simvastatin, and ELIQUIS.  Meds ordered this encounter  Medications  . Wound Dressings (TENDERWRAP UNNA BOOT BANDAGE) MISC    Sig: Apply to each lower extremity weekly    Dispense:  8 each    Refill:  5     Follow-up: Return in about 1 month (around 05/30/2015).  [name], M.D.

## 2015-05-04 ENCOUNTER — Telehealth: Payer: Self-pay | Admitting: Family Medicine

## 2015-05-07 MED ORDER — TENDERWRAP UNNA BOOT BANDAGE EX MISC: CUTANEOUS | Status: DC

## 2015-05-07 NOTE — Telephone Encounter (Signed)
Patients daughter aware that rx has been sent to [facility].

## 2015-05-14 ENCOUNTER — Telehealth: Payer: Self-pay | Admitting: Cardiology

## 2015-05-14 ENCOUNTER — Ambulatory Visit (INDEPENDENT_AMBULATORY_CARE_PROVIDER_SITE_OTHER): Payer: PPO | Admitting: *Deleted

## 2015-05-14 DIAGNOSIS — I442 Atrioventricular block, complete: Secondary | ICD-10-CM | POA: Diagnosis not present

## 2015-05-14 NOTE — Telephone Encounter (Signed)
Pt has felt fatigued since Saturday (05-12-15). Pt also has had some loose stool today. Pt daughter is going to send a remote transmission today. Please call back w/ the results.

## 2015-05-14 NOTE — Telephone Encounter (Signed)
Spoke w/ pt granddaughter. Pt home monitor was not working correctly. Informed pt grand daughter that she will need to call tech services to get help trouble shooting pt home monitor. Called pt daughter and informed her of this. Informed her that if they have not spoke w/ anyone by tomorrow morning to call the office to schedule an appt to come in and have device interrogated. Pt daughter verbalized understanding.

## 2015-05-15 NOTE — Telephone Encounter (Signed)
Transmission reviewed. No episodes. Normal device function. Histogram appropriate. Spoke with Ms. [name]- let her know of results.

## 2015-05-16 NOTE — Progress Notes (Signed)
Remote pacemaker transmission.   

## 2015-05-25 DIAGNOSIS — J449 Chronic obstructive pulmonary disease, unspecified: Secondary | ICD-10-CM | POA: Diagnosis not present

## 2015-05-28 ENCOUNTER — Telehealth: Payer: Self-pay | Admitting: Family Medicine

## 2015-05-28 NOTE — Telephone Encounter (Signed)
Thanks! Will take care of it now. WS 

## 2015-05-28 NOTE — Telephone Encounter (Signed)
Patient's daughter called stating that patient has had bowel incontinence (Diarrhea) since Friday.   Informed daughter to keep patient well hydrated.  Patient's daughter stated that she is going to try to giver her some immodium to see if that helps.  Daughter states that BP and pulse are a little low.  Informed daughter that we could see patient sooner.  Daughter states that she will watch patient today and if she gets any worse she will call back

## 2015-05-29 ENCOUNTER — Encounter: Payer: Self-pay | Admitting: Family Medicine

## 2015-05-29 ENCOUNTER — Ambulatory Visit (INDEPENDENT_AMBULATORY_CARE_PROVIDER_SITE_OTHER): Payer: PPO | Admitting: Family Medicine

## 2015-05-29 VITALS — BP 110/59 | HR 63 | Temp 98.4°F | Ht 59.0 in | Wt 146.0 lb

## 2015-05-29 DIAGNOSIS — I890 Lymphedema, not elsewhere classified: Secondary | ICD-10-CM

## 2015-05-29 DIAGNOSIS — I8312 Varicose veins of left lower extremity with inflammation: Secondary | ICD-10-CM | POA: Diagnosis not present

## 2015-05-29 DIAGNOSIS — I4821 Permanent atrial fibrillation: Secondary | ICD-10-CM

## 2015-05-29 DIAGNOSIS — I8311 Varicose veins of right lower extremity with inflammation: Secondary | ICD-10-CM | POA: Diagnosis not present

## 2015-05-29 DIAGNOSIS — L97901 Non-pressure chronic ulcer of unspecified part of unspecified lower leg limited to breakdown of skin: Secondary | ICD-10-CM | POA: Diagnosis not present

## 2015-05-29 DIAGNOSIS — I10 Essential (primary) hypertension: Secondary | ICD-10-CM

## 2015-05-29 DIAGNOSIS — E039 Hypothyroidism, unspecified: Secondary | ICD-10-CM | POA: Diagnosis not present

## 2015-05-29 DIAGNOSIS — I482 Chronic atrial fibrillation: Secondary | ICD-10-CM | POA: Diagnosis not present

## 2015-05-29 DIAGNOSIS — I872 Venous insufficiency (chronic) (peripheral): Secondary | ICD-10-CM

## 2015-05-29 NOTE — Progress Notes (Signed)
Subjective:  Patient ID: [name], female    DOB: 09/19/1933  Age: 82 y.o. MRN: [id number]  CC: No chief complaint on file.   HPI [name] presents for acute recurrence of diarrhea 2 days ago. Better with immodium. Not eating much. Very lttle water intake. Weak for 3 days from the waist down states her daughter. Cried out yesterday from leg pain. Continues unna boots.   Dementia stable. Remembers grand daughter getting married soon. Recalls her name. Oriented to place, person.    Patient in for follow-up of atrial fibrillation. Patient denies any recent bouts of chest pain or palpitations. Additionally, patient is taking anticoagulants. Patient denies any recent excessive bleeding episodes including epistaxis, bleeding from the gums, genitalia, rectal bleeding or hematuria. Additionally there has been no excessive bruising.   History [name] has a past medical history of Permanent atrial fibrillation ([place]); Complete heart block ([place]); Diastolic dysfunction; Venous insufficiency; Obesity; Pulmonary hypertension ([place]); Chronic lung disease; Debilitated; Hypertension; Sleep apnea; Rheumatoid arthritis(714.0); and Stroke ([place]).   She has past surgical history that includes Pacemaker insertion (08/13/10).   Her family history is not on file.She reports that she has never smoked. She has never used smokeless tobacco. She reports that she does not drink alcohol or use illicit drugs.    ROS Review of Systems  Constitutional: Positive for appetite change (not eating much) and fatigue. Negative for fever and activity change.  HENT: Negative for congestion, rhinorrhea and sore throat.   Eyes: Negative for visual disturbance.  Respiratory: Negative for cough and shortness of breath.   Cardiovascular: Negative for chest pain and palpitations.  Gastrointestinal: Positive for diarrhea. Negative for abdominal pain and constipation.  Genitourinary: Negative for dysuria.    Musculoskeletal: Negative for myalgias and arthralgias.  Neurological: Positive for weakness.  Psychiatric/Behavioral: Positive for confusion and decreased concentration. Negative for behavioral problems, sleep disturbance, dysphoric mood and agitation. The patient is not nervous/anxious.     Objective:  BP 110/59 mmHg  Pulse 63  Temp(Src) 98.4 F (36.9 C) (Oral)  Ht 4' 11"  (1.499 m)  Wt 146 lb (66.225 kg)  BMI 29.47 kg/m2  SpO2 88%  BP Readings from Last 3 Encounters:  05/29/15 110/59  04/30/15 128/66  03/19/15 110/60    Wt Readings from Last 3 Encounters:  05/29/15 146 lb (66.225 kg)  04/30/15 147 lb 12.8 oz (67.042 kg)  03/19/15 143 lb 6.4 oz (65.046 kg)     Physical Exam  Constitutional: She appears well-developed and well-nourished. No distress.  HENT:  Head: Normocephalic and atraumatic.  Right Ear: External ear normal.  Left Ear: External ear normal.  Nose: Nose normal.  Mouth/Throat: Oropharynx is clear and moist.  Eyes: Conjunctivae and EOM are normal. Pupils are equal, round, and reactive to light.  Neck: Normal range of motion. Neck supple. No thyromegaly present.  Cardiovascular: Normal rate, regular rhythm and normal heart sounds.   No murmur heard. Pulmonary/Chest: Effort normal and breath sounds normal. No respiratory distress. She has no wheezes. She has no rales.  Abdominal: Soft. Bowel sounds are normal. She exhibits no distension. There is no tenderness.  Lymphadenopathy:    She has no cervical adenopathy.  Neurological: She is alert. She has normal reflexes. No cranial nerve deficit. She exhibits abnormal muscle tone (weak). Coordination abnormal.  Skin: Skin is warm and dry. There is erythema (violaceous erythema from mid caalf to toes. several small decubiti - improving).  Psychiatric: She has a normal mood and affect.  Lab Results  Component Value Date   WBC 7.9 02/20/2015   HGB 9.2* 06/01/2014   HCT 37.4 02/20/2015   PLT 325  02/20/2015   GLUCOSE 93 02/20/2015   CHOL 203* 10/11/2014   TRIG 93 10/11/2014   HDL 70 10/11/2014   LDLCALC 114* 10/11/2014   ALT 8 02/20/2015   AST 17 02/20/2015   NA 144 02/20/2015   K 3.8 02/20/2015   CL 96 02/20/2015   CREATININE 1.17* 02/20/2015   BUN 18 02/20/2015   CO2 31* 02/20/2015   TSH 3.800 06/01/2014   INR 1.26 08/12/2010    Ct Abdomen Pelvis W Wo Contrast  03/07/2015  CLINICAL DATA:  Diarrhea for 2-3 weeks EXAM: CT ABDOMEN AND PELVIS WITHOUT AND WITH CONTRAST  IMPRESSION: 1. No acute findings identified within the abdomen or pelvis. 2. Bilateral kidney stones. 3. Aortic atherosclerosis 4. Left adrenal adenoma. 5. Lumbar spondylosis. Electronically Signed   By: Kerby Moors M.D.   On: 03/07/2015 16:21    Assessment & Plan:   Diagnoses and all orders for this visit:  Chronic ulcer of leg, unspecified laterality, limited to breakdown of skin (HCC) -     CBC with Differential/Platelet -     CMP14+EGFR  Essential hypertension -     CBC with Differential/Platelet -     CMP14+EGFR -     Urinalysis  Lymphedema of both lower extremities -     CBC with Differential/Platelet -     CMP14+EGFR  Permanent atrial fibrillation (HCC) -     CBC with Differential/Platelet -     CMP14+EGFR  Stasis dermatitis of both legs -     CBC with Differential/Platelet -     CMP14+EGFR  Hypothyroidism, unspecified hypothyroidism type -     TSH + free T4      I am having Ms. Coburn maintain her OXYGEN-HELIUM IN, ferrous sulfate, multivitamin with minerals, traMADol, levothyroxine, losartan, donepezil, potassium chloride, furosemide, simvastatin, ELIQUIS, and TENDERWRAP UNNA BOOT BANDAGE.  No orders of the defined types were placed in this encounter.     Follow-up: Return in about 8 days (around 06/06/2015) for weakness, diarrhea.  [name], M.D.

## 2015-05-30 ENCOUNTER — Telehealth: Payer: Self-pay | Admitting: Family Medicine

## 2015-05-30 DIAGNOSIS — J9611 Chronic respiratory failure with hypoxia: Secondary | ICD-10-CM

## 2015-05-30 NOTE — Telephone Encounter (Signed)
Pt is going out of town and needs portable O2 RX written and faxed to number [phone] per pt request Okayed per Dr Livia Snellen

## 2015-06-01 ENCOUNTER — Ambulatory Visit: Payer: PPO | Admitting: Family Medicine

## 2015-06-04 ENCOUNTER — Encounter: Payer: Self-pay | Admitting: Family Medicine

## 2015-06-04 DIAGNOSIS — R531 Weakness: Secondary | ICD-10-CM

## 2015-06-04 DIAGNOSIS — F05 Delirium due to known physiological condition: Secondary | ICD-10-CM

## 2015-06-04 DIAGNOSIS — M79661 Pain in right lower leg: Secondary | ICD-10-CM

## 2015-06-04 DIAGNOSIS — M79662 Pain in left lower leg: Secondary | ICD-10-CM

## 2015-06-04 NOTE — Telephone Encounter (Signed)
Please order MRI brain to r/o stroke

## 2015-06-06 ENCOUNTER — Telehealth: Payer: Self-pay | Admitting: Family Medicine

## 2015-06-06 NOTE — Telephone Encounter (Signed)
Lab is checking on results

## 2015-06-07 ENCOUNTER — Ambulatory Visit (INDEPENDENT_AMBULATORY_CARE_PROVIDER_SITE_OTHER): Payer: PPO | Admitting: Family Medicine

## 2015-06-07 ENCOUNTER — Telehealth: Payer: Self-pay | Admitting: Family Medicine

## 2015-06-07 ENCOUNTER — Encounter: Payer: Self-pay | Admitting: Family Medicine

## 2015-06-07 ENCOUNTER — Ambulatory Visit: Payer: PPO

## 2015-06-07 ENCOUNTER — Ambulatory Visit (INDEPENDENT_AMBULATORY_CARE_PROVIDER_SITE_OTHER): Payer: PPO

## 2015-06-07 ENCOUNTER — Other Ambulatory Visit: Payer: Self-pay

## 2015-06-07 ENCOUNTER — Other Ambulatory Visit: Payer: Self-pay | Admitting: Family Medicine

## 2015-06-07 VITALS — BP 137/77 | HR 84 | Temp 97.4°F | Ht 59.0 in | Wt 140.0 lb

## 2015-06-07 DIAGNOSIS — M79641 Pain in right hand: Secondary | ICD-10-CM | POA: Diagnosis not present

## 2015-06-07 DIAGNOSIS — W19XXXA Unspecified fall, initial encounter: Secondary | ICD-10-CM

## 2015-06-07 DIAGNOSIS — R531 Weakness: Secondary | ICD-10-CM

## 2015-06-07 DIAGNOSIS — R410 Disorientation, unspecified: Secondary | ICD-10-CM

## 2015-06-07 DIAGNOSIS — M79662 Pain in left lower leg: Secondary | ICD-10-CM

## 2015-06-07 DIAGNOSIS — R059 Cough, unspecified: Secondary | ICD-10-CM

## 2015-06-07 DIAGNOSIS — R05 Cough: Secondary | ICD-10-CM

## 2015-06-07 DIAGNOSIS — F05 Delirium due to known physiological condition: Principal | ICD-10-CM

## 2015-06-07 DIAGNOSIS — Z780 Asymptomatic menopausal state: Secondary | ICD-10-CM

## 2015-06-07 DIAGNOSIS — M79661 Pain in right lower leg: Secondary | ICD-10-CM

## 2015-06-07 LAB — URINALYSIS, COMPLETE
BILIRUBIN UA: NEGATIVE
Glucose, UA: NEGATIVE
KETONES UA: NEGATIVE
Nitrite, UA: NEGATIVE
PH UA: 6 (ref 5.0–7.5)
PROTEIN UA: NEGATIVE
SPEC GRAV UA: 1.01 (ref 1.005–1.030)
UUROB: 0.2 mg/dL (ref 0.2–1.0)

## 2015-06-07 LAB — MICROSCOPIC EXAMINATION

## 2015-06-07 MED ORDER — CEFDINIR 300 MG PO CAPS: 300.0000 mg | ORAL_CAPSULE | Freq: Two times a day (BID) | ORAL | Status: DC

## 2015-06-07 NOTE — Telephone Encounter (Signed)
Patients daughter aware

## 2015-06-07 NOTE — Progress Notes (Signed)
Subjective:    Patient ID: [name], female    DOB: 12/19/1933, 82 y.o.   MRN: [id number]  HPI Pt here for frequent recent falls and cough. She complains today of right hand pain. She has had 3 falls in the last 24 hours. She is accompanied today by her daughter. With the cough she has had no fever or she'll's. Cough sounds productive. O2 sat on room air was 83% here in the office. She does wear oxygen continuously. More frequent falls are noted. She does not really complain of her hand today in the office but it is blue. There is no to minimal swelling.      Patient Active Problem List   Diagnosis Date Noted  . Dementia of the Alzheimer's type without behavioral disturbance 10/11/2014  . Essential hypertension 10/11/2014  . Cardiomyopathy, dilated ([place]) 07/24/2014  . Apnea, sleep 07/24/2014  . Anemia 04/28/2014  . Chronic hypoxemic respiratory failure ([place]) 04/12/2014  . Chronic ulcer of leg ([place]) 04/12/2014  . Lymphedema of leg 04/12/2014  . Disorder of nutrition 04/12/2014  . Hypothyroidism 03/24/2014  . Stasis dermatitis of both legs 05/06/2013  . Pacemaker-St.Jude 11/25/2011  . Complete heart block ([place]) 12/12/2010  . Permanent atrial fibrillation ([place]) 12/12/2010  . Diastolic dysfunction 27/07/8673  . Venous insufficiency 12/12/2010  . Long term current use of anticoagulant 09/30/2002   Outpatient Encounter Prescriptions as of 06/07/2015  Medication Sig  . donepezil (ARICEPT) 10 MG tablet TAKE 1 TABLET (10 MG TOTAL) BY MOUTH AT BEDTIME. (Patient taking differently: pti is taking 5 mg qd)  . ELIQUIS 5 MG TABS tablet TAKE ONE TABLET BY MOUTH TWICE DAILY  . ferrous sulfate 325 (65 FE) MG EC tablet Take 1 tablet (325 mg total) by mouth 3 (three) times daily with meals.  . furosemide (LASIX) 40 MG tablet One at breakfast and 1/2 at lunch  . levothyroxine (SYNTHROID, LEVOTHROID) 50 MCG tablet TAKE ONE TABLET BY MOUTH ONE TIME DAILY  . losartan (COZAAR) 100 MG tablet  Take 1 tablet (100 mg total) by mouth daily.  . Multiple Vitamins-Minerals (MULTIVITAMIN WITH MINERALS) tablet Take 1 tablet by mouth daily.  Donell Sievert IN Inhale into the lungs continuous.  . potassium chloride SA (K-DUR,KLOR-CON) 10 MEQ tablet Take 1 tablet (10 mEq total) by mouth daily.  . simvastatin (ZOCOR) 20 MG tablet TAKE ONE (1) TABLET EACH DAY  . traMADol (ULTRAM) 50 MG tablet Take 1 tablet (50 mg total) by mouth 3 (three) times daily as needed for moderate pain.  . Wound Dressings (TENDERWRAP UNNA BOOT BANDAGE) MISC Apply to each lower extremity weekly   No facility-administered encounter medications on file as of 06/07/2015.     Review of Systems  Constitutional: Negative.   HENT: Positive for congestion.   Eyes: Negative.   Respiratory: Positive for cough.   Cardiovascular: Negative.   Gastrointestinal: Negative.   Endocrine: Negative.   Genitourinary: Negative.   Musculoskeletal: Positive for arthralgias (right hand pain).  Skin: Negative.   Allergic/Immunologic: Negative.   Neurological: Negative.   Hematological: Negative.   Psychiatric/Behavioral: Negative.        Objective:   Physical Exam  Constitutional: She appears well-developed.  Patient is somnolent in the office. Question function of hypoxia versus CO2 retention  Pulmonary/Chest: Effort normal. She has rales.  Rales on right greater than left chest x-ray shows infiltrate in the right side  Neurological:  As noted above she is somnolent. She recognizes her daughter is not oriented to  time or place   BP 137/77 mmHg  Pulse 84  Temp(Src) 97.4 F (36.3 C) (Oral)  Ht 4' 11"  (1.499 m)  Wt 140 lb (63.504 kg)  BMI 28.26 kg/m2  SpO2 83%        Assessment & Plan:  1. Falls, initial encounter Possibly related to infection. Will check potassium hemoglobin thyroid to see if there are other contributing factors to her falling - DG Chest 2 View; Future - DG Hand Complete Right; Future - Thyroid  Panel With TSH - CBC with Differential/Platelet  2. Right hand pain There is a fracture in the proximal phalanx of the third finger but there are calcifications in this appears old. Exam does not suggest acute injury - DG Hand Complete Right; Future - CBC with Differential/Platelet  3. Cough Chest x-ray and exam consistent with pneumonia. Treat with Omnicef 300 mg twice a day  - DG Chest 2 View; Future - CMP14+EGFR - Thyroid Panel With TSH - CBC with Differential/Platelet  Wardell Honour MD

## 2015-06-08 ENCOUNTER — Ambulatory Visit: Payer: PPO | Admitting: Family Medicine

## 2015-06-08 LAB — CMP14+EGFR
A/G RATIO: 1 — AB (ref 1.2–2.2)
ALT: 8 IU/L (ref 0–32)
AST: 15 IU/L (ref 0–40)
Albumin: 3.5 g/dL (ref 3.5–4.7)
Alkaline Phosphatase: 90 IU/L (ref 39–117)
BUN/Creatinine Ratio: 27 (ref 12–28)
BUN: 24 mg/dL (ref 8–27)
Bilirubin Total: 0.7 mg/dL (ref 0.0–1.2)
CALCIUM: 9.1 mg/dL (ref 8.7–10.3)
CO2: 32 mmol/L — ABNORMAL HIGH (ref 18–29)
Chloride: 97 mmol/L (ref 96–106)
Creatinine, Ser: 0.9 mg/dL (ref 0.57–1.00)
GFR calc Af Amer: 70 mL/min/{1.73_m2} (ref >59)
GFR, EST NON AFRICAN AMERICAN: 61 mL/min/{1.73_m2} (ref >59)
GLUCOSE: 107 mg/dL — AB (ref 65–99)
Globulin, Total: 3.5 g/dL (ref 1.5–4.5)
Potassium: 3.5 mmol/L (ref 3.5–5.2)
SODIUM: 147 mmol/L — AB (ref 134–144)
TOTAL PROTEIN: 7 g/dL (ref 6.0–8.5)

## 2015-06-08 LAB — CBC WITH DIFFERENTIAL/PLATELET
BASOS ABS: 0 10*3/uL (ref 0.0–0.2)
Basos: 0 %
EOS (ABSOLUTE): 0 10*3/uL (ref 0.0–0.4)
Eos: 0 %
Hematocrit: 34.2 % (ref 34.0–46.6)
Hemoglobin: 10.6 g/dL — ABNORMAL LOW (ref 11.1–15.9)
IMMATURE GRANULOCYTES: 1 %
Immature Grans (Abs): 0.2 10*3/uL — ABNORMAL HIGH (ref 0.0–0.1)
LYMPHS ABS: 1.5 10*3/uL (ref 0.7–3.1)
Lymphs: 12 %
MCH: 28.4 pg (ref 26.6–33.0)
MCHC: 31 g/dL — AB (ref 31.5–35.7)
MCV: 92 fL (ref 79–97)
MONOS ABS: 0.9 10*3/uL (ref 0.1–0.9)
Monocytes: 7 %
NEUTROS PCT: 80 %
Neutrophils Absolute: 10.2 10*3/uL — ABNORMAL HIGH (ref 1.4–7.0)
PLATELETS: 378 10*3/uL (ref 150–379)
RBC: 3.73 x10E6/uL — AB (ref 3.77–5.28)
RDW: 14.2 % (ref 12.3–15.4)
WBC: 12.8 10*3/uL — AB (ref 3.4–10.8)

## 2015-06-08 LAB — THYROID PANEL WITH TSH
FREE THYROXINE INDEX: 2.1 (ref 1.2–4.9)
T3 Uptake Ratio: 31 % (ref 24–39)
T4 TOTAL: 6.7 ug/dL (ref 4.5–12.0)
TSH: 1.54 u[IU]/mL (ref 0.450–4.500)

## 2015-06-14 ENCOUNTER — Ambulatory Visit (HOSPITAL_COMMUNITY): Payer: PPO

## 2015-06-15 ENCOUNTER — Ambulatory Visit: Payer: PPO | Admitting: Family Medicine

## 2015-06-18 ENCOUNTER — Ambulatory Visit: Payer: PPO | Admitting: Family Medicine

## 2015-06-19 ENCOUNTER — Ambulatory Visit (INDEPENDENT_AMBULATORY_CARE_PROVIDER_SITE_OTHER): Payer: PPO

## 2015-06-19 ENCOUNTER — Encounter: Payer: Self-pay | Admitting: Family Medicine

## 2015-06-19 ENCOUNTER — Ambulatory Visit (INDEPENDENT_AMBULATORY_CARE_PROVIDER_SITE_OTHER): Payer: PPO | Admitting: Family Medicine

## 2015-06-19 VITALS — BP 126/71 | HR 89 | Temp 97.2°F | Ht 59.0 in | Wt 145.6 lb

## 2015-06-19 DIAGNOSIS — S62602D Fracture of unspecified phalanx of right middle finger, subsequent encounter for fracture with routine healing: Secondary | ICD-10-CM

## 2015-06-19 DIAGNOSIS — F0281 Dementia in other diseases classified elsewhere with behavioral disturbance: Secondary | ICD-10-CM

## 2015-06-19 DIAGNOSIS — R197 Diarrhea, unspecified: Secondary | ICD-10-CM

## 2015-06-19 DIAGNOSIS — J9611 Chronic respiratory failure with hypoxia: Secondary | ICD-10-CM

## 2015-06-19 DIAGNOSIS — G309 Alzheimer's disease, unspecified: Secondary | ICD-10-CM

## 2015-06-19 DIAGNOSIS — Z950 Presence of cardiac pacemaker: Secondary | ICD-10-CM

## 2015-06-19 DIAGNOSIS — J189 Pneumonia, unspecified organism: Secondary | ICD-10-CM

## 2015-06-19 DIAGNOSIS — I8312 Varicose veins of left lower extremity with inflammation: Secondary | ICD-10-CM | POA: Diagnosis not present

## 2015-06-19 DIAGNOSIS — I10 Essential (primary) hypertension: Secondary | ICD-10-CM | POA: Diagnosis not present

## 2015-06-19 DIAGNOSIS — I8311 Varicose veins of right lower extremity with inflammation: Secondary | ICD-10-CM | POA: Diagnosis not present

## 2015-06-19 DIAGNOSIS — I872 Venous insufficiency (chronic) (peripheral): Secondary | ICD-10-CM

## 2015-06-19 DIAGNOSIS — I482 Chronic atrial fibrillation: Secondary | ICD-10-CM

## 2015-06-19 DIAGNOSIS — I890 Lymphedema, not elsewhere classified: Secondary | ICD-10-CM

## 2015-06-19 DIAGNOSIS — G308 Other Alzheimer's disease: Secondary | ICD-10-CM

## 2015-06-19 DIAGNOSIS — I4821 Permanent atrial fibrillation: Secondary | ICD-10-CM

## 2015-06-19 MED ORDER — MEMANTINE HCL ER 7 & 14 & 21 &28 MG PO CP24: ORAL_CAPSULE | ORAL | Status: DC

## 2015-06-19 NOTE — Progress Notes (Signed)
Subjective:  Patient ID: [name], female    DOB: 1933-10-21  Age: 82 y.o. MRN: [id number]  CC: Pneumonia; Diarrhea; and Venous Stasis Ulcer   HPI [name] presents for Worsening behavioral status. The patient has become abusive of her caretakers and screaming and yelling at them as well screaming for help as if being attacked when they are just trying to give care to her. She screams so loud the next door neighbor, a son-in-law has expressed concern. She's also been caught on at least 2 occasions smearing her fecal matter on the bathroom walls and behind the sink etc. in the home. Her daughter gives this history. The patient denies it.  She was diagnosed with pneumonia 2 weeks ago. She was seen at urgent care. She has finished taking Omnicef. She still has some cough but otherwise is near normal.   She continues to have diarrhea multiple loose bowel movements daily.She is eating well, but will eat only  sweets and soda if they are available.  The family is doing wound care at home. She has been using Unna boots weekly. The wounds are not painful, and are wrapped snuggly so that the compression has reduced her edema.  She has afracture of theRight third finger. This was diagnosed by x-ray 2 weeks ago at the ER. She has not complained of pain. She tends to favor it a bit but not noticeably.   Patient in for follow-up of atrial fibrillation. Patient and her daughter deny any recent bouts of chest pain or palpitations. Additionally, patient is taking anticoagulant. Patient and daughter deny any recent excessive bleeding episodes including epistaxis, bleeding from the gums, genitalia, rectal bleeding or hematuria. Additionally there has been no excessive bruising.  History [name] has a past medical history of Permanent atrial fibrillation ([place]); Complete heart block ([place]); Diastolic dysfunction; Venous insufficiency; Obesity; Pulmonary hypertension ([place]); Chronic lung disease;  Debilitated; Hypertension; Sleep apnea; Rheumatoid arthritis(714.0); and Stroke ([place]).   She has past surgical history that includes Pacemaker insertion (08/13/10).   Her family history is not on file.She reports that she has never smoked. She has never used smokeless tobacco. She reports that she does not drink alcohol or use illicit drugs.    ROS Review of Systems  Constitutional: Negative for fever, activity change and appetite change.  HENT: Negative for congestion, rhinorrhea and sore throat.   Eyes: Negative for visual disturbance.  Respiratory: Negative for cough and shortness of breath.   Cardiovascular: Negative for chest pain and palpitations.  Gastrointestinal: Negative for nausea, abdominal pain and diarrhea.  Genitourinary: Negative for dysuria.  Musculoskeletal: Negative for myalgias and arthralgias.  Skin: Positive for color change (violaceous from the tibial tuberosity to the toes bilaterally).  Neurological: Negative for seizures, syncope, speech difficulty and weakness.  Psychiatric/Behavioral: Positive for behavioral problems, confusion, dysphoric mood and agitation. Negative for suicidal ideas, sleep disturbance and self-injury. The patient is nervous/anxious.     Objective:  BP 126/71 mmHg  Pulse 89  Temp(Src) 97.2 F (36.2 C) (Oral)  Ht 4\' 11"  (1.499 m)  Wt 145 lb 9.6 oz (66.044 kg)  BMI 29.39 kg/m2  SpO2 95%  BP Readings from Last 3 Encounters:  06/19/15 126/71  06/07/15 137/77  05/29/15 110/59    Wt Readings from Last 3 Encounters:  06/19/15 145 lb 9.6 oz (66.044 kg)  06/07/15 140 lb (63.504 kg)  05/29/15 146 lb (66.225 kg)     Physical Exam  Constitutional: She is oriented to person, place,  and time. She appears well-developed and well-nourished. No distress.  HENT:  Head: Normocephalic and atraumatic.  Right Ear: External ear normal.  Left Ear: External ear normal.  Nose: Nose normal.  Mouth/Throat: Oropharynx is clear and moist.  Eyes:  Conjunctivae and EOM are normal. Pupils are equal, round, and reactive to light.  Neck: Normal range of motion. Neck supple. No thyromegaly present.  Cardiovascular: Normal rate, regular rhythm and normal heart sounds.   No murmur heard. Pulmonary/Chest: Effort normal and breath sounds normal. No respiratory distress. She has no wheezes. She has no rales.  Abdominal: Soft. Bowel sounds are normal. She exhibits no distension. There is no tenderness.  Lymphadenopathy:    She has no cervical adenopathy.  Neurological: She is alert and oriented to person, place, and time. She has normal reflexes.  Skin: Skin is warm and dry.  There is violaceous discoloration of the legs bilaterally from the tibial tuberosity to the toes. There is slightly decreased capillary refill. Pulses are diminished to 1+ at the dorsalis pedis bilaterally. There is some maceration of the skin but no frank skin breakdown at this point. At the superior aspect of the bandage there is slight edema accumulated around the top of the bandage on the right leg again at the tibial tuberosity.  Psychiatric: Her affect is angry and inappropriate. Her speech is rapid and/or pressured and tangential. She is agitated and aggressive. Thought content is paranoid and delusional. Cognition and memory are impaired. She expresses inappropriate judgment. She exhibits abnormal recent memory.     Lab Results  Component Value Date   WBC 12.8* 06/07/2015   HGB 9.2* 06/01/2014   HCT 34.2 06/07/2015   PLT 378 06/07/2015   GLUCOSE 107* 06/07/2015   CHOL 203* 10/11/2014   TRIG 93 10/11/2014   HDL 70 10/11/2014   LDLCALC 114* 10/11/2014   ALT 8 06/07/2015   AST 15 06/07/2015   NA 147* 06/07/2015   K 3.5 06/07/2015   CL 97 06/07/2015   CREATININE 0.90 06/07/2015   BUN 24 06/07/2015   CO2 32* 06/07/2015   TSH 1.540 06/07/2015   INR 1.26 08/12/2010    Ct Abdomen Pelvis W Wo Contrast  03/07/2015  CLINICAL DATA:  Diarrhea for 2-3 weeks EXAM: CT  ABDOMEN AND PELVIS WITHOUT AND WITH CONTRAST  IMPRESSION: 1. No acute findings identified within the abdomen or pelvis. 2. Bilateral kidney stones. 3. Aortic atherosclerosis 4. Left adrenal adenoma. 5. Lumbar spondylosis.   Assessment & Plan:   [name] was seen today for pneumonia, diarrhea and venous stasis ulcer.  Diagnoses and all orders for this visit:  Chronic hypoxemic respiratory failure (HCC)  Alzheimer's dementia with behavioral disturbance  Permanent atrial fibrillation (HCC)  Pacemaker-St.Jude  Lymphedema of both lower extremities  Essential hypertension  Stasis dermatitis of both legs  Pneumonia, unspecified laterality, unspecified part of lung -     DG Chest 2 View; Future  Fracture of phalanx of right middle finger with routine healing -     DG Hand Complete Right; Future  Diarrhea, unspecified type -     Cdiff NAA+O+P+Stool Culture  Other orders -     Memantine HCl ER 7 & 14 & 21 &28 MG CP24; Follow label directions  Chest x-ray shows interval clearing with minimal residual infiltrate.  Hand x-ray shows fracture of the proximal phalanx of the third right digit. There is mild palmar displacement and mild callus formation.   I am having Ms. Cassatt start on Memantine HCl ER.  I am also having her maintain her OXYGEN-HELIUM IN, ferrous sulfate, multivitamin with minerals, traMADol, levothyroxine, losartan, donepezil, potassium chloride, furosemide, simvastatin, ELIQUIS, TENDERWRAP UNNA BOOT BANDAGE, and cefdinir.  Meds ordered this encounter  Medications  . Memantine HCl ER 7 & 14 & 21 &28 MG CP24    Sig: Follow label directions    Dispense:  28 capsule    Refill:  0   Unna boot supplied by the nurse here. Family to change weekly at home. The daughter and I discussed in detail her rolling caretaking. She has been much less assertive than necessary allowing her mother to basically bully her into doing what she wants. I believe the daughter is beginning to  understand that she has to do what is best for her mother even if her mother objects or becomes hostile. This obviously relates to unsanitary behaviors as well as eating habits and abusive speech.  Follow-up: Return in about 2 weeks (around 07/03/2015).  [name], M.D.

## 2015-06-21 ENCOUNTER — Encounter: Payer: Self-pay | Admitting: Cardiology

## 2015-06-21 LAB — CUP PACEART REMOTE DEVICE CHECK
Battery Voltage: 2.98 V
Brady Statistic RV Percent Paced: 89 %
Date Time Interrogation Session: 20170418120731
Implantable Lead Implant Date: 20120717
Implantable Lead Model: 1948
Lead Channel Impedance Value: 640 Ohm
Lead Channel Setting Pacing Amplitude: 2.5 V
Lead Channel Setting Pacing Pulse Width: 0.4 ms
MDC IDC LEAD LOCATION: 753860
MDC IDC MSMT BATTERY REMAINING LONGEVITY: 137 mo
MDC IDC MSMT BATTERY REMAINING PERCENTAGE: 95.5 %
MDC IDC MSMT LEADCHNL RV SENSING INTR AMPL: 6.7 mV
MDC IDC PG SERIAL: 7245638
MDC IDC SET LEADCHNL RV SENSING SENSITIVITY: 2.5 mV

## 2015-06-24 DIAGNOSIS — J449 Chronic obstructive pulmonary disease, unspecified: Secondary | ICD-10-CM | POA: Diagnosis not present

## 2015-06-27 ENCOUNTER — Other Ambulatory Visit: Payer: Self-pay | Admitting: Family Medicine

## 2015-07-04 ENCOUNTER — Other Ambulatory Visit: Payer: PPO

## 2015-07-04 ENCOUNTER — Ambulatory Visit: Payer: PPO | Admitting: Family Medicine

## 2015-07-04 DIAGNOSIS — R197 Diarrhea, unspecified: Secondary | ICD-10-CM | POA: Diagnosis not present

## 2015-07-06 ENCOUNTER — Encounter: Payer: Self-pay | Admitting: Family Medicine

## 2015-07-06 ENCOUNTER — Ambulatory Visit (INDEPENDENT_AMBULATORY_CARE_PROVIDER_SITE_OTHER): Payer: PPO | Admitting: Family Medicine

## 2015-07-06 ENCOUNTER — Telehealth: Payer: Self-pay | Admitting: Family Medicine

## 2015-07-06 VITALS — BP 125/71 | HR 78 | Temp 97.1°F | Ht 59.0 in | Wt 150.8 lb

## 2015-07-06 DIAGNOSIS — G301 Alzheimer's disease with late onset: Secondary | ICD-10-CM | POA: Diagnosis not present

## 2015-07-06 DIAGNOSIS — I872 Venous insufficiency (chronic) (peripheral): Secondary | ICD-10-CM

## 2015-07-06 DIAGNOSIS — Z950 Presence of cardiac pacemaker: Secondary | ICD-10-CM | POA: Diagnosis not present

## 2015-07-06 DIAGNOSIS — I482 Chronic atrial fibrillation: Secondary | ICD-10-CM

## 2015-07-06 DIAGNOSIS — L97911 Non-pressure chronic ulcer of unspecified part of right lower leg limited to breakdown of skin: Secondary | ICD-10-CM

## 2015-07-06 DIAGNOSIS — I8312 Varicose veins of left lower extremity with inflammation: Secondary | ICD-10-CM

## 2015-07-06 DIAGNOSIS — I890 Lymphedema, not elsewhere classified: Secondary | ICD-10-CM | POA: Diagnosis not present

## 2015-07-06 DIAGNOSIS — F0281 Dementia in other diseases classified elsewhere with behavioral disturbance: Secondary | ICD-10-CM

## 2015-07-06 DIAGNOSIS — I8311 Varicose veins of right lower extremity with inflammation: Secondary | ICD-10-CM

## 2015-07-06 DIAGNOSIS — I4821 Permanent atrial fibrillation: Secondary | ICD-10-CM

## 2015-07-06 DIAGNOSIS — F02818 Dementia in other diseases classified elsewhere, unspecified severity, with other behavioral disturbance: Secondary | ICD-10-CM

## 2015-07-06 MED ORDER — AMOXICILLIN-POT CLAVULANATE 875-125 MG PO TABS: 1.0000 | ORAL_TABLET | Freq: Two times a day (BID) | ORAL | Status: DC

## 2015-07-06 NOTE — Progress Notes (Signed)
Subjective:  Patient ID: [name], female    DOB: 01-Nov-1933  Age: 82 y.o. MRN: [id number]  CC: Venous Stasis Ulcer   HPI [name] presents for Worsening behavioral status.It has stabilized since starting on the memantine. this has not lead to any improvement yet. She tolerates the medication well. The patient has become abusive of her caretakers and screaming and yelling at them . Patient again denies yelling. However during the evaluation she became quite adamant that she was not eating too many cookies. She took this to the level of angrily yelling at her daughter who had been giving the history. They are limiting her to 4 cookies a day and then only after she eats appropriate meals.  .  The family is doing wound care at home. She has been using Unna boots weekly. The wounds are not painful, and are wrapped snuggly so that the compression has reduced her edema. However, the pharmacy ran out of [employer] and applied a different dressing a few days ago.  Patient in for follow-up of atrial fibrillation. Patient and her daughter deny any recent bouts of chest pain or palpitations. Additionally, patient is taking anticoagulant. Patient and daughter deny any recent excessive bleeding episodes including epistaxis, bleeding from the gums, genitalia, rectal bleeding or hematuria. Additionally there has been no excessive bruising.  History [name] has a past medical history of Permanent atrial fibrillation ([place]); Complete heart block ([place]); Diastolic dysfunction; Venous insufficiency; Obesity; Pulmonary hypertension ([place]); Chronic lung disease; Debilitated; Hypertension; Sleep apnea; Rheumatoid arthritis(714.0); and Stroke ([place]).   She has past surgical history that includes Pacemaker insertion (08/13/10).   Her family history is not on file.She reports that she has never smoked. She has never used smokeless tobacco. She reports that she does not drink alcohol or use illicit  drugs.    ROS Review of Systems  Constitutional: Negative for fever, activity change and appetite change.  HENT: Negative for congestion, rhinorrhea and sore throat.   Eyes: Negative for visual disturbance.  Respiratory: Negative for cough and shortness of breath.   Cardiovascular: Negative for chest pain and palpitations.  Gastrointestinal: Negative for nausea, abdominal pain and diarrhea.  Genitourinary: Negative for dysuria.  Musculoskeletal: Negative for myalgias and arthralgias.  Skin: Positive for color change (violaceous from the tibial tuberosity to the toes bilaterally).  Neurological: Negative for seizures, syncope, speech difficulty and weakness.  Psychiatric/Behavioral: Positive for behavioral problems, confusion, dysphoric mood and agitation. Negative for suicidal ideas, sleep disturbance and self-injury. The patient is nervous/anxious.     Objective:  BP 125/71 mmHg  Pulse 78  Temp(Src) 97.1 F (36.2 C) (Oral)  Ht 4\' 11"  (1.499 m)  Wt 150 lb 12.8 oz (68.402 kg)  BMI 30.44 kg/m2  SpO2 94%  BP Readings from Last 3 Encounters:  07/06/15 125/71  06/19/15 126/71  06/07/15 137/77    Wt Readings from Last 3 Encounters:  07/06/15 150 lb 12.8 oz (68.402 kg)  06/19/15 145 lb 9.6 oz (66.044 kg)  06/07/15 140 lb (63.504 kg)     Physical Exam  Constitutional: She is oriented to person, place, and time. She appears well-developed and well-nourished. No distress.  HENT:  Head: Normocephalic and atraumatic.  Right Ear: External ear normal.  Left Ear: External ear normal.  Nose: Nose normal.  Mouth/Throat: Oropharynx is clear and moist.  Eyes: Conjunctivae and EOM are normal. Pupils are equal, round, and reactive to light.  Neck: Normal range of motion. Neck supple. No thyromegaly  present.  Cardiovascular: Normal rate, regular rhythm and normal heart sounds.   No murmur heard. Pulmonary/Chest: Effort normal and breath sounds normal. No respiratory distress. She has  no wheezes. She has no rales.  Abdominal: Soft. Bowel sounds are normal. She exhibits no distension. There is no tenderness.  Lymphadenopathy:    She has no cervical adenopathy.  Neurological: She is alert and oriented to person, place, and time. She has normal reflexes.  Skin: Skin is warm and dry.  There is violaceous discoloration of the legs bilaterally from the tibial tuberosity to the toes. There is slightly decreased capillary refill. Pulses are diminished to 1+ at the dorsalis pedis bilaterally. There is  frank skin breakdown at  The right upper leg. At the superior aspect of the bandage there is slight edema accumulated around the top of the bandage on the right leg again at the tibial tuberosity.  Psychiatric: Her affect is angry and inappropriate. Her speech is rapid and/or pressured and tangential. She is agitated and aggressive. Thought content is paranoid and delusional. Cognition and memory are impaired. She expresses inappropriate judgment. She exhibits abnormal recent memory.     Lab Results  Component Value Date   WBC 12.8* 06/07/2015   HGB 9.2* 06/01/2014   HCT 34.2 06/07/2015   PLT 378 06/07/2015   GLUCOSE 107* 06/07/2015   CHOL 203* 10/11/2014   TRIG 93 10/11/2014   HDL 70 10/11/2014   LDLCALC 114* 10/11/2014   ALT 8 06/07/2015   AST 15 06/07/2015   NA 147* 06/07/2015   K 3.5 06/07/2015   CL 97 06/07/2015   CREATININE 0.90 06/07/2015   BUN 24 06/07/2015   CO2 32* 06/07/2015   TSH 1.540 06/07/2015   INR 1.26 08/12/2010    Ct Abdomen Pelvis W Wo Contrast  03/07/2015  CLINICAL DATA:  Diarrhea for 2-3 weeks EXAM: CT ABDOMEN AND PELVIS WITHOUT AND WITH CONTRAST  IMPRESSION: 1. No acute findings identified within the abdomen or pelvis. 2. Bilateral kidney stones. 3. Aortic atherosclerosis 4. Left adrenal adenoma. 5. Lumbar spondylosis.   Assessment & Plan:   [name] was seen today for venous stasis ulcer.  Diagnoses and all orders for this visit:  Chronic ulcer  of leg, right, limited to breakdown of skin (HCC) -     AMB referral to wound care center  Stasis dermatitis of both legs  Pacemaker-St.Jude  Permanent atrial fibrillation (HCC)  Lymphedema of both lower extremities  Late onset Alzheimer's disease with behavioral disturbance  Other orders -     amoxicillin-clavulanate (AUGMENTIN) 875-125 MG tablet; Take 1 tablet by mouth 2 (two) times daily. Take all of this medication  Chest x-ray shows interval clearing with minimal residual infiltrate.  Hand x-ray shows fracture of the proximal phalanx of the third right digit. There is mild palmar displacement and mild callus formation.   I have discontinued [name]'s cefdinir. I am also having her start on amoxicillin-clavulanate. Additionally, I am having her maintain her OXYGEN-HELIUM IN, ferrous sulfate, multivitamin with minerals, traMADol, levothyroxine, losartan, potassium chloride, furosemide, simvastatin, ELIQUIS, TENDERWRAP UNNA BOOT BANDAGE, Memantine HCl ER, and donepezil.  Meds ordered this encounter  Medications  . amoxicillin-clavulanate (AUGMENTIN) 875-125 MG tablet    Sig: Take 1 tablet by mouth 2 (two) times daily. Take all of this medication    Dispense:  20 tablet    Refill:  0   Unna boot supplied by the nurse here. Family to change weekly at home.  Daughter has been muchmore  assertive than in the past. I believe the daughter is beginning to understand that she has to do what is best for her mother even if her mother objects or becomes hostile.  Follow-up: Return in about 1 week (around 07/13/2015).  [name], M.D.

## 2015-07-06 NOTE — Telephone Encounter (Signed)
There is no [facility] wound center

## 2015-07-09 ENCOUNTER — Encounter: Payer: Self-pay | Admitting: Cardiology

## 2015-07-10 LAB — CDIFF NAA+O+P+STOOL CULTURE
E COLI SHIGA TOXIN ASSAY: NEGATIVE
Toxigenic C. Difficile by PCR: NEGATIVE

## 2015-07-13 ENCOUNTER — Telehealth: Payer: Self-pay | Admitting: Family Medicine

## 2015-07-13 ENCOUNTER — Other Ambulatory Visit: Payer: Self-pay | Admitting: Family Medicine

## 2015-07-13 MED ORDER — MEMANTINE HCL ER 28 MG PO CP24: 28.0000 mg | ORAL_CAPSULE | Freq: Every day | ORAL | Status: DC

## 2015-07-13 NOTE — Telephone Encounter (Signed)
Covering for PCP on his day off.   Rx written for Namenda XR 28 mg daily, R [place], MD [facility] 07/13/2015, 4:54 PM

## 2015-07-16 NOTE — Telephone Encounter (Signed)
Detailed message left for patient.

## 2015-07-17 ENCOUNTER — Ambulatory Visit: Payer: PPO | Admitting: Family Medicine

## 2015-07-19 ENCOUNTER — Encounter: Payer: Self-pay | Admitting: Family Medicine

## 2015-07-19 ENCOUNTER — Ambulatory Visit (INDEPENDENT_AMBULATORY_CARE_PROVIDER_SITE_OTHER): Payer: PPO | Admitting: Family Medicine

## 2015-07-19 VITALS — BP 142/69 | HR 87 | Temp 98.3°F | Ht 59.0 in | Wt 147.4 lb

## 2015-07-19 DIAGNOSIS — L97911 Non-pressure chronic ulcer of unspecified part of right lower leg limited to breakdown of skin: Secondary | ICD-10-CM | POA: Diagnosis not present

## 2015-07-19 NOTE — Progress Notes (Signed)
Subjective:    Patient ID: [name], female    DOB: July 11, 1933, 82 y.o.   MRN: [id number]  HPI 82 year old female with chronic leg ulcers and stasis dermatitis on both legs complicated by lymphedema. She has been treated for the past several weeks with Unna boots and pressure dressings. The daughter who accompanies her today tells me that her legs look much better once we remove the dressings. Patient has some dementia and her reliability on how they look is not very reliable. She does have a follow-up appointment next week at the wound center.  Patient Active Problem List   Diagnosis Date Noted  . Alzheimer's disease 10/11/2014  . Essential hypertension 10/11/2014  . Cardiomyopathy, dilated ([place]) 07/24/2014  . Apnea, sleep 07/24/2014  . Anemia 04/28/2014  . Chronic hypoxemic respiratory failure ([place]) 04/12/2014  . Chronic ulcer of leg ([place]) 04/12/2014  . Lymphedema of leg 04/12/2014  . Disorder of nutrition 04/12/2014  . Hypothyroidism 03/24/2014  . Stasis dermatitis of both legs 05/06/2013  . Pacemaker-St.Jude 11/25/2011  . Complete heart block ([place]) 12/12/2010  . Permanent atrial fibrillation ([place]) 12/12/2010  . Diastolic dysfunction [id number]  . Venous insufficiency 12/12/2010  . Long term current use of anticoagulant 09/30/2002   Outpatient Encounter Prescriptions as of 07/19/2015  Medication Sig  . donepezil (ARICEPT) 10 MG tablet TAKE ONE TABLET DAILY AT BEDTIME  . ELIQUIS 5 MG TABS tablet TAKE ONE TABLET BY MOUTH TWICE DAILY  . ferrous sulfate 325 (65 FE) MG EC tablet Take 1 tablet (325 mg total) by mouth 3 (three) times daily with meals.  . furosemide (LASIX) 40 MG tablet One at breakfast and 1/2 at lunch  . levothyroxine (SYNTHROID, LEVOTHROID) 50 MCG tablet TAKE ONE TABLET BY MOUTH ONE TIME DAILY  . losartan (COZAAR) 100 MG tablet Take 1 tablet (100 mg total) by mouth daily.  . memantine (NAMENDA XR) 28 MG CP24 24 hr capsule Take 1 capsule (28 mg total) by  mouth daily.  . Multiple Vitamins-Minerals (MULTIVITAMIN WITH MINERALS) tablet Take 1 tablet by mouth daily.  Donell Sievert IN Inhale into the lungs continuous.  . potassium chloride SA (K-DUR,KLOR-CON) 10 MEQ tablet Take 1 tablet (10 mEq total) by mouth daily.  . simvastatin (ZOCOR) 20 MG tablet TAKE ONE (1) TABLET EACH DAY  . traMADol (ULTRAM) 50 MG tablet Take 1 tablet (50 mg total) by mouth 3 (three) times daily as needed for moderate pain.  . [DISCONTINUED] Wound Dressings (TENDERWRAP UNNA BOOT BANDAGE) MISC Apply to each lower extremity weekly  . [DISCONTINUED] amoxicillin-clavulanate (AUGMENTIN) 875-125 MG tablet Take 1 tablet by mouth 2 (two) times daily. Take all of this medication   No facility-administered encounter medications on file as of 07/19/2015.      Review of Systems  Constitutional: Negative.   Cardiovascular: Positive for leg swelling.  Skin: Positive for wound (bilateral leg).       Objective:   Physical Exam  Constitutional: She appears well-developed and well-nourished.  Skin:  Legs have changes of chronic stasis dermatitis. There is only 1+ swelling today after the Unna boots were removed. The left leg is almost totally healed except for one small superficial area the right leg is a little bit worse but wounds are superficial and a grade 1-2.   BP 142/69 mmHg  Pulse 87  Temp(Src) 98.3 F (36.8 C) (Oral)  Ht 4\' 11"  (1.499 m)  Wt 147 lb 6.4 oz (66.86 kg)  BMI 29.76 kg/m2  SpO2 87%  Assessment & Plan:  1. Chronic ulcer of leg, right, limited to breakdown of skin (HCC) Wounds are healing nicely with current treatment of Unna boots. I think she had some Xeroform gauze under the Unna boots that was removed. Would continue same treatment until she is seen at wound center. Leave dressings in pla  Wardell Honour MD

## 2015-07-25 DIAGNOSIS — J449 Chronic obstructive pulmonary disease, unspecified: Secondary | ICD-10-CM | POA: Diagnosis not present

## 2015-08-02 ENCOUNTER — Other Ambulatory Visit: Payer: Self-pay | Admitting: Family Medicine

## 2015-08-02 ENCOUNTER — Encounter (HOSPITAL_BASED_OUTPATIENT_CLINIC_OR_DEPARTMENT_OTHER): Payer: PPO | Attending: Internal Medicine

## 2015-08-13 ENCOUNTER — Other Ambulatory Visit: Payer: Self-pay | Admitting: Family Medicine

## 2015-08-24 ENCOUNTER — Encounter: Payer: Self-pay | Admitting: Internal Medicine

## 2015-08-24 ENCOUNTER — Ambulatory Visit (INDEPENDENT_AMBULATORY_CARE_PROVIDER_SITE_OTHER): Payer: PPO | Admitting: Internal Medicine

## 2015-08-24 VITALS — BP 122/80 | HR 94 | Ht 59.0 in | Wt 138.0 lb

## 2015-08-24 DIAGNOSIS — I4821 Permanent atrial fibrillation: Secondary | ICD-10-CM

## 2015-08-24 DIAGNOSIS — I872 Venous insufficiency (chronic) (peripheral): Secondary | ICD-10-CM

## 2015-08-24 DIAGNOSIS — I482 Chronic atrial fibrillation: Secondary | ICD-10-CM | POA: Diagnosis not present

## 2015-08-24 DIAGNOSIS — I959 Hypotension, unspecified: Secondary | ICD-10-CM

## 2015-08-24 DIAGNOSIS — I442 Atrioventricular block, complete: Secondary | ICD-10-CM

## 2015-08-24 DIAGNOSIS — Z950 Presence of cardiac pacemaker: Secondary | ICD-10-CM

## 2015-08-24 DIAGNOSIS — J449 Chronic obstructive pulmonary disease, unspecified: Secondary | ICD-10-CM | POA: Diagnosis not present

## 2015-08-24 LAB — CUP PACEART INCLINIC DEVICE CHECK
Battery Remaining Longevity: 126
Battery Voltage: 2.96 V
Brady Statistic RV Percent Paced: 81 %
Date Time Interrogation Session: 20170728133534
Implantable Lead Implant Date: 20120717
Implantable Lead Location: 753860
Lead Channel Pacing Threshold Amplitude: 0.75 V
Lead Channel Setting Pacing Amplitude: 2.5 V
Lead Channel Setting Pacing Pulse Width: 0.4 ms
Lead Channel Setting Sensing Sensitivity: 2.5 mV
MDC IDC LEAD MODEL: 1948
MDC IDC MSMT LEADCHNL RV IMPEDANCE VALUE: 662.5 Ohm
MDC IDC MSMT LEADCHNL RV PACING THRESHOLD PULSEWIDTH: 0.4 ms
MDC IDC MSMT LEADCHNL RV SENSING INTR AMPL: 6.1 mV
MDC IDC PG SERIAL: 7245638
Pulse Gen Model: 1210

## 2015-08-24 NOTE — Patient Instructions (Addendum)
Medication Instructions:   Your physician recommends that you continue on your current medications as directed. Please refer to the Current Medication list given to you today.  Labwork: NONE  Testing/Procedures:  Device check from home on 11/26/15.  Follow-Up: Your physician recommends that you schedule a follow-up appointment in: 1 year with Dr. Rayann Heman. Please schedule this appointment today before leaving the office.  Any Other Special Instructions Will Be Listed Below (If Applicable).  If you need a refill on your cardiac medications before your next appointment, please call your pharmacy.

## 2015-08-24 NOTE — Progress Notes (Signed)
Electrophysiology Office Note   Date:  08/24/2015   ID:  [name], [name] 1933/02/09, MRN [id number]  PCP:  [name], MD   Primary Electrophysiologist: Thompson Grayer, MD    CC: SOB   History of Present Illness: [name] is a 82 y.o. female who presents today for electrophysiology evaluation.   She continues to decline.  Her SOB has worsened over the past year.  She is now on 4L O2 daily.  Her hypotension has resolved.  Edema is stable.  Today, she denies symptoms of palpitations, chest pain,  claudication, dizziness, presyncope, syncope, or neurologic sequela. The patient is tolerating medications without difficulties and is otherwise without complaint today.    Past Medical History:  Diagnosis Date  . Chronic lung disease    on home O2   . Complete heart block (HCC)    s/p PPM by Dr Rayann Heman  . Debilitated   . Diastolic dysfunction   . Hypertension   . Obesity   . Permanent atrial fibrillation ([place])   . Pulmonary hypertension ([place])   . Rheumatoid arthritis(714.0)   . Sleep apnea   . Stroke [facility])    remote  . Venous insufficiency    with chronic leg ulcers   Past Surgical History:  Procedure Laterality Date  . PACEMAKER INSERTION  08/13/10   SJM by Dr Rayann Heman     Current Outpatient Prescriptions  Medication Sig Dispense Refill  . donepezil (ARICEPT) 10 MG tablet TAKE ONE TABLET DAILY AT BEDTIME 90 tablet 1  . ELIQUIS 5 MG TABS tablet TAKE ONE TABLET BY MOUTH TWICE DAILY 180 tablet 1  . ferrous sulfate 325 (65 FE) MG EC tablet Take 1 tablet (325 mg total) by mouth 3 (three) times daily with meals. 30 tablet 3  . furosemide (LASIX) 40 MG tablet One at breakfast and 1/2 at lunch 45 tablet 2  . levothyroxine (SYNTHROID, LEVOTHROID) 50 MCG tablet TAKE ONE (1) TABLET EACH DAY 90 tablet 2  . losartan (COZAAR) 100 MG tablet Take 1 tablet (100 mg total) by mouth daily. 90 tablet 0  . Multiple Vitamins-Minerals (MULTIVITAMIN WITH MINERALS) tablet Take 1  tablet by mouth daily.    Marland Kitchen NAMENDA XR 28 MG CP24 24 hr capsule TAKE ONE (1) CAPSULE EACH DAY 30 capsule 4  . OXYGEN-HELIUM IN Inhale into the lungs continuous. 4L    . potassium chloride SA (K-DUR,KLOR-CON) 10 MEQ tablet Take 1 tablet (10 mEq total) by mouth daily. 30 tablet 0  . simvastatin (ZOCOR) 20 MG tablet TAKE ONE (1) TABLET EACH DAY 30 tablet 0  . traMADol (ULTRAM) 50 MG tablet Take 1 tablet (50 mg total) by mouth 3 (three) times daily as needed for moderate pain. 60 tablet 3   No current facility-administered medications for this visit.     Allergies:   Review of patient's allergies indicates no known allergies.   Social History:  The patient  reports that she has never smoked. She has never used smokeless tobacco. She reports that she does not drink alcohol or use drugs.   Family History:  The patients father had CHF.   ROS:  Please see the history of present illness.   All other systems are reviewed and negative.    PHYSICAL EXAM: VS:  BP 122/80   Pulse 94   Ht 4\' 11"  (1.499 m)   Wt 138 lb (62.6 kg)   SpO2 98%   BMI 27.87 kg/m  , BMI Body mass index is 27.87  kg/m. GEN: ill appearing, in no acute distress  HEENT: conjunctiva is pale, moist MM Neck: + JVD, carotid bruits, or masses Cardiac: iRRR; 2/6 SEM LSB,  +1 chronic edema with legs wrapped today Respiratory:  clear to auscultation bilaterally, normal work of breathing GI: soft, nontender, nondistended, + BS MS: diffuse muscle atrophy  Skin: sallow appearing Neuro:  Strength and sensation are intact Psych: euthymic mood, full affect   Echo 2016 reviewed with patient and daughter today  Recent remote Device interrogation is reviewed today in detail.  See PaceArt for details.   Recent Labs: 02/20/2015: BNP 106.6 06/07/2015: ALT 8; BUN 24; Creatinine, Ser 0.90; Platelets 378; Potassium 3.5; Sodium 147; TSH 1.540    Lipid Panel     Component Value Date/Time   CHOL 203 (H) 10/11/2014 1547   TRIG 93  10/11/2014 1547   HDL 70 10/11/2014 1547   CHOLHDL 2.9 10/11/2014 1547   LDLCALC 114 (H) 10/11/2014 1547     Wt Readings from Last 3 Encounters:  08/24/15 138 lb (62.6 kg)  07/19/15 147 lb 6.4 oz (66.9 kg)  07/06/15 150 lb 12.8 oz (68.4 kg)      Other studies Reviewed: Additional studies/ records that were reviewed today include: recent labs in epic   ASSESSMENT AND PLAN:  1.  Hypotension resolved  2. Permanent atrial fibrillation chads2vasc score is at least 5 Continue eliquis long term given prior stroke 5/17 labs reviewed  3. Symptomatic complete heart block Normal pacemaker function See Pace Art report No changes today  4. Venous insufficiency Leg compression 2 gram sodium restriction Given severe TR/ pulmonary hypertension, doubt we can fix this long term Would be cautious with lasix given prior hypotension  5. Moderate MR, pulmonary hypertension, moderate to severe TR Followed by Dr Harl Bowie Conservative management  Today we discussed code status She currently wishes to be full code  [place] Return to see Dr Harl Bowie as scheduled I will see again in 1 year  Current medicines are reviewed at length with the patient today.   The patient does not have concerns regarding her medicines.  The following changes were made today:  none  Signed, Thompson Grayer, MD  08/24/2015 10:13 AM     [facility] [address] [place] [address] [phone] (office) [phone] (fax)

## 2015-08-30 ENCOUNTER — Encounter: Payer: Self-pay | Admitting: Internal Medicine

## 2015-09-10 ENCOUNTER — Other Ambulatory Visit: Payer: Self-pay | Admitting: *Deleted

## 2015-09-10 ENCOUNTER — Telehealth: Payer: Self-pay | Admitting: Family Medicine

## 2015-09-10 DIAGNOSIS — L97901 Non-pressure chronic ulcer of unspecified part of unspecified lower leg limited to breakdown of skin: Secondary | ICD-10-CM

## 2015-09-10 NOTE — Telephone Encounter (Signed)
Referral Placed 

## 2015-09-10 NOTE — Telephone Encounter (Signed)
test

## 2015-09-10 NOTE — Telephone Encounter (Signed)
Please refer as requested 

## 2015-09-10 NOTE — Telephone Encounter (Signed)
Does patient need referral?

## 2015-09-19 ENCOUNTER — Encounter: Payer: Self-pay | Admitting: Family Medicine

## 2015-09-24 ENCOUNTER — Ambulatory Visit (HOSPITAL_COMMUNITY): Payer: PPO | Attending: Family Medicine | Admitting: Physical Therapy

## 2015-09-24 DIAGNOSIS — M79661 Pain in right lower leg: Secondary | ICD-10-CM

## 2015-09-24 DIAGNOSIS — M79662 Pain in left lower leg: Secondary | ICD-10-CM | POA: Diagnosis not present

## 2015-09-24 DIAGNOSIS — R2681 Unsteadiness on feet: Secondary | ICD-10-CM | POA: Diagnosis not present

## 2015-09-24 DIAGNOSIS — I83028 Varicose veins of left lower extremity with ulcer other part of lower leg: Secondary | ICD-10-CM | POA: Diagnosis not present

## 2015-09-24 DIAGNOSIS — J449 Chronic obstructive pulmonary disease, unspecified: Secondary | ICD-10-CM | POA: Diagnosis not present

## 2015-09-24 DIAGNOSIS — I83018 Varicose veins of right lower extremity with ulcer other part of lower leg: Secondary | ICD-10-CM | POA: Diagnosis not present

## 2015-09-24 DIAGNOSIS — L97829 Non-pressure chronic ulcer of other part of left lower leg with unspecified severity: Secondary | ICD-10-CM

## 2015-09-24 DIAGNOSIS — L97819 Non-pressure chronic ulcer of other part of right lower leg with unspecified severity: Secondary | ICD-10-CM

## 2015-09-24 NOTE — Therapy (Signed)
[place], [name], [address] Phone: [phone]   Fax:  [phone]  Wound Care Evaluation  Patient Details  Name: [name] MRN: [id number] Date of Birth: 1933/09/01 No Data Recorded  Encounter Date: 09/24/2015      PT End of Session - 09/24/15 1608    Visit Number 1   Number of Visits 8   Date for PT Re-Evaluation 10/24/15   Authorization Type [employer]    Authorization - Visit Number 1   Authorization - Number of Visits 8   PT Start Time 1120   PT Stop Time 1220   PT Time Calculation (min) 60 min   Activity Tolerance Patient tolerated treatment well   Behavior During Therapy [facility] for tasks assessed/performed      Past Medical History:  Diagnosis Date  . Chronic lung disease    on home O2   . Complete heart block (HCC)    s/p PPM by Dr Rayann Heman  . Debilitated   . Diastolic dysfunction   . Hypertension   . Obesity   . Permanent atrial fibrillation ([place])   . Pulmonary hypertension ([place])   . Rheumatoid arthritis(714.0)   . Sleep apnea   . Stroke [facility])    remote  . Venous insufficiency    with chronic leg ulcers    Past Surgical History:  Procedure Laterality Date  . PACEMAKER INSERTION  08/13/10   SJM by Dr Rayann Heman    There were no vitals filed for this visit.      Subjective Assessment - 09/24/15 1121    Subjective [name] states that she has had wounds on her legs for the past 15 years.  The wounds come and go her current wounds have been on her legs for the past four weeks.  Her family and MD have been taking care of her legs using unna boots but the wounds are not healing.   Pertinent History COPD with oxygen dependency (4L), HTN, RA, CVA, venous insufficency with chronic leg wounds, lymphedema.   Currently in Pain? No/denies          [facility] PT Assessment - 09/24/15 0001      Assessment   Medical Diagnosis (P)  Venous insufficiency with lymphedema and non  healing wounds.    Referring Provider Mamie Nick)  Cletus Gash Stacks   Onset Date/Surgical Date (P)  08/24/15   Prior Therapy (P)  none      Precautions   Precautions (P)  Fall     Restrictions   Weight Bearing Restrictions (P)  No     Balance Screen   Has the patient fallen in the past 6 months (P)  Yes   How many times? (P)  1   Has the patient had a decrease in activity level because of a fear of falling?  (P)  Yes   Is the patient reluctant to leave their home because of a fear of falling?  (P)  No     Prior Function   Level of Independence (P)  Independent   Leisure (P)  none      Cognition   Overall Cognitive Status (P)  Within Functional Limits for tasks assessed         Wound Therapy - 09/24/15 1429    Subjective Pt has had multiple wounds on her legs intermittently for years.    Patient and Family Stated Goals Wounds to heal once and for all.    Date  of Onset 08/24/15   Prior Treatments MD, home health, home care    Pain Assessment No/denies pain  when something touches legs pain will go up to a 6/10    Evaluation and Treatment Procedures Explained to Patient/Family Yes   Evaluation and Treatment Procedures agreed to   Wound Properties Date First Assessed: 09/24/15 Time First Assessed: 1133 Wound Type: Other (Comment) , venous stasis ulcer   Location: Leg Location Orientation: Right;Distal Wound Description (Comments): Over ten small wounds scattered around anterior medial to anterior lateral with the largest being 2 cm diameter  Present on Admission: Yes   Dressing Type Compression wrap;Impregnated gauze (bismuth)   Dressing Changed Changed   Dressing Status Old drainage   Dressing Change Frequency PRN   Site / Wound Assessment Friable;Red   % Wound base Red or Granulating 90%   % Wound base Yellow 10%   Peri-wound Assessment Hemosiderin   Wound Length (cm) --  10 small wounds throughout LE    Drainage Amount Minimal   Drainage Description Sanguineous   Treatment  Cleansed;Debridement (Selective);Other (Comment)  compression dressing with profore lite.    Wound Properties Date First Assessed: 09/24/15 Time First Assessed: 1140 Wound Type: Other (Comment) Location: Leg Location Orientation: Left;Anterior Wound Description (Comments): 4 small wounds with the largest bein 1.5 x 1.0  Present on Admission: Yes   Dressing Type --  Unna boot was on pt changed to profore lite    Dressing Changed Changed   Dressing Status Old drainage   Dressing Change Frequency PRN   Site / Wound Assessment Granulation tissue;Red;Yellow   % Wound base Red or Granulating 90%   % Wound base Yellow 10%   Peri-wound Assessment Hemosiderin   Wound Length (cm) --  multiple wounds largest being 1.5 x 1 cm    Drainage Amount Minimal   Drainage Description Sanguineous   Treatment Cleansed;Debridement (Selective);Other (Comment)  compression    Selective Debridement - Location --  multiple wound sites    Selective Debridement - Tools Used Forceps   Selective Debridement - Tissue Removed slough   Wound Therapy - Clinical Statement [name] is an 82 yo female who has chronic venous stasis ulcers which is complicated by lymphedema.  She has been using UNNA boots on both of her LE which has her edema in control at this time.  Due to her wounds not healing she has been referred to skilled physcial therapy to create a healing enviornment.  Evaluation demonstrates fragile skin bilaterally with multiple wounds on bilateral LE.; as well as pt being unsteady on her feet.   [name] will benefit from skillee physical therapy to create a healing wound envirornment and educate pt on the importance of compression everyday to prevent future wounds.  Pt will also be given a prescription to work on her balance and gait once MD returns the prescription to this clinic.  Pt lives in [place] so it would be easier for her to complete her gait and balance therapy in [place].     Wound Therapy -  Functional Problem List difficulty walking    Factors Delaying/Impairing Wound Healing Polypharmacy;Vascular compromise   Hydrotherapy Plan Debridement;Dressing change;Patient/family education   Wound Therapy - Frequency --  Twice a week x 4 weeks    Wound Therapy - Current Recommendations PT   Dressing  Profore- lite bilaterally  PT Education - 10-11-15 1607    Education provided Yes   Education Details advised to keep dressing in place unless they become painful.  If painful take one layer off at a time.    Person(s) Educated Patient   Methods Explanation   Comprehension Verbalized understanding          PT Short Term Goals - Oct 11, 2015 1612      PT SHORT TERM GOAL #1   Title Pt to verbalize the importance of using compression every day to keep legs wound free   Time 1   Period Weeks   Status New     PT SHORT TERM GOAL #2   Title Pt to state that the pain in her legs are no greater than 3/10 to allow pt to ambulate for five minutes without stopping    Time 2   Period Weeks   Status New     PT SHORT TERM GOAL #3   Title Pt to have only 5 wounds on Rt LE and 2 on Lt LE    Time 2   Period Weeks           PT Long Term Goals - 2015/10/11 1614      PT LONG TERM GOAL #1   Title Pt to be wound free to reduce risk of infection    Time 4   Period Weeks   Status New     PT LONG TERM GOAL #2   Title Pt to have obtained compression garment and to be able to don and doff with minimal assist from family    Time 4   Period Weeks   Status New              Plan - Oct 11, 2015 1609    Clinical Impression Statement see above    Rehab Potential Good   PT Frequency 2x / week   PT Duration 4 weeks   PT Treatment/Interventions ADLs/Self Care Home Management;Gait training;Stair training;Therapeutic exercise;Patient/family education;Other (comment)  debridement and multilayer compression dressing    PT Next Visit Plan continue with  debridement and compression dressing until wounds are healed.  Evaluate gt, balance and strength when prescription comes back.        Patient will benefit from skilled therapeutic intervention in order to improve the following deficits and impairments:  Pain, Increased edema, Difficulty walking, Decreased balance, Other (comment) (nonhealing wound )  Visit Diagnosis: Pain in right lower leg  Pain in left lower leg  Unsteadiness on feet  Varicose veins of right lower extremity with ulcer other part of lower leg (HCC)  Varicose veins of left lower extremity with ulcer other part of lower leg ([place])      G-Codes - Oct 11, 2015 1618    Functional Assessment Tool Used clinical jugement: number of wounds and time they have been present, ambulation speed    Functional Limitation Other PT primary   Other PT Primary Current Status [id number]) At least 60 percent but less than 80 percent impaired, limited or restricted   Other PT Primary Goal Status [id number]) At least 20 percent but less than 40 percent impaired, limited or restricted      Problem List Patient Active Problem List   Diagnosis Date Noted  . Alzheimer's disease 10/11/2014  . Essential hypertension 10/11/2014  . Cardiomyopathy, dilated ([place]) 07/24/2014  . Apnea, sleep 07/24/2014  . Anemia 04/28/2014  . Chronic hypoxemic respiratory failure ([place]) 04/12/2014  . Chronic ulcer of leg ([place]) 04/12/2014  .  Lymphedema of leg 04/12/2014  . Disorder of nutrition 04/12/2014  . Hypothyroidism 03/24/2014  . Stasis dermatitis of both legs 05/06/2013  . Pacemaker-St.Jude 11/25/2011  . Complete heart block ([place]) 12/12/2010  . Permanent atrial fibrillation ([place]) 12/12/2010  . Diastolic dysfunction [id number]  . Venous insufficiency 12/12/2010  . Long term current use of anticoagulant 09/30/2002    Rayetta Humphrey, PT CLT [phone] 09/24/2015, 4:20 PM  [place] [address] [place],  [name], [address] Phone: [phone]   Fax:  [phone]  Name: [name] MRN: [id number] Date of Birth: Nov 19, 1933

## 2015-09-27 ENCOUNTER — Ambulatory Visit (HOSPITAL_COMMUNITY): Payer: PPO | Admitting: Physical Therapy

## 2015-09-27 DIAGNOSIS — M79661 Pain in right lower leg: Secondary | ICD-10-CM

## 2015-09-27 DIAGNOSIS — I83028 Varicose veins of left lower extremity with ulcer other part of lower leg: Secondary | ICD-10-CM

## 2015-09-27 DIAGNOSIS — L97819 Non-pressure chronic ulcer of other part of right lower leg with unspecified severity: Secondary | ICD-10-CM

## 2015-09-27 DIAGNOSIS — R2681 Unsteadiness on feet: Secondary | ICD-10-CM

## 2015-09-27 DIAGNOSIS — M79662 Pain in left lower leg: Secondary | ICD-10-CM

## 2015-09-27 DIAGNOSIS — L97829 Non-pressure chronic ulcer of other part of left lower leg with unspecified severity: Secondary | ICD-10-CM

## 2015-09-27 DIAGNOSIS — I83018 Varicose veins of right lower extremity with ulcer other part of lower leg: Secondary | ICD-10-CM

## 2015-09-27 NOTE — Therapy (Signed)
[place], [name], [address] Phone: [phone]   Fax:  [phone]  Physical Therapy Treatment  Patient Details  Name: [name] MRN: [id number] Date of Birth: 1933-10-25 No Data Recorded  Encounter Date: 09/27/2015      PT End of Session - 09/27/15 1616    Visit Number 2   Number of Visits 8   Date for PT Re-Evaluation 10/24/15   Authorization Type [employer]    Authorization - Visit Number 2   Authorization - Number of Visits 8   PT Start Time [id number]   PT Stop Time 1542   PT Time Calculation (min) 64 min   Activity Tolerance Patient tolerated treatment well   Behavior During Therapy [facility] for tasks assessed/performed      Past Medical History:  Diagnosis Date  . Chronic lung disease    on home O2   . Complete heart block (HCC)    s/p PPM by Dr Rayann Heman  . Debilitated   . Diastolic dysfunction   . Hypertension   . Obesity   . Permanent atrial fibrillation ([place])   . Pulmonary hypertension ([place])   . Rheumatoid arthritis(714.0)   . Sleep apnea   . Stroke [facility])    remote  . Venous insufficiency    with chronic leg ulcers    Past Surgical History:  Procedure Laterality Date  . PACEMAKER INSERTION  08/13/10   SJM by Dr Rayann Heman    There were no vitals filed for this visit.      Subjective Assessment - 09/27/15 1610    Subjective Pt states that the bandages were comfortable.    Pertinent History COPD with oxygen dependency (4L), HTN, RA, CVA, venous insufficency with chronic leg wounds, lymphedema.   Currently in Pain? No/denies  Pain with debridement only                        Wound Therapy - 09/27/15 1611    Subjective Pt has had multiple wounds on her legs intermittently for years.    Patient and Family Stated Goals Wounds to heal once and for all.    Date of Onset 08/24/15   Prior Treatments MD, home health, home care    Pain Assessment --  with  debridement only    Evaluation and Treatment Procedures Explained to Patient/Family Yes   Evaluation and Treatment Procedures agreed to   Wound Properties Date First Assessed: 09/24/15 Time First Assessed: 1133 Wound Type: Other (Comment) , venous stasis ulcer   Location: Leg Location Orientation: Right;Distal Wound Description (Comments): Over ten small wounds scattered around anterior medial to anterior lateral with the largest being 2 cm diameter  Present on Admission: Yes   Dressing Type Compression wrap;Impregnated gauze (bismuth)   Dressing Changed Changed   Dressing Status Old drainage   Dressing Change Frequency PRN   Site / Wound Assessment Friable;Red   % Wound base Red or Granulating 90%   % Wound base Yellow 10%   Peri-wound Assessment Hemosiderin   Drainage Amount Minimal   Drainage Description Sanguineous   Treatment Cleansed;Debridement (Selective)   Wound Properties Date First Assessed: 09/24/15 Time First Assessed: 1140 Wound Type: Other (Comment) Location: Leg Location Orientation: Left;Anterior Wound Description (Comments): 4 small wounds with the largest bein 1.5 x 1.0  Present on Admission: Yes   Dressing Type Compression wrap  Unna boot was on pt changed to profore lite  Dressing Changed Changed   Dressing Status Old drainage   Dressing Change Frequency PRN   Site / Wound Assessment Granulation tissue;Red;Yellow   % Wound base Red or Granulating 90%   % Wound base Yellow 10%   Peri-wound Assessment Hemosiderin   Drainage Amount Minimal   Drainage Description Sanguineous   Treatment Cleansed;Debridement (Selective)   Selective Debridement - Location --  multiple wound sites    Selective Debridement - Tools Used Forceps   Selective Debridement - Tissue Removed slough and dry skin    Wound Therapy - Clinical Statement Pt tolerated compression dressing well.  Wounds over all appear smaller with less swelling noted bilaterally.    Wound Therapy - Functional Problem  List difficulty walking    Factors Delaying/Impairing Wound Healing Polypharmacy;Vascular compromise   Hydrotherapy Plan Debridement;Dressing change;Patient/family education   Wound Therapy - Frequency --  Twice a week x 4 weeks    Wound Therapy - Current Recommendations PT   Dressing  Profore- bilaterally    Manual Therapy decongestive therapy done using inguinal -axillary anastomosis followed by thigh bilaterally.  Manual was completed following debridement and application of profore.                     PT Short Term Goals - 09/27/15 1618      PT SHORT TERM GOAL #1   Title Pt to verbalize the importance of using compression every day to keep legs wound free   Time 1   Period Weeks   Status On-going     PT SHORT TERM GOAL #2   Title Pt to state that the pain in her legs are no greater than 3/10 to allow pt to ambulate for five minutes without stopping    Time 2   Period Weeks   Status On-going     PT SHORT TERM GOAL #3   Title Pt to have only 5 wounds on Rt LE and 2 on Lt LE    Time 2   Period Weeks   Status On-going           PT Long Term Goals - 09/27/15 1618      PT LONG TERM GOAL #1   Title Pt to be wound free to reduce risk of infection    Time 4   Period Weeks   Status On-going     PT LONG TERM GOAL #2   Title Pt to have obtained compression garment and to be able to don and doff with minimal assist from family    Time 4   Period Weeks   Status On-going               Plan - 09/27/15 1617    Clinical Impression Statement see above    Rehab Potential Good   PT Frequency 2x / week   PT Duration 4 weeks   PT Treatment/Interventions ADLs/Self Care Home Management;Gait training;Stair training;Therapeutic exercise;Patient/family education;Other (comment)  debridement and multilayer compression dressing    PT Next Visit Plan continue with debridement and compression dressing until wounds are healed.  Assess how pt tolerated profore vs  profore lite.  Evaluate gt, balance and strength when prescription comes back.        Patient will benefit from skilled therapeutic intervention in order to improve the following deficits and impairments:  Pain, Increased edema, Difficulty walking, Decreased balance, Other (comment) (nonhealing wound )  Visit Diagnosis: Pain in right lower leg  Pain in left lower leg  Unsteadiness on feet  Varicose veins of right lower extremity with ulcer other part of lower leg (HCC)  Varicose veins of left lower extremity with ulcer other part of lower leg [facility])     Problem List Patient Active Problem List   Diagnosis Date Noted  . Alzheimer's disease 10/11/2014  . Essential hypertension 10/11/2014  . Cardiomyopathy, dilated ([place]) 07/24/2014  . Apnea, sleep 07/24/2014  . Anemia 04/28/2014  . Chronic hypoxemic respiratory failure ([place]) 04/12/2014  . Chronic ulcer of leg ([place]) 04/12/2014  . Lymphedema of leg 04/12/2014  . Disorder of nutrition 04/12/2014  . Hypothyroidism 03/24/2014  . Stasis dermatitis of both legs 05/06/2013  . Pacemaker-St.Jude 11/25/2011  . Complete heart block ([place]) 12/12/2010  . Permanent atrial fibrillation ([place]) 12/12/2010  . Diastolic dysfunction [id number]  . Venous insufficiency 12/12/2010  . Long term current use of anticoagulant 09/30/2002    Rayetta Humphrey, PT CLT [phone] 09/27/2015, 4:19 PM  [place] [address] [place], [name], [address] Phone: [phone]   Fax:  [phone]  Name: [name] MRN: [id number] Date of Birth: 04/25/1933

## 2015-10-02 ENCOUNTER — Ambulatory Visit (HOSPITAL_BASED_OUTPATIENT_CLINIC_OR_DEPARTMENT_OTHER): Payer: PPO

## 2015-10-03 ENCOUNTER — Ambulatory Visit (HOSPITAL_COMMUNITY): Payer: PPO | Attending: Family Medicine | Admitting: Physical Therapy

## 2015-10-03 DIAGNOSIS — M79661 Pain in right lower leg: Secondary | ICD-10-CM | POA: Diagnosis not present

## 2015-10-03 DIAGNOSIS — I83028 Varicose veins of left lower extremity with ulcer other part of lower leg: Secondary | ICD-10-CM | POA: Diagnosis not present

## 2015-10-03 DIAGNOSIS — M79662 Pain in left lower leg: Secondary | ICD-10-CM

## 2015-10-03 DIAGNOSIS — L97819 Non-pressure chronic ulcer of other part of right lower leg with unspecified severity: Secondary | ICD-10-CM

## 2015-10-03 DIAGNOSIS — R2681 Unsteadiness on feet: Secondary | ICD-10-CM

## 2015-10-03 DIAGNOSIS — I83018 Varicose veins of right lower extremity with ulcer other part of lower leg: Secondary | ICD-10-CM

## 2015-10-03 DIAGNOSIS — L97829 Non-pressure chronic ulcer of other part of left lower leg with unspecified severity: Secondary | ICD-10-CM

## 2015-10-03 NOTE — Therapy (Signed)
[place], [name], [address] Phone: [phone]   Fax:  [phone]  Wound Care Therapy  Patient Details  Name: [name] MRN: [id number] Date of Birth: 05-Jul-1933 No Data Recorded  Encounter Date: 10/03/2015      PT End of Session - 10/03/15 1559    Visit Number 3   Number of Visits 8   Date for PT Re-Evaluation 10/24/15   Authorization Type [employer]    Authorization - Visit Number 3   Authorization - Number of Visits 8   PT Start Time [id number]   PT Stop Time 1537   PT Time Calculation (min) 59 min   Activity Tolerance Patient tolerated treatment well   Behavior During Therapy [facility] for tasks assessed/performed      Past Medical History:  Diagnosis Date  . Chronic lung disease    on home O2   . Complete heart block (HCC)    s/p PPM by Dr Rayann Heman  . Debilitated   . Diastolic dysfunction   . Hypertension   . Obesity   . Permanent atrial fibrillation ([place])   . Pulmonary hypertension ([place])   . Rheumatoid arthritis(714.0)   . Sleep apnea   . Stroke [facility])    remote  . Venous insufficiency    with chronic leg ulcers    Past Surgical History:  Procedure Laterality Date  . PACEMAKER INSERTION  08/13/10   SJM by Dr Rayann Heman    There were no vitals filed for this visit.                  Wound Therapy - 10/03/15 1543    Subjective Pt states that she had no pain with the dressing.     Patient and Family Stated Goals Wounds to heal once and for all.    Date of Onset 08/24/15   Prior Treatments MD, home health, home care    Pain Assessment No/denies pain  until wounds are cleansed this increases pain to a 4/10    Evaluation and Treatment Procedures Explained to Patient/Family Yes   Evaluation and Treatment Procedures agreed to   Wound Properties Date First Assessed: 09/24/15 Time First Assessed: 1133 Wound Type: Other (Comment) , venous stasis ulcer   Location: Leg Location  Orientation: Right;Distal Wound Description (Comments): Over ten small wounds scattered around anterior medial to anterior lateral with the largest being 2 cm diameter  Present on Admission: Yes   Dressing Type Compression wrap;Impregnated gauze (bismuth)   Dressing Changed Changed  changed bismith to silverhydrofiber; profore to profore lite   Dressing Status Old drainage   Dressing Change Frequency PRN   Site / Wound Assessment Friable;Red   % Wound base Red or Granulating 90%   % Wound base Yellow 10%   Peri-wound Assessment Hemosiderin   Wound Length (cm) --  lateral wounds have increased in size; now joining together    Drainage Amount Minimal   Drainage Description Sanguineous   Treatment Cleansed;Other (Comment)  changed dressing to silverhydrofiber and profore lite    Wound Properties Date First Assessed: 09/24/15 Time First Assessed: 1140 Wound Type: Other (Comment) Location: Leg Location Orientation: Left;Anterior Wound Description (Comments): 4 small wounds with the largest bein 1.5 x 1.0  Present on Admission: Yes   Dressing Type Compression wrap  Unna boot was on pt changed to profore lite    Dressing Changed Changed   Dressing Status Old drainage   Dressing Change Frequency PRN  Site / Wound Assessment Granulation tissue;Red;Yellow   % Wound base Red or Granulating 90%   % Wound base Yellow 10%   Peri-wound Assessment Erythema (blanchable);Hemosiderin  lateral aspect of leg noted redness with increased warmth.    Drainage Amount Minimal   Drainage Description Sanguineous   Treatment Cleansed  multilayer compression dressing     Selective Debridement - Location --  multiple wound sites    Selective Debridement - Tools Used --   Selective Debridement - Tissue Removed --   Wound Therapy - Clinical Statement Therapist noted new open wound along lateral aspect of left LE with surrounding tissue red and warm.  Questioned pt if she scratched under the bandage but pt denies.   Therapist showed daughter area of concern and explained that if she noted that the redness was increasing she was to contact MD about possible antibiotics.     Wound Therapy - Functional Problem List difficulty walking    Factors Delaying/Impairing Wound Healing Infection - systemic/local;Polypharmacy;Vascular compromise   Hydrotherapy Plan Debridement;Dressing change;Patient/family education   Wound Therapy - Frequency --  Twice a week x 4 weeks    Wound Therapy - Current Recommendations PT  change from xeroform to silverhydrofiber. Profore to lite    Dressing  Proforelite - bilaterally    Manual Therapy manual techniques not done due to possible infection located on lateral aspect of Rt LE                  PT Education - 10/03/15 1558    Education provided Yes   Education Details If redness increases along Lt lateral knee contact MD re antibiotics.    Person(s) Educated Patient;Child(ren)   Methods Explanation   Comprehension Verbalized understanding          PT Short Term Goals - 09/27/15 1618      PT SHORT TERM GOAL #1   Title Pt to verbalize the importance of using compression every day to keep legs wound free   Time 1   Period Weeks   Status On-going     PT SHORT TERM GOAL #2   Title Pt to state that the pain in her legs are no greater than 3/10 to allow pt to ambulate for five minutes without stopping    Time 2   Period Weeks   Status On-going     PT SHORT TERM GOAL #3   Title Pt to have only 5 wounds on Rt LE and 2 on Lt LE    Time 2   Period Weeks   Status On-going           PT Long Term Goals - 09/27/15 1618      PT LONG TERM GOAL #1   Title Pt to be wound free to reduce risk of infection    Time 4   Period Weeks   Status On-going     PT LONG TERM GOAL #2   Title Pt to have obtained compression garment and to be able to don and doff with minimal assist from family    Time 4   Period Weeks   Status On-going               Plan  - 10/03/15 1559    Clinical Impression Statement Overall pt wounds appear to have increased in size.  Pt daughter states that they have done this for the past 15 years;  appear to be getting better than get worse; appear to be getting  better and then get worse in a cyclic manner.  Pt wounds on Rt lateral aspect have increased in size to make one large wound.  Pt has a new wound on the lateral aspect of her left knee.  Surrounding skin is red and warm.  Pt daughter instructed to keep watching and if it grows to contact MD for some antibiotics.  Wounds did not need any debridement at this treatment session.     Rehab Potential Good   PT Frequency 2x / week   PT Duration 4 weeks   PT Treatment/Interventions ADLs/Self Care Home Management;Gait training;Stair training;Therapeutic exercise;Patient/family education;Other (comment)  debridement and multilayer compression dressing    PT Next Visit Plan continue with debridement when and if necessary and compression dressing until wounds are healed.  Continue with Profore lite not profore. Assess how silverhydrofiber affects wounds       Patient will benefit from skilled therapeutic intervention in order to improve the following deficits and impairments:  Pain, Increased edema, Difficulty walking, Decreased balance, Other (comment) (nonhealing wound )  Visit Diagnosis: Pain in right lower leg  Pain in left lower leg  Unsteadiness on feet  Varicose veins of right lower extremity with ulcer other part of lower leg (HCC)  Varicose veins of left lower extremity with ulcer other part of lower leg [id number])     Problem List Patient Active Problem List   Diagnosis Date Noted  . Alzheimer's disease 10/11/2014  . Essential hypertension 10/11/2014  . Cardiomyopathy, dilated ([place]) 07/24/2014  . Apnea, sleep 07/24/2014  . Anemia 04/28/2014  . Chronic hypoxemic respiratory failure ([place]) 04/12/2014  . Chronic ulcer of leg ([place]) 04/12/2014  . Lymphedema of  leg 04/12/2014  . Disorder of nutrition 04/12/2014  . Hypothyroidism 03/24/2014  . Stasis dermatitis of both legs 05/06/2013  . Pacemaker-St.Jude 11/25/2011  . Complete heart block ([place]) 12/12/2010  . Permanent atrial fibrillation ([place]) 12/12/2010  . Diastolic dysfunction [id number]  . Venous insufficiency 12/12/2010  . Long term current use of anticoagulant 09/30/2002   Rayetta Humphrey, PT CLT [phone] 10/03/2015, 4:08 PM  [place] [address] [place], [name], [address] Phone: [phone]   Fax:  [phone]  Name: [name] MRN: [id number] Date of Birth: 06-06-1933

## 2015-10-08 ENCOUNTER — Ambulatory Visit (HOSPITAL_COMMUNITY): Payer: PPO | Admitting: Physical Therapy

## 2015-10-08 DIAGNOSIS — I83018 Varicose veins of right lower extremity with ulcer other part of lower leg: Secondary | ICD-10-CM

## 2015-10-08 DIAGNOSIS — L97829 Non-pressure chronic ulcer of other part of left lower leg with unspecified severity: Secondary | ICD-10-CM

## 2015-10-08 DIAGNOSIS — R2681 Unsteadiness on feet: Secondary | ICD-10-CM

## 2015-10-08 DIAGNOSIS — M79662 Pain in left lower leg: Secondary | ICD-10-CM

## 2015-10-08 DIAGNOSIS — M79661 Pain in right lower leg: Secondary | ICD-10-CM

## 2015-10-08 DIAGNOSIS — I83028 Varicose veins of left lower extremity with ulcer other part of lower leg: Secondary | ICD-10-CM

## 2015-10-08 DIAGNOSIS — L97819 Non-pressure chronic ulcer of other part of right lower leg with unspecified severity: Secondary | ICD-10-CM

## 2015-10-08 NOTE — Therapy (Signed)
[place], [name], [address] Phone: [phone]   Fax:  [phone]  Wound Care Therapy  Patient Details  Name: [name] MRN: [id number] Date of Birth: 06-Jan-1934 No Data Recorded  Encounter Date: 10/08/2015      PT End of Session - 10/08/15 1606    Visit Number 4   Number of Visits 8   Date for PT Re-Evaluation 10/24/15   Authorization Type [employer]    Authorization - Visit Number 4   Authorization - Number of Visits 8   PT Start Time 1430   PT Stop Time 1530   PT Time Calculation (min) 60 min   Activity Tolerance Patient tolerated treatment well   Behavior During Therapy [facility] for tasks assessed/performed      Past Medical History:  Diagnosis Date  . Chronic lung disease    on home O2   . Complete heart block (HCC)    s/p PPM by Dr Rayann Heman  . Debilitated   . Diastolic dysfunction   . Hypertension   . Obesity   . Permanent atrial fibrillation ([place])   . Pulmonary hypertension ([place])   . Rheumatoid arthritis(714.0)   . Sleep apnea   . Stroke [facility])    remote  . Venous insufficiency    with chronic leg ulcers    Past Surgical History:  Procedure Laterality Date  . PACEMAKER INSERTION  08/13/10   SJM by Dr Rayann Heman    There were no vitals filed for this visit.                  Wound Therapy - 10/08/15 1553    Subjective Pt states she is doing well without pain.  Comes today with her daughter.  Rt foot dressing is soiled with BM.   Patient and Family Stated Goals Wounds to heal once and for all.    Date of Onset 08/24/15   Prior Treatments MD, home health, home care    Pain Assessment No/denies pain   Evaluation and Treatment Procedures Explained to Patient/Family Yes   Evaluation and Treatment Procedures agreed to   Wound Properties Date First Assessed: 09/24/15 Time First Assessed: 1133 Wound Type: Other (Comment) , venous stasis ulcer   Location: Leg Location  Orientation: Right;Distal Wound Description (Comments): Over ten small wounds scattered around anterior medial to anterior lateral with the largest being 2 cm diameter  Present on Admission: Yes   Dressing Type Compression wrap;Impregnated gauze (bismuth)   Dressing Changed Changed   Dressing Status Old drainage   Dressing Change Frequency PRN   Site / Wound Assessment Friable;Red   % Wound base Red or Granulating 100%   % Wound base Yellow 0%   Peri-wound Assessment Hemosiderin   Drainage Amount Minimal   Drainage Description Serosanguineous   Treatment Cleansed;Debridement (Selective)   Wound Properties Date First Assessed: 09/24/15 Time First Assessed: 1140 Wound Type: Other (Comment) Location: Leg Location Orientation: Left;Anterior Wound Description (Comments): 4 small wounds with the largest bein 1.5 x 1.0  Present on Admission: Yes   Dressing Type Compression wrap;Impregnated gauze (bismuth)   Dressing Changed Changed   Dressing Status Old drainage   Dressing Change Frequency PRN   Site / Wound Assessment Granulation tissue;Red;Yellow   % Wound base Red or Granulating 100%   % Wound base Yellow 0%   Peri-wound Assessment Hemosiderin  lateral aspect of leg noted redness with increased warmth.    Drainage Amount Minimal  Drainage Description Sanguineous   Treatment Cleansed;Debridement (Selective)   Selective Debridement - Location bilateral LE's  multiple wound sites    Selective Debridement - Tools Used Forceps   Selective Debridement - Tissue Removed slough and dry skin    Wound Therapy - Clinical Statement Dressings with silverhydrofiber adhered to wounds.  Cleansed LE's well and debrided remaining dry skin and slough to reveal 100%granulation.  Changed dressings to xeroform to maintain proper moisture.  Continued with profore light wit extra cotton placed around ankles to promote even compression.  Anticipate LE's to be healed in next 1-2 visits and will need to obtain  compression garments.      Wound Therapy - Functional Problem List difficulty walking    Factors Delaying/Impairing Wound Healing Infection - systemic/local;Polypharmacy;Vascular compromise   Hydrotherapy Plan Debridement;Dressing change;Patient/family education   Wound Therapy - Frequency --  Twice a week x 4 weeks    Wound Therapy - Current Recommendations PT  change from xeroform to silverhydrofiber. Profore to lite    Dressing  xeroform, profore lite to bilaterally    Manual Therapy --                   PT Short Term Goals - 09/27/15 1618      PT SHORT TERM GOAL #1   Title Pt to verbalize the importance of using compression every day to keep legs wound free   Time 1   Period Weeks   Status On-going     PT SHORT TERM GOAL #2   Title Pt to state that the pain in her legs are no greater than 3/10 to allow pt to ambulate for five minutes without stopping    Time 2   Period Weeks   Status On-going     PT SHORT TERM GOAL #3   Title Pt to have only 5 wounds on Rt LE and 2 on Lt LE    Time 2   Period Weeks   Status On-going           PT Long Term Goals - 09/27/15 1618      PT LONG TERM GOAL #1   Title Pt to be wound free to reduce risk of infection    Time 4   Period Weeks   Status On-going     PT LONG TERM GOAL #2   Title Pt to have obtained compression garment and to be able to don and doff with minimal assist from family    Time 4   Period Weeks   Status On-going             Patient will benefit from skilled therapeutic intervention in order to improve the following deficits and impairments:     Visit Diagnosis: Pain in right lower leg  Pain in left lower leg  Unsteadiness on feet  Varicose veins of right lower extremity with ulcer other part of lower leg (HCC)  Varicose veins of left lower extremity with ulcer other part of lower leg [facility])     Problem List Patient Active Problem List   Diagnosis Date Noted  . Alzheimer's  disease 10/11/2014  . Essential hypertension 10/11/2014  . Cardiomyopathy, dilated ([place]) 07/24/2014  . Apnea, sleep 07/24/2014  . Anemia 04/28/2014  . Chronic hypoxemic respiratory failure ([place]) 04/12/2014  . Chronic ulcer of leg ([place]) 04/12/2014  . Lymphedema of leg 04/12/2014  . Disorder of nutrition 04/12/2014  . Hypothyroidism 03/24/2014  . Stasis dermatitis of both legs 05/06/2013  .  Pacemaker-St.Jude 11/25/2011  . Complete heart block ([place]) 12/12/2010  . Permanent atrial fibrillation ([place]) 12/12/2010  . Diastolic dysfunction [id number]  . Venous insufficiency 12/12/2010  . Long term current use of anticoagulant 09/30/2002    Teena Irani, PTA/CLT [phone]  10/08/2015, 4:09 PM  [place] [address] [place], [name], [address] Phone: [phone]   Fax:  [phone]  Name: [name] MRN: [id number] Date of Birth: 09/27/1933

## 2015-10-10 ENCOUNTER — Other Ambulatory Visit: Payer: Self-pay | Admitting: Family Medicine

## 2015-10-10 ENCOUNTER — Ambulatory Visit (HOSPITAL_COMMUNITY): Payer: PPO | Admitting: Physical Therapy

## 2015-10-10 DIAGNOSIS — I83028 Varicose veins of left lower extremity with ulcer other part of lower leg: Secondary | ICD-10-CM

## 2015-10-10 DIAGNOSIS — R2681 Unsteadiness on feet: Secondary | ICD-10-CM

## 2015-10-10 DIAGNOSIS — I83018 Varicose veins of right lower extremity with ulcer other part of lower leg: Secondary | ICD-10-CM

## 2015-10-10 DIAGNOSIS — M79661 Pain in right lower leg: Secondary | ICD-10-CM

## 2015-10-10 DIAGNOSIS — L97819 Non-pressure chronic ulcer of other part of right lower leg with unspecified severity: Secondary | ICD-10-CM

## 2015-10-10 DIAGNOSIS — L97829 Non-pressure chronic ulcer of other part of left lower leg with unspecified severity: Secondary | ICD-10-CM

## 2015-10-10 DIAGNOSIS — M79662 Pain in left lower leg: Secondary | ICD-10-CM

## 2015-10-10 NOTE — Therapy (Signed)
[name], [name], [address] Phone: [phone]   Fax:  [phone]  Wound Care Therapy  Patient Details  Name: [name] MRN: [id number] Date of Birth: Dec 23, 1933 No Data Recorded  Encounter Date: 10/10/2015      PT End of Session - 10/10/15 1225    Visit Number 5   Number of Visits 8   Date for PT Re-Evaluation 10/24/15   Authorization Type [employer]    Authorization - Visit Number 5   Authorization - Number of Visits 8   PT Start Time [id number]   PT Stop Time 1140   PT Time Calculation (min) 62 min   Activity Tolerance Patient tolerated treatment well   Behavior During Therapy [facility] for tasks assessed/performed      Past Medical History:  Diagnosis Date  . Chronic lung disease    on home O2   . Complete heart block (HCC)    s/p PPM by Dr Rayann Heman  . Debilitated   . Diastolic dysfunction   . Hypertension   . Obesity   . Permanent atrial fibrillation ([place])   . Pulmonary hypertension ([place])   . Rheumatoid arthritis(714.0)   . Sleep apnea   . Stroke [facility])    remote  . Venous insufficiency    with chronic leg ulcers    Past Surgical History:  Procedure Laterality Date  . PACEMAKER INSERTION  08/13/10   SJM by Dr Rayann Heman    There were no vitals filed for this visit.       Subjective Assessment - 10/10/15 1222    Subjective Pt reports no issues.  Daughter states she does not complain.                   Wound Therapy - 10/10/15 1222    Subjective Pt states she is doing well without pain.  Comes today with her daughter.  Rt foot dressing is soiled with BM.   Patient and Family Stated Goals Wounds to heal once and for all.    Date of Onset 08/24/15   Prior Treatments MD, home health, home care    Evaluation and Treatment Procedures Explained to Patient/Family Yes   Evaluation and Treatment Procedures agreed to   Wound Properties Date First Assessed: 09/24/15 Time  First Assessed: 1133 Wound Type: Other (Comment) , venous stasis ulcer   Location: Leg Location Orientation: Right;Distal Wound Description (Comments): Over ten small wounds scattered around anterior medial to anterior lateral with the largest being 2 cm diameter  Present on Admission: Yes   Dressing Type Compression wrap;Impregnated gauze (bismuth)   Dressing Status Old drainage   Dressing Change Frequency PRN   Site / Wound Assessment Friable;Red   % Wound base Red or Granulating 100%   % Wound base Yellow 0%   Peri-wound Assessment Hemosiderin   Drainage Amount Minimal   Drainage Description Serosanguineous   Treatment Cleansed;Debridement (Selective)   Wound Properties Date First Assessed: 09/24/15 Time First Assessed: 1140 Wound Type: Other (Comment) Location: Leg Location Orientation: Left;Anterior Wound Description (Comments): 4 small wounds with the largest bein 1.5 x 1.0  Present on Admission: Yes   Dressing Type Compression wrap;Impregnated gauze (bismuth)   Dressing Changed Changed   Dressing Status Old drainage   Dressing Change Frequency PRN   Site / Wound Assessment Granulation tissue;Red;Yellow   % Wound base Red or Granulating 100%   % Wound base Yellow 0%   Peri-wound Assessment Hemosiderin  lateral aspect of leg noted redness with increased warmth.    Drainage Amount Minimal   Drainage Description Sanguineous   Treatment Cleansed;Debridement (Selective)   Selective Debridement - Location bilateral LE's  multiple wound sites    Selective Debridement - Tools Used Forceps   Selective Debridement - Tissue Removed slough and dry skin    Wound Therapy - Clinical Statement wounds continue to approximate and heal.  Some wounds with increased slough though easily removed.  Rt LE lateral wound with increased drainage so changed dressing back to silver hydrofiber in this area.  continued with xeroform in all other areas.  Educated daughter on compression stockings and gave pamphlet  for outlet.  LE's no longer with edema or induration.    Wound Therapy - Functional Problem List difficulty walking    Factors Delaying/Impairing Wound Healing Infection - systemic/local;Polypharmacy;Vascular compromise   Hydrotherapy Plan Debridement;Dressing change;Patient/family education   Wound Therapy - Frequency --  Twice a week x 4 weeks    Wound Therapy - Current Recommendations PT  change from xeroform to silverhydrofiber. Profore to lite    Dressing  Lt LE:  xeroform, profore lite    Dressing Rt LE:  lateral wound silver hydrofiber, all others xerform, profore light                  PT Education - 10/10/15 1226    Education provided Yes   Education Details educated on compression garments and given pamphlet for outlet in [place].  Cues for increasing stride and posture with ambulation.   Person(s) Educated Patient;Child(ren)   Methods Explanation;Demonstration;Tactile cues;Verbal cues;Handout   Comprehension Verbalized understanding;Returned demonstration;Verbal cues required;Tactile cues required;Need further instruction          PT Short Term Goals - 09/27/15 1618      PT SHORT TERM GOAL #1   Title Pt to verbalize the importance of using compression every day to keep legs wound free   Time 1   Period Weeks   Status On-going     PT SHORT TERM GOAL #2   Title Pt to state that the pain in her legs are no greater than 3/10 to allow pt to ambulate for five minutes without stopping    Time 2   Period Weeks   Status On-going     PT SHORT TERM GOAL #3   Title Pt to have only 5 wounds on Rt LE and 2 on Lt LE    Time 2   Period Weeks   Status On-going           PT Long Term Goals - 09/27/15 1618      PT LONG TERM GOAL #1   Title Pt to be wound free to reduce risk of infection    Time 4   Period Weeks   Status On-going     PT LONG TERM GOAL #2   Title Pt to have obtained compression garment and to be able to don and doff with minimal assist from  family    Time 4   Period Weeks   Status On-going             Patient will benefit from skilled therapeutic intervention in order to improve the following deficits and impairments:     Visit Diagnosis: Pain in right lower leg  Pain in left lower leg  Unsteadiness on feet  Varicose veins of right lower extremity with ulcer other part of lower leg (HCC)  Varicose veins of left  lower extremity with ulcer other part of lower leg [facility])     Problem List Patient Active Problem List   Diagnosis Date Noted  . Alzheimer's disease 10/11/2014  . Essential hypertension 10/11/2014  . Cardiomyopathy, dilated ([place]) 07/24/2014  . Apnea, sleep 07/24/2014  . Anemia 04/28/2014  . Chronic hypoxemic respiratory failure ([place]) 04/12/2014  . Chronic ulcer of leg ([place]) 04/12/2014  . Lymphedema of leg 04/12/2014  . Disorder of nutrition 04/12/2014  . Hypothyroidism 03/24/2014  . Stasis dermatitis of both legs 05/06/2013  . Pacemaker-St.Jude 11/25/2011  . Complete heart block ([place]) 12/12/2010  . Permanent atrial fibrillation ([place]) 12/12/2010  . Diastolic dysfunction [id number]  . Venous insufficiency 12/12/2010  . Long term current use of anticoagulant 09/30/2002    Teena Irani, PTA/CLT [phone]  10/10/2015, 12:29 PM  [place] [address] [place], [name], [address] Phone: [phone]   Fax:  [phone]  Name: [name] MRN: [id number] Date of Birth: 24-Dec-1933

## 2015-10-16 ENCOUNTER — Ambulatory Visit (HOSPITAL_COMMUNITY): Payer: PPO | Admitting: Physical Therapy

## 2015-10-16 DIAGNOSIS — L97819 Non-pressure chronic ulcer of other part of right lower leg with unspecified severity: Secondary | ICD-10-CM

## 2015-10-16 DIAGNOSIS — I83028 Varicose veins of left lower extremity with ulcer other part of lower leg: Secondary | ICD-10-CM

## 2015-10-16 DIAGNOSIS — M79661 Pain in right lower leg: Secondary | ICD-10-CM | POA: Diagnosis not present

## 2015-10-16 DIAGNOSIS — I83018 Varicose veins of right lower extremity with ulcer other part of lower leg: Secondary | ICD-10-CM

## 2015-10-16 DIAGNOSIS — L97829 Non-pressure chronic ulcer of other part of left lower leg with unspecified severity: Secondary | ICD-10-CM

## 2015-10-16 DIAGNOSIS — M79662 Pain in left lower leg: Secondary | ICD-10-CM

## 2015-10-16 DIAGNOSIS — R2681 Unsteadiness on feet: Secondary | ICD-10-CM

## 2015-10-16 NOTE — Therapy (Signed)
[place], [name], [address] Phone: [phone]   Fax:  [phone]  Wound Care Therapy  Patient Details  Name: [name] MRN: [id number] Date of Birth: 1933/07/05 No Data Recorded  Encounter Date: 10/16/2015      PT End of Session - 10/16/15 1241    Visit Number 6   Number of Visits 8   Date for PT Re-Evaluation 10/24/15   Authorization Type [employer]    Authorization - Visit Number 6   Authorization - Number of Visits 8   PT Start Time 1130   PT Stop Time 1215   PT Time Calculation (min) 45 min   Activity Tolerance Patient tolerated treatment well   Behavior During Therapy [facility] for tasks assessed/performed      Past Medical History:  Diagnosis Date  . Chronic lung disease    on home O2   . Complete heart block (HCC)    s/p PPM by Dr Rayann Heman  . Debilitated   . Diastolic dysfunction   . Hypertension   . Obesity   . Permanent atrial fibrillation ([place])   . Pulmonary hypertension ([place])   . Rheumatoid arthritis(714.0)   . Sleep apnea   . Stroke [facility])    remote  . Venous insufficiency    with chronic leg ulcers    Past Surgical History:  Procedure Laterality Date  . PACEMAKER INSERTION  08/13/10   SJM by Dr Rayann Heman    There were no vitals filed for this visit.       Subjective Assessment - 10/16/15 1236    Subjective Pt states that she is not having any pain    Pertinent History COPD with oxygen dependency (4L), HTN, RA, CVA, venous insufficency with chronic leg wounds, lymphedema.                   Wound Therapy - 10/16/15 1237    Subjective Pt states that she is in no pain    Patient and Family Stated Goals Wounds to heal once and for all.    Date of Onset 08/24/15   Prior Treatments MD, home health, home care    Pain Assessment No/denies pain   Evaluation and Treatment Procedures Explained to Patient/Family Yes   Evaluation and Treatment Procedures  agreed to   Wound Properties Date First Assessed: 09/24/15 Time First Assessed: 1133 Wound Type: Other (Comment) , venous stasis ulcer   Location: Leg Location Orientation: Right;Distal Wound Description (Comments): Over ten small wounds scattered around anterior medial to anterior lateral with the largest being 2 cm diameter  Present on Admission: Yes   Dressing Type Compression wrap;Impregnated gauze (bismuth);Silver dressings   Dressing Changed Changed   Dressing Status Old drainage   Dressing Change Frequency PRN   Site / Wound Assessment Friable;Red   % Wound base Red or Granulating 100%   % Wound base Yellow 0%   Peri-wound Assessment Hemosiderin   Drainage Amount Minimal   Drainage Description Serosanguineous   Treatment Cleansed;Debridement (Selective)   Wound Properties Date First Assessed: 09/24/15 Time First Assessed: 1140 Wound Type: Other (Comment) Location: Leg Location Orientation: Left;Anterior Wound Description (Comments): 4 small wounds with the largest bein 1.5 x 1.0  Present on Admission: Yes   Dressing Type Compression wrap;Impregnated gauze (bismuth)   Dressing Changed Changed   Dressing Status Old drainage   Dressing Change Frequency PRN   Site / Wound Assessment Granulation tissue;Red;Yellow   % Wound base  Red or Granulating 100%   % Wound base Yellow 0%   Peri-wound Assessment Hemosiderin  lateral aspect of leg noted redness with increased warmth.    Drainage Amount Minimal   Drainage Description Sanguineous   Treatment Cleansed;Debridement (Selective)   Selective Debridement - Location bilateral LE's  multiple wound sites    Selective Debridement - Tools Used Forceps   Selective Debridement - Tissue Removed slough and dry skin    Wound Therapy - Clinical Statement Wounds have decreased drainage on Rt LE therefore changed from silverhydrofiber to acticoat dressing to wound site followed by application of profore lite.  Lt LE wounds continue to improve with  xeroform followed by profore lite to LT LT.     Wound Therapy - Functional Problem List difficulty walking    Factors Delaying/Impairing Wound Healing Infection - systemic/local;Polypharmacy;Vascular compromise   Hydrotherapy Plan Debridement;Dressing change;Patient/family education   Wound Therapy - Frequency --  Twice a week x 4 weeks    Wound Therapy - Current Recommendations PT  change from xeroform to silverhydrofiber. Profore to lite    Dressing  Lt LE:  xeroform, profore lite    Dressing Rt LE:  acticoat profore lite.  [place] LE moisturized prior to dressing                   PT Short Term Goals - 09/27/15 1618      PT SHORT TERM GOAL #1   Title Pt to verbalize the importance of using compression every day to keep legs wound free   Time 1   Period Weeks   Status On-going     PT SHORT TERM GOAL #2   Title Pt to state that the pain in her legs are no greater than 3/10 to allow pt to ambulate for five minutes without stopping    Time 2   Period Weeks   Status On-going     PT SHORT TERM GOAL #3   Title Pt to have only 5 wounds on Rt LE and 2 on Lt LE    Time 2   Period Weeks   Status On-going           PT Long Term Goals - 09/27/15 1618      PT LONG TERM GOAL #1   Title Pt to be wound free to reduce risk of infection    Time 4   Period Weeks   Status On-going     PT LONG TERM GOAL #2   Title Pt to have obtained compression garment and to be able to don and doff with minimal assist from family    Time 4   Period Weeks   Status On-going               Plan - 10/16/15 1241    Clinical Impression Statement see above    Rehab Potential Good   PT Frequency 2x / week   PT Duration 4 weeks   PT Treatment/Interventions ADLs/Self Care Home Management;Gait training;Stair training;Therapeutic exercise;Patient/family education;Other (comment)  debridement and multilayer compression dressing    PT Next Visit Plan continue with debridement when and if  necessary and compression dressing until wounds are healed.  Continue with Profore lite not profore. Assess how silverhydrofiber affects wounds       Patient will benefit from skilled therapeutic intervention in order to improve the following deficits and impairments:  Pain, Increased edema, Difficulty walking, Decreased balance, Other (comment) (nonhealing wound )  Visit Diagnosis: Pain in  right lower leg  Pain in left lower leg  Unsteadiness on feet  Varicose veins of right lower extremity with ulcer other part of lower leg (HCC)  Varicose veins of left lower extremity with ulcer other part of lower leg [facility])     Problem List Patient Active Problem List   Diagnosis Date Noted  . Alzheimer's disease 10/11/2014  . Essential hypertension 10/11/2014  . Cardiomyopathy, dilated ([place]) 07/24/2014  . Apnea, sleep 07/24/2014  . Anemia 04/28/2014  . Chronic hypoxemic respiratory failure ([place]) 04/12/2014  . Chronic ulcer of leg ([place]) 04/12/2014  . Lymphedema of leg 04/12/2014  . Disorder of nutrition 04/12/2014  . Hypothyroidism 03/24/2014  . Stasis dermatitis of both legs 05/06/2013  . Pacemaker-St.Jude 11/25/2011  . Complete heart block ([place]) 12/12/2010  . Permanent atrial fibrillation ([place]) 12/12/2010  . Diastolic dysfunction [id number]  . Venous insufficiency 12/12/2010  . Long term current use of anticoagulant 09/30/2002    Rayetta Humphrey, PT CLT [phone] 10/16/2015, 12:42 PM  [place] [address] [place], [name], [address] Phone: [phone]   Fax:  [phone]  Name: BRONX XING MRN: [id number] Date of Birth: 1933-06-13

## 2015-10-18 ENCOUNTER — Ambulatory Visit (HOSPITAL_COMMUNITY): Payer: PPO | Admitting: Physical Therapy

## 2015-10-18 DIAGNOSIS — M79661 Pain in right lower leg: Secondary | ICD-10-CM

## 2015-10-18 DIAGNOSIS — I83028 Varicose veins of left lower extremity with ulcer other part of lower leg: Secondary | ICD-10-CM

## 2015-10-18 DIAGNOSIS — R2681 Unsteadiness on feet: Secondary | ICD-10-CM

## 2015-10-18 DIAGNOSIS — M79662 Pain in left lower leg: Secondary | ICD-10-CM

## 2015-10-18 DIAGNOSIS — L97819 Non-pressure chronic ulcer of other part of right lower leg with unspecified severity: Secondary | ICD-10-CM

## 2015-10-18 DIAGNOSIS — L97829 Non-pressure chronic ulcer of other part of left lower leg with unspecified severity: Secondary | ICD-10-CM

## 2015-10-18 DIAGNOSIS — I83018 Varicose veins of right lower extremity with ulcer other part of lower leg: Secondary | ICD-10-CM

## 2015-10-18 NOTE — Therapy (Signed)
[place], [name], [address] Phone: [phone]   Fax:  [phone]  Wound Care Therapy  Patient Details  Name: [name] MRN: [id number] Date of Birth: Aug 29, 1933 No Data Recorded  Encounter Date: 10/18/2015      PT End of Session - 10/18/15 1423    Visit Number 7   Number of Visits 8   Date for PT Re-Evaluation 10/24/15   Authorization Type [employer]    Authorization - Visit Number 7   Authorization - Number of Visits 8   PT Start Time [id number]   PT Stop Time 1415   PT Time Calculation (min) 67 min   Activity Tolerance Patient tolerated treatment well   Behavior During Therapy [facility] for tasks assessed/performed      Past Medical History:  Diagnosis Date  . Chronic lung disease    on home O2   . Complete heart block (HCC)    s/p PPM by Dr Rayann Heman  . Debilitated   . Diastolic dysfunction   . Hypertension   . Obesity   . Permanent atrial fibrillation ([place])   . Pulmonary hypertension ([place])   . Rheumatoid arthritis(714.0)   . Sleep apnea   . Stroke [facility])    remote  . Venous insufficiency    with chronic leg ulcers    Past Surgical History:  Procedure Laterality Date  . PACEMAKER INSERTION  08/13/10   SJM by Dr Rayann Heman    There were no vitals filed for this visit.                  Wound Therapy - 10/18/15 1418    Subjective Patient reports no pain or discomfort.     Patient and Family Stated Goals Wounds to heal once and for all.    Date of Onset 08/24/15   Prior Treatments MD, home health, home care    Wound Properties Date First Assessed: 09/24/15 Time First Assessed: 1133 Wound Type: Other (Comment) , venous stasis ulcer   Location: Leg Location Orientation: Right;Distal Wound Description (Comments): Over ten small wounds scattered around anterior medial to anterior lateral with the largest being 2 cm diameter  Present on Admission: Yes   Dressing Type Compression  wrap;Impregnated gauze (bismuth);Silver dressings   Dressing Changed Changed   Dressing Status Old drainage   Dressing Change Frequency PRN   Site / Wound Assessment Friable;Red   % Wound base Red or Granulating 100%   % Wound base Yellow 0%   Peri-wound Assessment Hemosiderin   Drainage Amount Minimal   Drainage Description Serosanguineous   Treatment Cleansed;Debridement (Selective)   Wound Properties Date First Assessed: 09/24/15 Time First Assessed: 1140 Wound Type: Other (Comment) Location: Leg Location Orientation: Left;Anterior Wound Description (Comments): 4 small wounds with the largest bein 1.5 x 1.0  Present on Admission: Yes   Dressing Type Compression wrap;Impregnated gauze (bismuth)   Dressing Changed Changed   Dressing Status Old drainage   Dressing Change Frequency PRN   Site / Wound Assessment Granulation tissue;Red;Yellow   % Wound base Red or Granulating 100%   % Wound base Yellow 0%   Peri-wound Assessment Hemosiderin  lateral aspect of leg noted redness with increased warmth.    Drainage Amount Scant   Drainage Description Sanguineous   Treatment Cleansed;Debridement (Selective)   Selective Debridement - Location bilateral LE's  multiple wound sites    Selective Debridement - Tools Used Forceps   Selective Debridement - Tissue Removed  slough and dry skin    Wound Therapy - Clinical Statement Much improvement noted in Lt LE with scant drainage and continued 100% granulation.  wounds have decreased in size and only with 4-5 small scattered areas.  Rt LE with increased drainage on medial anterior wound, soaking through bandage.  Changed this back to silver hydrofiber.  All others with minimal drainage and continued with xeroform.  Noted redness around Rt LE extending up towards knee.  Marked top of redness with marker and told daughter to watch this.     Wound Therapy - Functional Problem List difficulty walking    Factors Delaying/Impairing Wound Healing Infection -  systemic/local;Polypharmacy;Vascular compromise   Hydrotherapy Plan Debridement;Dressing change;Patient/family education   Wound Therapy - Frequency --  Twice a week x 4 weeks    Wound Therapy - Current Recommendations PT  change from xeroform to silverhydrofiber. Profore to lite    Dressing  Lt LE:  xeroform, profore lite    Dressing Rt LE:  xeroform, silver hydrofiber, profore lite.                     PT Short Term Goals - 09/27/15 1618      PT SHORT TERM GOAL #1   Title Pt to verbalize the importance of using compression every day to keep legs wound free   Time 1   Period Weeks   Status On-going     PT SHORT TERM GOAL #2   Title Pt to state that the pain in her legs are no greater than 3/10 to allow pt to ambulate for five minutes without stopping    Time 2   Period Weeks   Status On-going     PT SHORT TERM GOAL #3   Title Pt to have only 5 wounds on Rt LE and 2 on Lt LE    Time 2   Period Weeks   Status On-going           PT Long Term Goals - 09/27/15 1618      PT LONG TERM GOAL #1   Title Pt to be wound free to reduce risk of infection    Time 4   Period Weeks   Status On-going     PT LONG TERM GOAL #2   Title Pt to have obtained compression garment and to be able to don and doff with minimal assist from family    Time 4   Period Weeks   Status On-going             Patient will benefit from skilled therapeutic intervention in order to improve the following deficits and impairments:     Visit Diagnosis: Pain in right lower leg  Pain in left lower leg  Unsteadiness on feet  Varicose veins of right lower extremity with ulcer other part of lower leg (HCC)  Varicose veins of left lower extremity with ulcer other part of lower leg [facility])     Problem List Patient Active Problem List   Diagnosis Date Noted  . Alzheimer's disease 10/11/2014  . Essential hypertension 10/11/2014  . Cardiomyopathy, dilated ([place]) 07/24/2014  . Apnea,  sleep 07/24/2014  . Anemia 04/28/2014  . Chronic hypoxemic respiratory failure ([place]) 04/12/2014  . Chronic ulcer of leg ([place]) 04/12/2014  . Lymphedema of leg 04/12/2014  . Disorder of nutrition 04/12/2014  . Hypothyroidism 03/24/2014  . Stasis dermatitis of both legs 05/06/2013  . Pacemaker-St.Jude 11/25/2011  . Complete heart block ([place]) 12/12/2010  .  Permanent atrial fibrillation ([place]) 12/12/2010  . Diastolic dysfunction [id number]  . Venous insufficiency 12/12/2010  . Long term current use of anticoagulant 09/30/2002    Teena Irani, PTA/CLT [phone]  10/18/2015, 2:24 PM  [place] [address] [place], [name], [address] Phone: [phone]   Fax:  [phone]  Name: MARCINDA LEAVERTON MRN: [id number] Date of Birth: 03/18/1933

## 2015-10-23 ENCOUNTER — Ambulatory Visit (HOSPITAL_COMMUNITY): Payer: PPO | Admitting: Physical Therapy

## 2015-10-23 DIAGNOSIS — L97819 Non-pressure chronic ulcer of other part of right lower leg with unspecified severity: Secondary | ICD-10-CM

## 2015-10-23 DIAGNOSIS — R2681 Unsteadiness on feet: Secondary | ICD-10-CM

## 2015-10-23 DIAGNOSIS — I83028 Varicose veins of left lower extremity with ulcer other part of lower leg: Secondary | ICD-10-CM

## 2015-10-23 DIAGNOSIS — I83018 Varicose veins of right lower extremity with ulcer other part of lower leg: Secondary | ICD-10-CM

## 2015-10-23 DIAGNOSIS — M79662 Pain in left lower leg: Secondary | ICD-10-CM

## 2015-10-23 DIAGNOSIS — M79661 Pain in right lower leg: Secondary | ICD-10-CM | POA: Diagnosis not present

## 2015-10-23 DIAGNOSIS — L97829 Non-pressure chronic ulcer of other part of left lower leg with unspecified severity: Secondary | ICD-10-CM

## 2015-10-23 NOTE — Therapy (Signed)
[name], [name], [address] Phone: [phone]   Fax:  [phone]  Wound Care Therapy  Patient Details  Name: [name] MRN: [id number] Date of Birth: 1933/05/29 No Data Recorded  Encounter Date: 10/23/2015      PT End of Session - 10/23/15 1219    Visit Number 8   Number of Visits 16   Date for PT Re-Evaluation 11/22/15   Authorization Type [employer] : g code and recert done at visit 8   Authorization - Visit Number 8   Authorization - Number of Visits 16   PT Start Time [id number]   PT Stop Time 1144   PT Time Calculation (min) 69 min   Activity Tolerance Patient tolerated treatment well   Behavior During Therapy [facility] for tasks assessed/performed      Past Medical History:  Diagnosis Date  . Chronic lung disease    on home O2   . Complete heart block (HCC)    s/p PPM by Dr Rayann Heman  . Debilitated   . Diastolic dysfunction   . Hypertension   . Obesity   . Permanent atrial fibrillation ([place])   . Pulmonary hypertension ([place])   . Rheumatoid arthritis(714.0)   . Sleep apnea   . Stroke [facility])    remote  . Venous insufficiency    with chronic leg ulcers    Past Surgical History:  Procedure Laterality Date  . PACEMAKER INSERTION  08/13/10   SJM by Dr Rayann Heman    There were no vitals filed for this visit.                  Wound Therapy - 10/23/15 1158    Subjective Patient states that he legs feel good; no pain.   Patient and Family Stated Goals Wounds to heal once and for all.    Date of Onset 08/24/15   Prior Treatments MD, home health, home care    Pain Assessment No/denies pain   Wound Properties Date First Assessed: 09/24/15 Time First Assessed: 1133 Wound Type: Other (Comment) , venous stasis ulcer   Location: Leg Location Orientation: Right;Distal Wound Description (Comments): Over ten small wounds scattered around anterior medial to anterior lateral with the  largest being 2 cm diameter  Present on Admission: Yes   Dressing Type Compression wrap;Impregnated gauze (bismuth);Silver dressings   Dressing Changed Changed   Dressing Status Old drainage   Dressing Change Frequency PRN   Site / Wound Assessment Friable;Red   % Wound base Red or Granulating 100%  was 90%    % Wound base Yellow 0%  was 10%    Peri-wound Assessment Hemosiderin   Wound Length (cm) --  Pt had over 10 wounds now down to 10 but increase in size    Wound Width (cm) --  Lg wound is 4.5 x 5 cm at this time smallest .5 cm    Wound Depth (cm) 0 cm   Margins Attached edges (approximated)   Drainage Amount Minimal   Drainage Description Serosanguineous   Treatment Cleansed;Debridement (Selective);Other (Comment)  compression;  changed to profore lite vs. profore   Wound Properties Date First Assessed: 09/24/15 Time First Assessed: 1140 Wound Type: Other (Comment) Location: Leg Location Orientation: Left;Anterior Wound Description (Comments): 4 small wounds with the largest bein 1.5 x 1.0  Present on Admission: Yes   Dressing Type Compression wrap;Impregnated gauze (bismuth)   Dressing Changed Changed   Dressing Status Old drainage  Dressing Change Frequency PRN   Site / Wound Assessment Granulation tissue;Red  did have yellow at initial evaluation    % Wound base Red or Granulating 100%  inital evaluation was 90%    % Wound base Yellow 0%  initial evaluation was 10%    Peri-wound Assessment Hemosiderin  lateral aspect of leg noted redness with increased warmth.    Wound Length (cm) --  initially 4 wounds now 3; initially 1.5x1 largest;    Wound Width (cm) --  medial aspect of ankle has 3 small .3 diameter wounds    Drainage Amount Scant   Drainage Description Sanguineous   Treatment Cleansed;Debridement (Selective);Other (Comment)  multilayer compression dressing.    Selective Debridement - Location bilateral LE's  multiple wound sites    Selective Debridement -  Tools Used Forceps   Selective Debridement - Tissue Removed slough and dry skin    Wound Therapy - Clinical Statement Pt wounds continue to improve.  Dressing changed to profore lite from profore due to anterior location of wounds and unknown arterial flow.  Pt dressed with acticoat and xeroform followed by profore lite.  Wound numbers are decreasing, LE both have decreased edema, but a few wounds have increased in size.     Wound Therapy - Functional Problem List difficulty walking    Factors Delaying/Impairing Wound Healing Infection - systemic/local;Polypharmacy;Vascular compromise   Hydrotherapy Plan Debridement;Dressing change;Patient/family education   Wound Therapy - Frequency --  Twice a week x four more weeks for a total of 8 weeks.    Wound Therapy - Current Recommendations PT  change from xeroform to silverhydrofiber. Profore to lite    Dressing  Lt LE:  xeroform, profore lite    Dressing Rt LE:  xeroform, silver , profore lite.                     PT Short Term Goals - 10/23/15 1216      PT SHORT TERM GOAL #1   Title Pt to verbalize the importance of using compression every day to keep legs wound free   Time 1   Period Weeks   Status Achieved     PT SHORT TERM GOAL #2   Title Pt to state that the pain in her legs are no greater than 3/10 to allow pt to ambulate for five minutes without stopping    Time 2   Period Weeks   Status Achieved     PT SHORT TERM GOAL #3   Title Pt to have only 5 wounds on Rt LE and 2 on Lt LE    Time 2   Period Weeks   Status On-going           PT Long Term Goals - 10/23/15 1216      PT LONG TERM GOAL #1   Title Pt to be wound free to reduce risk of infection    Time 4   Period Weeks   Status On-going     PT LONG TERM GOAL #2   Title Pt to have obtained compression garment and to be able to don and doff with minimal assist from family    Time 4   Period Weeks   Status On-going               Plan - 10/23/15  1213    Clinical Impression Statement LE are no longer red.  Wounds have decreased in drainage, improved in granulation and edema in LE has  improved. Overall wound numbers have decreased but size has increased.  Recommend continued treatment for four more weeks to continue to promote a healing environment and decrease risk of infection.    Rehab Potential Good   PT Frequency 2x / week   PT Duration 8 weeks  an additional 4 weeks    PT Treatment/Interventions ADLs/Self Care Home Management;Gait training;Stair training;Therapeutic exercise;Patient/family education;Other (comment)  debridement and multilayer compression dressing    PT Next Visit Plan continue with debridement when and if necessary and compression dressing until wounds are healed.  Continue with Profore lite not profore. Assess how silverhydrofiber affects wounds       Patient will benefit from skilled therapeutic intervention in order to improve the following deficits and impairments:  Pain, Increased edema, Difficulty walking, Decreased balance, Other (comment) (nonhealing wound )  Visit Diagnosis: Pain in right lower leg  Pain in left lower leg  Unsteadiness on feet  Varicose veins of right lower extremity with ulcer other part of lower leg (HCC)  Varicose veins of left lower extremity with ulcer other part of lower leg ([place])      G-Codes - 10/28/15 05-03-1215    Functional Assessment Tool Used clinical judgement: number of wounds and the time that they have been present.    Functional Limitation Other PT primary   Other PT Primary Current Status [id number]) At least 40 percent but less than 60 percent impaired, limited or restricted   Other PT Primary Goal Status [id number]) At least 20 percent but less than 40 percent impaired, limited or restricted       Problem List Patient Active Problem List   Diagnosis Date Noted  . Alzheimer's disease 10/11/2014  . Essential hypertension 10/11/2014  . Cardiomyopathy, dilated ([place])  07/24/2014  . Apnea, sleep 07/24/2014  . Anemia May 03, 2014  . Chronic hypoxemic respiratory failure ([place]) 04/12/2014  . Chronic ulcer of leg ([place]) 04/12/2014  . Lymphedema of leg 04/12/2014  . Disorder of nutrition 04/12/2014  . Hypothyroidism 03/24/2014  . Stasis dermatitis of both legs 05/06/2013  . Pacemaker-St.Jude 11/25/2011  . Complete heart block ([place]) 12/12/2010  . Permanent atrial fibrillation ([place]) 12/12/2010  . Diastolic dysfunction [id number]  . Venous insufficiency 12/12/2010  . Long term current use of anticoagulant 09/30/2002    Rayetta Humphrey, PT CLT [phone] 10-28-15, 12:20 PM  [place], [name], [address] Phone: [phone]   Fax:  [phone]  Name: [name] MRN: [id number] Date of Birth: Apr 09, 1933

## 2015-10-25 ENCOUNTER — Ambulatory Visit (HOSPITAL_COMMUNITY): Payer: PPO | Admitting: Physical Therapy

## 2015-10-25 DIAGNOSIS — L97829 Non-pressure chronic ulcer of other part of left lower leg with unspecified severity: Secondary | ICD-10-CM

## 2015-10-25 DIAGNOSIS — R2681 Unsteadiness on feet: Secondary | ICD-10-CM

## 2015-10-25 DIAGNOSIS — M79661 Pain in right lower leg: Secondary | ICD-10-CM

## 2015-10-25 DIAGNOSIS — M79662 Pain in left lower leg: Secondary | ICD-10-CM

## 2015-10-25 DIAGNOSIS — J449 Chronic obstructive pulmonary disease, unspecified: Secondary | ICD-10-CM | POA: Diagnosis not present

## 2015-10-25 DIAGNOSIS — I83018 Varicose veins of right lower extremity with ulcer other part of lower leg: Secondary | ICD-10-CM

## 2015-10-25 DIAGNOSIS — L97819 Non-pressure chronic ulcer of other part of right lower leg with unspecified severity: Secondary | ICD-10-CM

## 2015-10-25 DIAGNOSIS — I83028 Varicose veins of left lower extremity with ulcer other part of lower leg: Secondary | ICD-10-CM

## 2015-10-25 NOTE — Therapy (Signed)
[name], [name], [address] Phone: [phone]   Fax:  [phone]  Wound Care Therapy  Patient Details  Name: [name] MRN: [id number] Date of Birth: [birth year] No Data Recorded  Encounter Date: 10/25/2015      PT End of Session - 10/25/15 1407    Visit Number 9   Number of Visits 16   Date for PT Re-Evaluation 11/22/15   Authorization Type [employer] : g code and recert done at visit 8   Authorization - Visit Number 9   Authorization - Number of Visits 16   PT Start Time 1200   PT Stop Time 1250   PT Time Calculation (min) 50 min   Activity Tolerance Patient tolerated treatment well   Behavior During Therapy [facility] for tasks assessed/performed      Past Medical History:  Diagnosis Date  . Chronic lung disease    on home O2   . Complete heart block (HCC)    s/p PPM by Dr Rayann Heman  . Debilitated   . Diastolic dysfunction   . Hypertension   . Obesity   . Permanent atrial fibrillation ([place])   . Pulmonary hypertension ([place])   . Rheumatoid arthritis(714.0)   . Sleep apnea   . Stroke [facility])    remote  . Venous insufficiency    with chronic leg ulcers    Past Surgical History:  Procedure Laterality Date  . PACEMAKER INSERTION  08/13/10   SJM by Dr Rayann Heman    There were no vitals filed for this visit.                  Wound Therapy - 10/25/15 1403    Subjective no complaints, doing well overall  States her breathing is worse today and her daughter is going to make her an appoitnment.   Patient and Family Stated Goals Wounds to heal once and for all.    Date of Onset 08/24/15   Prior Treatments MD, home health, home care    Wound Properties Date First Assessed: 09/24/15 Time First Assessed: 1133 Wound Type: Other (Comment) , venous stasis ulcer   Location: Leg Location Orientation: Right;Distal Wound Description (Comments): Over ten small wounds scattered around anterior  medial to anterior lateral with the largest being 2 cm diameter  Present on Admission: Yes   Dressing Type Compression wrap;Silver dressings   Dressing Changed Changed   Dressing Status Old drainage   Dressing Change Frequency PRN   Site / Wound Assessment Friable;Red   % Wound base Red or Granulating 100%  was 90%    % Wound base Yellow 0%  was 10%    Peri-wound Assessment Hemosiderin   Margins Attached edges (approximated)   Drainage Amount Moderate   Drainage Description Serosanguineous   Treatment Cleansed;Debridement (Selective)   Wound Properties Date First Assessed: 09/24/15 Time First Assessed: 1140 Wound Type: Other (Comment) Location: Leg Location Orientation: Left;Anterior Wound Description (Comments): 4 small wounds with the largest bein 1.5 x 1.0  Present on Admission: Yes   Dressing Type Compression wrap;Impregnated gauze (bismuth)   Dressing Changed Changed   Dressing Status Old drainage   Dressing Change Frequency PRN   Site / Wound Assessment Granulation tissue;Red  did have yellow at initial evaluation    % Wound base Red or Granulating 100%  inital evaluation was 90%    % Wound base Yellow 0%  initial evaluation was 10%    Peri-wound Assessment Hemosiderin  lateral aspect of leg noted redness with increased warmth.    Drainage Amount Scant   Drainage Description Sanguineous   Treatment Cleansed;Debridement (Selective)   Selective Debridement - Location bilateral LE's  multiple wound sites    Selective Debridement - Tools Used Forceps   Selective Debridement - Tissue Removed slough and dry skin    Wound Therapy - Clinical Statement Increased drainage on Rt LE with more slough removal.  Changed dressings this session to silver hydrofiber on Rt LE and medihoney gel on Lt to maintain proper healing environment.  Pt reported overall comfort.   Wound Therapy - Functional Problem List difficulty walking    Factors Delaying/Impairing Wound Healing Infection -  systemic/local;Polypharmacy;Vascular compromise   Hydrotherapy Plan Debridement;Dressing change;Patient/family education   Wound Therapy - Frequency --  Twice a week x four more weeks for a total of 8 weeks.    Wound Therapy - Current Recommendations PT  change from xeroform to silverhydrofiber. Profore to lite    Dressing  Lt LE:  medihoney gel on 2X2, profore lite    Dressing Rt LE: silver hydrofiber , profore lite.                     PT Short Term Goals - 10/23/15 1216      PT SHORT TERM GOAL #1   Title Pt to verbalize the importance of using compression every day to keep legs wound free   Time 1   Period Weeks   Status Achieved     PT SHORT TERM GOAL #2   Title Pt to state that the pain in her legs are no greater than 3/10 to allow pt to ambulate for five minutes without stopping    Time 2   Period Weeks   Status Achieved     PT SHORT TERM GOAL #3   Title Pt to have only 5 wounds on Rt LE and 2 on Lt LE    Time 2   Period Weeks   Status On-going           PT Long Term Goals - 10/23/15 1216      PT LONG TERM GOAL #1   Title Pt to be wound free to reduce risk of infection    Time 4   Period Weeks   Status On-going     PT LONG TERM GOAL #2   Title Pt to have obtained compression garment and to be able to don and doff with minimal assist from family    Time 4   Period Weeks   Status On-going             Patient will benefit from skilled therapeutic intervention in order to improve the following deficits and impairments:     Visit Diagnosis: Pain in right lower leg  Pain in left lower leg  Unsteadiness on feet  Varicose veins of right lower extremity with ulcer other part of lower leg (HCC)  Varicose veins of left lower extremity with ulcer other part of lower leg [facility])     Problem List Patient Active Problem List   Diagnosis Date Noted  . Alzheimer's disease 10/11/2014  . Essential hypertension 10/11/2014  . Cardiomyopathy,  dilated ([place]) 07/24/2014  . Apnea, sleep 07/24/2014  . Anemia 04/28/2014  . Chronic hypoxemic respiratory failure ([place]) 04/12/2014  . Chronic ulcer of leg ([place]) 04/12/2014  . Lymphedema of leg 04/12/2014  . Disorder of nutrition 04/12/2014  . Hypothyroidism 03/24/2014  . Stasis  dermatitis of both legs 05/06/2013  . Pacemaker-St.Jude 11/25/2011  . Complete heart block ([place]) 12/12/2010  . Permanent atrial fibrillation ([place]) 12/12/2010  . Diastolic dysfunction [id number]  . Venous insufficiency 12/12/2010  . Long term current use of anticoagulant 09/30/2002    Teena Irani, PTA/CLT [phone]  10/25/2015, 2:08 PM  [place], [name], [address] Phone: [phone]   Fax:  [phone]  Name: [name] MRN: [id number] Date of Birth: 05-May-1933

## 2015-10-29 ENCOUNTER — Ambulatory Visit (HOSPITAL_COMMUNITY): Payer: PPO | Admitting: Physical Therapy

## 2015-10-30 ENCOUNTER — Encounter: Payer: Self-pay | Admitting: Family Medicine

## 2015-10-30 ENCOUNTER — Ambulatory Visit (INDEPENDENT_AMBULATORY_CARE_PROVIDER_SITE_OTHER): Payer: PPO | Admitting: Family Medicine

## 2015-10-30 ENCOUNTER — Ambulatory Visit (INDEPENDENT_AMBULATORY_CARE_PROVIDER_SITE_OTHER): Payer: PPO

## 2015-10-30 ENCOUNTER — Ambulatory Visit (HOSPITAL_COMMUNITY): Payer: PPO | Attending: Family Medicine | Admitting: Physical Therapy

## 2015-10-30 VITALS — BP 111/60 | HR 66 | Temp 97.8°F | Ht 59.0 in | Wt 146.1 lb

## 2015-10-30 DIAGNOSIS — L97819 Non-pressure chronic ulcer of other part of right lower leg with unspecified severity: Secondary | ICD-10-CM

## 2015-10-30 DIAGNOSIS — M79662 Pain in left lower leg: Secondary | ICD-10-CM | POA: Insufficient documentation

## 2015-10-30 DIAGNOSIS — R2681 Unsteadiness on feet: Secondary | ICD-10-CM | POA: Diagnosis not present

## 2015-10-30 DIAGNOSIS — J329 Chronic sinusitis, unspecified: Secondary | ICD-10-CM

## 2015-10-30 DIAGNOSIS — R05 Cough: Secondary | ICD-10-CM

## 2015-10-30 DIAGNOSIS — R059 Cough, unspecified: Secondary | ICD-10-CM

## 2015-10-30 DIAGNOSIS — I83018 Varicose veins of right lower extremity with ulcer other part of lower leg: Secondary | ICD-10-CM | POA: Diagnosis not present

## 2015-10-30 DIAGNOSIS — J40 Bronchitis, not specified as acute or chronic: Secondary | ICD-10-CM | POA: Diagnosis not present

## 2015-10-30 DIAGNOSIS — L97829 Non-pressure chronic ulcer of other part of left lower leg with unspecified severity: Secondary | ICD-10-CM

## 2015-10-30 DIAGNOSIS — I83028 Varicose veins of left lower extremity with ulcer other part of lower leg: Secondary | ICD-10-CM | POA: Diagnosis not present

## 2015-10-30 DIAGNOSIS — M79661 Pain in right lower leg: Secondary | ICD-10-CM | POA: Diagnosis not present

## 2015-10-30 MED ORDER — AMOXICILLIN-POT CLAVULANATE 875-125 MG PO TABS: 1.0000 | ORAL_TABLET | Freq: Two times a day (BID) | ORAL | 0 refills | Status: DC

## 2015-10-30 NOTE — Therapy (Signed)
[place], [name], [address] Phone: [phone]   Fax:  [phone]  Wound Care Therapy  Patient Details  Name: [name] MRN: [id number] Date of Birth: [birth year] No Data Recorded  Encounter Date: 10/30/2015      PT End of Session - 10/30/15 1033    Visit Number 10   Number of Visits 16   Date for PT Re-Evaluation 11/22/15   Authorization Type [employer] : g code and recert done at visit 8   Authorization - Visit Number 10   Authorization - Number of Visits 16   PT Start Time 0900   PT Stop Time 1010   PT Time Calculation (min) 70 min   Activity Tolerance Patient tolerated treatment well   Behavior During Therapy [facility] for tasks assessed/performed      Past Medical History:  Diagnosis Date  . Chronic lung disease    on home O2   . Complete heart block (HCC)    s/p PPM by Dr Rayann Heman  . Debilitated   . Diastolic dysfunction   . Hypertension   . Obesity   . Permanent atrial fibrillation ([place])   . Pulmonary hypertension   . Rheumatoid arthritis(714.0)   . Sleep apnea   . Stroke [facility])    remote  . Venous insufficiency    with chronic leg ulcers    Past Surgical History:  Procedure Laterality Date  . PACEMAKER INSERTION  08/13/10   SJM by Dr Rayann Heman    There were no vitals filed for this visit.                  Wound Therapy - 10/30/15 1019    Subjective daughter states [name] has an appointment with her MD this afternoon for her cough and weezing.     Patient and Family Stated Goals Wounds to heal once and for all.    Date of Onset 08/24/15   Prior Treatments MD, home health, home care    Wound Properties Date First Assessed: 09/24/15 Time First Assessed: 1133 Wound Type: Other (Comment) , venous stasis ulcer   Location: Leg Location Orientation: Right;Distal Wound Description (Comments): Over ten small wounds scattered around anterior medial to anterior  lateral with the largest being 2 cm diameter  Present on Admission: Yes   Dressing Type Compression wrap;Silver dressings   Dressing Changed Changed   Dressing Status Old drainage   Dressing Change Frequency PRN   Site / Wound Assessment Friable;Red   % Wound base Red or Granulating 100%  was 90%    % Wound base Yellow 0%  was 10%    Peri-wound Assessment Hemosiderin   Margins Attached edges (approximated)   Drainage Amount Minimal   Drainage Description Serosanguineous   Treatment Cleansed;Debridement (Selective)   Wound Properties Date First Assessed: 09/24/15 Time First Assessed: 1140 Wound Type: Other (Comment) Location: Leg Location Orientation: Left;Anterior Wound Description (Comments): 4 small wounds with the largest bein 1.5 x 1.0  Present on Admission: Yes   Dressing Type Compression wrap;Impregnated gauze (bismuth)   Dressing Changed Changed   Dressing Status Old drainage   Dressing Change Frequency PRN   Site / Wound Assessment Granulation tissue;Red  did have yellow at initial evaluation    % Wound base Red or Granulating 100%  inital evaluation was 90%    % Wound base Yellow 0%  initial evaluation was 10%    Peri-wound Assessment Hemosiderin  lateral aspect of  leg noted redness with increased warmth.    Drainage Amount Scant   Drainage Description Sanguineous   Treatment Cleansed;Debridement (Selective)   Selective Debridement - Location bilateral LE's  multiple wound sites    Selective Debridement - Tools Used Forceps   Selective Debridement - Tissue Removed slough and dry skin    Wound Therapy - Clinical Statement Less drainage on Rt LE today and scant on Lt LE.  only 4 small areas remain on anterior Lt LE and approximating well.  Rt LE with less drainage, however little approximation since last visit with large wounds on anterior and posterior aspects.  changed dressings to xeroform on Rt LE due to reduced drainage and sticking on silver on areas.     Wound  Therapy - Functional Problem List difficulty walking    Factors Delaying/Impairing Wound Healing Infection - systemic/local;Polypharmacy;Vascular compromise   Hydrotherapy Plan Debridement;Dressing change;Patient/family education   Wound Therapy - Frequency --  Twice a week x four more weeks for a total of 8 weeks.    Wound Therapy - Current Recommendations PT  change from xeroform to silverhydrofiber. Profore to lite    Wound Plan Continue with irrigation, debridement and appropriate dressings using compression.   Dressing  Lt LE:  xeroform, profore lite    Dressing Rt LE: xeroform , profore lite.                     PT Short Term Goals - 10/23/15 1216      PT SHORT TERM GOAL #1   Title Pt to verbalize the importance of using compression every day to keep legs wound free   Time 1   Period Weeks   Status Achieved     PT SHORT TERM GOAL #2   Title Pt to state that the pain in her legs are no greater than 3/10 to allow pt to ambulate for five minutes without stopping    Time 2   Period Weeks   Status Achieved     PT SHORT TERM GOAL #3   Title Pt to have only 5 wounds on Rt LE and 2 on Lt LE    Time 2   Period Weeks   Status On-going           PT Long Term Goals - 10/23/15 1216      PT LONG TERM GOAL #1   Title Pt to be wound free to reduce risk of infection    Time 4   Period Weeks   Status On-going     PT LONG TERM GOAL #2   Title Pt to have obtained compression garment and to be able to don and doff with minimal assist from family    Time 4   Period Weeks   Status On-going             Patient will benefit from skilled therapeutic intervention in order to improve the following deficits and impairments:     Visit Diagnosis: Pain in right lower leg  Pain in left lower leg  Unsteadiness on feet  Varicose veins of right lower extremity with ulcer other part of lower leg (HCC)  Varicose veins of left lower extremity with ulcer other part of  lower leg [facility])     Problem List Patient Active Problem List   Diagnosis Date Noted  . Alzheimer's disease 10/11/2014  . Essential hypertension 10/11/2014  . Cardiomyopathy, dilated ([place]) 07/24/2014  . Apnea, sleep 07/24/2014  . Anemia 04/28/2014  .  Chronic hypoxemic respiratory failure ([place]) 04/12/2014  . Chronic ulcer of leg ([place]) 04/12/2014  . Lymphedema of leg 04/12/2014  . Disorder of nutrition 04/12/2014  . Hypothyroidism 03/24/2014  . Stasis dermatitis of both legs 05/06/2013  . Pacemaker-St.Jude 11/25/2011  . Complete heart block ([place]) 12/12/2010  . Permanent atrial fibrillation ([place]) 12/12/2010  . Diastolic dysfunction [id number]  . Venous insufficiency 12/12/2010  . Long term current use of anticoagulant 09/30/2002    Teena Irani, PTA/CLT [phone]  10/30/2015, 10:35 AM  [place] [address] [place], [name], [address] Phone: [phone]   Fax:  [phone]  Name: [name] MRN: [id number] Date of Birth: 07/31/1933

## 2015-10-30 NOTE — Progress Notes (Signed)
Subjective:  Patient ID: [name], female    DOB: 04-Apr-1933  Age: 82 y.o. MRN: [id number]  CC: Cough (pt here today c/o cough and runny nose)   HPI [name] presents for Some yellow sputum. Frontal headache. Cough has been profuse today. Runny nose clear to yellow intermittently/alternating.   History [name] has a past medical history of Chronic lung disease; Complete heart block ([place]); Debilitated; Diastolic dysfunction; Hypertension; Obesity; Permanent atrial fibrillation ([place]); Pulmonary hypertension; Rheumatoid arthritis(714.0); Sleep apnea; Stroke [facility]); and Venous insufficiency.   She has a past surgical history that includes Pacemaker insertion (08/13/10).   Her family history is not on file.She reports that she has never smoked. She has never used smokeless tobacco. She reports that she does not drink alcohol or use drugs.    ROS Review of Systems  Constitutional: Negative for activity change and fever.  HENT: Positive for congestion, hearing loss, postnasal drip and rhinorrhea. Negative for sore throat.   Eyes: Negative for visual disturbance.  Respiratory: Positive for cough and shortness of breath (at baseline, using oxygen today.).   Cardiovascular: Negative for chest pain and palpitations.  Gastrointestinal: Negative for abdominal pain, nausea and vomiting.    Objective:  BP 111/60   Pulse 66   Temp 97.8 F (36.6 C) (Oral)   Ht 4\' 11"  (1.499 m)   Wt 146 lb 2 oz (66.3 kg)   SpO2 97% Comment: on 4 liters of oxygen  BMI 29.51 kg/m   BP Readings from Last 3 Encounters:  10/30/15 111/60  08/24/15 122/80  07/19/15 (!) 142/69    Wt Readings from Last 3 Encounters:  10/30/15 146 lb 2 oz (66.3 kg)  08/24/15 138 lb (62.6 kg)  07/19/15 147 lb 6.4 oz (66.9 kg)     Physical Exam  Constitutional: She appears well-developed and well-nourished.  HENT:  Head: Normocephalic and atraumatic.  Right Ear: Tympanic membrane and external ear normal. No  decreased hearing is noted.  Left Ear: Tympanic membrane and external ear normal. No decreased hearing is noted.  Nose: Mucosal edema present. Right sinus exhibits no frontal sinus tenderness. Left sinus exhibits no frontal sinus tenderness.  Mouth/Throat: No oropharyngeal exudate or posterior oropharyngeal erythema.  Neck: No Brudzinski's sign noted.  Cardiovascular:  Murmur heard.  Crescendo systolic murmur is present with a grade of 3/6  Pulmonary/Chest: No respiratory distress. She has wheezes.  Lymphadenopathy:       Head (right side): No preauricular adenopathy present.       Head (left side): No preauricular adenopathy present.       Right cervical: No superficial cervical adenopathy present.      Left cervical: No superficial cervical adenopathy present.     Lab Results  Component Value Date   WBC 12.8 (H) 06/07/2015   HGB 9.2 (A) 06/01/2014   HCT 34.2 06/07/2015   PLT 378 06/07/2015   GLUCOSE 107 (H) 06/07/2015   CHOL 203 (H) 10/11/2014   TRIG 93 10/11/2014   HDL 70 10/11/2014   LDLCALC 114 (H) 10/11/2014   ALT 8 06/07/2015   AST 15 06/07/2015   NA 147 (H) 06/07/2015   K 3.5 06/07/2015   CL 97 06/07/2015   CREATININE 0.90 06/07/2015   BUN 24 06/07/2015   CO2 32 (H) 06/07/2015   TSH 1.540 06/07/2015   INR 1.26 08/12/2010    Ct Abdomen Pelvis W Wo Contrast  Chest x-ray: Cardiomegaly no acute change.  Assessment & Plan:   [name] was  seen today for cough.  Diagnoses and all orders for this visit:  Sinobronchitis  Cough -     DG Chest 2 View; Future  Other orders -     amoxicillin-clavulanate (AUGMENTIN) 875-125 MG tablet; Take 1 tablet by mouth 2 (two) times daily. Take all of this medication      I am having [name] start on amoxicillin-clavulanate. I am also having her maintain her OXYGEN-HELIUM IN, ferrous sulfate, multivitamin with minerals, traMADol, losartan, potassium chloride, furosemide, ELIQUIS, donepezil, levothyroxine, NAMENDA XR,  and simvastatin.  Meds ordered this encounter  Medications  . amoxicillin-clavulanate (AUGMENTIN) 875-125 MG tablet    Sig: Take 1 tablet by mouth 2 (two) times daily. Take all of this medication    Dispense:  20 tablet    Refill:  0     Follow-up: Return if symptoms worsen or fail to improve.  [name], M.D.

## 2015-11-01 ENCOUNTER — Ambulatory Visit (HOSPITAL_COMMUNITY): Payer: PPO | Admitting: Physical Therapy

## 2015-11-01 DIAGNOSIS — L97819 Non-pressure chronic ulcer of other part of right lower leg with unspecified severity: Secondary | ICD-10-CM

## 2015-11-01 DIAGNOSIS — L97829 Non-pressure chronic ulcer of other part of left lower leg with unspecified severity: Secondary | ICD-10-CM

## 2015-11-01 DIAGNOSIS — R2681 Unsteadiness on feet: Secondary | ICD-10-CM

## 2015-11-01 DIAGNOSIS — M79661 Pain in right lower leg: Secondary | ICD-10-CM

## 2015-11-01 DIAGNOSIS — I83028 Varicose veins of left lower extremity with ulcer other part of lower leg: Secondary | ICD-10-CM

## 2015-11-01 DIAGNOSIS — I83018 Varicose veins of right lower extremity with ulcer other part of lower leg: Secondary | ICD-10-CM

## 2015-11-01 DIAGNOSIS — M79662 Pain in left lower leg: Secondary | ICD-10-CM

## 2015-11-01 NOTE — Therapy (Signed)
[place], [name], [address] Phone: [phone]   Fax:  [phone]  Wound Care Therapy  Patient Details  Name: [name] MRN: [id number] Date of Birth: 02/02/1933 No Data Recorded  Encounter Date: 11/01/2015    Past Medical History:  Diagnosis Date  . Chronic lung disease    on home O2   . Complete heart block (HCC)    s/p PPM by Dr Rayann Heman  . Debilitated   . Diastolic dysfunction   . Hypertension   . Obesity   . Permanent atrial fibrillation ([place])   . Pulmonary hypertension   . Rheumatoid arthritis(714.0)   . Sleep apnea   . Stroke [facility])    remote  . Venous insufficiency    with chronic leg ulcers    Past Surgical History:  Procedure Laterality Date  . PACEMAKER INSERTION  08/13/10   SJM by Dr Rayann Heman    There were no vitals filed for this visit.                  Wound Therapy - 11/01/15 1147    Subjective daughter reports they went to MD who prescribed antibiotics for her cough.  currently without pain, however daughter reported increased difficulty for patient to get in and out of her car.   Patient and Family Stated Goals Wounds to heal once and for all.    Date of Onset 08/24/15   Prior Treatments MD, home health, home care    Wound Properties Date First Assessed: 09/24/15 Time First Assessed: 1133 Wound Type: Other (Comment) , venous stasis ulcer   Location: Leg Location Orientation: Right;Distal Wound Description (Comments): Over ten small wounds scattered around anterior medial to anterior lateral with the largest being 2 cm diameter  Present on Admission: Yes   Dressing Type Compression wrap;Silver dressings   Dressing Changed Changed   Dressing Status Old drainage   Dressing Change Frequency PRN   Site / Wound Assessment Friable;Red   % Wound base Red or Granulating 100%  was 90%    % Wound base Yellow 0%  was 10%    Peri-wound Assessment Hemosiderin   Margins  Attached edges (approximated)   Drainage Amount Minimal   Drainage Description Serosanguineous   Treatment Cleansed;Debridement (Selective)   Wound Properties Date First Assessed: 09/24/15 Time First Assessed: 1140 Wound Type: Other (Comment) Location: Leg Location Orientation: Left;Anterior Wound Description (Comments): 4 small wounds with the largest bein 1.5 x 1.0  Present on Admission: Yes   Dressing Type Compression wrap;Impregnated gauze (bismuth)   Dressing Changed Changed   Dressing Status Old drainage   Dressing Change Frequency PRN   Site / Wound Assessment Granulation tissue;Red  did have yellow at initial evaluation    % Wound base Red or Granulating 100%  inital evaluation was 90%    % Wound base Yellow 0%  initial evaluation was 10%    Peri-wound Assessment Hemosiderin  lateral aspect of leg noted redness with increased warmth.    Drainage Amount Scant   Drainage Description Sanguineous   Treatment Cleansed;Debridement (Selective)   Selective Debridement - Location bilateral LE's  multiple wound sites    Selective Debridement - Tools Used Forceps   Selective Debridement - Tissue Removed slough and dry skin    Wound Therapy - Clinical Statement Continued improvement in wounds, however slow.  Some new little "puss" areas on each LE but overall improving.  continued with xeroform dressings as well as  profore lite on bilateral LE's.  Instructed with LAQ and sit to stand exercises and given for HEP.l   Wound Therapy - Functional Problem List difficulty walking    Factors Delaying/Impairing Wound Healing Infection - systemic/local;Polypharmacy;Vascular compromise   Hydrotherapy Plan Debridement;Dressing change;Patient/family education   Wound Therapy - Frequency --  Twice a week x four more weeks for a total of 8 weeks.    Wound Therapy - Current Recommendations PT  change from xeroform to silverhydrofiber. Profore to lite    Wound Plan Continue with irrigation, debridement  and appropriate dressings using compression.   Dressing  Lt LE:  xeroform, profore lite    Dressing Rt LE: xeroform , profore lite.                   PT Education - 11/01/15 1152    Education provided Yes   Education Details given HEP including LAQ and sit to stand actvity.   Person(s) Educated Patient;Child(ren)   Methods Explanation;Demonstration;Tactile cues;Verbal cues;Handout   Comprehension Verbalized understanding;Returned demonstration;Verbal cues required;Tactile cues required          PT Short Term Goals - 10/23/15 1216      PT SHORT TERM GOAL #1   Title Pt to verbalize the importance of using compression every day to keep legs wound free   Time 1   Period Weeks   Status Achieved     PT SHORT TERM GOAL #2   Title Pt to state that the pain in her legs are no greater than 3/10 to allow pt to ambulate for five minutes without stopping    Time 2   Period Weeks   Status Achieved     PT SHORT TERM GOAL #3   Title Pt to have only 5 wounds on Rt LE and 2 on Lt LE    Time 2   Period Weeks   Status On-going           PT Long Term Goals - 10/23/15 1216      PT LONG TERM GOAL #1   Title Pt to be wound free to reduce risk of infection    Time 4   Period Weeks   Status On-going     PT LONG TERM GOAL #2   Title Pt to have obtained compression garment and to be able to don and doff with minimal assist from family    Time 4   Period Weeks   Status On-going             Patient will benefit from skilled therapeutic intervention in order to improve the following deficits and impairments:     Visit Diagnosis: Pain in right lower leg  Pain in left lower leg  Unsteadiness on feet  Varicose veins of right lower extremity with ulcer other part of lower leg (HCC)  Varicose veins of left lower extremity with ulcer other part of lower leg [facility])     Problem List Patient Active Problem List   Diagnosis Date Noted  . Alzheimer's disease  10/11/2014  . Essential hypertension 10/11/2014  . Cardiomyopathy, dilated ([place]) 07/24/2014  . Apnea, sleep 07/24/2014  . Anemia 04/28/2014  . Chronic hypoxemic respiratory failure ([place]) 04/12/2014  . Chronic ulcer of leg ([place]) 04/12/2014  . Lymphedema of leg 04/12/2014  . Disorder of nutrition 04/12/2014  . Hypothyroidism 03/24/2014  . Stasis dermatitis of both legs 05/06/2013  . Pacemaker-St.Jude 11/25/2011  . Complete heart block ([place]) 12/12/2010  . Permanent atrial fibrillation (  [place]) 12/12/2010  . Diastolic dysfunction XX123456  . Venous insufficiency 12/12/2010  . Long term current use of anticoagulant 09/30/2002    Teena Irani, PTA/CLT [phone]  11/01/2015, 11:53 AM  [place] [address] [place], [name], [address] Phone: [phone]   Fax:  [phone]  Name: [name] MRN: [id number] Date of Birth: 03-27-1933

## 2015-11-01 NOTE — Patient Instructions (Signed)
Functional Quadriceps: Sit to Stand    Sit on edge of chair, feet flat on floor. Stand upright, extending knees fully. Repeat __10__ times per set. Do __2__ sessions per day.   KNEE: Extension, Long Arc Quads - Sitting    Raise leg until knee is straight. _10__ reps per set, __2_ sets per day

## 2015-11-05 ENCOUNTER — Ambulatory Visit (HOSPITAL_COMMUNITY): Payer: PPO | Admitting: Physical Therapy

## 2015-11-06 ENCOUNTER — Ambulatory Visit (HOSPITAL_COMMUNITY): Payer: PPO | Admitting: Physical Therapy

## 2015-11-06 DIAGNOSIS — I83018 Varicose veins of right lower extremity with ulcer other part of lower leg: Secondary | ICD-10-CM

## 2015-11-06 DIAGNOSIS — M79661 Pain in right lower leg: Secondary | ICD-10-CM | POA: Diagnosis not present

## 2015-11-06 DIAGNOSIS — M79662 Pain in left lower leg: Secondary | ICD-10-CM

## 2015-11-06 DIAGNOSIS — I83028 Varicose veins of left lower extremity with ulcer other part of lower leg: Secondary | ICD-10-CM

## 2015-11-06 DIAGNOSIS — L97819 Non-pressure chronic ulcer of other part of right lower leg with unspecified severity: Secondary | ICD-10-CM

## 2015-11-06 DIAGNOSIS — R2681 Unsteadiness on feet: Secondary | ICD-10-CM

## 2015-11-06 DIAGNOSIS — L97829 Non-pressure chronic ulcer of other part of left lower leg with unspecified severity: Secondary | ICD-10-CM

## 2015-11-06 NOTE — Therapy (Signed)
[facility] [address] [place], [name], [address] Phone: [phone]   Fax:  [phone]  Wound Care Therapy  Patient Details  Name: [name] MRN: [id number] Date of Birth: 1933-04-02 No Data Recorded  Encounter Date: 11/06/2015      PT End of Session - 11/06/15 1153    Visit Number 11   Number of Visits 16   Date for PT Re-Evaluation 11/22/15   Authorization Type [employer] : g code and recert done at visit 8   Authorization - Visit Number 11   Authorization - Number of Visits 16   PT Start Time [id number]   PT Stop Time 1120   PT Time Calculation (min) 45 min   Activity Tolerance Patient tolerated treatment well   Behavior During Therapy [facility] for tasks assessed/performed      Past Medical History:  Diagnosis Date  . Chronic lung disease    on home O2   . Complete heart block (HCC)    s/p PPM by Dr Rayann Heman  . Debilitated   . Diastolic dysfunction   . Hypertension   . Obesity   . Permanent atrial fibrillation ([place])   . Pulmonary hypertension   . Rheumatoid arthritis(714.0)   . Sleep apnea   . Stroke [facility])    remote  . Venous insufficiency    with chronic leg ulcers    Past Surgical History:  Procedure Laterality Date  . PACEMAKER INSERTION  08/13/10   SJM by Dr Rayann Heman    There were no vitals filed for this visit.                  Wound Therapy - 11/06/15 1149    Subjective PT is doing well.  No pain in legs   Patient and Family Stated Goals Wounds to heal once and for all.    Date of Onset 08/24/15   Prior Treatments MD, home health, home care    Wound Properties Date First Assessed: 09/24/15 Time First Assessed: 1133 Wound Type: Other (Comment) , venous stasis ulcer   Location: Leg Location Orientation: Right;Distal Wound Description (Comments): Over ten small wounds scattered around anterior medial to anterior lateral with the largest being 2 cm diameter  Present on Admission: Yes    Dressing Type Compression wrap;Silver dressings   Dressing Changed Changed   Dressing Status Old drainage   Dressing Change Frequency PRN   Site / Wound Assessment Friable;Red   % Wound base Red or Granulating 100%  was 90%    % Wound base Yellow 0%  was 10%    Peri-wound Assessment Hemosiderin   Margins Attached edges (approximated)   Drainage Amount Minimal   Drainage Description Serosanguineous   Treatment Debridement (Selective);Cleansed   Wound Properties Date First Assessed: 09/24/15 Time First Assessed: 1140 Wound Type: Other (Comment) Location: Leg Location Orientation: Left;Anterior Wound Description (Comments): 4 small wounds with the largest bein 1.5 x 1.0  Present on Admission: Yes   Dressing Type Compression wrap;Impregnated gauze (bismuth)   Dressing Changed Changed   Dressing Status Old drainage   Dressing Change Frequency PRN   Site / Wound Assessment Granulation tissue;Red  did have yellow at initial evaluation    % Wound base Red or Granulating 100%  inital evaluation was 90%    % Wound base Yellow 0%  initial evaluation was 10%    Peri-wound Assessment Hemosiderin  lateral aspect of leg noted redness with increased warmth.    Drainage  Amount Scant   Drainage Description Sanguineous   Treatment Cleansed;Debridement (Selective)   Selective Debridement - Location bilateral LE's  multiple wound sites    Selective Debridement - Tools Used Forceps   Selective Debridement - Tissue Removed slough and dry skin    Wound Therapy - Clinical Statement Continued improvement in wounds, however slow.  Some new little "puss" areas on each LE but overall improving.  continued with xeroform dressings as well as profore lite on bilateral LE's.  Instructed with LAQ and sit to stand exercises and given for HEP.l   Wound Therapy - Functional Problem List Lt LE is now healed without need of dressings other than compression.  Rt LE also improving, however continues to drain alot..   Switched back to silver hydrofiber with xeroform in a few spots.  LE's moisturized well prior to rebandaging.  Added additional layer to Rt LE (profore) and continued with profore lite on Lt LE.     Factors Delaying/Impairing Wound Healing Infection - systemic/local;Polypharmacy;Vascular compromise   Hydrotherapy Plan Debridement;Dressing change;Patient/family education   Wound Therapy - Frequency --  Twice a week x four more weeks for a total of 8 weeks.    Wound Therapy - Current Recommendations PT  change from xeroform to silverhydrofiber. Profore to lite    Wound Plan Continue with irrigation, debridement and appropriate dressings using compression.   Dressing  Lt LE:  profore lite    Dressing Rt LE: silver hydro, xeroform , profore regular.                     PT Short Term Goals - 10/23/15 1216      PT SHORT TERM GOAL #1   Title Pt to verbalize the importance of using compression every day to keep legs wound free   Time 1   Period Weeks   Status Achieved     PT SHORT TERM GOAL #2   Title Pt to state that the pain in her legs are no greater than 3/10 to allow pt to ambulate for five minutes without stopping    Time 2   Period Weeks   Status Achieved     PT SHORT TERM GOAL #3   Title Pt to have only 5 wounds on Rt LE and 2 on Lt LE    Time 2   Period Weeks   Status On-going           PT Long Term Goals - 10/23/15 1216      PT LONG TERM GOAL #1   Title Pt to be wound free to reduce risk of infection    Time 4   Period Weeks   Status On-going     PT LONG TERM GOAL #2   Title Pt to have obtained compression garment and to be able to don and doff with minimal assist from family    Time 4   Period Weeks   Status On-going             Patient will benefit from skilled therapeutic intervention in order to improve the following deficits and impairments:     Visit Diagnosis: Pain in right lower leg  Pain in left lower leg  Unsteadiness on  feet  Varicose veins of right lower extremity with ulcer other part of lower leg (HCC)  Varicose veins of left lower extremity with ulcer other part of lower leg [facility])     Problem List Patient Active Problem List   Diagnosis  Date Noted  . Alzheimer's disease 10/11/2014  . Essential hypertension 10/11/2014  . Cardiomyopathy, dilated ([place]) 07/24/2014  . Apnea, sleep 07/24/2014  . Anemia 04/28/2014  . Chronic hypoxemic respiratory failure ([place]) 04/12/2014  . Chronic ulcer of leg ([place]) 04/12/2014  . Lymphedema of leg 04/12/2014  . Disorder of nutrition 04/12/2014  . Hypothyroidism 03/24/2014  . Stasis dermatitis of both legs 05/06/2013  . Pacemaker-St.Jude 11/25/2011  . Complete heart block ([place]) 12/12/2010  . Permanent atrial fibrillation ([place]) 12/12/2010  . Diastolic dysfunction [id number]  . Venous insufficiency 12/12/2010  . Long term current use of anticoagulant 09/30/2002    Teena Irani, PTA/CLT [phone]  11/06/2015, 11:54 AM  [facility] [place], [name], [address] Phone: [phone]   Fax:  [phone]  Name: [name] MRN: [id number] Date of Birth: 02/24/1933

## 2015-11-08 ENCOUNTER — Ambulatory Visit (HOSPITAL_COMMUNITY): Payer: PPO | Admitting: Physical Therapy

## 2015-11-08 ENCOUNTER — Other Ambulatory Visit: Payer: Self-pay | Admitting: Family Medicine

## 2015-11-08 DIAGNOSIS — M79661 Pain in right lower leg: Secondary | ICD-10-CM | POA: Diagnosis not present

## 2015-11-08 DIAGNOSIS — R2681 Unsteadiness on feet: Secondary | ICD-10-CM

## 2015-11-08 DIAGNOSIS — I83018 Varicose veins of right lower extremity with ulcer other part of lower leg: Secondary | ICD-10-CM

## 2015-11-08 DIAGNOSIS — L97819 Non-pressure chronic ulcer of other part of right lower leg with unspecified severity: Secondary | ICD-10-CM

## 2015-11-08 DIAGNOSIS — M79662 Pain in left lower leg: Secondary | ICD-10-CM

## 2015-11-08 DIAGNOSIS — I83028 Varicose veins of left lower extremity with ulcer other part of lower leg: Secondary | ICD-10-CM

## 2015-11-08 DIAGNOSIS — L97829 Non-pressure chronic ulcer of other part of left lower leg with unspecified severity: Secondary | ICD-10-CM

## 2015-11-08 NOTE — Therapy (Signed)
[facility] [place], [name], [address] Phone: [phone]   Fax:  [phone]  Wound Care Therapy  Patient Details  Name: [name] MRN: [id number] Date of Birth: February 17, 1933 No Data Recorded  Encounter Date: 11/08/2015      PT End of Session - 11/08/15 1143    Visit Number 12   Number of Visits 16   Date for PT Re-Evaluation 11/22/15   Authorization Type [employer] : g code and recert done at visit 8   Authorization - Visit Number 12   Authorization - Number of Visits 16   PT Start Time 1030   PT Stop Time 1125   PT Time Calculation (min) 55 min   Activity Tolerance Patient tolerated treatment well   Behavior During Therapy [facility] for tasks assessed/performed      Past Medical History:  Diagnosis Date  . Chronic lung disease    on home O2   . Complete heart block (HCC)    s/p PPM by Dr Rayann Heman  . Debilitated   . Diastolic dysfunction   . Hypertension   . Obesity   . Permanent atrial fibrillation ([place])   . Pulmonary hypertension   . Rheumatoid arthritis(714.0)   . Sleep apnea   . Stroke [facility])    remote  . Venous insufficiency    with chronic leg ulcers    Past Surgical History:  Procedure Laterality Date  . PACEMAKER INSERTION  08/13/10   SJM by Dr Rayann Heman    There were no vitals filed for this visit.                  Wound Therapy - 11/08/15 1139    Subjective Pt reports bandages were comfortable.. No pain   Patient and Family Stated Goals Wounds to heal once and for all.    Date of Onset 08/24/15   Prior Treatments MD, home health, home care    Pain Assessment No/denies pain   Wound Properties Date First Assessed: 09/24/15 Time First Assessed: 1133 Wound Type: Other (Comment) , venous stasis ulcer   Location: Leg Location Orientation: Right;Distal Wound Description (Comments): Over ten small wounds scattered around anterior medial to anterior lateral with the largest  being 2 cm diameter  Present on Admission: Yes   Dressing Type Compression wrap;Silver dressings   Dressing Changed Changed   Dressing Status Old drainage   Dressing Change Frequency PRN   Site / Wound Assessment Friable;Red   % Wound base Red or Granulating 100%  was 90%    % Wound base Yellow 0%  was 10%    Peri-wound Assessment Hemosiderin   Margins Attached edges (approximated)   Drainage Amount Minimal   Drainage Description Serosanguineous   Treatment Cleansed;Debridement (Selective)   Wound Properties Date First Assessed: 09/24/15 Time First Assessed: 1140 Wound Type: Other (Comment) Location: Leg Location Orientation: Left;Anterior Wound Description (Comments): 4 small wounds with the largest bein 1.5 x 1.0  Present on Admission: Yes   Dressing Type Compression wrap   Dressing Changed Changed   Dressing Status Dry;None   Dressing Change Frequency PRN   Site / Wound Assessment Granulation tissue;Red  did have yellow at initial evaluation    % Wound base Red or Granulating 100%  inital evaluation was 90%    % Wound base Yellow 0%  initial evaluation was 10%    Peri-wound Assessment Hemosiderin  lateral aspect of leg noted redness with increased warmth.  Drainage Amount None   Drainage Description --   Treatment Cleansed   Selective Debridement - Location Rt LE wounds, LT LE wounds all healed now  multiple wound sites    Selective Debridement - Tools Used Forceps   Selective Debridement - Tissue Removed slough and dry skin    Wound Therapy - Clinical Statement Wounds on Lt LE now healed.  cleansed and moisturized this LE well prior to reapplication of profore lite.  Rt LE with much improvement with added compression layer.  Continued with silver hydrofiber, cleansed and moisturized well.  Dead tissue and slough removed from borders and central wounds.  used regular profore on this LE to help increase compression.   Wound Therapy - Functional Problem List Lt LE is now healed  without need of dressings other than compression.  Rt LE also improving, however continues to drain alot..  Switched back to silver hydrofiber with xeroform in a few spots.  LE's moisturized well prior to rebandaging.  Added additional layer to Rt LE (profore) and continued with profore lite on Lt LE.     Factors Delaying/Impairing Wound Healing Infection - systemic/local;Polypharmacy;Vascular compromise   Hydrotherapy Plan Debridement;Dressing change;Patient/family education   Wound Therapy - Frequency --  Twice a week x four more weeks for a total of 8 weeks.    Wound Therapy - Current Recommendations PT  change from xeroform to silverhydrofiber. Profore to lite    Wound Plan Continue with irrigation, debridement and appropriate dressings using compression.   Dressing  Lt LE:  profore lite    Dressing Rt LE: silver hydrofiber, profore regular.                     PT Short Term Goals - 10/23/15 1216      PT SHORT TERM GOAL #1   Title Pt to verbalize the importance of using compression every day to keep legs wound free   Time 1   Period Weeks   Status Achieved     PT SHORT TERM GOAL #2   Title Pt to state that the pain in her legs are no greater than 3/10 to allow pt to ambulate for five minutes without stopping    Time 2   Period Weeks   Status Achieved     PT SHORT TERM GOAL #3   Title Pt to have only 5 wounds on Rt LE and 2 on Lt LE    Time 2   Period Weeks   Status On-going           PT Long Term Goals - 10/23/15 1216      PT LONG TERM GOAL #1   Title Pt to be wound free to reduce risk of infection    Time 4   Period Weeks   Status On-going     PT LONG TERM GOAL #2   Title Pt to have obtained compression garment and to be able to don and doff with minimal assist from family    Time 4   Period Weeks   Status On-going             Patient will benefit from skilled therapeutic intervention in order to improve the following deficits and  impairments:     Visit Diagnosis: Pain in right lower leg  Pain in left lower leg  Unsteadiness on feet  Varicose veins of left lower extremity with ulcer other part of lower leg ([place])  Varicose veins of right lower extremity  with ulcer other part of lower leg [facility])     Problem List Patient Active Problem List   Diagnosis Date Noted  . Alzheimer's disease 10/11/2014  . Essential hypertension 10/11/2014  . Cardiomyopathy, dilated ([place]) 07/24/2014  . Apnea, sleep 07/24/2014  . Anemia 04/28/2014  . Chronic hypoxemic respiratory failure ([place]) 04/12/2014  . Chronic ulcer of leg ([place]) 04/12/2014  . Lymphedema of leg 04/12/2014  . Disorder of nutrition 04/12/2014  . Hypothyroidism 03/24/2014  . Stasis dermatitis of both legs 05/06/2013  . Pacemaker-St.Jude 11/25/2011  . Complete heart block ([place]) 12/12/2010  . Permanent atrial fibrillation ([place]) 12/12/2010  . Diastolic dysfunction [id number]  . Venous insufficiency 12/12/2010  . Long term current use of anticoagulant 09/30/2002    Teena Irani, PTA/CLT [phone]  11/08/2015, 11:44 AM  [place], [name], [address] Phone: [phone]   Fax:  [phone]  Name: KERLINE MOREA MRN: [id number] Date of Birth: 1933/08/24

## 2015-11-13 ENCOUNTER — Ambulatory Visit (HOSPITAL_COMMUNITY): Payer: PPO | Admitting: Physical Therapy

## 2015-11-13 DIAGNOSIS — M79662 Pain in left lower leg: Secondary | ICD-10-CM

## 2015-11-13 DIAGNOSIS — M79661 Pain in right lower leg: Secondary | ICD-10-CM

## 2015-11-13 DIAGNOSIS — L97819 Non-pressure chronic ulcer of other part of right lower leg with unspecified severity: Secondary | ICD-10-CM

## 2015-11-13 DIAGNOSIS — I83018 Varicose veins of right lower extremity with ulcer other part of lower leg: Secondary | ICD-10-CM

## 2015-11-13 DIAGNOSIS — L97829 Non-pressure chronic ulcer of other part of left lower leg with unspecified severity: Secondary | ICD-10-CM

## 2015-11-13 DIAGNOSIS — R2681 Unsteadiness on feet: Secondary | ICD-10-CM

## 2015-11-13 DIAGNOSIS — I83028 Varicose veins of left lower extremity with ulcer other part of lower leg: Secondary | ICD-10-CM

## 2015-11-13 NOTE — Therapy (Signed)
[facility], [name], [address] Phone: [phone]   Fax:  [phone]  Wound Care Therapy  Patient Details  Name: [name] MRN: [id number] Date of Birth: 04-16-1933 No Data Recorded  Encounter Date: 11/13/2015    Past Medical History:  Diagnosis Date  . Chronic lung disease    on home O2   . Complete heart block (HCC)    s/p PPM by Dr Rayann Heman  . Debilitated   . Diastolic dysfunction   . Hypertension   . Obesity   . Permanent atrial fibrillation ([place])   . Pulmonary hypertension   . Rheumatoid arthritis(714.0)   . Sleep apnea   . Stroke [facility])    remote  . Venous insufficiency    with chronic leg ulcers    Past Surgical History:  Procedure Laterality Date  . PACEMAKER INSERTION  08/13/10   SJM by Dr Rayann Heman    There were no vitals filed for this visit.                  Wound Therapy - 11/13/15 1156    Subjective Pt reports bandages were comfortable.. No pain   Patient and Family Stated Goals Wounds to heal once and for all.    Date of Onset 08/24/15   Prior Treatments MD, home health, home care    Pain Assessment No/denies pain   Wound Properties Date First Assessed: 09/24/15 Time First Assessed: 1133 Wound Type: Other (Comment) , venous stasis ulcer   Location: Leg Location Orientation: Right;Distal Wound Description (Comments): Over ten small wounds scattered around anterior medial to anterior lateral with the largest being 2 cm diameter  Present on Admission: Yes   Dressing Type Compression wrap;Silver dressings   Dressing Changed Changed   Dressing Status Old drainage   Dressing Change Frequency PRN   Site / Wound Assessment Friable;Red   % Wound base Red or Granulating 100%  was 90%    % Wound base Yellow 0%  was 10%    Peri-wound Assessment Hemosiderin   Margins Attached edges (approximated)   Drainage Amount Minimal   Drainage Description Serosanguineous   Treatment  Cleansed;Debridement (Selective)   Wound Properties Date First Assessed: 09/24/15 Time First Assessed: 1140 Wound Type: Other (Comment) Location: Leg Location Orientation: Left;Anterior Wound Description (Comments): 4 small wounds with the largest bein 1.5 x 1.0  Present on Admission: Yes   Dressing Type Compression wrap   Dressing Changed Changed   Dressing Status Dry;None   Dressing Change Frequency PRN   Site / Wound Assessment Granulation tissue;Red  did have yellow at initial evaluation    % Wound base Red or Granulating 100%  inital evaluation was 90%    % Wound base Yellow 0%  initial evaluation was 10%    Peri-wound Assessment Hemosiderin  lateral aspect of leg noted redness with increased warmth.    Drainage Amount None   Treatment Cleansed   Selective Debridement - Location Rt LE wounds, LT LE wounds all healed now  multiple wound sites    Selective Debridement - Tools Used Forceps   Selective Debridement - Tissue Removed slough and dry skin    Wound Therapy - Clinical Statement Wounds continue to improve with increased approximation.  Lt LE continues to look well with only a few small "blistered" areas.  Unsure if vaseline may be causing areas.  Moisturized this session with lotion and use of profore compression system bilaterallery.    Wound  Therapy - Functional Problem List Lt LE is now healed without need of dressings other than compression.  Rt LE also improving, however continues to drain alot..  Switched back to silver hydrofiber with xeroform in a few spots.  LE's moisturized well prior to rebandaging.  Added additional layer to Rt LE (profore) and continued with profore lite on Lt LE.     Factors Delaying/Impairing Wound Healing Infection - systemic/local;Polypharmacy;Vascular compromise   Hydrotherapy Plan Debridement;Dressing change;Patient/family education   Wound Therapy - Frequency --  Twice a week x four more weeks for a total of 8 weeks.    Wound Therapy - Current  Recommendations PT  change from xeroform to silverhydrofiber. Profore to lite    Wound Plan Continue with irrigation, debridement and appropriate dressings using compression.   Dressing  Lt LE:  profore   Dressing Rt LE: silver hydrofiber, profore regular.                     PT Short Term Goals - 10/23/15 1216      PT SHORT TERM GOAL #1   Title Pt to verbalize the importance of using compression every day to keep legs wound free   Time 1   Period Weeks   Status Achieved     PT SHORT TERM GOAL #2   Title Pt to state that the pain in her legs are no greater than 3/10 to allow pt to ambulate for five minutes without stopping    Time 2   Period Weeks   Status Achieved     PT SHORT TERM GOAL #3   Title Pt to have only 5 wounds on Rt LE and 2 on Lt LE    Time 2   Period Weeks   Status On-going           PT Long Term Goals - 10/23/15 1216      PT LONG TERM GOAL #1   Title Pt to be wound free to reduce risk of infection    Time 4   Period Weeks   Status On-going     PT LONG TERM GOAL #2   Title Pt to have obtained compression garment and to be able to don and doff with minimal assist from family    Time 4   Period Weeks   Status On-going             Patient will benefit from skilled therapeutic intervention in order to improve the following deficits and impairments:     Visit Diagnosis: Pain in right lower leg  Pain in left lower leg  Unsteadiness on feet  Varicose veins of left lower extremity with ulcer other part of lower leg ([place])  Varicose veins of right lower extremity with ulcer other part of lower leg [facility])     Problem List Patient Active Problem List   Diagnosis Date Noted  . Alzheimer's disease 10/11/2014  . Essential hypertension 10/11/2014  . Cardiomyopathy, dilated ([place]) 07/24/2014  . Apnea, sleep 07/24/2014  . Anemia 04/28/2014  . Chronic hypoxemic respiratory failure ([place]) 04/12/2014  . Chronic ulcer of leg ([place])  04/12/2014  . Lymphedema of leg 04/12/2014  . Disorder of nutrition 04/12/2014  . Hypothyroidism 03/24/2014  . Stasis dermatitis of both legs 05/06/2013  . Pacemaker-St.Jude 11/25/2011  . Complete heart block ([place]) 12/12/2010  . Permanent atrial fibrillation ([place]) 12/12/2010  . Diastolic dysfunction [id number]  . Venous insufficiency 12/12/2010  . Long term current use of anticoagulant  09/30/2002    Teena Irani, PTA/CLT [phone]  11/13/2015, 12:01 PM  [facility] [address] [place], [name], [address] Phone: [phone]   Fax:  [phone]  Name: RATZY KINGSMORE MRN: [id number] Date of Birth: May 28, 1933

## 2015-11-15 ENCOUNTER — Ambulatory Visit (HOSPITAL_COMMUNITY): Payer: PPO | Admitting: Physical Therapy

## 2015-11-15 DIAGNOSIS — L97819 Non-pressure chronic ulcer of other part of right lower leg with unspecified severity: Secondary | ICD-10-CM

## 2015-11-15 DIAGNOSIS — L97829 Non-pressure chronic ulcer of other part of left lower leg with unspecified severity: Secondary | ICD-10-CM

## 2015-11-15 DIAGNOSIS — M79661 Pain in right lower leg: Secondary | ICD-10-CM | POA: Diagnosis not present

## 2015-11-15 DIAGNOSIS — I83018 Varicose veins of right lower extremity with ulcer other part of lower leg: Secondary | ICD-10-CM

## 2015-11-15 DIAGNOSIS — R2681 Unsteadiness on feet: Secondary | ICD-10-CM

## 2015-11-15 DIAGNOSIS — M79662 Pain in left lower leg: Secondary | ICD-10-CM

## 2015-11-15 DIAGNOSIS — I83028 Varicose veins of left lower extremity with ulcer other part of lower leg: Secondary | ICD-10-CM

## 2015-11-15 NOTE — Therapy (Signed)
[facility] [address] [place], [name], [address] Phone: [phone]   Fax:  [phone]  Wound Care Therapy  Patient Details  Name: [name] MRN: [id number] Date of Birth: 1933/04/12 No Data Recorded  Encounter Date: 11/15/2015      PT End of Session - 11/15/15 1217    Visit Number 14   Number of Visits 16   Date for PT Re-Evaluation 11/22/15   Authorization Type [employer] : g code and recert done at visit 8   Authorization - Visit Number 14   Authorization - Number of Visits 16   PT Start Time [id number]   PT Stop Time 1130   PT Time Calculation (min) 55 min   Activity Tolerance Patient tolerated treatment well   Behavior During Therapy [facility] for tasks assessed/performed      Past Medical History:  Diagnosis Date  . Chronic lung disease    on home O2   . Complete heart block (HCC)    s/p PPM by Dr Rayann Heman  . Debilitated   . Diastolic dysfunction   . Hypertension   . Obesity   . Permanent atrial fibrillation ([place])   . Pulmonary hypertension   . Rheumatoid arthritis(714.0)   . Sleep apnea   . Stroke [facility])    remote  . Venous insufficiency    with chronic leg ulcers    Past Surgical History:  Procedure Laterality Date  . PACEMAKER INSERTION  08/13/10   SJM by Dr Rayann Heman    There were no vitals filed for this visit.                  Wound Therapy - 11/15/15 1208    Subjective no complaints, dressings intact   Pain Assessment No/denies pain   Wound Properties Date First Assessed: 09/24/15 Time First Assessed: 1133 Wound Type: Other (Comment) , venous stasis ulcer   Location: Leg Location Orientation: Right;Distal Wound Description (Comments): Over ten small wounds scattered around anterior medial to anterior lateral with the largest being 2 cm diameter  Present on Admission: Yes   Dressing Type Compression wrap;Silver dressings   Dressing Changed Changed   Dressing Status Old drainage   Dressing Change Frequency PRN   Site / Wound Assessment Friable;Red   % Wound base Red or Granulating 100%   % Wound base Yellow 0%   Peri-wound Assessment Hemosiderin   Margins Attached edges (approximated)   Drainage Amount Minimal   Drainage Description Serosanguineous   Treatment Cleansed;Debridement (Selective)   Wound Properties Date First Assessed: 09/24/15 Time First Assessed: 1140 Wound Type: Other (Comment) Location: Leg Location Orientation: Left;Anterior Wound Description (Comments): 4 small wounds with the largest bein 1.5 x 1.0  Present on Admission: Yes   Dressing Type Compression wrap   Dressing Changed Changed   Dressing Status Dry;None   Dressing Change Frequency PRN   Site / Wound Assessment Granulation tissue;Red   % Wound base Red or Granulating 100%   % Wound base Yellow 0%   Peri-wound Assessment Hemosiderin   Drainage Amount None   Treatment Cleansed   Selective Debridement - Location Rt LE wounds, LT LE wounds all healed now   Selective Debridement - Tools Used Forceps   Selective Debridement - Tissue Removed slough and dry skin    Wound Therapy - Clinical Statement Much improved this session with change to profore vs profore light.  Also noted improvment using lotion vs vaseline.  Cleansed LE's and debrided dry  dead skin from perimeter of Rt LE.  Pt    Wound Therapy - Functional Problem List Lt LE is now healed without need of dressings other than compression.  Rt LE also improving,  in a few spots.  LE's moisturized well prior to rebandaging.  Added additional layer to Rt LE (profore) and continued with profore lite on Lt LE.     Hydrotherapy Plan Debridement;Dressing change;Patient/family education   Wound Plan Continue debridement and appropriate dressings using compression.Use lotion, not vaseline   Dressing  Lt LE:  profore   Dressing Rt LE: silver hydrofiber, profore regular.                     PT Short Term Goals - 10/23/15 1216      PT  SHORT TERM GOAL #1   Title Pt to verbalize the importance of using compression every day to keep legs wound free   Time 1   Period Weeks   Status Achieved     PT SHORT TERM GOAL #2   Title Pt to state that the pain in her legs are no greater than 3/10 to allow pt to ambulate for five minutes without stopping    Time 2   Period Weeks   Status Achieved     PT SHORT TERM GOAL #3   Title Pt to have only 5 wounds on Rt LE and 2 on Lt LE    Time 2   Period Weeks   Status On-going           PT Long Term Goals - 10/23/15 1216      PT LONG TERM GOAL #1   Title Pt to be wound free to reduce risk of infection    Time 4   Period Weeks   Status On-going     PT LONG TERM GOAL #2   Title Pt to have obtained compression garment and to be able to don and doff with minimal assist from family    Time 4   Period Weeks   Status On-going             Patient will benefit from skilled therapeutic intervention in order to improve the following deficits and impairments:     Visit Diagnosis: Pain in right lower leg  Pain in left lower leg  Unsteadiness on feet  Varicose veins of left lower extremity with ulcer other part of lower leg ([place])  Varicose veins of right lower extremity with ulcer other part of lower leg [facility])     Problem List Patient Active Problem List   Diagnosis Date Noted  . Alzheimer's disease 10/11/2014  . Essential hypertension 10/11/2014  . Cardiomyopathy, dilated ([place]) 07/24/2014  . Apnea, sleep 07/24/2014  . Anemia 04/28/2014  . Chronic hypoxemic respiratory failure ([place]) 04/12/2014  . Chronic ulcer of leg ([place]) 04/12/2014  . Lymphedema of leg 04/12/2014  . Disorder of nutrition 04/12/2014  . Hypothyroidism 03/24/2014  . Stasis dermatitis of both legs 05/06/2013  . Pacemaker-St.Jude 11/25/2011  . Complete heart block ([place]) 12/12/2010  . Permanent atrial fibrillation ([place]) 12/12/2010  . Diastolic dysfunction [id number]  . Venous insufficiency  12/12/2010  . Long term current use of anticoagulant 09/30/2002    Teena Irani, PTA/CLT [phone]  11/15/2015, 12:17 PM  [facility] [address] [place], [name], [address] Phone: [phone]   Fax:  [phone]  Name: MARJORIA MIYASHIRO MRN: [id number] Date of Birth: 01-15-1934

## 2015-11-20 ENCOUNTER — Ambulatory Visit (HOSPITAL_COMMUNITY): Payer: PPO | Admitting: Physical Therapy

## 2015-11-20 DIAGNOSIS — I83028 Varicose veins of left lower extremity with ulcer other part of lower leg: Secondary | ICD-10-CM

## 2015-11-20 DIAGNOSIS — M79661 Pain in right lower leg: Secondary | ICD-10-CM | POA: Diagnosis not present

## 2015-11-20 DIAGNOSIS — R2681 Unsteadiness on feet: Secondary | ICD-10-CM

## 2015-11-20 DIAGNOSIS — L97819 Non-pressure chronic ulcer of other part of right lower leg with unspecified severity: Secondary | ICD-10-CM

## 2015-11-20 DIAGNOSIS — M79662 Pain in left lower leg: Secondary | ICD-10-CM

## 2015-11-20 DIAGNOSIS — L97829 Non-pressure chronic ulcer of other part of left lower leg with unspecified severity: Secondary | ICD-10-CM

## 2015-11-20 DIAGNOSIS — I83018 Varicose veins of right lower extremity with ulcer other part of lower leg: Secondary | ICD-10-CM

## 2015-11-20 NOTE — Therapy (Signed)
[facility] [place], [name], [address] Phone: [phone]   Fax:  [phone]  Wound Care Therapy  Patient Details  Name: [name] MRN: [id number] Date of Birth: 05-Feb-1933 No Data Recorded  Encounter Date: 11/20/2015      PT End of Session - 11/20/15 1209    Visit Number 15   Number of Visits 16   Date for PT Re-Evaluation 11/22/15   Authorization Type [employer] : g code and recert done at visit 8   Authorization - Visit Number 15   Authorization - Number of Visits 16   PT Start Time [id number]   PT Stop Time 1135   PT Time Calculation (min) 60 min   Activity Tolerance Patient tolerated treatment well   Behavior During Therapy [facility] for tasks assessed/performed      Past Medical History:  Diagnosis Date  . Chronic lung disease    on home O2   . Complete heart block (HCC)    s/p PPM by Dr Rayann Heman  . Debilitated   . Diastolic dysfunction   . Hypertension   . Obesity   . Permanent atrial fibrillation ([place])   . Pulmonary hypertension   . Rheumatoid arthritis(714.0)   . Sleep apnea   . Stroke [facility])    remote  . Venous insufficiency    with chronic leg ulcers    Past Surgical History:  Procedure Laterality Date  . PACEMAKER INSERTION  08/13/10   SJM by Dr Rayann Heman    There were no vitals filed for this visit.                  Wound Therapy - 11/20/15 1205    Subjective no complaints, dressings intact   Patient and Family Stated Goals Wounds to heal once and for all.    Date of Onset 08/24/15   Prior Treatments MD, home health, home care    Pain Assessment No/denies pain   Wound Properties Date First Assessed: 09/24/15 Time First Assessed: 1133 Wound Type: Other (Comment) , venous stasis ulcer   Location: Leg Location Orientation: Right;Distal Wound Description (Comments): Over ten small wounds scattered around anterior medial to anterior lateral with the largest being 2 cm  diameter  Present on Admission: Yes   Dressing Type Compression wrap;Silver dressings   Dressing Changed Changed   Dressing Status Old drainage   Dressing Change Frequency PRN   Site / Wound Assessment Friable;Red   % Wound base Red or Granulating 100%   % Wound base Yellow 0%   Peri-wound Assessment Hemosiderin   Margins Attached edges (approximated)   Drainage Amount Minimal   Drainage Description Serosanguineous   Treatment Cleansed;Debridement (Selective)   Wound Properties Date First Assessed: 09/24/15 Time First Assessed: 1140 Wound Type: Other (Comment) Location: Leg Location Orientation: Left;Anterior Wound Description (Comments): 4 small wounds with the largest bein 1.5 x 1.0  Present on Admission: Yes   Dressing Type Compression wrap   Dressing Status --   Dressing Change Frequency --   Site / Wound Assessment --   % Wound base Red or Granulating --   % Wound base Yellow --   Peri-wound Assessment --   Drainage Amount --   Treatment Cleansed   Selective Debridement - Location Rt LE wounds, LT LE wounds all healed now   Selective Debridement - Tools Used Forceps   Selective Debridement - Tissue Removed slough and dry skin    Wound Therapy - Clinical  Statement Lt LE will be discontinued from treatment as it remains wound free.  Compression bandage placed on LE until family can get pt to [place] to obtain compression hose.  Rt LE has new wounds at top of compression bandage that appear to be scratches.  Pt encouraged not to scratch but to rub area as there are many bacteria under fingernails.    Wound Therapy - Functional Problem List Lt LE is now healed without need of dressings other than compression.  Rt LE also improving,  in a few spots.  LE's moisturized well prior to rebandaging.  Added additional layer to Rt LE (profore) and continued with profore lite on Lt LE.     Hydrotherapy Plan Debridement;Dressing change;Patient/family education   Wound Plan Continue debridement  and appropriate dressings using compression.Use lotion, not vaseline.  Measure wound for reassessment next treatment.    Dressing  Lt LE:  profore with extra cotton to obtain proper cone shape    Dressing RtRT: xeroform followed by profore.  Extra cotton to obtain proper cone shape.                    PT Short Term Goals - 10/23/15 1216      PT SHORT TERM GOAL #1   Title Pt to verbalize the importance of using compression every day to keep legs wound free   Time 1   Period Weeks   Status Achieved     PT SHORT TERM GOAL #2   Title Pt to state that the pain in her legs are no greater than 3/10 to allow pt to ambulate for five minutes without stopping    Time 2   Period Weeks   Status Achieved     PT SHORT TERM GOAL #3   Title Pt to have only 5 wounds on Rt LE and 2 on Lt LE    Time 2   Period Weeks   Status On-going           PT Long Term Goals - 10/23/15 1216      PT LONG TERM GOAL #1   Title Pt to be wound free to reduce risk of infection    Time 4   Period Weeks   Status On-going     PT LONG TERM GOAL #2   Title Pt to have obtained compression garment and to be able to don and doff with minimal assist from family    Time 4   Period Weeks   Status On-going               Plan - 11/20/15 1210    Clinical Impression Statement Please see above.  Lt LE will be discharged while Rt LE will continue to be seen for debridement and compression dressing    Rehab Potential Good   PT Frequency 2x / week   PT Duration 8 weeks  an additional 4 weeks    PT Treatment/Interventions ADLs/Self Care Home Management;Gait training;Stair training;Therapeutic exercise;Patient/family education;Other (comment)  debridement and multilayer compression dressing    PT Next Visit Plan continue with debridement when and  and compression dressing to Rt LE  until wounds are healed.  Discharge treatment for Lt LE.  Measure wounds on Rt LE         Patient will benefit from  skilled therapeutic intervention in order to improve the following deficits and impairments:  Pain, Increased edema, Difficulty walking, Decreased balance, Other (comment) (nonhealing wound )  Visit Diagnosis:  Pain in right lower leg  Pain in left lower leg  Unsteadiness on feet  Varicose veins of left lower extremity with ulcer other part of lower leg (HCC)  Varicose veins of right lower extremity with ulcer other part of lower leg [facility])     Problem List Patient Active Problem List   Diagnosis Date Noted  . Alzheimer's disease 10/11/2014  . Essential hypertension 10/11/2014  . Cardiomyopathy, dilated ([place]) 07/24/2014  . Apnea, sleep 07/24/2014  . Anemia 04/28/2014  . Chronic hypoxemic respiratory failure ([place]) 04/12/2014  . Chronic ulcer of leg ([place]) 04/12/2014  . Lymphedema of leg 04/12/2014  . Disorder of nutrition 04/12/2014  . Hypothyroidism 03/24/2014  . Stasis dermatitis of both legs 05/06/2013  . Pacemaker-St.Jude 11/25/2011  . Complete heart block ([place]) 12/12/2010  . Permanent atrial fibrillation ([place]) 12/12/2010  . Diastolic dysfunction [id number]  . Venous insufficiency 12/12/2010  . Long term current use of anticoagulant 09/30/2002  Rayetta Humphrey, PT CLT [phone] 11/20/2015, 12:12 PM  [facility] [address] [place], [name], [address] Phone: [phone]   Fax:  [phone]  Name: [name] MRN: [id number] Date of Birth: Jul 03, 1933

## 2015-11-22 ENCOUNTER — Ambulatory Visit (HOSPITAL_COMMUNITY): Payer: PPO | Admitting: Physical Therapy

## 2015-11-22 DIAGNOSIS — L97819 Non-pressure chronic ulcer of other part of right lower leg with unspecified severity: Secondary | ICD-10-CM

## 2015-11-22 DIAGNOSIS — I83018 Varicose veins of right lower extremity with ulcer other part of lower leg: Secondary | ICD-10-CM

## 2015-11-22 DIAGNOSIS — R2681 Unsteadiness on feet: Secondary | ICD-10-CM

## 2015-11-22 DIAGNOSIS — M79661 Pain in right lower leg: Secondary | ICD-10-CM

## 2015-11-22 DIAGNOSIS — L97829 Non-pressure chronic ulcer of other part of left lower leg with unspecified severity: Secondary | ICD-10-CM

## 2015-11-22 DIAGNOSIS — M79662 Pain in left lower leg: Secondary | ICD-10-CM

## 2015-11-22 DIAGNOSIS — I83028 Varicose veins of left lower extremity with ulcer other part of lower leg: Secondary | ICD-10-CM

## 2015-11-22 NOTE — Therapy (Signed)
[facility] [place], [name], [address] Phone: [phone]   Fax:  [phone]  Wound Care Therapy  Patient Details  Name: [name] MRN: [id number] Date of Birth: [birth year] No Data Recorded  Encounter Date: 11/22/2015      PT End of Session - 11/22/15 1209    Visit Number 16   Number of Visits 20   Date for PT Re-Evaluation 12/22/15   Authorization Type [employer] : g code and recert done at visit 16   Authorization - Visit Number 16   Authorization - Number of Visits 20   PT Start Time 4944   PT Stop Time 1138   PT Time Calculation (min) 58 min   Activity Tolerance Patient tolerated treatment well   Behavior During Therapy [facility] for tasks assessed/performed      Past Medical History:  Diagnosis Date  . Chronic lung disease    on home O2   . Complete heart block (HCC)    s/p PPM by Dr Rayann Heman  . Debilitated   . Diastolic dysfunction   . Hypertension   . Obesity   . Permanent atrial fibrillation ([place])   . Pulmonary hypertension   . Rheumatoid arthritis(714.0)   . Sleep apnea   . Stroke [facility])    remote  . Venous insufficiency    with chronic leg ulcers    Past Surgical History:  Procedure Laterality Date  . PACEMAKER INSERTION  08/13/10   SJM by Dr Rayann Heman    There were no vitals filed for this visit.                  Wound Therapy - 11/22/15 1201    Subjective no complaints, dressings intact   Patient and Family Stated Goals Wounds to heal once and for all.    Date of Onset 08/24/15   Prior Treatments MD, home health, home care    Pain Assessment No/denies pain   Wound Properties Date First Assessed: 09/24/15 Time First Assessed: 1133 Wound Type: Other (Comment) , venous stasis ulcer   Location: Leg Location Orientation: Right;Distal Wound Description (Comments): Over ten small wounds scattered around anterior medial to anterior lateral with the largest being 2 cm  diameter  Present on Admission: Yes   Dressing Type Compression wrap;Silver dressings   Dressing Changed Changed   Dressing Status Old drainage   Dressing Change Frequency PRN   Site / Wound Assessment Friable;Red   % Wound base Red or Granulating 100%   % Wound base Yellow 0%   Peri-wound Assessment Hemosiderin   Margins Attached edges (approximated)   Drainage Amount Minimal   Drainage Description Serosanguineous   Treatment Cleansed;Debridement (Selective)   Wound Properties Date First Assessed: 09/24/15 Time First Assessed: 1140 Wound Type: Other (Comment) Location: Leg Location Orientation: Left;Anterior Wound Description (Comments): 4 small wounds with the largest bein 1.5 x 1.0  Present on Admission: Yes   Dressing Type Compression wrap   Selective Debridement - Location Rt LE wounds, LT LE wounds all healed now   Selective Debridement - Tools Used Forceps   Selective Debridement - Tissue Removed slough and dry skin    Wound Therapy - Clinical Statement We are currently only treating the right LE as the wounds on pt Lt LE have healed.  There are 6 remaining wounds on pt Rt LE none of which have any depth.  On the medial side of pt LE there are three wounds vertically in  a row.  The most superior is 2x1.5, middle is 1.2 cm diameter and the most inferior is .5 cm diameter.  The largest wound is located anterior and superior on the LE and measures 6.1 x 2.5 cm; there is a surperior lateral wound measuring 2.5 x 1.5 cm and a posterior lateral wound that measures 2.2 x 1.5 cm.    Wound Therapy - Functional Problem List Lt LE is now healed without need of dressings other than compression.  Rt LE also improving,  in a few spots.  LE's moisturized well prior to rebandaging.  Added additional layer to Rt LE (profore) and continued with profore lite on Lt LE.     Hydrotherapy Plan Debridement;Dressing change;Patient/family education   Wound Plan decrease to one time a week for the next four weeks.     Dressing  No treatment to LT LE    Dressing RtRT: xeroform followed by profore.  Extra cotton to obtain proper cone shape.                    PT Short Term Goals - 11/22/15 1211      PT SHORT TERM GOAL #1   Title Pt to verbalize the importance of using compression every day to keep legs wound free   Time 1   Period Weeks   Status Achieved     PT SHORT TERM GOAL #2   Title Pt to state that the pain in her legs are no greater than 3/10 to allow pt to ambulate for five minutes without stopping    Time 2   Period Weeks   Status Achieved     PT SHORT TERM GOAL #3   Title Pt to have only 5 wounds on Rt LE and 2 on Lt LE    Time 2   Period Weeks   Status Partially Met           PT Long Term Goals - 11/22/15 1212      PT LONG TERM GOAL #1   Title Pt to be wound free to reduce risk of infection    Time 4   Period Weeks   Status On-going     PT LONG TERM GOAL #2   Title Pt to have obtained compression garment and to be able to don and doff with minimal assist from family    Time 4   Period Weeks   Status On-going               Plan - 11/22/15 1210    Clinical Impression Statement Please see above. We will decrease to once a week for wound care on the right LE    Rehab Potential Good   PT Frequency 2x / week   PT Duration 8 weeks  followed by one time a week for four weeks    PT Treatment/Interventions ADLs/Self Care Home Management;Gait training;Stair training;Therapeutic exercise;Patient/family education;Other (comment)  debridement and multilayer compression dressing    PT Next Visit Plan continue with debridement when and  and compression dressing to Rt LE  until wounds are healed.  Discharge treatment for Lt LE        Patient will benefit from skilled therapeutic intervention in order to improve the following deficits and impairments:  Pain, Increased edema, Difficulty walking, Decreased balance, Other (comment) (nonhealing wound )  Visit  Diagnosis: Pain in right lower leg  Pain in left lower leg  Unsteadiness on feet  Varicose veins of left lower  extremity with ulcer other part of lower leg ([place])  Varicose veins of right lower extremity with ulcer other part of lower leg ([place])      G-Codes - 12/05/2015 1212    Functional Limitation Other PT primary   Other PT Primary Current Status (L0761) At least 20 percent but less than 40 percent impaired, limited or restricted   Other PT Primary Goal Status (H1834) At least 20 percent but less than 40 percent impaired, limited or restricted       Problem List Patient Active Problem List   Diagnosis Date Noted  . Alzheimer's disease 10/11/2014  . Essential hypertension 10/11/2014  . Cardiomyopathy, dilated ([place]) 07/24/2014  . Apnea, sleep 07/24/2014  . Anemia 04/28/2014  . Chronic hypoxemic respiratory failure ([place]) 04/12/2014  . Chronic ulcer of leg ([place]) 04/12/2014  . Lymphedema of leg 04/12/2014  . Disorder of nutrition 04/12/2014  . Hypothyroidism 03/24/2014  . Stasis dermatitis of both legs 05/06/2013  . Pacemaker-St.Jude 11/25/2011  . Complete heart block ([place]) 12/12/2010  . Permanent atrial fibrillation ([place]) 12/12/2010  . Diastolic dysfunction 37/35/7897  . Venous insufficiency 12/12/2010  . Long term current use of anticoagulant 09/30/2002   Rayetta Humphrey, PT CLT [phone] 2015-12-05, 12:14 PM  [facility] [address] [place], [name], [address] Phone: [phone]   Fax:  [phone]  Name: LATICIA VANNOSTRAND MRN: [id number] Date of Birth: 07/22/1933

## 2015-11-24 DIAGNOSIS — J449 Chronic obstructive pulmonary disease, unspecified: Secondary | ICD-10-CM | POA: Diagnosis not present

## 2015-11-26 ENCOUNTER — Ambulatory Visit (INDEPENDENT_AMBULATORY_CARE_PROVIDER_SITE_OTHER): Payer: PPO | Admitting: *Deleted

## 2015-11-26 ENCOUNTER — Encounter: Payer: PPO | Admitting: *Deleted

## 2015-11-26 DIAGNOSIS — I442 Atrioventricular block, complete: Secondary | ICD-10-CM

## 2015-11-26 LAB — CUP PACEART REMOTE DEVICE CHECK
Date Time Interrogation Session: 20171218111933
Implantable Lead Implant Date: 20120717
Implantable Pulse Generator Implant Date: 20120717
Lead Channel Setting Pacing Amplitude: 2.5 V
Lead Channel Setting Pacing Pulse Width: 0.4 ms
MDC IDC LEAD LOCATION: 753860
MDC IDC LEAD MODEL: 1948
MDC IDC SET LEADCHNL RV SENSING SENSITIVITY: 2.5 mV
Pulse Gen Model: 1210
Pulse Gen Serial Number: 7245638

## 2015-11-27 ENCOUNTER — Ambulatory Visit (HOSPITAL_COMMUNITY): Payer: PPO | Admitting: Physical Therapy

## 2015-11-29 ENCOUNTER — Ambulatory Visit (HOSPITAL_COMMUNITY): Payer: PPO | Attending: Family Medicine | Admitting: Physical Therapy

## 2015-11-29 DIAGNOSIS — L97819 Non-pressure chronic ulcer of other part of right lower leg with unspecified severity: Secondary | ICD-10-CM

## 2015-11-29 DIAGNOSIS — M79661 Pain in right lower leg: Secondary | ICD-10-CM | POA: Insufficient documentation

## 2015-11-29 DIAGNOSIS — L97829 Non-pressure chronic ulcer of other part of left lower leg with unspecified severity: Secondary | ICD-10-CM

## 2015-11-29 DIAGNOSIS — R2681 Unsteadiness on feet: Secondary | ICD-10-CM | POA: Insufficient documentation

## 2015-11-29 DIAGNOSIS — I83028 Varicose veins of left lower extremity with ulcer other part of lower leg: Secondary | ICD-10-CM | POA: Insufficient documentation

## 2015-11-29 DIAGNOSIS — I83018 Varicose veins of right lower extremity with ulcer other part of lower leg: Secondary | ICD-10-CM | POA: Diagnosis not present

## 2015-11-29 DIAGNOSIS — M79662 Pain in left lower leg: Secondary | ICD-10-CM | POA: Insufficient documentation

## 2015-11-29 NOTE — Therapy (Signed)
[place], [name], [address] Phone: [phone]   Fax:  [phone]  Wound Care Therapy  Patient Details  Name: [name] MRN: [id number] Date of Birth: August 11, 1933 Referring Provider: [name]   Encounter Date: 11/29/2015      PT End of Session - 11/29/15 1432    Visit Number 17   Number of Visits 20   Date for PT Re-Evaluation 12/22/15   Authorization Type [employer] : g code and recert done at visit 16   Authorization - Visit Number 17   Authorization - Number of Visits 20   PT Start Time 3818   PT Stop Time 1125   PT Time Calculation (min) 50 min   Activity Tolerance Patient tolerated treatment well   Behavior During Therapy [facility] for tasks assessed/performed      Past Medical History:  Diagnosis Date  . Chronic lung disease    on home O2   . Complete heart block (HCC)    s/p PPM by Dr Rayann Heman  . Debilitated   . Diastolic dysfunction   . Hypertension   . Obesity   . Permanent atrial fibrillation ([place])   . Pulmonary hypertension   . Rheumatoid arthritis(714.0)   . Sleep apnea   . Stroke [facility])    remote  . Venous insufficiency    with chronic leg ulcers    Past Surgical History:  Procedure Laterality Date  . PACEMAKER INSERTION  08/13/10   SJM by Dr Rayann Heman    There were no vitals filed for this visit.                  Wound Therapy - 11/29/15 1419    Subjective Daughter reports she did not make it to the compression outlet last week due to illness.  States they plan on going tomorrow.     Patient and Family Stated Goals Wounds to heal once and for all.    Date of Onset 08/24/15   Prior Treatments MD, home health, home care    Pain Assessment No/denies pain   Wound Properties Date First Assessed: 09/24/15 Time First Assessed: 1133 Wound Type: Other (Comment) , venous stasis ulcer   Location: Leg Location Orientation: Right;Distal Wound Description  (Comments): Over ten small wounds scattered around anterior medial to anterior lateral with the largest being 2 cm diameter  Present on Admission: Yes   Dressing Type Compression wrap;Silver dressings   Dressing Changed Changed   Dressing Status Old drainage   Dressing Change Frequency PRN   Site / Wound Assessment Friable;Red   % Wound base Red or Granulating 100%   % Wound base Yellow 0%   Peri-wound Assessment Hemosiderin   Margins Attached edges (approximated)   Drainage Amount Minimal   Drainage Description Serosanguineous   Treatment Cleansed;Debridement (Selective)   Wound Properties Date First Assessed: 09/24/15 Time First Assessed: 1140 Wound Type: Other (Comment) Location: Leg Location Orientation: Left;Anterior Wound Description (Comments): 4 small wounds with the largest bein 1.5 x 1.0  Present on Admission: Yes   Dressing Type Compression wrap   Treatment Cleansed   Selective Debridement - Location Rt LE wounds   Selective Debridement - Tools Used Forceps   Selective Debridement - Tissue Removed slough and dry skin    Wound Therapy - Clinical Statement Changed profore on Lt LE today with only some noted tenderness along lateral leg suspect from xeroform.  Rt LE with increased moisture today,  Changed  back to silver hydrofiber today as pateint is only getting weekly dressing changes and moisture tends to aggrevate skin.  Cleansed both LE's well and moisturized with lotion prior to rebandaging.  Removed slough and dead skin from Rt LE wounds.  Profore used on both LE's today.   Wound Therapy - Functional Problem List Lt LE is now healed without need of dressings other than compression.  Rt LE also improving,  in a few spots.  LE's moisturized well prior to rebandaging.  Added additional layer to Rt LE (profore) and continued with profore lite on Lt LE.     Hydrotherapy Plan Debridement;Dressing change;Patient/family education   Wound Plan decrease to one time a week for the next four  weeks. Should have compression garments tomorrow.   Dressing  Lt:  Profore   Dressing RT: xeroform followed by profore.  Extra cotton to obtain proper cone shape.                    PT Short Term Goals - 11/22/15 1211      PT SHORT TERM GOAL #1   Title Pt to verbalize the importance of using compression every day to keep legs wound free   Time 1   Period Weeks   Status Achieved     PT SHORT TERM GOAL #2   Title Pt to state that the pain in her legs are no greater than 3/10 to allow pt to ambulate for five minutes without stopping    Time 2   Period Weeks   Status Achieved     PT SHORT TERM GOAL #3   Title Pt to have only 5 wounds on Rt LE and 2 on Lt LE    Time 2   Period Weeks   Status Partially Met           PT Long Term Goals - 11/22/15 1212      PT LONG TERM GOAL #1   Title Pt to be wound free to reduce risk of infection    Time 4   Period Weeks   Status On-going     PT LONG TERM GOAL #2   Title Pt to have obtained compression garment and to be able to don and doff with minimal assist from family    Time 4   Period Weeks   Status On-going             Patient will benefit from skilled therapeutic intervention in order to improve the following deficits and impairments:     Visit Diagnosis: Pain in right lower leg  Pain in left lower leg  Unsteadiness on feet  Varicose veins of left lower extremity with ulcer other part of lower leg ([place])  Varicose veins of right lower extremity with ulcer other part of lower leg [facility])     Problem List Patient Active Problem List   Diagnosis Date Noted  . Alzheimer's disease 10/11/2014  . Essential hypertension 10/11/2014  . Cardiomyopathy, dilated ([place]) 07/24/2014  . Apnea, sleep 07/24/2014  . Anemia 04/28/2014  . Chronic hypoxemic respiratory failure ([place]) 04/12/2014  . Chronic ulcer of leg ([place]) 04/12/2014  . Lymphedema of leg 04/12/2014  . Disorder of nutrition 04/12/2014  .  Hypothyroidism 03/24/2014  . Stasis dermatitis of both legs 05/06/2013  . Pacemaker-St.Jude 11/25/2011  . Complete heart block ([place]) 12/12/2010  . Permanent atrial fibrillation ([place]) 12/12/2010  . Diastolic dysfunction 89/16/9450  . Venous insufficiency 12/12/2010  . Long term current use  of anticoagulant 09/30/2002    Teena Irani, PTA/CLT [phone]  11/29/2015, 2:34 PM  [place] [address] [place], [name], [address] Phone: [phone]   Fax:  [phone]  Name: BENELLI WINTHER MRN: [id number] Date of Birth: [birth year]

## 2015-11-30 ENCOUNTER — Telehealth: Payer: Self-pay | Admitting: Family Medicine

## 2015-11-30 ENCOUNTER — Encounter: Payer: Self-pay | Admitting: Cardiology

## 2015-11-30 NOTE — Progress Notes (Unsigned)
Letter  

## 2015-12-04 ENCOUNTER — Ambulatory Visit (HOSPITAL_COMMUNITY): Payer: PPO | Admitting: Physical Therapy

## 2015-12-04 NOTE — Progress Notes (Signed)
Remote pacemaker transmission.   

## 2015-12-05 ENCOUNTER — Encounter: Payer: Self-pay | Admitting: Cardiology

## 2015-12-07 ENCOUNTER — Ambulatory Visit (HOSPITAL_COMMUNITY): Payer: PPO | Admitting: Physical Therapy

## 2015-12-07 DIAGNOSIS — R2681 Unsteadiness on feet: Secondary | ICD-10-CM

## 2015-12-07 DIAGNOSIS — M79661 Pain in right lower leg: Secondary | ICD-10-CM | POA: Diagnosis not present

## 2015-12-07 DIAGNOSIS — I83018 Varicose veins of right lower extremity with ulcer other part of lower leg: Secondary | ICD-10-CM

## 2015-12-07 DIAGNOSIS — L97819 Non-pressure chronic ulcer of other part of right lower leg with unspecified severity: Secondary | ICD-10-CM

## 2015-12-07 NOTE — Therapy (Addendum)
[place], [name], [address] Phone: [phone]   Fax:  [phone]  Wound Care Therapy  Patient Details  Name: [name] MRN: [id number] Date of Birth: 1933-04-24 Referring Provider: [name]   Encounter Date: 12/07/2015      PT End of Session - 12/07/15 1017    Visit Number 18   Number of Visits 24   Date for PT Re-Evaluation 12/22/15   Authorization Type [employer] : g code and recert done at visit 16; recert good until 69/67/8938   Authorization - Visit Number 18   Authorization - Number of Visits 24   PT Start Time 0910   PT Stop Time 0954   PT Time Calculation (min) 44 min   Activity Tolerance Patient tolerated treatment well   Behavior During Therapy [facility] for tasks assessed/performed      Past Medical History:  Diagnosis Date  . Chronic lung disease    on home O2   . Complete heart block (HCC)    s/p PPM by Dr Rayann Heman  . Debilitated   . Diastolic dysfunction   . Hypertension   . Obesity   . Permanent atrial fibrillation ([place])   . Pulmonary hypertension   . Rheumatoid arthritis(714.0)   . Sleep apnea   . Stroke [facility])    remote  . Venous insufficiency    with chronic leg ulcers    Past Surgical History:  Procedure Laterality Date  . PACEMAKER INSERTION  08/13/10   SJM by Dr Rayann Heman               Wound Therapy - 12/07/15 1008    Subjective Pt has recieved her compression garment and has no issues.  Lt LE remains healed.  Rt LE is itching    Patient and Family Stated Goals wounds to heal   Date of Onset 08/24/15   Pain Assessment No/denies pain   Wound Properties Date First Assessed: 09/24/15 Time First Assessed: 1133 Wound Type: Other (Comment) , venous stasis ulcer   Location: Leg Location Orientation: Right;Distal Wound Description (Comments): Over ten small wounds scattered around anterior medial to anterior lateral with the largest being 2 cm diameter   Present on Admission: Yes   Dressing Type Compression wrap  with honey collide dressing    Dressing Changed Changed   Dressing Status Old drainage   Dressing Change Frequency PRN   Site / Wound Assessment Friable;Red   % Wound base Red or Granulating 100%   % Wound base Yellow/Fibrinous Exudate 0%   Margins Attached edges (approximated)   Drainage Amount Minimal   Drainage Description Serosanguineous   Treatment Cleansed;Debridement (Selective)   Wound Properties Date First Assessed: 09/24/15 Time First Assessed: 1140 Wound Type: Other (Comment) Location: Leg Location Orientation: Left;Anterior Wound Description (Comments): 4 small wounds with the largest bein 1.5 x 1.0  Present on Admission: Yes Final Assessment Date: 12/07/15 Final Assessment Time: 0931   Selective Debridement - Location Rt LE wounds    Selective Debridement - Tools Used Forceps   Selective Debridement - Tissue Removed slough and dry skin   Wound Therapy - Clinical Statement Pt Lt LE treatment will be discontinued.  Pt continues to have multiple anterior and lateral wounds on her right LE.  Increased maceration with honey but dressing sticking significantly with silverhydrofiber therefore therapist changed dressing to honey colloid sheet.  Therapist also send prescription for wounds to be cultured as daughter states that they have  never been cultured before.     Hydrotherapy Plan Debridement;Dressing change   Wound Plan Discharge Lt LE treatment.  Culture Rt LE if order has returned.  Assess how colloid dressing does for pt.     Dressing  Rt:  honey collloid dressing followed by profore.                    PT Short Term Goals - 11/22/15 1211      PT SHORT TERM GOAL #1   Title Pt to verbalize the importance of using compression every day to keep legs wound free   Time 1   Period Weeks   Status Achieved     PT SHORT TERM GOAL #2   Title Pt to state that the pain in her legs are no greater than 3/10 to  allow pt to ambulate for five minutes without stopping    Time 2   Period Weeks   Status Achieved     PT SHORT TERM GOAL #3   Title Pt to have only 5 wounds on Rt LE and 2 on Lt LE    Time 2   Period Weeks   Status Partially Met           PT Long Term Goals - 11/22/15 1212      PT LONG TERM GOAL #1   Title Pt to be wound free to reduce risk of infection    Time 4   Period Weeks   Status On-going     PT LONG TERM GOAL #2   Title Pt to have obtained compression garment and to be able to don and doff with minimal assist from family    Time 4   Period Weeks   Status On-going               Plan - 12/07/15 1018    Clinical Impression Statement see above   Rehab Potential Good   PT Frequency 2x / week   PT Duration 12 weeks  followed by one time a week for four weeks    PT Treatment/Interventions ADLs/Self Care Home Management;Gait training;Stair training;Therapeutic exercise;Patient/family education;Other (comment)  debridement and multilayer compression dressing    PT Next Visit Plan continue with debridement   and compression dressing to Rt LE  until wounds are healed.  Discharge treatment for Lt LE        Patient will benefit from skilled therapeutic intervention in order to improve the following deficits and impairments:  Pain, Increased edema, Difficulty walking, Decreased balance, Other (comment) (nonhealing wound )  Visit Diagnosis: Unsteadiness on feet  Varicose veins of right lower extremity with ulcer other part of lower leg [facility])     Problem List Patient Active Problem List   Diagnosis Date Noted  . Alzheimer's disease 10/11/2014  . Essential hypertension 10/11/2014  . Cardiomyopathy, dilated ([place]) 07/24/2014  . Apnea, sleep 07/24/2014  . Anemia 04/28/2014  . Chronic hypoxemic respiratory failure ([place]) 04/12/2014  . Chronic ulcer of leg ([place]) 04/12/2014  . Lymphedema of leg 04/12/2014  . Disorder of nutrition 04/12/2014  . Hypothyroidism  03/24/2014  . Stasis dermatitis of both legs 05/06/2013  . Pacemaker-St.Jude 11/25/2011  . Complete heart block ([place]) 12/12/2010  . Permanent atrial fibrillation ([place]) 12/12/2010  . Diastolic dysfunction 40/08/6759  . Venous insufficiency 12/12/2010  . Long term current use of anticoagulant 09/30/2002   Rayetta Humphrey, PT CLT [phone] 12/07/2015, 10:19 AM  [place] [address] [place], [name], [address] Phone: [phone]   Fax:  [phone]  Name: [name] MRN: [id number] Date of Birth: 04-Dec-1933

## 2015-12-10 ENCOUNTER — Ambulatory Visit (INDEPENDENT_AMBULATORY_CARE_PROVIDER_SITE_OTHER): Payer: PPO | Admitting: Nurse Practitioner

## 2015-12-10 ENCOUNTER — Encounter: Payer: Self-pay | Admitting: Nurse Practitioner

## 2015-12-10 VITALS — BP 128/70 | HR 58 | Temp 98.9°F | Ht 59.0 in | Wt 146.0 lb

## 2015-12-10 DIAGNOSIS — J209 Acute bronchitis, unspecified: Secondary | ICD-10-CM

## 2015-12-10 DIAGNOSIS — K642 Third degree hemorrhoids: Secondary | ICD-10-CM | POA: Diagnosis not present

## 2015-12-10 MED ORDER — HYDROCORTISONE 2.5 % RE CREA: 1.0000 "application " | TOPICAL_CREAM | Freq: Two times a day (BID) | RECTAL | 0 refills | Status: AC

## 2015-12-10 MED ORDER — AZITHROMYCIN 500 MG PO TABS
ORAL_TABLET | ORAL | 0 refills | Status: DC
Start: 2015-12-10 — End: 2015-12-19

## 2015-12-10 NOTE — Patient Instructions (Signed)

## 2015-12-10 NOTE — Progress Notes (Signed)
Subjective:     [name] is a 82 y.o. female here for evaluation of a cough. Onset of symptoms was 3 days ago. Symptoms have been gradually worsening since that time. The cough is barky, hoarse and productive and is aggravated by nothing. Associated symptoms include: change in voice and fever. Patient does not have a history of asthma. Patient does have a history of environmental allergens. Patient has not traveled recently. Patient does not have a history of smoking. Patient has had a previous chest x-ray. Patient has not had a PPD done. - hemorrhoids- had some blood in panties- has external hemorrhoids- strains to have bowel movement.  The following portions of the patient's history were reviewed and updated as appropriate: allergies, current medications, past family history, past medical history, past social history, past surgical history and problem list.  Review of Systems Pertinent items noted in HPI and remainder of comprehensive ROS otherwise negative.    Objective:    Oxygen saturation 96%% on 2l% O2 via Patient connected to nasal cannula oxygen BP 128/70   Pulse (!) 58   Temp 98.9 F (37.2 C) (Oral)   Ht 4\' 11"  (1.499 m)   Wt 146 lb (66.2 kg)   BMI 29.49 kg/m  General appearance: alert and cooperative Eyes: conjunctivae/corneas clear. PERRL, EOM's intact. Fundi benign. Ears: normal TM's and external ear canals both ears Nose: clear discharge, moderate congestion, turbinates red, no sinus tenderness Throat: lips, mucosa, and tongue normal; teeth and gums normal Neck: no adenopathy, no carotid bruit, no JVD, supple, symmetrical, trachea midline and thyroid not enlarged, symmetric, no tenderness/mass/nodules Lungs: wheezes bilaterally Heart: normal apical impulse, regular rate and rhythm and systolic murmur: early systolic 2/6, crescendo at 2nd left intercostal space  rectal- 2 nonthrmbosed external hemorrhoids- with stool stain in depends   Assessment:    Acute  Bronchitis and hemorrhoids   Plan:   1. Acute bronchitis, unspecified organism .1. Take meds as prescribed 2. Use a cool mist humidifier especially during the winter months and when heat has been humid. 3. Use saline nose sprays frequently 4. Saline irrigations of the nose can be very helpful if done frequently.  * 4X daily for 1 week*  * Use of a nettie pot can be helpful with this. Follow directions with this* 5. Drink plenty of fluids 6. Keep thermostat turn down low 7.For any cough or congestion  Use plain Mucinex- regular strength or max strength is fine   * Children- consult with Pharmacist for dosing 8. For fever or aces or pains- take tylenol or ibuprofen appropriate for age and weight.  * for fevers greater than 101 orally you may alternate ibuprofen and tylenol every  3 hours.   - azithromycin (ZITHROMAX) 500 MG tablet; As directed  Dispense: 6 tablet; Refill: 0  2. Grade III hemorrhoids Stool softner Wet wipes to get rectal area clean - hydrocortisone (PROCTOZONE-HC) 2.5 % rectal cream; Place 1 application rectally 2 (two) times daily.  Dispense: 30 g; Refill: 0  RTO prn  Mary-Margaret Hassell Done, FNP

## 2015-12-11 ENCOUNTER — Ambulatory Visit (HOSPITAL_COMMUNITY): Payer: PPO | Admitting: Physical Therapy

## 2015-12-13 ENCOUNTER — Ambulatory Visit (HOSPITAL_COMMUNITY): Payer: PPO | Admitting: Physical Therapy

## 2015-12-13 DIAGNOSIS — M79661 Pain in right lower leg: Secondary | ICD-10-CM

## 2015-12-13 DIAGNOSIS — R2681 Unsteadiness on feet: Secondary | ICD-10-CM

## 2015-12-13 DIAGNOSIS — L97819 Non-pressure chronic ulcer of other part of right lower leg with unspecified severity: Secondary | ICD-10-CM

## 2015-12-13 DIAGNOSIS — I83018 Varicose veins of right lower extremity with ulcer other part of lower leg: Secondary | ICD-10-CM

## 2015-12-13 NOTE — Therapy (Signed)
[place], [name], [address] Phone: [phone]   Fax:  [phone]  Wound Care Therapy  Patient Details  Name: [name] MRN: [id number] Date of Birth: [birth year] Referring Provider: [name]   Encounter Date: 12/13/2015      PT End of Session - 12/13/15 1253    Visit Number 19   Number of Visits 20   Date for PT Re-Evaluation 12/22/15   Authorization Type [employer] : g code and recert done at visit 16; recert good until 37/90/2409   Authorization - Visit Number 4   Authorization - Number of Visits 26   PT Start Time 0955   PT Stop Time 1030   PT Time Calculation (min) 35 min   Activity Tolerance Patient tolerated treatment well   Behavior During Therapy [facility] for tasks assessed/performed      Past Medical History:  Diagnosis Date  . Chronic lung disease    on home O2   . Complete heart block (HCC)    s/p PPM by Dr Rayann Heman  . Debilitated   . Diastolic dysfunction   . Hypertension   . Obesity   . Permanent atrial fibrillation ([place])   . Pulmonary hypertension   . Rheumatoid arthritis(714.0)   . Sleep apnea   . Stroke [facility])    remote  . Venous insufficiency    with chronic leg ulcers    Past Surgical History:  Procedure Laterality Date  . PACEMAKER INSERTION  08/13/10   SJM by Dr Rayann Heman    There were no vitals filed for this visit.                  Wound Therapy - 12/13/15 1215    Subjective Pt has no complaints. Daughter states that she took her mother in to see the MD due to her cough.  She was given some antibiotics for the cough.      Patient and Family Stated Goals wounds to heal   Date of Onset 08/24/15   Wound Properties Date First Assessed: 09/24/15 Time First Assessed: 1133 Wound Type: Other (Comment) , venous stasis ulcer   Location: Leg Location Orientation: Right;Distal Wound Description (Comments): Over ten small wounds scattered around  anterior medial to anterior lateral with the largest being 2 cm diameter  Present on Admission: Yes   Dressing Type Compression wrap;Silver hydrofiber   Dressing Changed Changed   Dressing Status Old drainage   Dressing Change Frequency PRN   Site / Wound Assessment Friable;Red   % Wound base Red or Granulating 100%   % Wound base Yellow/Fibrinous Exudate 0%   Peri-wound Assessment Hemosiderin   Margins Attached edges (approximated)   Drainage Amount Moderate   Drainage Description Serosanguineous   Treatment Cleansed;Other (Comment)  manual to decrease decongested areas/    Selective Debridement - Location --   Selective Debridement - Tools Used --   Selective Debridement - Tissue Removed --   Wound Therapy - Clinical Statement Prescription to culture wound has not came back yet but pt is on antibiotics due to cough.  Pt LE slightly maceratted with honey collide therefore therapist went back to using the silverhydrofiber however placed a few drops of water on sheet in attempt to stop dressing from sticking to woundbed.  Wounds remain superficial with no real debridment needed this session.  Therapist explained to daughter that if wound continues to be superficial with no need for debridement we will need to  discharge to self care.  Daughter did not feel that she would be able to dress her mother's wound but stateed that she felt that her sister might be feel comfortable dressing the wounds.     Hydrotherapy Plan Debridement;Dressing change   Wound Plan Assess how moistening the silver hydrofiber does for wound care.  Measure wounds    Dressing  RT: moistened silver hydrofiber followed by profore.                      PT Short Term Goals - 11/22/15 1211      PT SHORT TERM GOAL #1   Title Pt to verbalize the importance of using compression every day to keep legs wound free   Time 1   Period Weeks   Status Achieved     PT SHORT TERM GOAL #2   Title Pt to state that the pain  in her legs are no greater than 3/10 to allow pt to ambulate for five minutes without stopping    Time 2   Period Weeks   Status Achieved     PT SHORT TERM GOAL #3   Title Pt to have only 5 wounds on Rt LE and 2 on Lt LE    Time 2   Period Weeks   Status Partially Met           PT Long Term Goals - 11/22/15 1212      PT LONG TERM GOAL #1   Title Pt to be wound free to reduce risk of infection    Time 4   Period Weeks   Status On-going     PT LONG TERM GOAL #2   Title Pt to have obtained compression garment and to be able to don and doff with minimal assist from family    Time 4   Period Weeks   Status On-going               Plan - 12/13/15 1254    Clinical Impression Statement see above    Rehab Potential Good   PT Frequency 2x / week   PT Duration 12 weeks  followed by one time a week for four weeks    PT Treatment/Interventions ADLs/Self Care Home Management;Gait training;Stair training;Therapeutic exercise;Patient/family education;Other (comment)  debridement and multilayer compression dressing    PT Next Visit Plan continue with  compression dressing to Rt LE; debridement if needed.  If pt continues not to need debridement will discharge to home health for dressing changes        Patient will benefit from skilled therapeutic intervention in order to improve the following deficits and impairments:  Pain, Increased edema, Difficulty walking, Decreased balance, Other (comment) (nonhealing wound )  Visit Diagnosis: Unsteadiness on feet  Varicose veins of right lower extremity with ulcer other part of lower leg (HCC)  Pain in right lower leg     Problem List Patient Active Problem List   Diagnosis Date Noted  . Alzheimer's disease 10/11/2014  . Essential hypertension 10/11/2014  . Cardiomyopathy, dilated ([place]) 07/24/2014  . Apnea, sleep 07/24/2014  . Anemia 04/28/2014  . Chronic hypoxemic respiratory failure ([place]) 04/12/2014  . Chronic ulcer of  leg ([place]) 04/12/2014  . Lymphedema of leg 04/12/2014  . Disorder of nutrition 04/12/2014  . Hypothyroidism 03/24/2014  . Stasis dermatitis of both legs 05/06/2013  . Pacemaker-St.Jude 11/25/2011  . Complete heart block ([place]) 12/12/2010  . Permanent atrial fibrillation ([place]) 12/12/2010  . Diastolic  dysfunction 12/12/2010  . Venous insufficiency 12/12/2010  . Long term current use of anticoagulant 09/30/2002    Rayetta Humphrey, PT CLT [phone] 12/13/2015, 12:57 PM  [place] [address] [place], [name], [address] Phone: [phone]   Fax:  [phone]  Name: GLADINE PLUDE MRN: [id number] Date of Birth: [birth year]

## 2015-12-18 ENCOUNTER — Ambulatory Visit (HOSPITAL_COMMUNITY): Payer: PPO | Admitting: Physical Therapy

## 2015-12-18 DIAGNOSIS — R2681 Unsteadiness on feet: Secondary | ICD-10-CM

## 2015-12-18 DIAGNOSIS — M79661 Pain in right lower leg: Secondary | ICD-10-CM | POA: Diagnosis not present

## 2015-12-18 DIAGNOSIS — L97819 Non-pressure chronic ulcer of other part of right lower leg with unspecified severity: Secondary | ICD-10-CM

## 2015-12-18 DIAGNOSIS — I83018 Varicose veins of right lower extremity with ulcer other part of lower leg: Secondary | ICD-10-CM

## 2015-12-18 NOTE — Therapy (Signed)
[place], [name], [address] Phone: [phone]   Fax:  [phone]  Wound Care Therapy  Patient Details  Name: [name] MRN: [id number] Date of Birth: 1933-06-29 Referring Provider: [name]   Encounter Date: 12/18/2015      PT End of Session - 12/18/15 1741    Visit Number 20   Number of Visits 26   Date for PT Re-Evaluation 12/22/15   Authorization Type [employer] : g code and recert done at visit 16; recert good until 69/62/9528   Authorization - Visit Number 70   Authorization - Number of Visits 26   PT Start Time 1520   PT Stop Time 1610   PT Time Calculation (min) 50 min   Activity Tolerance Patient tolerated treatment well   Behavior During Therapy [facility] for tasks assessed/performed      Past Medical History:  Diagnosis Date  . Chronic lung disease    on home O2   . Complete heart block (HCC)    s/p PPM by Dr Rayann Heman  . Debilitated   . Diastolic dysfunction   . Hypertension   . Obesity   . Permanent atrial fibrillation ([place])   . Pulmonary hypertension   . Rheumatoid arthritis(714.0)   . Sleep apnea   . Stroke [facility])    remote  . Venous insufficiency    with chronic leg ulcers    Past Surgical History:  Procedure Laterality Date  . PACEMAKER INSERTION  08/13/10   SJM by Dr Rayann Heman    There were no vitals filed for this visit.                  Wound Therapy - 12/18/15 1736    Subjective Doing well no pain   Patient and Family Stated Goals wounds to heal   Date of Onset 08/24/15   Wound Properties Date First Assessed: 09/24/15 Time First Assessed: 1133 Wound Type: Other (Comment) , venous stasis ulcer   Location: Leg Location Orientation: Right;Distal Wound Description (Comments): Over ten small wounds scattered around anterior medial to anterior lateral with the largest being 2 cm diameter  Present on Admission: Yes   Dressing Type Compression  wrap;Silver hydrofiber   Dressing Changed Changed   Dressing Status Old drainage   Dressing Change Frequency PRN   Site / Wound Assessment Friable;Red   % Wound base Red or Granulating 100%   % Wound base Yellow/Fibrinous Exudate 0%   Peri-wound Assessment Hemosiderin   Margins Attached edges (approximated)   Drainage Amount Moderate   Drainage Description Serosanguineous   Selective Debridement - Location Rt LE wounds    Selective Debridement - Tools Used Forceps   Selective Debridement - Tissue Removed slough and dry skin   Wound Therapy - Clinical Statement Dressing stuck to wound beds today.  Large amount of dry skin and slough removed.  Overall improving.  One cluster of wounds on posterior LE measuring 5X4cm, anterior shin 7X2cm, lateral superior 3X4 cm and lateral inferior 3X3cm.  All are superficial with 100% granulation following debridment.  Changed dressing to xeroform due to sticking and cut xeroform to shape of wounds to not macerate edges.  Rt LE cleansed and moisturized with lotion prior to rebandaging.  Profore is doing good job keeping the edema down.  Pt will continue to need skilled care until no debridement is needed then treatment may by changed to home health services.    Hydrotherapy Plan Debridement;Dressing change  Wound Plan Continue with debridement and appropriate dressing conducive to environment for wound healing.    Dressing  RT: moistened silver hydrofiber followed by profore.                      PT Short Term Goals - 11/22/15 1211      PT SHORT TERM GOAL #1   Title Pt to verbalize the importance of using compression every day to keep legs wound free   Time 1   Period Weeks   Status Achieved     PT SHORT TERM GOAL #2   Title Pt to state that the pain in her legs are no greater than 3/10 to allow pt to ambulate for five minutes without stopping    Time 2   Period Weeks   Status Achieved     PT SHORT TERM GOAL #3   Title Pt to have only 5  wounds on Rt LE and 2 on Lt LE    Time 2   Period Weeks   Status Partially Met           PT Long Term Goals - 11/22/15 1212      PT LONG TERM GOAL #1   Title Pt to be wound free to reduce risk of infection    Time 4   Period Weeks   Status On-going     PT LONG TERM GOAL #2   Title Pt to have obtained compression garment and to be able to don and doff with minimal assist from family    Time 4   Period Weeks   Status On-going             Patient will benefit from skilled therapeutic intervention in order to improve the following deficits and impairments:     Visit Diagnosis: Unsteadiness on feet  Varicose veins of right lower extremity with ulcer other part of lower leg (HCC)  Pain in right lower leg     Problem List Patient Active Problem List   Diagnosis Date Noted  . Alzheimer's disease 10/11/2014  . Essential hypertension 10/11/2014  . Cardiomyopathy, dilated ([place]) 07/24/2014  . Apnea, sleep 07/24/2014  . Anemia 04/28/2014  . Chronic hypoxemic respiratory failure ([place]) 04/12/2014  . Chronic ulcer of leg ([place]) 04/12/2014  . Lymphedema of leg 04/12/2014  . Disorder of nutrition 04/12/2014  . Hypothyroidism 03/24/2014  . Stasis dermatitis of both legs 05/06/2013  . Pacemaker-St.Jude 11/25/2011  . Complete heart block ([place]) 12/12/2010  . Permanent atrial fibrillation ([place]) 12/12/2010  . Diastolic dysfunction 02/26/4386  . Venous insufficiency 12/12/2010  . Long term current use of anticoagulant 09/30/2002    Teena Irani, PTA/CLT [place], PT CLT [phone] 12/18/2015, 5:42 PM  [place] [address] [place], [name], [address] Phone: [phone]   Fax:  [phone]  Name: [name] MRN: [id number] Date of Birth: 1933-11-04

## 2015-12-19 ENCOUNTER — Encounter: Payer: Self-pay | Admitting: *Deleted

## 2015-12-19 ENCOUNTER — Encounter: Payer: Self-pay | Admitting: Cardiology

## 2015-12-19 ENCOUNTER — Ambulatory Visit (INDEPENDENT_AMBULATORY_CARE_PROVIDER_SITE_OTHER): Payer: PPO | Admitting: Cardiology

## 2015-12-19 VITALS — BP 123/86 | HR 73 | Ht 59.0 in | Wt 141.4 lb

## 2015-12-19 DIAGNOSIS — I38 Endocarditis, valve unspecified: Secondary | ICD-10-CM | POA: Diagnosis not present

## 2015-12-19 DIAGNOSIS — I442 Atrioventricular block, complete: Secondary | ICD-10-CM

## 2015-12-19 DIAGNOSIS — Z950 Presence of cardiac pacemaker: Secondary | ICD-10-CM | POA: Diagnosis not present

## 2015-12-19 DIAGNOSIS — I480 Paroxysmal atrial fibrillation: Secondary | ICD-10-CM | POA: Diagnosis not present

## 2015-12-19 MED ORDER — APIXABAN 5 MG PO TABS: 5.0000 mg | ORAL_TABLET | Freq: Two times a day (BID) | ORAL | 0 refills | Status: DC

## 2015-12-19 NOTE — Progress Notes (Signed)
Clinical Summary [name] is a 82 y.o.female seen today for follow up of the following medical problems.   1. Heart block - pacemaker followed by Dr Rayann Heman - no recent lightheadedness or dizziness.   2. PAF - no recent palpitations. No significant dizziness. - compliant with meds. She is off eliquis for a few weeks due to cost, family restarted aspirin   3. Valvular heart disease - breathing is overall stable. No recent LE edema  4. Leg swelling - wearing compression stockings at home. Compliant with lasix.   Past Medical History:  Diagnosis Date  . Chronic lung disease    on home O2   . Complete heart block (HCC)    s/p PPM by Dr Rayann Heman  . Debilitated   . Diastolic dysfunction   . Hypertension   . Obesity   . Permanent atrial fibrillation ([place])   . Pulmonary hypertension   . Rheumatoid arthritis(714.0)   . Sleep apnea   . Stroke [facility])    remote  . Venous insufficiency    with chronic leg ulcers     No Known Allergies   Current Outpatient Prescriptions  Medication Sig Dispense Refill  . azithromycin (ZITHROMAX) 500 MG tablet As directed 6 tablet 0  . donepezil (ARICEPT) 10 MG tablet TAKE ONE TABLET DAILY AT BEDTIME 90 tablet 1  . ELIQUIS 5 MG TABS tablet TAKE ONE TABLET BY MOUTH TWICE DAILY 180 tablet 0  . ferrous sulfate 325 (65 FE) MG EC tablet Take 1 tablet (325 mg total) by mouth 3 (three) times daily with meals. 30 tablet 3  . furosemide (LASIX) 40 MG tablet One at breakfast and 1/2 at lunch 45 tablet 2  . hydrocortisone (PROCTOZONE-HC) 2.5 % rectal cream Place 1 application rectally 2 (two) times daily. 30 g 0  . levothyroxine (SYNTHROID, LEVOTHROID) 50 MCG tablet TAKE ONE (1) TABLET EACH DAY 90 tablet 2  . losartan (COZAAR) 100 MG tablet Take 1 tablet (100 mg total) by mouth daily. (Patient not taking: Reported on 12/10/2015) 90 tablet 0  . Multiple Vitamins-Minerals (MULTIVITAMIN WITH MINERALS) tablet Take 1 tablet by mouth daily.    Marland Kitchen  NAMENDA XR 28 MG CP24 24 hr capsule TAKE ONE (1) CAPSULE EACH DAY 30 capsule 4  . OXYGEN-HELIUM IN Inhale into the lungs continuous. 4L    . potassium chloride SA (K-DUR,KLOR-CON) 10 MEQ tablet Take 1 tablet (10 mEq total) by mouth daily. 30 tablet 0  . simvastatin (ZOCOR) 20 MG tablet TAKE ONE (1) TABLET EACH DAY 90 tablet 0  . traMADol (ULTRAM) 50 MG tablet Take 1 tablet (50 mg total) by mouth 3 (three) times daily as needed for moderate pain. 60 tablet 3   No current facility-administered medications for this visit.      Past Surgical History:  Procedure Laterality Date  . PACEMAKER INSERTION  08/13/10   SJM by Dr Rayann Heman     No Known Allergies    No family history on file.   Social History [name] reports that she has never smoked. She has never used smokeless tobacco. [name] reports that she does not drink alcohol.   Review of Systems CONSTITUTIONAL: No weight loss, fever, chills, weakness or fatigue.  HEENT: Eyes: No visual loss, blurred vision, double vision or yellow sclerae.No hearing loss, sneezing, congestion, runny nose or sore throat.  SKIN: No rash or itching.  CARDIOVASCULAR: per hpi RESPIRATORY: No shortness of breath, cough or sputum.  GASTROINTESTINAL: No anorexia, nausea,  vomiting or diarrhea. No abdominal pain or blood.  GENITOURINARY: No burning on urination, no polyuria NEUROLOGICAL: No headache, dizziness, syncope, paralysis, ataxia, numbness or tingling in the extremities. No change in bowel or bladder control.  MUSCULOSKELETAL: No muscle, back pain, joint pain or stiffness.  LYMPHATICS: No enlarged nodes. No history of splenectomy.  PSYCHIATRIC: No history of depression or anxiety.  ENDOCRINOLOGIC: No reports of sweating, cold or heat intolerance. No polyuria or polydipsia.  Marland Kitchen   Physical Examination Vitals:   12/19/15 1056  BP: 123/86  Pulse: 73   Vitals:   12/19/15 1056  Weight: 141 lb 6.4 oz (64.1 kg)  Height: 4\' 11"  (1.499  m)    Gen: resting comfortably, no acute distress HEENT: no scleral icterus, pupils equal round and reactive, no palptable cervical adenopathy,  CV: irreg, 2/6 systolic murmur RUSB and at apex,, no jvd Resp: Clear to auscultation bilaterally GI: abdomen is soft, non-tender, non-distended, normal bowel sounds, no hepatosplenomegaly MSK: extremities are warm, no edema.  Skin: warm, no rash Neuro:  no focal deficits Psych: appropriate affect   Diagnostic Studies  04/2014 Echo Study Conclusions  - Left ventricle: The cavity size was normal. Wall thickness was normal. Systolic function was normal. The estimated ejection fraction was in the range of 55% to 60%. Abnormal diastolic function, indeterminate grade. Wall motion was normal; there were no regional wall motion abnormalities. - Aortic valve: Mildly calcified annulus. Trileaflet; mildly thickened leaflets. There was mild stenosis. There was mild to moderate regurgitation. Mean gradient (S): 13 mm Hg. Valve area (VTI): 1.56 cm^2. Valve area (Vmax): 1.45 cm^2. Valve area (Vmean): 1.34 cm^2. Regurgitation pressure half-time: 542 ms. - Mitral valve: Mildly to moderately calcified annulus. Mildly thickened leaflets . There was moderate regurgitation. The MR VC is 0.5 cm. - Left atrium: The atrium was severely dilated. - Right ventricle: The cavity size was mildly dilated. - Right atrium: The atrium was severely dilated. - Tricuspid valve: There are 2 seperate TR jets. TR VC for jet 1 is 4.0 cm, the TR VC for jet 2 is 0.3 cm. The composite of the jets is moderate to severe TR. - Pulmonary arteries: Systolic pressure was moderately increased. PA peak pressure: 57 mm Hg (S). - Inferior vena cava: The vessel was normal in size. The respirophasic diameter changes were in the normal range (>= 50%), consistent with normal central venous pressure. - Technically adequate study.    Assessment and Plan    1. Heart block - pacemaker management per Dr Rayann Heman  2. Valvular heart disease - moderate MR and TR, continue to monitor  3. LE edema - continue compression stockings, diuretics.   4. PAF - no current symptoms - CHADS2vasc score is 3, restart eliquis. Will give samples, asked to contact if cost remains an issue - EKG in clinic shows v-paced rhythm  F/u 6 months       Arnoldo Lenis, M.D.

## 2015-12-19 NOTE — Patient Instructions (Signed)
Your physician wants you to follow-up in: [place] DR. BRANCH  You will receive a reminder letter in the mail two months in advance. If you don't receive a letter, please call our office to schedule the follow-up appointment.   Your physician has recommended you make the following change in your medication:   STOP ASPRIN   WE HAVE GIVEN YOU ELIQUIS SAMPLES   Thank you for choosing [facility]!!

## 2015-12-25 DIAGNOSIS — J449 Chronic obstructive pulmonary disease, unspecified: Secondary | ICD-10-CM | POA: Diagnosis not present

## 2015-12-27 ENCOUNTER — Ambulatory Visit (HOSPITAL_COMMUNITY): Payer: PPO | Admitting: Physical Therapy

## 2015-12-27 ENCOUNTER — Other Ambulatory Visit (HOSPITAL_COMMUNITY)
Admission: AD | Admit: 2015-12-27 | Discharge: 2015-12-27 | Disposition: A | Discharge status: Home / Self Care | Payer: PPO | Source: Skilled Nursing Facility | Attending: Family Medicine | Admitting: Family Medicine

## 2015-12-27 DIAGNOSIS — M79661 Pain in right lower leg: Secondary | ICD-10-CM

## 2015-12-27 DIAGNOSIS — L97829 Non-pressure chronic ulcer of other part of left lower leg with unspecified severity: Secondary | ICD-10-CM

## 2015-12-27 DIAGNOSIS — S81801A Unspecified open wound, right lower leg, initial encounter: Secondary | ICD-10-CM | POA: Diagnosis not present

## 2015-12-27 DIAGNOSIS — X58XXXA Exposure to other specified factors, initial encounter: Secondary | ICD-10-CM | POA: Diagnosis not present

## 2015-12-27 DIAGNOSIS — L97819 Non-pressure chronic ulcer of other part of right lower leg with unspecified severity: Secondary | ICD-10-CM

## 2015-12-27 DIAGNOSIS — M79662 Pain in left lower leg: Secondary | ICD-10-CM

## 2015-12-27 DIAGNOSIS — I83018 Varicose veins of right lower extremity with ulcer other part of lower leg: Secondary | ICD-10-CM

## 2015-12-27 DIAGNOSIS — I83028 Varicose veins of left lower extremity with ulcer other part of lower leg: Secondary | ICD-10-CM

## 2015-12-27 DIAGNOSIS — R2681 Unsteadiness on feet: Secondary | ICD-10-CM

## 2015-12-27 NOTE — Therapy (Signed)
[place], [name], [address] Phone: [phone]   Fax:  [phone]  Wound Care Therapy  Patient Details  Name: [name] MRN: [id number] Date of Birth: Oct 02, 1933 Referring Provider: [name]   Encounter Date: 12/27/2015      PT End of Session - 12/27/15 1506    Visit Number 21   Number of Visits 26   Date for PT Re-Evaluation 12/22/15   Authorization Type [employer] : g code and recert done at visit 16; recert good until 02/72/5366   Authorization - Visit Number 21   Authorization - Number of Visits 26   PT Start Time 4403   PT Stop Time 1118   PT Time Calculation (min) 38 min   Activity Tolerance Patient tolerated treatment well   Behavior During Therapy [facility] for tasks assessed/performed      Past Medical History:  Diagnosis Date  . Chronic lung disease    on home O2   . Complete heart block (HCC)    s/p PPM by Dr Rayann Heman  . Debilitated   . Diastolic dysfunction   . Hypertension   . Obesity   . Permanent atrial fibrillation ([place])   . Pulmonary hypertension   . Rheumatoid arthritis(714.0)   . Sleep apnea   . Stroke [facility])    remote  . Venous insufficiency    with chronic leg ulcers    Past Surgical History:  Procedure Laterality Date  . PACEMAKER INSERTION  08/13/10   SJM by Dr Rayann Heman    There were no vitals filed for this visit.                  Wound Therapy - 12/27/15 1501    Subjective pt without complaints   Patient and Family Stated Goals wounds to heal   Date of Onset 08/24/15   Wound Properties Date First Assessed: 09/24/15 Time First Assessed: 1133 Wound Type: Other (Comment) , venous stasis ulcer   Location: Leg Location Orientation: Right;Distal Wound Description (Comments): Over ten small wounds scattered around anterior medial to anterior lateral with the largest being 2 cm diameter  Present on Admission: Yes   Dressing Type Compression  wrap;Impregnated gauze (bismuth)   Dressing Changed Changed   Dressing Status Old drainage   Dressing Change Frequency PRN   Site / Wound Assessment Friable;Red   % Wound base Red or Granulating 100%   % Wound base Yellow/Fibrinous Exudate 0%   Peri-wound Assessment Hemosiderin   Margins Attached edges (approximated)   Drainage Amount Moderate   Drainage Description Serosanguineous   Treatment Cleansed;Debridement (Selective)   Selective Debridement - Location Rt LE wounds    Selective Debridement - Tools Used Forceps   Selective Debridement - Tissue Removed slough and dry skin   Wound Therapy - Clinical Statement Culture obtained this session following cleansing of wound and perimeter.  Wounds hypergranulated with macerated edges.  No change in wound size.  changed dressing to alginate without silver this session to see if improves and promotes closure.    Hydrotherapy Plan Debridement;Dressing change   Wound Plan Continue with debridement and appropriate dressing conducive to environment for wound healing.    Dressing  RT: calcium alginate followed by profore.                      PT Short Term Goals - 11/22/15 1211      PT SHORT TERM GOAL #1  Title Pt to verbalize the importance of using compression every day to keep legs wound free   Time 1   Period Weeks   Status Achieved     PT SHORT TERM GOAL #2   Title Pt to state that the pain in her legs are no greater than 3/10 to allow pt to ambulate for five minutes without stopping    Time 2   Period Weeks   Status Achieved     PT SHORT TERM GOAL #3   Title Pt to have only 5 wounds on Rt LE and 2 on Lt LE    Time 2   Period Weeks   Status Partially Met           PT Long Term Goals - 11/22/15 1212      PT LONG TERM GOAL #1   Title Pt to be wound free to reduce risk of infection    Time 4   Period Weeks   Status On-going     PT LONG TERM GOAL #2   Title Pt to have obtained compression garment and to be  able to don and doff with minimal assist from family    Time 4   Period Weeks   Status On-going             Patient will benefit from skilled therapeutic intervention in order to improve the following deficits and impairments:     Visit Diagnosis: Unsteadiness on feet  Varicose veins of right lower extremity with ulcer other part of lower leg (HCC)  Pain in right lower leg  Pain in left lower leg  Varicose veins of left lower extremity with ulcer other part of lower leg [facility])     Problem List Patient Active Problem List   Diagnosis Date Noted  . Alzheimer's disease 10/11/2014  . Essential hypertension 10/11/2014  . Cardiomyopathy, dilated ([place]) 07/24/2014  . Apnea, sleep 07/24/2014  . Anemia 04/28/2014  . Chronic hypoxemic respiratory failure ([place]) 04/12/2014  . Chronic ulcer of leg ([place]) 04/12/2014  . Lymphedema of leg 04/12/2014  . Disorder of nutrition 04/12/2014  . Hypothyroidism 03/24/2014  . Stasis dermatitis of both legs 05/06/2013  . Pacemaker-St.Jude 11/25/2011  . Complete heart block ([place]) 12/12/2010  . Permanent atrial fibrillation ([place]) 12/12/2010  . Diastolic dysfunction 13/24/4010  . Venous insufficiency 12/12/2010  . Long term current use of anticoagulant 09/30/2002    [name], PTA/CLT [phone]  12/27/2015, 3:09 PM  [facility] [address] [place], [name], [address] Phone: [phone]   Fax:  [phone]  Name: [name] MRN: [id number] Date of Birth: 1933-06-18

## 2015-12-28 ENCOUNTER — Ambulatory Visit: Payer: PPO

## 2015-12-28 ENCOUNTER — Ambulatory Visit (INDEPENDENT_AMBULATORY_CARE_PROVIDER_SITE_OTHER): Payer: PPO | Admitting: *Deleted

## 2015-12-28 DIAGNOSIS — Z23 Encounter for immunization: Secondary | ICD-10-CM | POA: Diagnosis not present

## 2015-12-29 LAB — AEROBIC CULTURE W GRAM STAIN (SUPERFICIAL SPECIMEN)

## 2015-12-29 LAB — AEROBIC CULTURE  (SUPERFICIAL SPECIMEN)

## 2016-01-03 ENCOUNTER — Encounter: Payer: Self-pay | Admitting: Family Medicine

## 2016-01-03 ENCOUNTER — Ambulatory Visit (HOSPITAL_COMMUNITY): Payer: PPO | Attending: Family Medicine | Admitting: Physical Therapy

## 2016-01-03 DIAGNOSIS — R2681 Unsteadiness on feet: Secondary | ICD-10-CM | POA: Diagnosis not present

## 2016-01-03 DIAGNOSIS — M79662 Pain in left lower leg: Secondary | ICD-10-CM | POA: Insufficient documentation

## 2016-01-03 DIAGNOSIS — L97819 Non-pressure chronic ulcer of other part of right lower leg with unspecified severity: Secondary | ICD-10-CM

## 2016-01-03 DIAGNOSIS — I83028 Varicose veins of left lower extremity with ulcer other part of lower leg: Secondary | ICD-10-CM | POA: Diagnosis not present

## 2016-01-03 DIAGNOSIS — I83018 Varicose veins of right lower extremity with ulcer other part of lower leg: Secondary | ICD-10-CM | POA: Diagnosis not present

## 2016-01-03 DIAGNOSIS — M79661 Pain in right lower leg: Secondary | ICD-10-CM | POA: Diagnosis not present

## 2016-01-03 NOTE — Therapy (Signed)
[place], [name], [address] Phone: [phone]   Fax:  [phone]  Wound Care Therapy  Patient Details  Name: [name] MRN: [id number] Date of Birth: Nov 21, 1933 Referring Provider: [name]   Encounter Date: 01/03/2016      PT End of Session - 01/03/16 1244    Visit Number 22   Number of Visits 26   Date for PT Re-Evaluation 12/22/15   Authorization Type [employer] : g code and recert done at visit 16   Authorization - Visit Number 22   Authorization - Number of Visits 26   PT Start Time 0950   PT Stop Time 1038   PT Time Calculation (min) 48 min   Activity Tolerance Patient tolerated treatment well   Behavior During Therapy [facility] for tasks assessed/performed      Past Medical History:  Diagnosis Date  . Chronic lung disease    on home O2   . Complete heart block (HCC)    s/p PPM by Dr Rayann Heman  . Debilitated   . Diastolic dysfunction   . Hypertension   . Obesity   . Permanent atrial fibrillation ([place])   . Pulmonary hypertension   . Rheumatoid arthritis(714.0)   . Sleep apnea   . Stroke [facility])    remote  . Venous insufficiency    with chronic leg ulcers    Past Surgical History:  Procedure Laterality Date  . PACEMAKER INSERTION  08/13/10   SJM by Dr Rayann Heman    There were no vitals filed for this visit.                  Wound Therapy - 01/03/16 1231    Subjective Pt states no pain or issues.  Daughter reports wound cultures back according to "my chart" and shows MRSA   Patient and Family Stated Goals wounds to heal   Date of Onset 08/24/15   Wound Properties Date First Assessed: 09/24/15 Time First Assessed: 1133 Wound Type: Other (Comment) , venous stasis ulcer   Location: Leg Location Orientation: Right;Distal Wound Description (Comments): anterior wounds (in horseshoe appearance)  Present on Admission: Yes   Dressing Type Compression wrap  calcium  alginate   Dressing Changed Changed   Dressing Status Old drainage   Dressing Change Frequency PRN   Site / Wound Assessment Friable;Red   % Wound base Red or Granulating 100%   % Wound base Yellow/Fibrinous Exudate 0%   Peri-wound Assessment Hemosiderin   Wound Length (cm) 10 cm   Wound Width (cm) 6 cm   Wound Depth (cm) 0 cm   Margins Attached edges (approximated)   Drainage Amount Moderate   Drainage Description Serosanguineous   Treatment Cleansed;Debridement (Selective)   Wound Properties Date First Assessed: 01/03/16 Time First Assessed: 1234 Wound Type: Other (Comment) Location: Leg Location Orientation: Right Wound Description (Comments): 4 clustered wounds together on lateral LE Present on Admission: Yes   Dressing Type Gauze (Comment);Compression wrap  alginate   Dressing Changed Changed   Dressing Status Intact;Old drainage   Dressing Change Frequency PRN   Site / Wound Assessment Friable;Painful;Red;Pink;Bleeding   % Wound base Red or Granulating 100%   Peri-wound Assessment Erythema (blanchable);Hemosiderin   Wound Length (cm) 4 cm  4 wounds approx 2X1.5 each   Wound Width (cm) 4 cm   Wound Depth (cm) 0 cm   Margins Attached edges (approximated)   Drainage Amount Moderate   Drainage Description Serosanguineous  Treatment Cleansed;Debridement (Selective)   Selective Debridement - Location Rt LE wounds    Selective Debridement - Tools Used Forceps   Selective Debridement - Tissue Removed slough and dry skin   Wound Therapy - Clinical Statement Culture results in per daughter and intends on contacting MD office for further instructions.  wounds continue to drain moderately with some adhesion of bandages to surrounding area.  Pt now with one large area on anterior Rt LE that resembles a horseshoe with sides approx 10X2.5cm and bridge across connecting 6cm in width.  Lateral clustering of wounds contains 4 round areas aprox 2X1.5cm.  All areas are hypergranualated at 100%.   Noted improvement since changing to plain calcium alginate last session, however covered with telfa this session as drainage is drying and sticking to surrounding skin.   Pt tolerated well, however skin is sensitive.   Inspected Lt LE as well.  removed compression hose and noted extreme dryness.  Educated pateint and duaghter these must be removed each night and moisturized.  both verbalized understanding.  Pt will need continued woundcare until wounds are healed on Rt LE.   Factors Delaying/Impairing Wound Healing Infection - systemic/local;Polypharmacy;Vascular compromise   Hydrotherapy Plan Debridement;Dressing change   Wound Therapy - Frequency --  2X week for 6 more weeks   Wound Plan Continue with debridement and appropriate dressing conducive to environment for wound healing.    Dressing  RT: calcium alginate followed by profore.                      PT Short Term Goals - 01/03/16 1245      PT SHORT TERM GOAL #1   Title Pt to verbalize the importance of using compression every day to keep legs wound free   Time 1   Period Weeks   Status Achieved     PT SHORT TERM GOAL #2   Title Pt to state that the pain in her legs are no greater than 3/10 to allow pt to ambulate for five minutes without stopping    Time 2   Period Weeks   Status Achieved     PT SHORT TERM GOAL #3   Title Pt to have only 5 wounds on Rt LE and 2 on Lt LE    Time 2   Period Weeks   Status Partially Met           PT Long Term Goals - 01/03/16 1245      PT LONG TERM GOAL #1   Title Pt to be wound free to reduce risk of infection    Time 4   Period Weeks   Status On-going     PT LONG TERM GOAL #2   Title Pt to have obtained compression garment and to be able to don and doff with minimal assist from family    Time 4   Period Weeks   Status On-going             Patient will benefit from skilled therapeutic intervention in order to improve the following deficits and impairments:      Visit Diagnosis: Unsteadiness on feet  Varicose veins of right lower extremity with ulcer other part of lower leg (HCC)  Pain in right lower leg     Problem List Patient Active Problem List   Diagnosis Date Noted  . Alzheimer's disease 10/11/2014  . Essential hypertension 10/11/2014  . Cardiomyopathy, dilated ([place]) 07/24/2014  . Apnea, sleep 07/24/2014  .  Anemia 04/28/2014  . Chronic hypoxemic respiratory failure ([place]) 04/12/2014  . Chronic ulcer of leg ([place]) 04/12/2014  . Lymphedema of leg 04/12/2014  . Disorder of nutrition 04/12/2014  . Hypothyroidism 03/24/2014  . Stasis dermatitis of both legs 05/06/2013  . Pacemaker-St.Jude 11/25/2011  . Complete heart block ([place]) 12/12/2010  . Permanent atrial fibrillation ([place]) 12/12/2010  . Diastolic dysfunction 93/23/5573  . Venous insufficiency 12/12/2010  . Long term current use of anticoagulant 09/30/2002    Teena Irani, PTA/CLT [phone]  01/03/2016, 12:47 PM  [place] [address] [place], [name], [address] Phone: [phone]   Fax:  [phone]  Name: [name] MRN: [id number] Date of Birth: 01/02/1934 Rayetta Humphrey, [place] CLT [phone]

## 2016-01-05 ENCOUNTER — Other Ambulatory Visit: Payer: Self-pay | Admitting: Family Medicine

## 2016-01-05 MED ORDER — DOXYCYCLINE HYCLATE 100 MG PO CAPS: 100.0000 mg | ORAL_CAPSULE | Freq: Two times a day (BID) | ORAL | 0 refills | Status: DC

## 2016-01-07 ENCOUNTER — Ambulatory Visit (INDEPENDENT_AMBULATORY_CARE_PROVIDER_SITE_OTHER): Payer: PPO | Admitting: *Deleted

## 2016-01-07 ENCOUNTER — Other Ambulatory Visit: Payer: Self-pay | Admitting: Family Medicine

## 2016-01-07 DIAGNOSIS — I442 Atrioventricular block, complete: Secondary | ICD-10-CM

## 2016-01-10 ENCOUNTER — Ambulatory Visit (HOSPITAL_COMMUNITY): Payer: PPO | Admitting: Physical Therapy

## 2016-01-10 DIAGNOSIS — I83028 Varicose veins of left lower extremity with ulcer other part of lower leg: Secondary | ICD-10-CM

## 2016-01-10 DIAGNOSIS — R2681 Unsteadiness on feet: Secondary | ICD-10-CM

## 2016-01-10 DIAGNOSIS — I83018 Varicose veins of right lower extremity with ulcer other part of lower leg: Secondary | ICD-10-CM

## 2016-01-10 DIAGNOSIS — L97829 Non-pressure chronic ulcer of other part of left lower leg with unspecified severity: Secondary | ICD-10-CM

## 2016-01-10 DIAGNOSIS — L97819 Non-pressure chronic ulcer of other part of right lower leg with unspecified severity: Secondary | ICD-10-CM

## 2016-01-10 DIAGNOSIS — M79662 Pain in left lower leg: Secondary | ICD-10-CM

## 2016-01-10 DIAGNOSIS — M79661 Pain in right lower leg: Secondary | ICD-10-CM

## 2016-01-10 NOTE — Therapy (Signed)
[place], [name], [address] Phone: [phone]   Fax:  [phone]  Wound Care Therapy  Patient Details  Name: [name] MRN: [id number] Date of Birth: [birth year] Referring Provider: [name]   Encounter Date: 01/10/2016    Past Medical History:  Diagnosis Date  . Chronic lung disease    on home O2   . Complete heart block (HCC)    s/p PPM by Dr Rayann Heman  . Debilitated   . Diastolic dysfunction   . Hypertension   . Obesity   . Permanent atrial fibrillation ([place])   . Pulmonary hypertension   . Rheumatoid arthritis(714.0)   . Sleep apnea   . Stroke [facility])    remote  . Venous insufficiency    with chronic leg ulcers    Past Surgical History:  Procedure Laterality Date  . PACEMAKER INSERTION  08/13/10   SJM by Dr Rayann Heman    There were no vitals filed for this visit.                  Wound Therapy - 01/10/16 1211    Subjective no pain.  daughter reports MD prescribed doxycycline antibiotics and has been on them since Sunday (4 days)   Patient and Family Stated Goals wounds to heal   Date of Onset 08/24/15   Wound Properties Date First Assessed: 09/24/15 Time First Assessed: 1133 Wound Type: Other (Comment) , venous stasis ulcer   Location: Leg Location Orientation: Right;Distal Wound Description (Comments): anterior wounds (in horseshoe appearance)  Present on Admission: Yes   Dressing Type Compression wrap  calcium alginate   Dressing Changed Changed   Dressing Status Old drainage   Dressing Change Frequency PRN   Site / Wound Assessment Friable;Red   % Wound base Red or Granulating 100%   % Wound base Yellow/Fibrinous Exudate 0%   Peri-wound Assessment Hemosiderin   Margins Attached edges (approximated)   Drainage Amount Moderate   Drainage Description Serosanguineous   Wound Properties Date First Assessed: 01/03/16 Time First Assessed: 1234 Wound Type: Other (Comment)  Location: Leg Location Orientation: Right Wound Description (Comments): 4 clustered wounds together on lateral LE Present on Admission: Yes   Dressing Type Gauze (Comment);Compression wrap  alginate   Dressing Changed Changed   Dressing Status Intact;Old drainage   Dressing Change Frequency PRN   Site / Wound Assessment Friable;Painful;Red;Pink;Bleeding   % Wound base Red or Granulating 100%   Peri-wound Assessment Erythema (blanchable);Hemosiderin   Margins Attached edges (approximated)   Drainage Amount Moderate   Drainage Description Serosanguineous   Treatment Cleansed;Debridement (Selective)   Selective Debridement - Location Rt LE wounds    Selective Debridement - Tools Used Forceps   Selective Debridement - Tissue Removed slough and dry skin   Wound Therapy - Clinical Statement Rt LE with great deal of drainage and some maceration present perimeter of wounds.  Wounds, however appear improved with some outer approximation and reduced hypergranulation.  Cleansed and moisturized Rt LE well prior to redressing using calcium alginate and profore compression system.  Pt reported overall comfort.     Factors Delaying/Impairing Wound Healing Infection - systemic/local;Polypharmacy;Vascular compromise   Hydrotherapy Plan Debridement;Dressing change   Wound Therapy - Frequency --  2X week for 6 more weeks   Wound Plan Continue with debridement and appropriate dressing conducive to environment for wound healing.    Dressing  RT: calcium alginate followed by profore.  PT Short Term Goals - 01/03/16 1245      PT SHORT TERM GOAL #1   Title Pt to verbalize the importance of using compression every day to keep legs wound free   Time 1   Period Weeks   Status Achieved     PT SHORT TERM GOAL #2   Title Pt to state that the pain in her legs are no greater than 3/10 to allow pt to ambulate for five minutes without stopping    Time 2   Period Weeks   Status  Achieved     PT SHORT TERM GOAL #3   Title Pt to have only 5 wounds on Rt LE and 2 on Lt LE    Time 2   Period Weeks   Status Partially Met           PT Long Term Goals - 01/03/16 1245      PT LONG TERM GOAL #1   Title Pt to be wound free to reduce risk of infection    Time 4   Period Weeks   Status On-going     PT LONG TERM GOAL #2   Title Pt to have obtained compression garment and to be able to don and doff with minimal assist from family    Time 4   Period Weeks   Status On-going             Patient will benefit from skilled therapeutic intervention in order to improve the following deficits and impairments:     Visit Diagnosis: Unsteadiness on feet  Varicose veins of right lower extremity with ulcer other part of lower leg (HCC)  Pain in right lower leg  Pain in left lower leg  Varicose veins of left lower extremity with ulcer other part of lower leg [facility])     Problem List Patient Active Problem List   Diagnosis Date Noted  . Alzheimer's disease 10/11/2014  . Essential hypertension 10/11/2014  . Cardiomyopathy, dilated ([place]) 07/24/2014  . Apnea, sleep 07/24/2014  . Anemia 04/28/2014  . Chronic hypoxemic respiratory failure ([place]) 04/12/2014  . Chronic ulcer of leg ([place]) 04/12/2014  . Lymphedema of leg 04/12/2014  . Disorder of nutrition 04/12/2014  . Hypothyroidism 03/24/2014  . Stasis dermatitis of both legs 05/06/2013  . Pacemaker-St.Jude 11/25/2011  . Complete heart block ([place]) 12/12/2010  . Permanent atrial fibrillation ([place]) 12/12/2010  . Diastolic dysfunction 62/83/1517  . Venous insufficiency 12/12/2010  . Long term current use of anticoagulant 09/30/2002    Teena Irani, PTA/CLT [phone]  01/10/2016, 1:20 PM  [place] [address] [place], [name], [address] Phone: [phone]   Fax:  [phone]  Name: [name] MRN: [id number] Date of Birth:  July 16, 1933

## 2016-01-14 LAB — CUP PACEART REMOTE DEVICE CHECK
Implantable Lead Implant Date: 20120717
Implantable Lead Location: 753860
Implantable Pulse Generator Implant Date: 20120717
Lead Channel Setting Pacing Amplitude: 2.5 V
Lead Channel Setting Pacing Pulse Width: 0.4 ms
MDC IDC LEAD MODEL: 1948
MDC IDC SESS DTM: 20171210191517
MDC IDC SET LEADCHNL RV SENSING SENSITIVITY: 2.5 mV
Pulse Gen Model: 1210
Pulse Gen Serial Number: 7245638

## 2016-01-17 ENCOUNTER — Telehealth (HOSPITAL_COMMUNITY): Payer: Self-pay | Admitting: Family Medicine

## 2016-01-17 ENCOUNTER — Ambulatory Visit (HOSPITAL_COMMUNITY): Payer: PPO | Admitting: Physical Therapy

## 2016-01-17 NOTE — Telephone Encounter (Signed)
12/21 daughter called and said that her mom has diarrhea.  I explained our policy with those symptoms she should be free for 24 hours.

## 2016-01-23 ENCOUNTER — Ambulatory Visit (HOSPITAL_COMMUNITY): Payer: PPO | Admitting: Physical Therapy

## 2016-01-23 DIAGNOSIS — I83018 Varicose veins of right lower extremity with ulcer other part of lower leg: Secondary | ICD-10-CM

## 2016-01-23 DIAGNOSIS — R2681 Unsteadiness on feet: Secondary | ICD-10-CM | POA: Diagnosis not present

## 2016-01-23 DIAGNOSIS — M79661 Pain in right lower leg: Secondary | ICD-10-CM

## 2016-01-23 DIAGNOSIS — L97819 Non-pressure chronic ulcer of other part of right lower leg with unspecified severity: Principal | ICD-10-CM

## 2016-01-23 NOTE — Therapy (Signed)
[place], [name], [address] Phone: [phone]   Fax:  [phone]  Wound Care Therapy  Patient Details  Name: [name] MRN: [id number] Date of Birth: [birth year] Referring Provider: [name]   Encounter Date: 01/23/2016      PT End of Session - 01/23/16 1250    Visit Number 24   Number of Visits 26   Date for PT Re-Evaluation 12/22/15   Authorization Type [employer] : g code and recert done at visit 16   Authorization - Visit Number 24   Authorization - Number of Visits 26   PT Start Time 1115   PT Stop Time 1200   PT Time Calculation (min) 45 min   Activity Tolerance Patient tolerated treatment well   Behavior During Therapy [facility] for tasks assessed/performed      Past Medical History:  Diagnosis Date  . Chronic lung disease    on home O2   . Complete heart block (HCC)    s/p PPM by Dr Rayann Heman  . Debilitated   . Diastolic dysfunction   . Hypertension   . Obesity   . Permanent atrial fibrillation ([place])   . Pulmonary hypertension   . Rheumatoid arthritis(714.0)   . Sleep apnea   . Stroke [facility])    remote  . Venous insufficiency    with chronic leg ulcers    Past Surgical History:  Procedure Laterality Date  . PACEMAKER INSERTION  08/13/10   SJM by Dr Rayann Heman    There were no vitals filed for this visit.                  Wound Therapy - 01/23/16 1246    Subjective Pt states that she feels better.     Patient and Family Stated Goals wounds to heal   Date of Onset 08/24/15   Wound Properties Date First Assessed: 09/24/15 Time First Assessed: 1133 Wound Type: Other (Comment) , venous stasis ulcer   Location: Leg Location Orientation: Right;Distal Wound Description (Comments): anterior wounds (in horseshoe appearance)  Present on Admission: Yes   Dressing Type Compression wrap  calcium alginate   Dressing Status Old drainage   Dressing Change Frequency PRN    Site / Wound Assessment Friable;Red   % Wound base Red or Granulating 100%   % Wound base Yellow/Fibrinous Exudate 0%   Peri-wound Assessment Hemosiderin   Margins Attached edges (approximated)   Drainage Amount Moderate   Drainage Description Serosanguineous   Wound Properties Date First Assessed: 01/03/16 Time First Assessed: 1234 Wound Type: Other (Comment) Location: Leg Location Orientation: Right Wound Description (Comments): 4 clustered wounds together on lateral LE Present on Admission: Yes   Dressing Type Gauze (Comment);Compression wrap  alginate   Dressing Changed Changed   Dressing Status Intact;Old drainage   Dressing Change Frequency PRN   Site / Wound Assessment Friable;Painful;Red;Pink;Bleeding   % Wound base Red or Granulating 100%   Peri-wound Assessment Erythema (blanchable);Hemosiderin   Margins Attached edges (approximated)   Drainage Amount Moderate   Drainage Description Serosanguineous   Treatment Cleansed;Debridement (Selective);Other (Comment)   Selective Debridement - Location Rt LE wounds    Selective Debridement - Tools Used Forceps   Selective Debridement - Tissue Removed slough and dry skin   Wound Therapy - Clinical Statement Pt wounds are 100% granulated but now has hypertrophic tissue.  Applied increased pressure at wound sites to attempt to decrease the hypertrophic tissue to allow healing  Factors Delaying/Impairing Wound Healing Infection - systemic/local;Polypharmacy;Vascular compromise   Hydrotherapy Plan Debridement;Dressing change   Wound Therapy - Frequency --  2X week for 6 more weeks   Wound Plan Continue with debridement and appropriate dressing conducive to environment for wound healing.    Dressing  xeroform followed by profore compression dressing.                    PT Short Term Goals - 01/03/16 1245      PT SHORT TERM GOAL #1   Title Pt to verbalize the importance of using compression every day to keep legs wound  free   Time 1   Period Weeks   Status Achieved     PT SHORT TERM GOAL #2   Title Pt to state that the pain in her legs are no greater than 3/10 to allow pt to ambulate for five minutes without stopping    Time 2   Period Weeks   Status Achieved     PT SHORT TERM GOAL #3   Title Pt to have only 5 wounds on Rt LE and 2 on Lt LE    Time 2   Period Weeks   Status Partially Met           PT Long Term Goals - 01/03/16 1245      PT LONG TERM GOAL #1   Title Pt to be wound free to reduce risk of infection    Time 4   Period Weeks   Status On-going     PT LONG TERM GOAL #2   Title Pt to have obtained compression garment and to be able to don and doff with minimal assist from family    Time 4   Period Weeks   Status On-going               Plan - 01/23/16 1251    Clinical Impression Statement see above   Rehab Potential Good   PT Frequency 2x / week   PT Duration 12 weeks  followed by one time a week for four weeks    PT Treatment/Interventions ADLs/Self Care Home Management;Gait training;Stair training;Therapeutic exercise;Patient/family education;Other (comment)  debridement and multilayer compression dressing    PT Next Visit Plan continue with  compression dressing to Rt LE; debridement if needed.  If pt continues not to need debridement will discharge to home health for dressing changes        Patient will benefit from skilled therapeutic intervention in order to improve the following deficits and impairments:  Pain, Increased edema, Difficulty walking, Decreased balance, Other (comment) (nonhealing wound )  Visit Diagnosis: Varicose veins of right lower extremity with ulcer other part of lower leg (HCC)  Pain in right lower leg     Problem List Patient Active Problem List   Diagnosis Date Noted  . Alzheimer's disease 10/11/2014  . Essential hypertension 10/11/2014  . Cardiomyopathy, dilated ([place]) 07/24/2014  . Apnea, sleep 07/24/2014  . Anemia  04/28/2014  . Chronic hypoxemic respiratory failure ([place]) 04/12/2014  . Chronic ulcer of leg ([place]) 04/12/2014  . Lymphedema of leg 04/12/2014  . Disorder of nutrition 04/12/2014  . Hypothyroidism 03/24/2014  . Stasis dermatitis of both legs 05/06/2013  . Pacemaker-St.Jude 11/25/2011  . Complete heart block ([place]) 12/12/2010  . Permanent atrial fibrillation ([place]) 12/12/2010  . Diastolic dysfunction 83/25/4982  . Venous insufficiency 12/12/2010  . Long term current use of anticoagulant 09/30/2002    Rayetta Humphrey, PT CLT  [phone] 01/23/2016, 12:52 PM  [place] [address] [place], [name], [address] Phone: [phone]   Fax:  [phone]  Name: [name] MRN: [id number] Date of Birth: [birth year]

## 2016-01-24 ENCOUNTER — Other Ambulatory Visit: Payer: Self-pay | Admitting: Family Medicine

## 2016-01-24 ENCOUNTER — Telehealth: Payer: Self-pay | Admitting: Cardiology

## 2016-01-24 DIAGNOSIS — J449 Chronic obstructive pulmonary disease, unspecified: Secondary | ICD-10-CM | POA: Diagnosis not present

## 2016-01-24 NOTE — Telephone Encounter (Signed)
Pt aware that we are out of samples here and [place] office of Eliquis - says she ran out today. Says she will take ASA 81 mg until Jan 1st when insurance pays for Eliquis again - explained importance of compliance in taking Eliquis and to call us prior to the day of running out and that I could not advise on ASA 81 mg in place of Eliquis only provider could do so. Pt voiced understanding and says she will pick up refills on Monday 01/28/16

## 2016-01-24 NOTE — Telephone Encounter (Signed)
Out of eliquis as of today and also in dough nut whole   Would like to pick up samples in [place] office

## 2016-01-31 ENCOUNTER — Ambulatory Visit (HOSPITAL_COMMUNITY): Payer: PPO | Attending: Family Medicine | Admitting: Physical Therapy

## 2016-01-31 DIAGNOSIS — L97819 Non-pressure chronic ulcer of other part of right lower leg with unspecified severity: Secondary | ICD-10-CM

## 2016-01-31 DIAGNOSIS — I890 Lymphedema, not elsewhere classified: Secondary | ICD-10-CM | POA: Diagnosis not present

## 2016-01-31 DIAGNOSIS — I83028 Varicose veins of left lower extremity with ulcer other part of lower leg: Secondary | ICD-10-CM | POA: Diagnosis not present

## 2016-01-31 DIAGNOSIS — M79661 Pain in right lower leg: Secondary | ICD-10-CM | POA: Insufficient documentation

## 2016-01-31 DIAGNOSIS — L97829 Non-pressure chronic ulcer of other part of left lower leg with unspecified severity: Secondary | ICD-10-CM

## 2016-01-31 DIAGNOSIS — R2681 Unsteadiness on feet: Secondary | ICD-10-CM | POA: Diagnosis not present

## 2016-01-31 DIAGNOSIS — L97901 Non-pressure chronic ulcer of unspecified part of unspecified lower leg limited to breakdown of skin: Secondary | ICD-10-CM | POA: Insufficient documentation

## 2016-01-31 DIAGNOSIS — I83018 Varicose veins of right lower extremity with ulcer other part of lower leg: Secondary | ICD-10-CM | POA: Insufficient documentation

## 2016-01-31 DIAGNOSIS — M79662 Pain in left lower leg: Secondary | ICD-10-CM | POA: Insufficient documentation

## 2016-01-31 DIAGNOSIS — I872 Venous insufficiency (chronic) (peripheral): Secondary | ICD-10-CM | POA: Diagnosis not present

## 2016-01-31 NOTE — Therapy (Signed)
[place], [name], [address] Phone: [phone]   Fax:  [phone]  Wound Care Therapy  Patient Details  Name: BENIGNA DELISI MRN: [id number] Date of Birth: 1933/08/17 Referring Provider: [name]   Encounter Date: 01/31/2016      PT End of Session - 01/31/16 1436    Visit Number 25   Number of Visits 26   Date for PT Re-Evaluation 02/08/16   Authorization Type [employer] : g code and recert done at visit 16   Authorization - Visit Number 25   Authorization - Number of Visits 26   PT Start Time 1038   PT Stop Time 1110   PT Time Calculation (min) 32 min   Activity Tolerance Patient tolerated treatment well   Behavior During Therapy [facility] for tasks assessed/performed      Past Medical History:  Diagnosis Date  . Chronic lung disease    on home O2   . Complete heart block (HCC)    s/p PPM by Dr Rayann Heman  . Debilitated   . Diastolic dysfunction   . Hypertension   . Obesity   . Permanent atrial fibrillation ([place])   . Pulmonary hypertension   . Rheumatoid arthritis(714.0)   . Sleep apnea   . Stroke [facility])    remote  . Venous insufficiency    with chronic leg ulcers    Past Surgical History:  Procedure Laterality Date  . PACEMAKER INSERTION  08/13/10   SJM by Dr Rayann Heman    There were no vitals filed for this visit.                  Wound Therapy - 01/31/16 1421    Subjective Pt reports no pain in her leg today.     Patient and Family Stated Goals wounds to heal   Date of Onset 08/24/15   Wound Properties Date First Assessed: 09/24/15 Time First Assessed: 1133 Wound Type: Other (Comment) , venous stasis ulcer   Location: Leg Location Orientation: Right;Distal Wound Description (Comments): anterior wounds (in horseshoe appearance)  Present on Admission: Yes   Dressing Type Compression wrap  calcium alginate   Dressing Changed Changed   Dressing Status Old drainage   Dressing Change Frequency PRN   Site / Wound Assessment Friable;Red   % Wound base Red or Granulating 100%   % Wound base Yellow/Fibrinous Exudate 0%   Peri-wound Assessment Hemosiderin   Margins Attached edges (approximated)   Drainage Amount Moderate   Drainage Description Serosanguineous   Treatment Cleansed;Debridement (Selective)   Wound Properties Date First Assessed: 01/03/16 Time First Assessed: 1234 Wound Type: Other (Comment) Location: Leg Location Orientation: Right Wound Description (Comments): 4 clustered wounds together on lateral LE Present on Admission: Yes   Dressing Type Gauze (Comment);Compression wrap  alginate   Dressing Changed Changed   Dressing Status Intact;Old drainage   Dressing Change Frequency PRN   Site / Wound Assessment Friable;Painful;Red;Pink;Bleeding   % Wound base Red or Granulating 100%   Peri-wound Assessment Erythema (blanchable);Hemosiderin   Margins Attached edges (approximated)   Drainage Amount Moderate   Drainage Description Serosanguineous   Treatment Cleansed;Debridement (Selective)   Selective Debridement - Location Rt LE wounds    Selective Debridement - Tools Used Forceps   Selective Debridement - Tissue Removed slough and dry skin   Wound Therapy - Clinical Statement Hypergranulation overall improved, however still present, especially on most lateral aspect of anterior wound.  Cleansed LE and  moisturized well.  continued with xeroform dressing with addition of calcium alginate on most lateral aspect to help decrease moisture and hypergranulation. Also contiued with compression to further decrease hypergranulation and improve circulation.  Pt reported overall comfort of dressing at end of session.    Factors Delaying/Impairing Wound Healing Infection - systemic/local;Polypharmacy;Vascular compromise   Hydrotherapy Plan Debridement;Dressing change   Wound Therapy - Frequency --  2X week for 6 more weeks   Wound Plan Continue with  debridement and appropriate dressing conducive to environment for wound healing.    Dressing  xeroform with addition of calcium alginate to most lateral aspect followed by profore compression dressing.                    PT Short Term Goals - 01/03/16 1245      PT SHORT TERM GOAL #1   Title Pt to verbalize the importance of using compression every day to keep legs wound free   Time 1   Period Weeks   Status Achieved     PT SHORT TERM GOAL #2   Title Pt to state that the pain in her legs are no greater than 3/10 to allow pt to ambulate for five minutes without stopping    Time 2   Period Weeks   Status Achieved     PT SHORT TERM GOAL #3   Title Pt to have only 5 wounds on Rt LE and 2 on Lt LE    Time 2   Period Weeks   Status Partially Met           PT Long Term Goals - 01/03/16 1245      PT LONG TERM GOAL #1   Title Pt to be wound free to reduce risk of infection    Time 4   Period Weeks   Status On-going     PT LONG TERM GOAL #2   Title Pt to have obtained compression garment and to be able to don and doff with minimal assist from family    Time 4   Period Weeks   Status On-going             Patient will benefit from skilled therapeutic intervention in order to improve the following deficits and impairments:     Visit Diagnosis: Varicose veins of right lower extremity with ulcer other part of lower leg (HCC)  Pain in right lower leg  Unsteadiness on feet  Pain in left lower leg  Varicose veins of left lower extremity with ulcer other part of lower leg [facility])     Problem List Patient Active Problem List   Diagnosis Date Noted  . Alzheimer's disease 10/11/2014  . Essential hypertension 10/11/2014  . Cardiomyopathy, dilated ([place]) 07/24/2014  . Apnea, sleep 07/24/2014  . Anemia 04/28/2014  . Chronic hypoxemic respiratory failure ([place]) 04/12/2014  . Chronic ulcer of leg ([place]) 04/12/2014  . Lymphedema of leg 04/12/2014  . Disorder  of nutrition 04/12/2014  . Hypothyroidism 03/24/2014  . Stasis dermatitis of both legs 05/06/2013  . Pacemaker-St.Jude 11/25/2011  . Complete heart block ([place]) 12/12/2010  . Permanent atrial fibrillation ([place]) 12/12/2010  . Diastolic dysfunction 84/69/6295  . Venous insufficiency 12/12/2010  . Long term current use of anticoagulant 09/30/2002    Teena Irani, PTA/CLT [phone]  01/31/2016, 2:38 PM  [place] [address] [place], [name], [address] Phone: [phone]   Fax:  [phone]  Name: [name]  MRN: [id number] Date of Birth: 11-24-1933

## 2016-02-07 ENCOUNTER — Ambulatory Visit (HOSPITAL_COMMUNITY): Payer: PPO | Admitting: Physical Therapy

## 2016-02-07 ENCOUNTER — Encounter: Payer: Self-pay | Admitting: Family Medicine

## 2016-02-07 DIAGNOSIS — I83018 Varicose veins of right lower extremity with ulcer other part of lower leg: Secondary | ICD-10-CM | POA: Diagnosis not present

## 2016-02-07 DIAGNOSIS — M79661 Pain in right lower leg: Secondary | ICD-10-CM

## 2016-02-07 DIAGNOSIS — L97819 Non-pressure chronic ulcer of other part of right lower leg with unspecified severity: Principal | ICD-10-CM

## 2016-02-07 DIAGNOSIS — I872 Venous insufficiency (chronic) (peripheral): Secondary | ICD-10-CM

## 2016-02-07 DIAGNOSIS — L97901 Non-pressure chronic ulcer of unspecified part of unspecified lower leg limited to breakdown of skin: Secondary | ICD-10-CM

## 2016-02-07 DIAGNOSIS — I890 Lymphedema, not elsewhere classified: Secondary | ICD-10-CM

## 2016-02-07 DIAGNOSIS — R2681 Unsteadiness on feet: Secondary | ICD-10-CM

## 2016-02-07 NOTE — Therapy (Signed)
[place], [name], [address] Phone: [phone]   Fax:  [phone]  Wound Care Therapy  Patient Details  Name: TAREKA JHAVERI MRN: [id number] Date of Birth: [birth year] Referring Provider: [name]   Encounter Date: 02/07/2016      PT End of Session - 02/07/16 1503    Visit Number 26   Number of Visits 34   Date for PT Re-Evaluation 03/08/16   Authorization Type [employer] : g code and recert done at visit 1   Authorization - Visit Number 26   Authorization - Number of Visits 34   PT Start Time 1120   PT Stop Time 1201   PT Time Calculation (min) 41 min   Activity Tolerance Patient tolerated treatment well   Behavior During Therapy [facility] for tasks assessed/performed      Past Medical History:  Diagnosis Date  . Chronic lung disease    on home O2   . Complete heart block (HCC)    s/p PPM by Dr Rayann Heman  . Debilitated   . Diastolic dysfunction   . Hypertension   . Obesity   . Permanent atrial fibrillation ([place])   . Pulmonary hypertension   . Rheumatoid arthritis(714.0)   . Sleep apnea   . Stroke [facility])    remote  . Venous insufficiency    with chronic leg ulcers    Past Surgical History:  Procedure Laterality Date  . PACEMAKER INSERTION  08/13/10   SJM by Dr Rayann Heman    There were no vitals filed for this visit.                  Wound Therapy - 02/07/16 1448    Subjective Pt states that her right leg has been more tender    Patient and Family Stated Goals wounds to heal   Date of Onset 08/24/15   Wound Properties Date First Assessed: 01/03/16 Time First Assessed: 1234 Wound Type: Other (Comment) Location: Leg Location Orientation: Right Wound Description (Comments): 4 clustered wounds together on lateral LE Present on Admission: Yes   Dressing Type Alginate;Compression wrap   Dressing Changed Changed   Dressing Status Old drainage   Dressing Change Frequency PRN    Site / Wound Assessment Friable;Painful;Red;Pink;Bleeding   % Wound base Red or Granulating 100%   Peri-wound Assessment Erythema (blanchable);Hemosiderin   Wound Length (cm) 7.5 cm  was 4.0 on 01/03/2016   Wound Width (cm) 5 cm  was 5.0 on 01/03/2016   Wound Depth (cm) 0 cm   Drainage Amount Moderate   Drainage Description Serosanguineous   Treatment Cleansed;Debridement (Selective)  had daughter call for antibiotic    Wound Properties Date First Assessed: 09/24/15 Time First Assessed: 1133 Wound Type: Other (Comment) , venous stasis ulcer   Location: Leg Location Orientation: Right;Distal Wound Description (Comments): anterior wounds (in horseshoe appearance)  Present on Admission: Yes   Dressing Type Alginate;Compression wrap   Dressing Changed Changed   Dressing Status Old drainage   Dressing Change Frequency PRN   Site / Wound Assessment Friable;Red   % Wound base Red or Granulating 100%   % Wound base Yellow/Fibrinous Exudate 0%   Wound Length (cm) 9 cm  was 10   Wound Width (cm) 7 cm  was 6   Drainage Amount Minimal   Drainage Description Serosanguineous   Treatment Cleansed;Debridement (Selective)   Selective Debridement - Location Rt LE    Selective Debridement - Tools Used Forceps  Selective Debridement - Tissue Removed --  slough   Wound Therapy - Clinical Statement Wounds continue to be hypergranulating; added increased pressure to lateral aspect. If this does not work pt may benefit from the addition of foam to the compression bandage for increased pressure as wounds will not heal with the hypergranulation.  Daughter called in more antibiotic due to redness around proximal aspect of wound .   Factors Delaying/Impairing Wound Healing Infection - systemic/local;Multiple medical problems;Vascular compromise   Hydrotherapy Plan Debridement;Dressing change;Patient/family education   Wound Therapy - Frequency --  continue 2x a week for 4 wks; if no improvement return MD if    Wound Plan Pt to begin on antibiotics again; if hypergranualtion has not subsided add foam to compression dressing.  If wounds are not decreasing in size return to MD   Dressing  profore mesh followed by alginate and application of profore.                   PT Education - 02/17/2016 1503    Education provided Yes   Education Details recommended pt daughter call MD to renew antibiotic.   Person(s) Educated Child(ren)   Methods Explanation   Comprehension Verbalized understanding          PT Short Term Goals - 01/03/16 1245      PT SHORT TERM GOAL #1   Title Pt to verbalize the importance of using compression every day to keep legs wound free   Time 1   Period Weeks   Status Achieved     PT SHORT TERM GOAL #2   Title Pt to state that the pain in her legs are no greater than 3/10 to allow pt to ambulate for five minutes without stopping    Time 2   Period Weeks   Status Achieved     PT SHORT TERM GOAL #3   Title Pt to have only 5 wounds on Rt LE and 2 on Lt LE    Time 2   Period Weeks   Status Partially Met           PT Long Term Goals - 01/03/16 1245      PT LONG TERM GOAL #1   Title Pt to be wound free to reduce risk of infection    Time 8   Period Weeks   Status On-going     PT LONG TERM GOAL #2   Title Pt to have obtained compression garment and to be able to don and doff with minimal assist from family    Time 8   Period Weeks   Status On-going             Patient will benefit from skilled therapeutic intervention in order to improve the following deficits and impairments:     Visit Diagnosis: Varicose veins of right lower extremity with ulcer other part of lower leg (HCC)  Venous insufficiency  Lymphedema of right lower extremity  Chronic ulcer of leg, limited to breakdown of skin, unspecified laterality (HCC)  Pain in right lower leg  Unsteadiness on feet      G-Codes - 02/17/2016 1505    Functional Limitation Other PT  primary   Other PT Primary Current Status (L4917) At least 40 percent but less than 60 percent impaired, limited or restricted   Other PT Primary Goal Status (H1505) At least 20 percent but less than 40 percent impaired, limited or restricted       Problem List Patient  Active Problem List   Diagnosis Date Noted  . Alzheimer's disease 10/11/2014  . Essential hypertension 10/11/2014  . Cardiomyopathy, dilated ([place]) 07/24/2014  . Apnea, sleep 07/24/2014  . Anemia 04/28/2014  . Chronic hypoxemic respiratory failure ([place]) 04/12/2014  . Chronic ulcer of leg ([place]) 04/12/2014  . Lymphedema of leg 04/12/2014  . Disorder of nutrition 04/12/2014  . Hypothyroidism 03/24/2014  . Stasis dermatitis of both legs 05/06/2013  . Pacemaker-St.Jude 11/25/2011  . Complete heart block ([place]) 12/12/2010  . Permanent atrial fibrillation ([place]) 12/12/2010  . Diastolic dysfunction 22/29/7989  . Venous insufficiency 12/12/2010  . Long term current use of anticoagulant 09/30/2002    Rayetta Humphrey, PT CLT [phone] 02/07/2016, 3:11 PM  [place] [address] [place], [name], [address] Phone: [phone]   Fax:  [phone]  Name: GRACELYN COVENTRY MRN: [id number] Date of Birth: March 18, 1933

## 2016-02-08 MED ORDER — DOXYCYCLINE HYCLATE 100 MG PO CAPS: 100.0000 mg | ORAL_CAPSULE | Freq: Two times a day (BID) | ORAL | 0 refills | Status: DC

## 2016-02-08 NOTE — Telephone Encounter (Signed)
Med sent to pharmacy as requested WS

## 2016-02-14 ENCOUNTER — Ambulatory Visit (HOSPITAL_COMMUNITY): Payer: PPO | Admitting: Physical Therapy

## 2016-02-15 ENCOUNTER — Ambulatory Visit (HOSPITAL_COMMUNITY): Payer: PPO | Admitting: Physical Therapy

## 2016-02-15 ENCOUNTER — Other Ambulatory Visit: Payer: Self-pay | Admitting: Family Medicine

## 2016-02-15 DIAGNOSIS — R2681 Unsteadiness on feet: Secondary | ICD-10-CM

## 2016-02-15 DIAGNOSIS — I890 Lymphedema, not elsewhere classified: Secondary | ICD-10-CM

## 2016-02-15 DIAGNOSIS — I83018 Varicose veins of right lower extremity with ulcer other part of lower leg: Secondary | ICD-10-CM | POA: Diagnosis not present

## 2016-02-15 DIAGNOSIS — M79661 Pain in right lower leg: Secondary | ICD-10-CM

## 2016-02-15 DIAGNOSIS — L97901 Non-pressure chronic ulcer of unspecified part of unspecified lower leg limited to breakdown of skin: Secondary | ICD-10-CM

## 2016-02-15 NOTE — Progress Notes (Signed)
Remote pacemaker transmission.   

## 2016-02-15 NOTE — Addendum Note (Signed)
Addended by: Tiajuana Amass on: 02/15/2016 03:45 PM   Modules accepted: Level of Service

## 2016-02-16 NOTE — Therapy (Signed)
[facility] [address] [place], [name], [address] Phone: [phone]   Fax:  [phone]  Wound Care Therapy  Patient Details  Name: [name] MRN: [id number] Date of Birth: 05-27-1933 Referring Provider: [name]   Encounter Date: 02/15/2016      PT End of Session - 02/15/16 1859    Visit Number 27   Number of Visits 34   Date for PT Re-Evaluation 03/08/16   Authorization Type [employer] : g code and recert done at visit 87   Authorization - Visit Number 53   Authorization - Number of Visits 34   PT Start Time 1450   PT Stop Time 1530   PT Time Calculation (min) 40 min   Activity Tolerance Patient tolerated treatment well   Behavior During Therapy [facility] for tasks assessed/performed      Past Medical History:  Diagnosis Date  . Chronic lung disease    on home O2   . Complete heart block (HCC)    s/p PPM by Dr Rayann Heman  . Debilitated   . Diastolic dysfunction   . Hypertension   . Obesity   . Permanent atrial fibrillation ([place])   . Pulmonary hypertension   . Rheumatoid arthritis(714.0)   . Sleep apnea   . Stroke [facility])    remote  . Venous insufficiency    with chronic leg ulcers    Past Surgical History:  Procedure Laterality Date  . PACEMAKER INSERTION  08/13/10   SJM by Dr Rayann Heman    There were no vitals filed for this visit.                  Wound Therapy - 02/15/16 1847    Subjective Pt daughter states that her mothers husband died last week, therefore things have been very hectic and she forgot to pick up her mothers antibiotics.  On top of this her mother was standing a lot at the hospital and then at the funeral; due to the above her mothers leg is looking much worse.    Patient and Family Stated Goals wounds to heal   Date of Onset 08/24/15   Wound Properties Date First Assessed: 01/03/16 Time First Assessed: 1234 Wound Type: Other (Comment) Location: Leg Location  Orientation: Right Wound Description (Comments): 4 clustered wounds together on lateral LE Present on Admission: Yes   Dressing Type Compression wrap;Foam;Impregnated gauze (bismuth)   Dressing Changed Changed   Dressing Status Old drainage   Dressing Change Frequency PRN   Site / Wound Assessment Friable;Painful;Red;Pink;Bleeding   % Wound base Red or Granulating 100%   Peri-wound Assessment Erythema (blanchable);Hemosiderin   Drainage Amount Moderate   Drainage Description Sanguineous   Treatment Cleansed;Debridement (Selective)   Wound Properties Date First Assessed: 09/24/15 Time First Assessed: 1133 Wound Type: Other (Comment) , venous stasis ulcer   Location: Leg Location Orientation: Right;Distal Wound Description (Comments): anterior wounds (in horseshoe appearance)  Present on Admission: Yes   Dressing Type Compression wrap;Foam;Impregnated gauze (bismuth)   Dressing Changed Changed   Dressing Status Old drainage   Dressing Change Frequency PRN   Site / Wound Assessment Friable;Red   % Wound base Red or Granulating 100%   % Wound base Yellow/Fibrinous Exudate 0%   Drainage Amount Moderate   Drainage Description Sanguineous   Treatment Cleansed;Debridement (Selective)   Selective Debridement - Location Rt LE    Selective Debridement - Tools Used Forceps   Selective Debridement - Tissue Removed --  slough   Wound Therapy - Clinical Statement Wound appears to have increased in size.  Wounds continue to by hypergranulated.  Dressing was sticking to wound with significant amount of bleeding with the removal of the dressing due to the adherent nature of the dressing despite therapist attempting to soak the dresing off.  Changed dressing to xeroform followed by kerlix.,followed by foam for increased compression,kerlix and coban.     Factors Delaying/Impairing Wound Healing Infection - systemic/local;Multiple medical problems;Vascular compromise   Hydrotherapy Plan Debridement;Dressing  change;Patient/family education   Wound Therapy - Frequency --  continue 2x a week for 4 wks; if no improvement return MD if   Wound Plan Pt started her antibiotics today.  Assess wound if it continues to be increasing in size increase pt back to twice a week, if wound is stable may continue and once a week.  Assess how foam does in attempt to decrease hypergranulation.   Dressing  profore mesh followed by alginate and application of profore.                     PT Short Term Goals - 02/15/16 1900      PT SHORT TERM GOAL #1   Title Pt to verbalize the importance of using compression every day to keep legs wound free   Time 1   Period Weeks   Status Achieved     PT SHORT TERM GOAL #2   Title Pt to state that the pain in her legs are no greater than 3/10 to allow pt to ambulate for five minutes without stopping    Time 2   Period Weeks   Status Achieved     PT SHORT TERM GOAL #3   Title Pt to have only 5 wounds on Rt LE and 2 on Lt LE    Time 2   Period Weeks   Status Partially Met           PT Long Term Goals - 02/15/16 1900      PT LONG TERM GOAL #1   Title Pt to be wound free to reduce risk of infection    Time 4   Period Weeks   Status On-going     PT LONG TERM GOAL #2   Title Pt to have obtained compression garment and to be able to don and doff with minimal assist from family    Time 4   Period Weeks   Status On-going               Plan - 02/15/16 1859    Clinical Impression Statement see above   Rehab Potential Good   PT Frequency 2x / week   PT Duration 12 weeks  followed by one time a week for four weeks    PT Treatment/Interventions ADLs/Self Care Home Management;Gait training;Stair training;Therapeutic exercise;Patient/family education;Other (comment)  debridement and multilayer compression dressing    PT Next Visit Plan continue with  compression dressing to Rt LE; debridement if needed.  If pt continues not to need debridement will  discharge to home health for dressing changes        Patient will benefit from skilled therapeutic intervention in order to improve the following deficits and impairments:  Pain, Increased edema, Difficulty walking, Decreased balance, Other (comment) (nonhealing wound )  Visit Diagnosis: Lymphedema of right lower extremity  Chronic ulcer of leg, limited to breakdown of skin, unspecified laterality (HCC)  Pain in right lower leg  Unsteadiness on  feet     Problem List Patient Active Problem List   Diagnosis Date Noted  . Alzheimer's disease 10/11/2014  . Essential hypertension 10/11/2014  . Cardiomyopathy, dilated ([place]) 07/24/2014  . Apnea, sleep 07/24/2014  . Anemia 04/28/2014  . Chronic hypoxemic respiratory failure ([place]) 04/12/2014  . Chronic ulcer of leg ([place]) 04/12/2014  . Lymphedema of leg 04/12/2014  . Disorder of nutrition 04/12/2014  . Hypothyroidism 03/24/2014  . Stasis dermatitis of both legs 05/06/2013  . Pacemaker-St.Jude 11/25/2011  . Complete heart block ([place]) 12/12/2010  . Permanent atrial fibrillation ([place]) 12/12/2010  . Diastolic dysfunction 46/56/8127  . Venous insufficiency 12/12/2010  . Long term current use of anticoagulant 09/30/2002   Rayetta Humphrey, PT CLT [phone] 02/15/2016, 7:01 PM  [facility] [address] [place], [name], [address] Phone: [phone]   Fax:  [phone]  Name: [name] MRN: [id number] Date of Birth: [birth year]

## 2016-02-18 ENCOUNTER — Ambulatory Visit (HOSPITAL_COMMUNITY): Payer: PPO | Admitting: Physical Therapy

## 2016-02-18 DIAGNOSIS — L97901 Non-pressure chronic ulcer of unspecified part of unspecified lower leg limited to breakdown of skin: Secondary | ICD-10-CM

## 2016-02-18 DIAGNOSIS — I83018 Varicose veins of right lower extremity with ulcer other part of lower leg: Secondary | ICD-10-CM | POA: Diagnosis not present

## 2016-02-18 DIAGNOSIS — M79661 Pain in right lower leg: Secondary | ICD-10-CM

## 2016-02-18 DIAGNOSIS — I890 Lymphedema, not elsewhere classified: Secondary | ICD-10-CM

## 2016-02-18 NOTE — Therapy (Signed)
[facility] [address] [place], [name], [address] Phone: [phone]   Fax:  [phone]  Wound Care Therapy  Patient Details  Name: [name] MRN: [id number] Date of Birth: May 30, 1933 Referring Provider: [name]   Encounter Date: 02/18/2016      PT End of Session - 02/18/16 1511    Visit Number 28   Number of Visits 34   Date for PT Re-Evaluation 03/08/16   Authorization Type [employer] : g code and recert done at visit 43   Authorization - Visit Number 28   Authorization - Number of Visits 34   PT Start Time 1304   PT Stop Time 1350   PT Time Calculation (min) 46 min   Activity Tolerance Patient tolerated treatment well   Behavior During Therapy [facility] for tasks assessed/performed      Past Medical History:  Diagnosis Date  . Chronic lung disease    on home O2   . Complete heart block (HCC)    s/p PPM by Dr Rayann Heman  . Debilitated   . Diastolic dysfunction   . Hypertension   . Obesity   . Permanent atrial fibrillation ([place])   . Pulmonary hypertension   . Rheumatoid arthritis(714.0)   . Sleep apnea   . Stroke [facility])    remote  . Venous insufficiency    with chronic leg ulcers    Past Surgical History:  Procedure Laterality Date  . PACEMAKER INSERTION  08/13/10   SJM by Dr Rayann Heman    There were no vitals filed for this visit.                  Wound Therapy - 02/18/16 1505    Subjective Daughter states she has been taking her antibiotics and patient reports her leg already feels better.  No pain currently.    Patient and Family Stated Goals wounds to heal   Date of Onset 08/24/15   Pain Assessment No/denies pain   Wound Properties Date First Assessed: 01/03/16 Time First Assessed: 1234 Wound Type: Other (Comment) Location: Leg Location Orientation: Right Wound Description (Comments): 4 clustered wounds together on lateral LE Present on Admission: Yes   Dressing Type Compression  wrap;Foam;Impregnated gauze (bismuth)   Dressing Changed Changed   Dressing Status Old drainage   Dressing Change Frequency PRN   Site / Wound Assessment Friable;Painful;Red;Pink;Bleeding   % Wound base Red or Granulating 100%   Peri-wound Assessment Erythema (blanchable);Hemosiderin   Drainage Amount Minimal   Drainage Description Sanguineous   Treatment Cleansed;Debridement (Selective)   Wound Properties Date First Assessed: 09/24/15 Time First Assessed: 1133 Wound Type: Other (Comment) , venous stasis ulcer   Location: Leg Location Orientation: Right;Distal Wound Description (Comments): anterior wounds (in horseshoe appearance)  Present on Admission: Yes   Dressing Type Compression wrap;Foam;Impregnated gauze (bismuth)   Dressing Changed Changed   Dressing Status Old drainage   Dressing Change Frequency PRN   Site / Wound Assessment Friable;Red   % Wound base Red or Granulating 100%   % Wound base Yellow/Fibrinous Exudate 0%   Drainage Amount Minimal   Drainage Description Sanguineous   Treatment Cleansed;Debridement (Selective)   Selective Debridement - Location Rt LE    Selective Debridement - Tools Used Forceps   Selective Debridement - Tissue Removed --  slough   Wound Therapy - Clinical Statement Dressings stuck to wound bed this session.  Soaked to remove and with only minimal bleeding following.  Cleansed LE well  and debrided all non viable tissue from perimeter.  Wounds continue to remain 100% granulated and without hypergranulation this session.  Compression and addition of foam helped to achieve this.  New foam cut as drainage had come through into foam.  Added hydrogel to wound prior to placing double layer xeroform.  ABD pads used over this to absorb excess drainage.  1/2" foam used with profore system to increase compression and aide with healing.  Pt reported overall comfort at end of session.    Factors Delaying/Impairing Wound Healing Infection - systemic/local;Multiple  medical problems;Vascular compromise   Hydrotherapy Plan Debridement;Dressing change;Patient/family education   Wound Therapy - Frequency --  continue 2x a week for 4 wks; if no improvement return MD if   Wound Plan Continue per current plan as wounds are improving, 1X week.    Dressing  hydrogel, xeroform, ABD pad, kerlix, foam and profore system                   PT Short Term Goals - 02/15/16 1900      PT SHORT TERM GOAL #1   Title Pt to verbalize the importance of using compression every day to keep legs wound free   Time 1   Period Weeks   Status Achieved     PT SHORT TERM GOAL #2   Title Pt to state that the pain in her legs are no greater than 3/10 to allow pt to ambulate for five minutes without stopping    Time 2   Period Weeks   Status Achieved     PT SHORT TERM GOAL #3   Title Pt to have only 5 wounds on Rt LE and 2 on Lt LE    Time 2   Period Weeks   Status Partially Met           PT Long Term Goals - 02/15/16 1900      PT LONG TERM GOAL #1   Title Pt to be wound free to reduce risk of infection    Time 4   Period Weeks   Status On-going     PT LONG TERM GOAL #2   Title Pt to have obtained compression garment and to be able to don and doff with minimal assist from family    Time 4   Period Weeks   Status On-going             Patient will benefit from skilled therapeutic intervention in order to improve the following deficits and impairments:     Visit Diagnosis: Lymphedema of right lower extremity  Chronic ulcer of leg, limited to breakdown of skin, unspecified laterality (HCC)  Pain in right lower leg     Problem List Patient Active Problem List   Diagnosis Date Noted  . Alzheimer's disease 10/11/2014  . Essential hypertension 10/11/2014  . Cardiomyopathy, dilated ([place]) 07/24/2014  . Apnea, sleep 07/24/2014  . Anemia 04/28/2014  . Chronic hypoxemic respiratory failure ([place]) 04/12/2014  . Chronic ulcer of leg ([place])  04/12/2014  . Lymphedema of leg 04/12/2014  . Disorder of nutrition 04/12/2014  . Hypothyroidism 03/24/2014  . Stasis dermatitis of both legs 05/06/2013  . Pacemaker-St.Jude 11/25/2011  . Complete heart block ([place]) 12/12/2010  . Permanent atrial fibrillation ([place]) 12/12/2010  . Diastolic dysfunction 46/65/9935  . Venous insufficiency 12/12/2010  . Long term current use of anticoagulant 09/30/2002    Teena Irani, PTA/CLT [phone]  02/18/2016, 3:12 PM  [facility]  [place], [name], [address] Phone: [phone]   Fax:  [phone]  Name: [name] MRN: [id number] Date of Birth: [birth year]

## 2016-02-21 ENCOUNTER — Ambulatory Visit (HOSPITAL_COMMUNITY): Payer: PPO | Admitting: Physical Therapy

## 2016-02-21 DIAGNOSIS — I872 Venous insufficiency (chronic) (peripheral): Secondary | ICD-10-CM

## 2016-02-21 DIAGNOSIS — I83018 Varicose veins of right lower extremity with ulcer other part of lower leg: Secondary | ICD-10-CM | POA: Diagnosis not present

## 2016-02-21 DIAGNOSIS — L97901 Non-pressure chronic ulcer of unspecified part of unspecified lower leg limited to breakdown of skin: Secondary | ICD-10-CM

## 2016-02-21 DIAGNOSIS — I890 Lymphedema, not elsewhere classified: Secondary | ICD-10-CM

## 2016-02-21 DIAGNOSIS — M79661 Pain in right lower leg: Secondary | ICD-10-CM

## 2016-02-21 DIAGNOSIS — R2681 Unsteadiness on feet: Secondary | ICD-10-CM

## 2016-02-21 NOTE — Therapy (Signed)
[facility] [address] [place], [name], [address] Phone: [phone]   Fax:  [phone]  Wound Care Therapy  Patient Details  Name: [name] MRN: [id number] Date of Birth: 1934/01/23 Referring Provider: [name]   Encounter Date: 02/21/2016      PT End of Session - 02/21/16 1124    Visit Number 29   Number of Visits 34   Date for PT Re-Evaluation 03/08/16   Authorization Type [employer] : g code and recert done at visit 89   Authorization - Visit Number 29   Authorization - Number of Visits 34   PT Start Time 1033   PT Stop Time 1108   PT Time Calculation (min) 35 min   Activity Tolerance Patient tolerated treatment well   Behavior During Therapy [facility] for tasks assessed/performed      Past Medical History:  Diagnosis Date  . Chronic lung disease    on home O2   . Complete heart block (HCC)    s/p PPM by Dr Rayann Heman  . Debilitated   . Diastolic dysfunction   . Hypertension   . Obesity   . Permanent atrial fibrillation ([place])   . Pulmonary hypertension   . Rheumatoid arthritis(714.0)   . Sleep apnea   . Stroke [facility])    remote  . Venous insufficiency    with chronic leg ulcers    Past Surgical History:  Procedure Laterality Date  . PACEMAKER INSERTION  08/13/10   SJM by Dr Rayann Heman    There were no vitals filed for this visit.                  Wound Therapy - 02/21/16 1121    Subjective doing well today with dressing intact and no pain reported   Patient and Family Stated Goals wounds to heal   Date of Onset 08/24/15   Pain Assessment No/denies pain   Wound Properties Date First Assessed: 01/03/16 Time First Assessed: 1234 Wound Type: Other (Comment) Location: Leg Location Orientation: Right Wound Description (Comments): 4 clustered wounds together on lateral LE Present on Admission: Yes   Dressing Type Compression wrap;Foam;Impregnated gauze (bismuth)   Dressing Changed Changed    Dressing Status Old drainage   Dressing Change Frequency PRN   Site / Wound Assessment Friable;Painful;Red;Pink;Bleeding   % Wound base Red or Granulating 100%   Peri-wound Assessment Erythema (blanchable);Hemosiderin   Drainage Amount Moderate   Drainage Description Sanguineous   Treatment Cleansed;Debridement (Selective)   Wound Properties Date First Assessed: 09/24/15 Time First Assessed: 1133 Wound Type: Other (Comment) , venous stasis ulcer   Location: Leg Location Orientation: Right;Distal Wound Description (Comments): anterior wounds (in horseshoe appearance)  Present on Admission: Yes   Dressing Type Compression wrap;Foam;Impregnated gauze (bismuth)   Dressing Changed Changed   Dressing Status Old drainage   Dressing Change Frequency PRN   Site / Wound Assessment Friable;Red   % Wound base Red or Granulating 100%   % Wound base Yellow/Fibrinous Exudate 0%   Drainage Amount Moderate   Drainage Description Sanguineous   Treatment Cleansed;Debridement (Selective)   Selective Debridement - Location Rt LE    Selective Debridement - Tools Used Forceps   Selective Debridement - Tissue Removed --  slough   Wound Therapy - Clinical Statement Dressings less adhesive to wounds with added hydrogel to xeroform, however noted return of hypergranulation and increased drainage.  Changed dressing back to calcium alginate (not with silver).  Moisturized well and  redressed using profore and foam to LE.  Also used ABD pad to absorb more drainage.     Factors Delaying/Impairing Wound Healing Infection - systemic/local;Multiple medical problems;Vascular compromise   Hydrotherapy Plan Debridement;Dressing change;Patient/family education   Wound Therapy - Frequency --  continue 2x a week for 4 wks; if no improvement return MD if   Wound Plan Continue per current plan as wounds are improving, 1X week.    Dressing  calcium alginate (no silver), ABD pad, kerlix, foam and profore system                    PT Short Term Goals - 02/15/16 1900      PT SHORT TERM GOAL #1   Title Pt to verbalize the importance of using compression every day to keep legs wound free   Time 1   Period Weeks   Status Achieved     PT SHORT TERM GOAL #2   Title Pt to state that the pain in her legs are no greater than 3/10 to allow pt to ambulate for five minutes without stopping    Time 2   Period Weeks   Status Achieved     PT SHORT TERM GOAL #3   Title Pt to have only 5 wounds on Rt LE and 2 on Lt LE    Time 2   Period Weeks   Status Partially Met           PT Long Term Goals - 02/15/16 1900      PT LONG TERM GOAL #1   Title Pt to be wound free to reduce risk of infection    Time 4   Period Weeks   Status On-going     PT LONG TERM GOAL #2   Title Pt to have obtained compression garment and to be able to don and doff with minimal assist from family    Time 4   Period Weeks   Status On-going             Patient will benefit from skilled therapeutic intervention in order to improve the following deficits and impairments:     Visit Diagnosis: Lymphedema of right lower extremity  Chronic ulcer of leg, limited to breakdown of skin, unspecified laterality (HCC)  Pain in right lower leg  Unsteadiness on feet  Venous insufficiency     Problem List Patient Active Problem List   Diagnosis Date Noted  . Alzheimer's disease 10/11/2014  . Essential hypertension 10/11/2014  . Cardiomyopathy, dilated ([place]) 07/24/2014  . Apnea, sleep 07/24/2014  . Anemia 04/28/2014  . Chronic hypoxemic respiratory failure ([place]) 04/12/2014  . Chronic ulcer of leg ([place]) 04/12/2014  . Lymphedema of leg 04/12/2014  . Disorder of nutrition 04/12/2014  . Hypothyroidism 03/24/2014  . Stasis dermatitis of both legs 05/06/2013  . Pacemaker-St.Jude 11/25/2011  . Complete heart block ([place]) 12/12/2010  . Permanent atrial fibrillation ([place]) 12/12/2010  . Diastolic dysfunction  15/94/5859  . Venous insufficiency 12/12/2010  . Long term current use of anticoagulant 09/30/2002    Teena Irani, PTA/CLT [phone]  02/21/2016, 11:25 AM  [facility] [address] [place], [name], [address] Phone: [phone]   Fax:  [phone]  Name: KANSAS SPAINHOWER MRN: [id number] Date of Birth: Apr 04, 1933

## 2016-02-22 ENCOUNTER — Telehealth: Payer: Self-pay | Admitting: Family Medicine

## 2016-02-22 MED ORDER — MEMANTINE HCL 10 MG PO TABS: 10.0000 mg | ORAL_TABLET | Freq: Two times a day (BID) | ORAL | 5 refills | Status: DC

## 2016-02-22 NOTE — Telephone Encounter (Signed)
Pt cannot afford Namenda XR [facility] If changed to Namenda 10 bid it will be much cheaper Okay to change to generic per Dr Evette Doffing

## 2016-02-24 DIAGNOSIS — J449 Chronic obstructive pulmonary disease, unspecified: Secondary | ICD-10-CM | POA: Diagnosis not present

## 2016-02-25 ENCOUNTER — Ambulatory Visit (INDEPENDENT_AMBULATORY_CARE_PROVIDER_SITE_OTHER): Payer: PPO | Admitting: Family Medicine

## 2016-02-25 ENCOUNTER — Encounter: Payer: Self-pay | Admitting: Family Medicine

## 2016-02-25 VITALS — BP 118/63 | HR 91 | Temp 97.9°F | Ht 59.0 in | Wt 139.0 lb

## 2016-02-25 DIAGNOSIS — I4821 Permanent atrial fibrillation: Secondary | ICD-10-CM

## 2016-02-25 DIAGNOSIS — J9611 Chronic respiratory failure with hypoxia: Secondary | ICD-10-CM

## 2016-02-25 DIAGNOSIS — E039 Hypothyroidism, unspecified: Secondary | ICD-10-CM

## 2016-02-25 DIAGNOSIS — I482 Chronic atrial fibrillation: Secondary | ICD-10-CM

## 2016-02-25 DIAGNOSIS — G301 Alzheimer's disease with late onset: Secondary | ICD-10-CM

## 2016-02-25 DIAGNOSIS — R059 Cough, unspecified: Secondary | ICD-10-CM

## 2016-02-25 DIAGNOSIS — F0280 Dementia in other diseases classified elsewhere without behavioral disturbance: Secondary | ICD-10-CM

## 2016-02-25 DIAGNOSIS — R05 Cough: Secondary | ICD-10-CM

## 2016-02-25 MED ORDER — CEFUROXIME AXETIL 250 MG PO TABS: 250.0000 mg | ORAL_TABLET | Freq: Two times a day (BID) | ORAL | 0 refills | Status: AC

## 2016-02-25 MED ORDER — AMOXICILLIN-POT CLAVULANATE 875-125 MG PO TABS: 1.0000 | ORAL_TABLET | Freq: Two times a day (BID) | ORAL | 0 refills | Status: DC

## 2016-02-25 NOTE — Progress Notes (Signed)
Subjective:  Patient ID: [name], female    DOB: [birth year]  Age: 83 y.o. MRN: [id number]  CC: Cough (pt here today c/o cough since Friday)   HPI [name] presents for Patient presents with upper respiratory congestion. There is no sore throat. Patient reports coughing frequently as well.-Loose but nonproductive. There is no fever no chills no sweats. The patient has ongoing dyspnea from cardiac cause. Remains at baseline. Onset was 3-5 days ago. Gradually worsening in spite of home remedies.Daughter expresses concern for her condition based on her cardiomyopathy and atrial fibrillation as well as patient's inability to express her needs caused by her Alzheimer's dementia.    History [name] has a past medical history of Chronic lung disease; Complete heart block ([place]); Debilitated; Diastolic dysfunction; Hypertension; Obesity; Permanent atrial fibrillation ([place]); Pulmonary hypertension; Rheumatoid arthritis(714.0); Sleep apnea; Stroke [facility]); and Venous insufficiency.   She has a past surgical history that includes Pacemaker insertion (08/13/10).   Her family history is not on file.She reports that she has never smoked. She has never used smokeless tobacco. She reports that she does not drink alcohol or use drugs.    ROS Review of Systems  Constitutional: Negative for appetite change, chills, diaphoresis, fatigue and fever.  HENT: Negative for congestion, ear pain, hearing loss, postnasal drip, rhinorrhea, sore throat and trouble swallowing.   Respiratory: Positive for cough. Negative for chest tightness, shortness of breath and stridor.   Cardiovascular: Negative for chest pain and palpitations.  Gastrointestinal: Negative for abdominal pain.  Musculoskeletal: Negative for arthralgias.  Skin: Negative for rash.    Objective:  BP 118/63   Pulse 91   Temp 97.9 F (36.6 C) (Oral)   Ht 4\' 11"  (1.499 m)   Wt 139 lb (63 kg)   BMI 28.07 kg/m   BP Readings from Last  3 Encounters:  02/25/16 118/63  12/19/15 123/86  12/10/15 128/70    Wt Readings from Last 3 Encounters:  02/25/16 139 lb (63 kg)  12/19/15 141 lb 6.4 oz (64.1 kg)  12/10/15 146 lb (66.2 kg)     Physical Exam  Constitutional: She appears well-developed and well-nourished.  HENT:  Head: Normocephalic and atraumatic.  Right Ear: Tympanic membrane and external ear normal. No decreased hearing is noted.  Left Ear: Tympanic membrane and external ear normal. No decreased hearing is noted.  Nose: Mucosal edema present. Right sinus exhibits no frontal sinus tenderness. Left sinus exhibits no frontal sinus tenderness.  Mouth/Throat: No oropharyngeal exudate or posterior oropharyngeal erythema.  Neck: No Brudzinski's sign noted.  Pulmonary/Chest: Effort normal. No respiratory distress. She has wheezes (occ.). She has no rales. She exhibits no tenderness.  O2 cannula in place with audible gas flow.  Lymphadenopathy:       Head (right side): No preauricular adenopathy present.       Head (left side): No preauricular adenopathy present.       Right cervical: No superficial cervical adenopathy present.      Left cervical: No superficial cervical adenopathy present.    No results found.  Assessment & Plan:   [name] was seen today for cough.  Diagnoses and all orders for this visit:  Hypothyroidism, unspecified type  Chronic hypoxemic respiratory failure ([place]) -     Cancel: Blood gas, arterial  Permanent atrial fibrillation ([place])  Late onset Alzheimer's disease without behavioral disturbance  Cough  Other orders -     Cancel: Blood Gas, Arterial -     Cancel: MR Brain  W Contrast -     Cancel: MR Brain W Wo Contrast -     Discontinue: amoxicillin-clavulanate (AUGMENTIN) 875-125 MG tablet; Take 1 tablet by mouth 2 (two) times daily. Take all of this medication -     cefUROXime (CEFTIN) 250 MG tablet; Take 1 tablet (250 mg total) by mouth 2 (two) times daily with a meal.      I  have discontinued [name]'s doxycycline and amoxicillin-clavulanate. I am also having her start on cefUROXime. Additionally, I am having her maintain her OXYGEN-HELIUM IN, ferrous sulfate, multivitamin with minerals, traMADol, potassium chloride, donepezil, levothyroxine, hydrocortisone, apixaban, furosemide, simvastatin, and memantine.  Allergies as of 02/25/2016   No Known Allergies     Medication List       Accurate as of 02/25/16  2:38 PM. Always use your most recent med list.          apixaban 5 MG Tabs tablet Commonly known as:  ELIQUIS Take 1 tablet (5 mg total) by mouth 2 (two) times daily.   cefUROXime 250 MG tablet Commonly known as:  CEFTIN Take 1 tablet (250 mg total) by mouth 2 (two) times daily with a meal.   donepezil 10 MG tablet Commonly known as:  ARICEPT TAKE ONE TABLET DAILY AT BEDTIME   ferrous sulfate 325 (65 FE) MG EC tablet Take 1 tablet (325 mg total) by mouth 3 (three) times daily with meals.   furosemide 40 MG tablet Commonly known as:  LASIX TAKE 1 TABLET EVERY MORNING AND TAKE 1/2 TABLET AT LUNCH   hydrocortisone 2.5 % rectal cream Commonly known as:  PROCTOZONE-HC Place 1 application rectally 2 (two) times daily.   levothyroxine 50 MCG tablet Commonly known as:  SYNTHROID, LEVOTHROID TAKE ONE (1) TABLET EACH DAY   memantine 10 MG tablet Commonly known as:  NAMENDA Take 1 tablet (10 mg total) by mouth 2 (two) times daily.   multivitamin with minerals tablet Take 1 tablet by mouth daily.   OXYGEN Inhale into the lungs continuous. 4L   potassium chloride 10 MEQ tablet Commonly known as:  K-DUR,KLOR-CON Take 1 tablet (10 mEq total) by mouth daily.   simvastatin 20 MG tablet Commonly known as:  ZOCOR TAKE ONE (1) TABLET EACH DAY   traMADol 50 MG tablet Commonly known as:  ULTRAM Take 1 tablet (50 mg total) by mouth 3 (three) times daily as needed for moderate pain.        Follow-up: No Follow-up on file.  [name], M.D.

## 2016-02-28 ENCOUNTER — Ambulatory Visit (HOSPITAL_COMMUNITY): Payer: PPO | Attending: Family Medicine | Admitting: Physical Therapy

## 2016-02-28 DIAGNOSIS — I872 Venous insufficiency (chronic) (peripheral): Secondary | ICD-10-CM | POA: Diagnosis not present

## 2016-02-28 DIAGNOSIS — M79661 Pain in right lower leg: Secondary | ICD-10-CM

## 2016-02-28 DIAGNOSIS — L97901 Non-pressure chronic ulcer of unspecified part of unspecified lower leg limited to breakdown of skin: Secondary | ICD-10-CM | POA: Diagnosis not present

## 2016-02-28 DIAGNOSIS — R2681 Unsteadiness on feet: Secondary | ICD-10-CM | POA: Diagnosis not present

## 2016-02-28 DIAGNOSIS — I83018 Varicose veins of right lower extremity with ulcer other part of lower leg: Secondary | ICD-10-CM | POA: Insufficient documentation

## 2016-02-28 DIAGNOSIS — I890 Lymphedema, not elsewhere classified: Secondary | ICD-10-CM | POA: Insufficient documentation

## 2016-02-28 NOTE — Therapy (Signed)
[facility] [address] [place], [name], [address] Phone: [phone]   Fax:  [phone]  Wound Care Therapy  Patient Details  Name: [name] MRN: [id number] Date of Birth: Jul 27, 1933 Referring Provider: [name]   Encounter Date: 02/28/2016      PT End of Session - 02/28/16 1212    Visit Number 30   Number of Visits 34   Date for PT Re-Evaluation 03/08/16   Authorization Type [employer] : g code and recert done at visit 54   Authorization - Visit Number 30   Authorization - Number of Visits 34   PT Start Time 3662   PT Stop Time 1120   PT Time Calculation (min) 45 min   Activity Tolerance Patient tolerated treatment well   Behavior During Therapy [facility] for tasks assessed/performed      Past Medical History:  Diagnosis Date  . Chronic lung disease    on home O2   . Complete heart block (HCC)    s/p PPM by Dr Rayann Heman  . Debilitated   . Diastolic dysfunction   . Hypertension   . Obesity   . Permanent atrial fibrillation ([place])   . Pulmonary hypertension   . Rheumatoid arthritis(714.0)   . Sleep apnea   . Stroke [facility])    remote  . Venous insufficiency    with chronic leg ulcers    Past Surgical History:  Procedure Laterality Date  . PACEMAKER INSERTION  08/13/10   SJM by Dr Rayann Heman    There were no vitals filed for this visit.                  Wound Therapy - 02/28/16 1211    Subjective no pain or issues to report   Patient and Family Stated Goals wounds to heal   Date of Onset 08/24/15   Pain Assessment No/denies pain   Wound Properties Date First Assessed: 01/03/16 Time First Assessed: 1234 Wound Type: Other (Comment) Location: Leg Location Orientation: Right Wound Description (Comments): 4 clustered wounds together on lateral LE Present on Admission: Yes   Dressing Type Compression wrap;Foam;Impregnated gauze (bismuth)   Dressing Changed Changed   Dressing Status Old drainage    Dressing Change Frequency PRN   Site / Wound Assessment Friable;Painful;Red;Pink;Bleeding   % Wound base Red or Granulating 100%   Peri-wound Assessment Erythema (blanchable);Hemosiderin   Drainage Amount Moderate   Drainage Description Sanguineous   Treatment Cleansed;Debridement (Selective)   Wound Properties Date First Assessed: 09/24/15 Time First Assessed: 1133 Wound Type: Other (Comment) , venous stasis ulcer   Location: Leg Location Orientation: Right;Distal Wound Description (Comments): anterior wounds (in horseshoe appearance)  Present on Admission: Yes   Dressing Type Compression wrap;Foam;Impregnated gauze (bismuth)   Dressing Changed Changed   Dressing Status Old drainage   Dressing Change Frequency PRN   Site / Wound Assessment Friable;Red   % Wound base Red or Granulating 100%   % Wound base Yellow/Fibrinous Exudate 0%   Drainage Amount Moderate   Drainage Description Sanguineous   Treatment Cleansed;Debridement (Selective)   Selective Debridement - Location Rt LE    Selective Debridement - Tools Used Forceps   Selective Debridement - Tissue Removed --  slough   Wound Therapy - Clinical Statement Dressings a little adherent but much improved with reduced size and hypergranulation.  cleansed well and debrided.  Continued use of alginate dressing and coban.  use of 1/2" foam for profore.   Factors Delaying/Impairing  Wound Healing Infection - systemic/local;Multiple medical problems;Vascular compromise   Hydrotherapy Plan Debridement;Dressing change;Patient/family education   Wound Therapy - Frequency --  continue 2x a week for 4 wks; if no improvement return MD if   Wound Plan Continue per current plan as wounds are improving, 1X week.    Dressing  calcium alginate (no silver), ABD pad, kerlix, foam and profore system                   PT Short Term Goals - 02/15/16 1900      PT SHORT TERM GOAL #1   Title Pt to verbalize the importance of using compression  every day to keep legs wound free   Time 1   Period Weeks   Status Achieved     PT SHORT TERM GOAL #2   Title Pt to state that the pain in her legs are no greater than 3/10 to allow pt to ambulate for five minutes without stopping    Time 2   Period Weeks   Status Achieved     PT SHORT TERM GOAL #3   Title Pt to have only 5 wounds on Rt LE and 2 on Lt LE    Time 2   Period Weeks   Status Partially Met           PT Long Term Goals - 02/15/16 1900      PT LONG TERM GOAL #1   Title Pt to be wound free to reduce risk of infection    Time 4   Period Weeks   Status On-going     PT LONG TERM GOAL #2   Title Pt to have obtained compression garment and to be able to don and doff with minimal assist from family    Time 4   Period Weeks   Status On-going             Patient will benefit from skilled therapeutic intervention in order to improve the following deficits and impairments:     Visit Diagnosis: Lymphedema of right lower extremity  Chronic ulcer of leg, limited to breakdown of skin, unspecified laterality (HCC)  Pain in right lower leg  Unsteadiness on feet     Problem List Patient Active Problem List   Diagnosis Date Noted  . Alzheimer's disease 10/11/2014  . Essential hypertension 10/11/2014  . Cardiomyopathy, dilated ([place]) 07/24/2014  . Apnea, sleep 07/24/2014  . Anemia 04/28/2014  . Chronic hypoxemic respiratory failure ([place]) 04/12/2014  . Chronic ulcer of leg ([place]) 04/12/2014  . Lymphedema of leg 04/12/2014  . Disorder of nutrition 04/12/2014  . Hypothyroidism 03/24/2014  . Stasis dermatitis of both legs 05/06/2013  . Pacemaker-St.Jude 11/25/2011  . Complete heart block ([place]) 12/12/2010  . Permanent atrial fibrillation ([place]) 12/12/2010  . Diastolic dysfunction 38/17/7116  . Venous insufficiency 12/12/2010  . Long term current use of anticoagulant 09/30/2002    Teena Irani, PTA/CLT [phone]  02/28/2016, 12:13 PM  [facility] [address] [place], [name], [address] Phone: [phone]   Fax:  [phone]  Name: CINTIA GLEED MRN: [id number] Date of Birth: 18-Jun-1933

## 2016-03-04 ENCOUNTER — Ambulatory Visit (INDEPENDENT_AMBULATORY_CARE_PROVIDER_SITE_OTHER): Payer: PPO | Admitting: *Deleted

## 2016-03-04 DIAGNOSIS — I442 Atrioventricular block, complete: Secondary | ICD-10-CM

## 2016-03-04 NOTE — Progress Notes (Signed)
Remote pacemaker transmission.   

## 2016-03-06 ENCOUNTER — Ambulatory Visit (HOSPITAL_COMMUNITY): Payer: PPO | Admitting: Physical Therapy

## 2016-03-06 DIAGNOSIS — M79661 Pain in right lower leg: Secondary | ICD-10-CM

## 2016-03-06 DIAGNOSIS — I890 Lymphedema, not elsewhere classified: Secondary | ICD-10-CM | POA: Diagnosis not present

## 2016-03-06 DIAGNOSIS — L97901 Non-pressure chronic ulcer of unspecified part of unspecified lower leg limited to breakdown of skin: Secondary | ICD-10-CM

## 2016-03-06 NOTE — Therapy (Signed)
[name], [name], [address] Phone: [phone]   Fax:  [phone]  Wound Care Therapy  Patient Details  Name: [name] MRN: [id number] Date of Birth: 05-04-1933 Referring Provider: Claretta Hall   Encounter Date: 03/06/2016      PT End of Session - 03/06/16 1250    Visit Number 31   Number of Visits 34   Date for PT Re-Evaluation 03/08/16   Authorization Type [employer] : g code and recert done at visit 45   Authorization - Visit Number 31   Authorization - Number of Visits 34   PT Start Time 1040   PT Stop Time 1120   PT Time Calculation (min) 40 min   Activity Tolerance Patient tolerated treatment well   Behavior During Therapy [facility] for tasks assessed/performed      Past Medical History:  Diagnosis Date  . Chronic lung disease    on home O2   . Complete heart block (HCC)    s/p PPM by Dr Rayann Heman  . Debilitated   . Diastolic dysfunction   . Hypertension   . Obesity   . Permanent atrial fibrillation ([place])   . Pulmonary hypertension   . Rheumatoid arthritis(714.0)   . Sleep apnea   . Stroke [facility])    remote  . Venous insufficiency    with chronic leg ulcers    Past Surgical History:  Procedure Laterality Date  . PACEMAKER INSERTION  08/13/10   SJM by Dr Rayann Heman    There were no vitals filed for this visit.                  Wound Therapy - 03/06/16 1246    Subjective no pain or issues to report   Patient and Family Stated Goals wounds to heal   Date of Onset 08/24/15   Pain Assessment No/denies pain   Wound Properties Date First Assessed: 01/03/16 Time First Assessed: 1234 Wound Type: Other (Comment) Location: Leg Location Orientation: Right Wound Description (Comments): 4 clustered wounds together on lateral LE Present on Admission: Yes   Dressing Type Compression wrap;Foam;Impregnated gauze (bismuth)   Dressing Changed Changed   Dressing Status Old drainage    Dressing Change Frequency PRN   Site / Wound Assessment Friable;Red;Pink   % Wound base Red or Granulating 100%   Peri-wound Assessment Erythema (blanchable);Hemosiderin   Drainage Amount Moderate   Drainage Description Sanguineous   Treatment Cleansed;Debridement (Selective)   Wound Properties Date First Assessed: 09/24/15 Time First Assessed: 1133 Wound Type: Other (Comment) , venous stasis ulcer   Location: Leg Location Orientation: Right;Distal Wound Description (Comments): anterior wounds (in horseshoe appearance)  Present on Admission: Yes   Dressing Type Compression wrap;Foam;Impregnated gauze (bismuth)   Dressing Changed Changed   Dressing Status Old drainage   Dressing Change Frequency PRN   Site / Wound Assessment Friable;Red   % Wound base Red or Granulating 100%   % Wound base Yellow/Fibrinous Exudate 0%   Drainage Amount Moderate   Drainage Description Sanguineous   Treatment Cleansed;Debridement (Selective)   Selective Debridement - Location Rt LE    Selective Debridement - Tools Used Forceps   Selective Debridement - Tissue Removed --  slough   Wound Therapy - Clinical Statement Calcium alginate is working well to decrease hypergranulation and prevent maceration.  Cleansed and debrided borders of wound to increase approximation.  Continued with calcium alginate, ABD and profore system using 1/2" foam.  pt reported  comfort at end of session.   Factors Delaying/Impairing Wound Healing Infection - systemic/local;Multiple medical problems;Vascular compromise   Hydrotherapy Plan Debridement;Dressing change;Patient/family education   Wound Therapy - Frequency --  continue 2x a week for 4 wks; if no improvement return MD if   Wound Plan Continue per current plan as wounds are improving, 1X week.    Dressing  calcium alginate (no silver), ABD pad, kerlix, foam and profore system                   PT Short Term Goals - 02/15/16 1900      PT SHORT TERM GOAL #1    Title Pt to verbalize the importance of using compression every day to keep legs wound free   Time 1   Period Weeks   Status Achieved     PT SHORT TERM GOAL #2   Title Pt to state that the pain in her legs are no greater than 3/10 to allow pt to ambulate for five minutes without stopping    Time 2   Period Weeks   Status Achieved     PT SHORT TERM GOAL #3   Title Pt to have only 5 wounds on Rt LE and 2 on Lt LE    Time 2   Period Weeks   Status Partially Met           PT Long Term Goals - 02/15/16 1900      PT LONG TERM GOAL #1   Title Pt to be wound free to reduce risk of infection    Time 4   Period Weeks   Status On-going     PT LONG TERM GOAL #2   Title Pt to have obtained compression garment and to be able to don and doff with minimal assist from family    Time 4   Period Weeks   Status On-going             Patient will benefit from skilled therapeutic intervention in order to improve the following deficits and impairments:     Visit Diagnosis: Lymphedema of right lower extremity  Chronic ulcer of leg, limited to breakdown of skin, unspecified laterality (HCC)  Pain in right lower leg     Problem List Patient Active Problem List   Diagnosis Date Noted  . Alzheimer's disease 10/11/2014  . Essential hypertension 10/11/2014  . Cardiomyopathy, dilated ([place]) 07/24/2014  . Apnea, sleep 07/24/2014  . Anemia 04/28/2014  . Chronic hypoxemic respiratory failure ([place]) 04/12/2014  . Chronic ulcer of leg ([place]) 04/12/2014  . Lymphedema of leg 04/12/2014  . Disorder of nutrition 04/12/2014  . Hypothyroidism 03/24/2014  . Stasis dermatitis of both legs 05/06/2013  . Pacemaker-St.Jude 11/25/2011  . Complete heart block ([place]) 12/12/2010  . Permanent atrial fibrillation ([place]) 12/12/2010  . Diastolic dysfunction 92/34/1443  . Venous insufficiency 12/12/2010  . Long term current use of anticoagulant 09/30/2002    Teena Irani,  PTA/CLT [phone]  03/06/2016, 12:53 PM  [facility] [address] [place], [name], [address] Phone: [phone]   Fax:  [phone]  Name: [name] MRN: [id number] Date of Birth: Jan 12, 1934

## 2016-03-07 ENCOUNTER — Encounter: Payer: Self-pay | Admitting: Cardiology

## 2016-03-07 LAB — CUP PACEART REMOTE DEVICE CHECK
Date Time Interrogation Session: 20180209161846
Implantable Lead Model: 1948
Implantable Pulse Generator Implant Date: 20120717
MDC IDC LEAD IMPLANT DT: 20120717
MDC IDC LEAD LOCATION: 753860
MDC IDC PG SERIAL: 7245638

## 2016-03-13 ENCOUNTER — Ambulatory Visit (HOSPITAL_COMMUNITY): Payer: PPO | Admitting: Physical Therapy

## 2016-03-13 DIAGNOSIS — I890 Lymphedema, not elsewhere classified: Secondary | ICD-10-CM | POA: Diagnosis not present

## 2016-03-13 DIAGNOSIS — L97901 Non-pressure chronic ulcer of unspecified part of unspecified lower leg limited to breakdown of skin: Secondary | ICD-10-CM

## 2016-03-13 DIAGNOSIS — I872 Venous insufficiency (chronic) (peripheral): Secondary | ICD-10-CM

## 2016-03-13 DIAGNOSIS — M79661 Pain in right lower leg: Secondary | ICD-10-CM

## 2016-03-13 DIAGNOSIS — R2681 Unsteadiness on feet: Secondary | ICD-10-CM

## 2016-03-13 NOTE — Therapy (Signed)
[facility], [name], [address] Phone: [phone]   Fax:  [phone]  Wound Care Therapy  Patient Details  Name: [name] MRN: [id number] Date of Birth: 1933/04/22 Referring Provider: [name]   Encounter Date: 03/13/2016      PT End of Session - 03/13/16 1124    Visit Number 32   Number of Visits 34   Date for PT Re-Evaluation 03/08/16   Authorization Type [employer] : g code and recert done at visit 30   Authorization - Visit Number 44   Authorization - Number of Visits 34   PT Start Time 1030   PT Stop Time 1110   PT Time Calculation (min) 40 min   Activity Tolerance Patient tolerated treatment well   Behavior During Therapy [facility] for tasks assessed/performed      Past Medical History:  Diagnosis Date  . Chronic lung disease    on home O2   . Complete heart block (HCC)    s/p PPM by Dr Rayann Heman  . Debilitated   . Diastolic dysfunction   . Hypertension   . Obesity   . Permanent atrial fibrillation ([place])   . Pulmonary hypertension   . Rheumatoid arthritis(714.0)   . Sleep apnea   . Stroke [facility])    remote  . Venous insufficiency    with chronic leg ulcers    Past Surgical History:  Procedure Laterality Date  . PACEMAKER INSERTION  08/13/10   SJM by Dr Rayann Heman    There were no vitals filed for this visit.                  Wound Therapy - 03/13/16 1119    Subjective [name] states she has no pain, her leg was itching a little but no pain    Patient and Family Stated Goals wounds to heal   Date of Onset 08/24/15   Pain Assessment No/denies pain   Wound Properties Date First Assessed: 01/03/16 Time First Assessed: 1234 Wound Type: Other (Comment) Location: Leg Location Orientation: Right Wound Description (Comments): 4 clustered wounds together on lateral LE Present on Admission: Yes   Dressing Type Compression wrap;Foam;Impregnated gauze (bismuth)    Dressing Changed Changed   Dressing Status Old drainage   Dressing Change Frequency PRN   Site / Wound Assessment Friable;Red   % Wound base Red or Granulating 100%   Peri-wound Assessment Erythema (blanchable);Hemosiderin   Drainage Amount Minimal  min to moderate    Drainage Description Sanguineous   Treatment Cleansed;Debridement (Selective);Other (Comment)   Wound Properties Date First Assessed: 09/24/15 Time First Assessed: 1133 Wound Type: Other (Comment) , venous stasis ulcer   Location: Leg Location Orientation: Right;Distal Wound Description (Comments): anterior wounds (in horseshoe appearance)  Present on Admission: Yes   Dressing Type Compression wrap;Foam;Impregnated gauze (bismuth)   Dressing Changed Changed   Dressing Status Old drainage   Dressing Change Frequency PRN   Site / Wound Assessment Friable;Red   % Wound base Red or Granulating 100%   % Wound base Yellow/Fibrinous Exudate 0%   Drainage Amount Moderate   Drainage Description Sanguineous   Treatment Cleansed;Debridement (Selective)   Selective Debridement - Location Rt LE    Selective Debridement - Tools Used Forceps   Selective Debridement - Tissue Removed --  slough   Wound Therapy - Clinical Statement Calcium alginate with foam continues to work well to decrease hypergranulation. It is noted that as soon as the  dressing is reomoved hypergranulation begins. Foam placed between layer of cotton and second layer of profore.  Wounds Cleansed and debrided borders of wound to increase approximation.  Continued with calcium alginate, ABD and profore system using 1/2" foam.  pt reported comfort at end of session.   Factors Delaying/Impairing Wound Healing Infection - systemic/local;Multiple medical problems;Vascular compromise   Hydrotherapy Plan Debridement;Dressing change;Patient/family education   Wound Therapy - Frequency --  continue 2x a week for 4 wks; if no improvement return MD if   Wound Plan Continue per  current plan as wounds are improving, 1X week.    Dressing  calcium alginate (no silver), ABD pad, kerlix, foam and profore system                   PT Short Term Goals - 02/15/16 1900      PT SHORT TERM GOAL #1   Title Pt to verbalize the importance of using compression every day to keep legs wound free   Time 1   Period Weeks   Status Achieved     PT SHORT TERM GOAL #2   Title Pt to state that the pain in her legs are no greater than 3/10 to allow pt to ambulate for five minutes without stopping    Time 2   Period Weeks   Status Achieved     PT SHORT TERM GOAL #3   Title Pt to have only 5 wounds on Rt LE and 2 on Lt LE    Time 2   Period Weeks   Status Partially Met           PT Long Term Goals - 02/15/16 1900      PT LONG TERM GOAL #1   Title Pt to be wound free to reduce risk of infection    Time 4   Period Weeks   Status On-going     PT LONG TERM GOAL #2   Title Pt to have obtained compression garment and to be able to don and doff with minimal assist from family    Time 4   Period Weeks   Status On-going               Plan - 03/13/16 1125    Rehab Potential Good   PT Frequency 2x / week   PT Duration 12 weeks  followed by one time a week for four weeks    PT Treatment/Interventions ADLs/Self Care Home Management;Gait training;Stair training;Therapeutic exercise;Patient/family education;Other (comment)  debridement and multilayer compression dressing    PT Next Visit Plan Pt daughter is to question her sister if she wound feel comfortable dressing her mothers wound secondary to pt needing minimal debridement at this time.      Patient will benefit from skilled therapeutic intervention in order to improve the following deficits and impairments:  Pain, Increased edema, Difficulty walking, Decreased balance, Other (comment) (nonhealing wound )  Visit Diagnosis: Lymphedema of right lower extremity  Stasis dermatitis of both  legs  Chronic ulcer of leg, limited to breakdown of skin, unspecified laterality (HCC)  Pain in right lower leg  Unsteadiness on feet     Problem List Patient Active Problem List   Diagnosis Date Noted  . Alzheimer's disease 10/11/2014  . Essential hypertension 10/11/2014  . Cardiomyopathy, dilated ([place]) 07/24/2014  . Apnea, sleep 07/24/2014  . Anemia 04/28/2014  . Chronic hypoxemic respiratory failure ([place]) 04/12/2014  . Chronic ulcer of leg ([place]) 04/12/2014  . Lymphedema of  leg 04/12/2014  . Disorder of nutrition 04/12/2014  . Hypothyroidism 03/24/2014  . Stasis dermatitis of both legs 05/06/2013  . Pacemaker-St.Jude 11/25/2011  . Complete heart block ([place]) 12/12/2010  . Permanent atrial fibrillation ([place]) 12/12/2010  . Diastolic dysfunction 73/66/8159  . Venous insufficiency 12/12/2010  . Long term current use of anticoagulant 09/30/2002    Rayetta Humphrey, PT CLT [phone] 03/13/2016, 11:27 AM  [facility] [address] [place], [name], [address] Phone: [phone]   Fax:  [phone]  Name: [name] MRN: [id number] Date of Birth: [birth year]

## 2016-03-20 ENCOUNTER — Ambulatory Visit (HOSPITAL_COMMUNITY): Payer: PPO | Admitting: Physical Therapy

## 2016-03-20 ENCOUNTER — Other Ambulatory Visit: Payer: Self-pay | Admitting: Family Medicine

## 2016-03-20 DIAGNOSIS — I872 Venous insufficiency (chronic) (peripheral): Secondary | ICD-10-CM

## 2016-03-20 DIAGNOSIS — M79661 Pain in right lower leg: Secondary | ICD-10-CM

## 2016-03-20 DIAGNOSIS — L97901 Non-pressure chronic ulcer of unspecified part of unspecified lower leg limited to breakdown of skin: Secondary | ICD-10-CM

## 2016-03-20 DIAGNOSIS — I890 Lymphedema, not elsewhere classified: Secondary | ICD-10-CM | POA: Diagnosis not present

## 2016-03-20 DIAGNOSIS — R2681 Unsteadiness on feet: Secondary | ICD-10-CM

## 2016-03-20 NOTE — Therapy (Addendum)
[place], [name], [address] Phone: [phone]   Fax:  [phone]  Wound Care Therapy  Patient Details  Name: [name] MRN: [id number] Date of Birth: 12/08/1933 Referring Provider: [name]   Encounter Date: 03/20/2016      PT End of Session - 03/20/16 1318    Visit Number 33   Number of Visits 41   Date for PT Re-Evaluation 03/08/16   Authorization Type [employer] : g code and recert done at visit 41   Authorization - Visit Number 39   Authorization - Number of Visits 41   PT Start Time 1033   PT Stop Time 1115   PT Time Calculation (min) 42 min   Activity Tolerance Patient tolerated treatment well   Behavior During Therapy [facility] for tasks assessed/performed      Past Medical History:  Diagnosis Date  . Chronic lung disease    on home O2   . Complete heart block (HCC)    s/p PPM by Dr Rayann Heman  . Debilitated   . Diastolic dysfunction   . Hypertension   . Obesity   . Permanent atrial fibrillation ([place])   . Pulmonary hypertension   . Rheumatoid arthritis(714.0)   . Sleep apnea   . Stroke [facility])    remote  . Venous insufficiency    with chronic leg ulcers    Past Surgical History:  Procedure Laterality Date  . PACEMAKER INSERTION  08/13/10   SJM by Dr Rayann Heman    There were no vitals filed for this visit.                  Wound Therapy - 03/20/16 1312    Subjective no pain, just a little itchy   Patient and Family Stated Goals wounds to heal   Date of Onset 08/24/15   Pain Assessment No/denies pain   Wound Properties Date First Assessed: 01/03/16 Time First Assessed: 1234 Wound Type: Other (Comment) Location: Leg Location Orientation: Right Wound Description (Comments): 4 clustered wounds together on lateral LE Present on Admission: Yes   Dressing Type Compression wrap;Foam   Dressing Changed Changed   Dressing Status Old drainage   Dressing Change  Frequency PRN   Site / Wound Assessment Friable;Red   % Wound base Red or Granulating 100%   Peri-wound Assessment Erythema (blanchable);Hemosiderin   Drainage Amount Moderate  min to moderate    Drainage Description Sanguineous   Treatment Cleansed;Debridement (Selective)   Wound Properties Date First Assessed: 09/24/15 Time First Assessed: 1133 Wound Type: Other (Comment) , venous stasis ulcer   Location: Leg Location Orientation: Right;Distal Wound Description (Comments): anterior wounds (in horseshoe appearance)  Present on Admission: Yes   Dressing Type Compression wrap;Foam   Dressing Changed Changed   Dressing Status Old drainage   Dressing Change Frequency PRN   Site / Wound Assessment Friable;Red   % Wound base Red or Granulating 100%   % Wound base Yellow/Fibrinous Exudate 0%   Drainage Amount Moderate   Drainage Description Sanguineous   Treatment Cleansed;Debridement (Selective)   Selective Debridement - Location Rt LE    Selective Debridement - Tools Used Forceps   Selective Debridement - Tissue Removed slough and dry skin  slough   Wound Therapy - Clinical Statement Drainage soaked through foam and bandages this session.  Wound appears larger with more maceration, however continues to be 100% hypergranulated.  New foam cut and continued with compression using profore/foam.  Calcium alginate cut to fit wounds and vaseline applied to wound borders to prevent maceration.  suggested returning to 2X week dressing changes at this point as wound appears to have increased drainage and wound bed is too moist.     Factors Delaying/Impairing Wound Healing Infection - systemic/local;Multiple medical problems;Vascular compromise   Hydrotherapy Plan Debridement;Dressing change;Patient/family education   Wound Therapy - Frequency --  continue 2x a week for 4 wks; if no improvement return MD if   Wound Plan Continue per current plan with increase to 2X week.  Re-evaluate next session.    Dressing  calcium alginate (no silver), ABD pad, kerlix, foam and profore system      [id number] CK G8991 CJ             PT Short Term Goals - 02/15/16 1900      PT SHORT TERM GOAL #1   Title Pt to verbalize the importance of using compression every day to keep legs wound free   Time 1   Period Weeks   Status Achieved     PT SHORT TERM GOAL #2   Title Pt to state that the pain in her legs are no greater than 3/10 to allow pt to ambulate for five minutes without stopping    Time 2   Period Weeks   Status Achieved     PT SHORT TERM GOAL #3   Title Pt to have only 5 wounds on Rt LE and 2 on Lt LE    Time 2   Period Weeks   Status Partially Met           PT Long Term Goals - 02/15/16 1900      PT LONG TERM GOAL #1   Title Pt to be wound free to reduce risk of infection    Time 4   Period Weeks   Status On-going     PT LONG TERM GOAL #2   Title Pt to have obtained compression garment and to be able to don and doff with minimal assist from family    Time 4   Period Weeks   Status On-going             Patient will benefit from skilled therapeutic intervention in order to improve the following deficits and impairments:     Visit Diagnosis: Lymphedema of right lower extremity  Chronic ulcer of leg, limited to breakdown of skin, unspecified laterality (HCC)  Pain in right lower leg  Unsteadiness on feet  Stasis dermatitis of both legs     Problem List Patient Active Problem List   Diagnosis Date Noted  . Alzheimer's disease 10/11/2014  . Essential hypertension 10/11/2014  . Cardiomyopathy, dilated ([place]) 07/24/2014  . Apnea, sleep 07/24/2014  . Anemia 04/28/2014  . Chronic hypoxemic respiratory failure ([place]) 04/12/2014  . Chronic ulcer of leg ([place]) 04/12/2014  . Lymphedema of leg 04/12/2014  . Disorder of nutrition 04/12/2014  . Hypothyroidism 03/24/2014  . Stasis dermatitis of both legs 05/06/2013  . Pacemaker-St.Jude 11/25/2011  .  Complete heart block ([place]) 12/12/2010  . Permanent atrial fibrillation ([place]) 12/12/2010  . Diastolic dysfunction 01/77/9390  . Venous insufficiency 12/12/2010  . Long term current use of anticoagulant 09/30/2002    Teena Irani, PTA/CLT [phone]  03/20/2016, 1:23 PM  Rayetta Humphrey, PT CLT [place] [address] [place], [name], [address] Phone: [phone]   Fax:  [phone]  Name: [name] MRN: [id number]  Date of Birth: 1933/09/20

## 2016-03-21 ENCOUNTER — Encounter: Payer: Self-pay | Admitting: Cardiology

## 2016-03-25 ENCOUNTER — Other Ambulatory Visit: Payer: Self-pay | Admitting: *Deleted

## 2016-03-25 ENCOUNTER — Other Ambulatory Visit: Payer: Self-pay | Admitting: Family Medicine

## 2016-03-25 ENCOUNTER — Ambulatory Visit (HOSPITAL_COMMUNITY): Payer: PPO | Admitting: Physical Therapy

## 2016-03-25 ENCOUNTER — Telehealth: Payer: Self-pay | Admitting: Family Medicine

## 2016-03-25 ENCOUNTER — Ambulatory Visit: Payer: PPO | Admitting: Family Medicine

## 2016-03-25 ENCOUNTER — Telehealth (HOSPITAL_COMMUNITY): Payer: Self-pay | Admitting: Physical Therapy

## 2016-03-25 DIAGNOSIS — L03115 Cellulitis of right lower limb: Secondary | ICD-10-CM

## 2016-03-25 DIAGNOSIS — M79661 Pain in right lower leg: Secondary | ICD-10-CM

## 2016-03-25 DIAGNOSIS — I890 Lymphedema, not elsewhere classified: Secondary | ICD-10-CM | POA: Diagnosis not present

## 2016-03-25 DIAGNOSIS — I872 Venous insufficiency (chronic) (peripheral): Secondary | ICD-10-CM

## 2016-03-25 DIAGNOSIS — L97901 Non-pressure chronic ulcer of unspecified part of unspecified lower leg limited to breakdown of skin: Secondary | ICD-10-CM

## 2016-03-25 DIAGNOSIS — L97819 Non-pressure chronic ulcer of other part of right lower leg with unspecified severity: Secondary | ICD-10-CM

## 2016-03-25 DIAGNOSIS — R2681 Unsteadiness on feet: Secondary | ICD-10-CM

## 2016-03-25 DIAGNOSIS — I83018 Varicose veins of right lower extremity with ulcer other part of lower leg: Secondary | ICD-10-CM

## 2016-03-25 MED ORDER — AMOXICILLIN-POT CLAVULANATE 875-125 MG PO TABS: 1.0000 | ORAL_TABLET | Freq: Two times a day (BID) | ORAL | 0 refills | Status: DC

## 2016-03-25 NOTE — Telephone Encounter (Signed)
Pt's daughter called with concerns regarding pt's leg wound Per daughter wound is draining and has foul smelling odor Instructed daughter to contact wound care where pt is being treated She verbalizes understanding and will contact wound center

## 2016-03-25 NOTE — Telephone Encounter (Signed)
Pt is seeing wound care today for worsening leg wound Per daughter, wound care center wants pt to have rx for antibiotic Please review and advise

## 2016-03-25 NOTE — Therapy (Signed)
[place], [name], [address] Phone: [phone]   Fax:  [phone]  Wound Care Therapy  Patient Details  Name: [name] MRN: [id number] Date of Birth: 04-10-1933 Referring Provider: [name]   Encounter Date: 03/25/2016      PT End of Session - 03/25/16 2127    Visit Number 34   Number of Visits 41   Date for PT Re-Evaluation 03/08/16   Authorization Type [employer] : g code and recert done at visit 27   Authorization - Visit Number 34   Authorization - Number of Visits 41   PT Start Time 6734   PT Stop Time 1700   PT Time Calculation (min) 45 min   Activity Tolerance Patient tolerated treatment well   Behavior During Therapy [facility] for tasks assessed/performed      Past Medical History:  Diagnosis Date  . Chronic lung disease    on home O2   . Complete heart block (HCC)    s/p PPM by Dr Rayann Heman  . Debilitated   . Diastolic dysfunction   . Hypertension   . Obesity   . Permanent atrial fibrillation ([place])   . Pulmonary hypertension   . Rheumatoid arthritis(714.0)   . Sleep apnea   . Stroke [facility])    remote  . Venous insufficiency    with chronic leg ulcers    Past Surgical History:  Procedure Laterality Date  . PACEMAKER INSERTION  08/13/10   SJM by Dr Rayann Heman    There were no vitals filed for this visit.                  Wound Therapy - 03/25/16 2115    Subjective Daughter made appointment today due to noting increased smell and drainage from wound.  Pt states she is also having pain and tenderness in her leg.  Daughter contacted MD office and was told to come here instead of there.   Patient and Family Stated Goals wounds to heal   Date of Onset 08/24/15   Pain Assessment Faces   Faces Pain Scale Hurts even more   Pain Type Acute pain   Pain Location Leg   Pain Orientation Right   Pain Descriptors / Indicators Burning;Discomfort;Grimacing;Restless    Pain Onset Gradual   Patients Stated Pain Goal 0   Pain Intervention(s) Other (Comment)   Wound Properties Date First Assessed: 01/03/16 Time First Assessed: 1234 Wound Type: Other (Comment) Location: Leg Location Orientation: Right Wound Description (Comments): 4 clustered wounds together on lateral LE Present on Admission: Yes   Dressing Type Compression wrap;Foam   Dressing Changed Changed   Dressing Status Old drainage   Dressing Change Frequency PRN   Site / Wound Assessment Friable;Red;Painful   % Wound base Red or Granulating 85%   % Wound base Yellow/Fibrinous Exudate 15%   Peri-wound Assessment Erythema (blanchable);Hemosiderin   Wound Length (cm) --  see assessment   Wound Width (cm) --  see assessment   Drainage Amount Copious  min to moderate    Drainage Description Serosanguineous;Purulent   Treatment Cleansed;Debridement (Selective)   Wound Properties Date First Assessed: 09/24/15 Time First Assessed: 1133 Wound Type: Other (Comment) , venous stasis ulcer   Location: Leg Location Orientation: Right;Distal Wound Description (Comments): anterior wounds (in horseshoe appearance)  Present on Admission: Yes   Dressing Type Compression wrap;Foam   Dressing Changed Changed   Dressing Status Old drainage   Dressing Change Frequency PRN  Site / Wound Assessment Friable;Red;Painful   % Wound base Red or Granulating 90%   % Wound base Yellow/Fibrinous Exudate 10%   Peri-wound Assessment Erythema (blanchable);Pink   Wound Length (cm) --  see assessment   Wound Width (cm) --  see assessment   Drainage Amount Copious   Drainage Description Purulent;Serosanguineous  green   Treatment Cleansed;Debridement (Selective)   Selective Debridement - Location Rt LE    Selective Debridement - Tools Used Forceps   Selective Debridement - Tissue Removed slough and dry skin  slough   Wound Therapy - Clinical Statement Noted redness and irritation indicating active infection.  Wounds are  now larger and all one large wound encompassing anterior Rt LE and wrapping around posteriolateral aspect.  Measurements of 15cmX 12cm without depth.  Daughter called MD office while in wound room requesting culture.  Nurse indicated she would either fax or send order through epic.  Wound cultured, however no order was recieved by leaving time of 5:30.  Attempted to contact office to request again, however unable to get an answer.  Pt with increased tenderness and redness going up medial, posterior LE.  No compression applied this session due to irritation and active infection.  yellowish, green drainage on bandages so changed back to silver hydrofiber and extra ABD pad to absorb drainage.  Pt reported overall comfort.  Daughter to f/u with MD on antibiotics.   Factors Delaying/Impairing Wound Healing Infection - systemic/local;Multiple medical problems;Vascular compromise   Hydrotherapy Plan Debridement;Dressing change;Patient/family education   Wound Therapy - Frequency --  continue 2x a week for 4 wks; if no improvement return MD if   Wound Plan Continue per current plan with increase to 2X week.  Follow up for need to complete wound culture again next session.  Follow up on antibiotic.  Begin compression again when infection decreases.  Possibly change dressing to medihoney alginate if needed.     Dressing  silver hydrofiber, ABD pad, kerlix, netting #5                   PT Short Term Goals - 02/15/16 1900      PT SHORT TERM GOAL #1   Title Pt to verbalize the importance of using compression every day to keep legs wound free   Time 1   Period Weeks   Status Achieved     PT SHORT TERM GOAL #2   Title Pt to state that the pain in her legs are no greater than 3/10 to allow pt to ambulate for five minutes without stopping    Time 2   Period Weeks   Status Achieved     PT SHORT TERM GOAL #3   Title Pt to have only 5 wounds on Rt LE and 2 on Lt LE    Time 2   Period Weeks    Status Partially Met           PT Long Term Goals - 02/15/16 1900      PT LONG TERM GOAL #1   Title Pt to be wound free to reduce risk of infection    Time 4   Period Weeks   Status On-going     PT LONG TERM GOAL #2   Title Pt to have obtained compression garment and to be able to don and doff with minimal assist from family    Time 4   Period Weeks   Status On-going  Patient will benefit from skilled therapeutic intervention in order to improve the following deficits and impairments:     Visit Diagnosis: Lymphedema of right lower extremity  Chronic ulcer of leg, limited to breakdown of skin, unspecified laterality (HCC)  Pain in right lower leg  Unsteadiness on feet  Stasis dermatitis of both legs  Venous insufficiency  Varicose veins of right lower extremity with ulcer other part of lower leg [facility])     Problem List Patient Active Problem List   Diagnosis Date Noted  . Alzheimer's disease 10/11/2014  . Essential hypertension 10/11/2014  . Cardiomyopathy, dilated ([place]) 07/24/2014  . Apnea, sleep 07/24/2014  . Anemia 04/28/2014  . Chronic hypoxemic respiratory failure ([place]) 04/12/2014  . Chronic ulcer of leg ([place]) 04/12/2014  . Lymphedema of leg 04/12/2014  . Disorder of nutrition 04/12/2014  . Hypothyroidism 03/24/2014  . Stasis dermatitis of both legs 05/06/2013  . Pacemaker-St.Jude 11/25/2011  . Complete heart block ([place]) 12/12/2010  . Permanent atrial fibrillation ([place]) 12/12/2010  . Diastolic dysfunction 46/50/3546  . Venous insufficiency 12/12/2010  . Long term current use of anticoagulant 09/30/2002    Teena Irani, PTA/CLT [phone]  03/25/2016, 9:29 PM  [place] [address] [place], [name], [address] Phone: [phone]   Fax:  [phone]  Name: [name] MRN: [id number] Date of Birth: [birth year]

## 2016-03-25 NOTE — Telephone Encounter (Signed)
I sent in the requested prescription 

## 2016-03-25 NOTE — Telephone Encounter (Signed)
Pt's daughter notified of RX 

## 2016-03-25 NOTE — Telephone Encounter (Signed)
Patient's daughter called [facility] to receive order for wound culture for Rt LE.  Nurse reported she would fax or send order over in epic.  No order was ever received and therapist had to leave for the day.  Attempted to call again at 17:25 but no answer was received at MD office. Teena Irani, PTA/CLT [phone]

## 2016-03-25 NOTE — Progress Notes (Signed)
Pt seeing [facility] wound center  today WC called requesting order for wound culture Order entered in [place] per Dr Livia Snellen

## 2016-03-26 ENCOUNTER — Encounter: Payer: Self-pay | Admitting: Family Medicine

## 2016-03-26 ENCOUNTER — Ambulatory Visit (INDEPENDENT_AMBULATORY_CARE_PROVIDER_SITE_OTHER): Payer: PPO | Admitting: Family Medicine

## 2016-03-26 VITALS — BP 128/73 | HR 78 | Temp 97.9°F | Ht 59.0 in | Wt 143.0 lb

## 2016-03-26 DIAGNOSIS — E039 Hypothyroidism, unspecified: Secondary | ICD-10-CM | POA: Diagnosis not present

## 2016-03-26 DIAGNOSIS — L97912 Non-pressure chronic ulcer of unspecified part of right lower leg with fat layer exposed: Secondary | ICD-10-CM | POA: Diagnosis not present

## 2016-03-26 DIAGNOSIS — I872 Venous insufficiency (chronic) (peripheral): Secondary | ICD-10-CM | POA: Diagnosis not present

## 2016-03-26 DIAGNOSIS — I10 Essential (primary) hypertension: Secondary | ICD-10-CM

## 2016-03-26 DIAGNOSIS — J449 Chronic obstructive pulmonary disease, unspecified: Secondary | ICD-10-CM | POA: Diagnosis not present

## 2016-03-26 DIAGNOSIS — Z7901 Long term (current) use of anticoagulants: Secondary | ICD-10-CM

## 2016-03-26 NOTE — Progress Notes (Signed)
Subjective:  Patient ID: [name], female    DOB: [birth year]  Age: 83 y.o. MRN: [id number]  CC: Hypothyroidism (pt here today for routine follow up on her thyroid and also has questions about antibiotic)   HPI [name] presents for Patient presents for follow-up on  thyroid. The patient has a history of hypothyroidism for many years. It has been stable recently. Pt. denies any change in  voice, loss of hair, heat or cold intolerance. Energy level has been adequate to good. Patient denies constipation and diarrhea. No myxedema. Medication is as noted below. Verified that pt is taking it daily on an empty stomach. Well tolerated.  Daughter states that the wound center once this to prescribe an antibiotic for her leg. The right leg seems to be getting more infected. She is due to have dressing change tomorrow. Also one day next week   History [name] has a past medical history of Chronic lung disease; Complete heart block ([place]); Debilitated; Diastolic dysfunction; Hypertension; Obesity; Permanent atrial fibrillation ([place]); Pulmonary hypertension; Rheumatoid arthritis(714.0); Sleep apnea; Stroke [facility]); and Venous insufficiency.   She has a past surgical history that includes Pacemaker insertion (08/13/10).   Her family history is not on file.She reports that she has never smoked. She has never used smokeless tobacco. She reports that she does not drink alcohol or use drugs.    ROS Review of Systems  Unable to perform ROS: Dementia    Objective:  BP 128/73   Pulse 78   Temp 97.9 F (36.6 C) (Oral)   Ht 4' 11"  (1.499 m)   Wt 143 lb (64.9 kg)   BMI 28.88 kg/m   BP Readings from Last 3 Encounters:  04/01/16 125/62  03/26/16 128/73  02/25/16 118/63    Wt Readings from Last 3 Encounters:  04/01/16 146 lb (66.2 kg)  03/26/16 143 lb (64.9 kg)  02/25/16 139 lb (63 kg)     Physical Exam  Constitutional: She is oriented to person, place, and time. She appears  well-developed and well-nourished. No distress.  HENT:  Head: Normocephalic and atraumatic.  Eyes: Conjunctivae are normal. Pupils are equal, round, and reactive to light.  Neck: Normal range of motion. Neck supple. No thyromegaly present.  Cardiovascular: Normal rate, regular rhythm and normal heart sounds.   No murmur heard. Pulmonary/Chest: Effort normal and breath sounds normal. No respiratory distress. She has no wheezes. She has no rales.  Abdominal: Soft. Bowel sounds are normal. She exhibits no distension. There is no tenderness.  Musculoskeletal: Normal range of motion.  Lymphadenopathy:    She has no cervical adenopathy.  Neurological: She is alert and oriented to person, place, and time.  Skin: Skin is warm and dry.  Psychiatric: She has a normal mood and affect. Her behavior is normal. Judgment and thought content normal.      Assessment & Plan:   [name] was seen today for hypothyroidism.  Diagnoses and all orders for this visit:  Venous insufficiency  Chronic ulcer of right lower extremity with fat layer exposed ([place]) -     Anaerobic and Aerobic Culture; Future  Long term current use of anticoagulant  Essential hypertension  Hypothyroidism, unspecified type -     CBC with Differential/Platelet -     CMP14+EGFR -     TSH + free T4       I am having Ms. Sulewski maintain her OXYGEN-HELIUM IN, ferrous sulfate, multivitamin with minerals, traMADol, potassium chloride, levothyroxine, hydrocortisone, apixaban, furosemide, simvastatin,  memantine, and donepezil.  Allergies as of 03/26/2016   No Known Allergies     Medication List       Accurate as of 03/26/16 11:59 PM. Always use your most recent med list.          amoxicillin-clavulanate 875-125 MG tablet Commonly known as:  AUGMENTIN Take 1 tablet by mouth 2 (two) times daily. Take all of this medication   apixaban 5 MG Tabs tablet Commonly known as:  ELIQUIS Take 1 tablet (5 mg total) by mouth 2  (two) times daily.   donepezil 10 MG tablet Commonly known as:  ARICEPT TAKE ONE (1) TABLET AT BEDTIME   doxycycline 100 MG capsule Commonly known as:  VIBRAMYCIN   ferrous sulfate 325 (65 FE) MG EC tablet Take 1 tablet (325 mg total) by mouth 3 (three) times daily with meals.   furosemide 40 MG tablet Commonly known as:  LASIX TAKE 1 TABLET EVERY MORNING AND TAKE 1/2 TABLET AT LUNCH   hydrocortisone 2.5 % rectal cream Commonly known as:  PROCTOZONE-HC Place 1 application rectally 2 (two) times daily.   levothyroxine 50 MCG tablet Commonly known as:  SYNTHROID, LEVOTHROID TAKE ONE (1) TABLET EACH DAY   memantine 10 MG tablet Commonly known as:  NAMENDA Take 1 tablet (10 mg total) by mouth 2 (two) times daily.   multivitamin with minerals tablet Take 1 tablet by mouth daily.   OXYGEN Inhale into the lungs continuous. 4L   potassium chloride 10 MEQ tablet Commonly known as:  K-DUR,KLOR-CON Take 1 tablet (10 mEq total) by mouth daily.   simvastatin 20 MG tablet Commonly known as:  ZOCOR TAKE ONE (1) TABLET EACH DAY   traMADol 50 MG tablet Commonly known as:  ULTRAM Take 1 tablet (50 mg total) by mouth 3 (three) times daily as needed for moderate pain.        Follow-up: Return in about 6 days (around 04/01/2016).  [name], M.D.

## 2016-03-27 ENCOUNTER — Other Ambulatory Visit (HOSPITAL_COMMUNITY)
Admission: AD | Admit: 2016-03-27 | Discharge: 2016-03-27 | Disposition: A | Discharge status: Home / Self Care | Payer: PPO | Source: Skilled Nursing Facility | Attending: Family Medicine | Admitting: Family Medicine

## 2016-03-27 ENCOUNTER — Ambulatory Visit (HOSPITAL_COMMUNITY): Payer: PPO | Attending: Family Medicine | Admitting: Physical Therapy

## 2016-03-27 ENCOUNTER — Other Ambulatory Visit (HOSPITAL_COMMUNITY): Payer: Self-pay | Admitting: Physical Therapy

## 2016-03-27 DIAGNOSIS — L97829 Non-pressure chronic ulcer of other part of left lower leg with unspecified severity: Secondary | ICD-10-CM

## 2016-03-27 DIAGNOSIS — L97912 Non-pressure chronic ulcer of unspecified part of right lower leg with fat layer exposed: Secondary | ICD-10-CM | POA: Diagnosis not present

## 2016-03-27 DIAGNOSIS — I872 Venous insufficiency (chronic) (peripheral): Secondary | ICD-10-CM | POA: Diagnosis not present

## 2016-03-27 DIAGNOSIS — R2681 Unsteadiness on feet: Secondary | ICD-10-CM | POA: Insufficient documentation

## 2016-03-27 DIAGNOSIS — I890 Lymphedema, not elsewhere classified: Secondary | ICD-10-CM

## 2016-03-27 DIAGNOSIS — I83028 Varicose veins of left lower extremity with ulcer other part of lower leg: Secondary | ICD-10-CM | POA: Insufficient documentation

## 2016-03-27 DIAGNOSIS — I83018 Varicose veins of right lower extremity with ulcer other part of lower leg: Secondary | ICD-10-CM | POA: Insufficient documentation

## 2016-03-27 DIAGNOSIS — M79661 Pain in right lower leg: Secondary | ICD-10-CM | POA: Insufficient documentation

## 2016-03-27 DIAGNOSIS — L97819 Non-pressure chronic ulcer of other part of right lower leg with unspecified severity: Secondary | ICD-10-CM

## 2016-03-27 DIAGNOSIS — M79662 Pain in left lower leg: Secondary | ICD-10-CM | POA: Insufficient documentation

## 2016-03-27 DIAGNOSIS — L97901 Non-pressure chronic ulcer of unspecified part of unspecified lower leg limited to breakdown of skin: Secondary | ICD-10-CM | POA: Diagnosis not present

## 2016-03-27 LAB — CBC WITH DIFFERENTIAL/PLATELET
BASOS ABS: 0 10*3/uL (ref 0.0–0.2)
Basos: 0 %
EOS (ABSOLUTE): 0.1 10*3/uL (ref 0.0–0.4)
EOS: 1 %
HEMATOCRIT: 31.8 % — AB (ref 34.0–46.6)
HEMOGLOBIN: 10.6 g/dL — AB (ref 11.1–15.9)
Immature Grans (Abs): 0 10*3/uL (ref 0.0–0.1)
Immature Granulocytes: 0 %
LYMPHS ABS: 1.8 10*3/uL (ref 0.7–3.1)
Lymphs: 16 %
MCH: 29.2 pg (ref 26.6–33.0)
MCHC: 33.3 g/dL (ref 31.5–35.7)
MCV: 88 fL (ref 79–97)
Monocytes Absolute: 1.2 10*3/uL — ABNORMAL HIGH (ref 0.1–0.9)
Monocytes: 10 %
Neutrophils Absolute: 8.5 10*3/uL — ABNORMAL HIGH (ref 1.4–7.0)
Neutrophils: 73 %
Platelets: 315 10*3/uL (ref 150–379)
RBC: 3.63 x10E6/uL — AB (ref 3.77–5.28)
RDW: 14.4 % (ref 12.3–15.4)
WBC: 11.6 10*3/uL — AB (ref 3.4–10.8)

## 2016-03-27 LAB — CMP14+EGFR
A/G RATIO: 1.5 (ref 1.2–2.2)
ALT: 8 IU/L (ref 0–32)
AST: 15 IU/L (ref 0–40)
Albumin: 4 g/dL (ref 3.5–4.7)
Alkaline Phosphatase: 77 IU/L (ref 39–117)
BILIRUBIN TOTAL: 0.3 mg/dL (ref 0.0–1.2)
BUN/Creatinine Ratio: 17 (ref 12–28)
BUN: 20 mg/dL (ref 8–27)
CHLORIDE: 94 mmol/L — AB (ref 96–106)
CO2: 25 mmol/L (ref 18–29)
Calcium: 8.9 mg/dL (ref 8.7–10.3)
Creatinine, Ser: 1.2 mg/dL — ABNORMAL HIGH (ref 0.57–1.00)
GFR calc non Af Amer: 43 mL/min/{1.73_m2} — ABNORMAL LOW (ref >59)
GFR, EST AFRICAN AMERICAN: 49 mL/min/{1.73_m2} — AB (ref >59)
Globulin, Total: 2.6 g/dL (ref 1.5–4.5)
Glucose: 130 mg/dL — ABNORMAL HIGH (ref 65–99)
POTASSIUM: 4.1 mmol/L (ref 3.5–5.2)
Sodium: 141 mmol/L (ref 134–144)
Total Protein: 6.6 g/dL (ref 6.0–8.5)

## 2016-03-27 LAB — TSH+FREE T4
FREE T4: 1.03 ng/dL (ref 0.82–1.77)
TSH: 3.05 u[IU]/mL (ref 0.450–4.500)

## 2016-03-27 NOTE — Therapy (Signed)
[facility] [place], [name], [address] Phone: [phone]   Fax:  [phone]  Wound Care Therapy  Patient Details  Name: [name] MRN: [id number] Date of Birth: [birth year] Referring Provider: [name]   Encounter Date: 03/27/2016    Past Medical History:  Diagnosis Date  . Chronic lung disease    on home O2   . Complete heart block (HCC)    s/p PPM by Dr Rayann Heman  . Debilitated   . Diastolic dysfunction   . Hypertension   . Obesity   . Permanent atrial fibrillation ([place])   . Pulmonary hypertension   . Rheumatoid arthritis(714.0)   . Sleep apnea   . Stroke [facility])    remote  . Venous insufficiency    with chronic leg ulcers    Past Surgical History:  Procedure Laterality Date  . PACEMAKER INSERTION  08/13/10   SJM by Dr Rayann Heman    There were no vitals filed for this visit.                  Wound Therapy - 03/27/16 1302    Subjective Daughter states she can already tell an improvement in her mobility indicating her leg is not hurting as much.  Pt did have alot of sanginous drainage anteriorly, however daughter reports patient slid out of bed and may have disrupted it.  Pt reports tenderness but no pain.  Daughter also requests we look at a wound on her buttocks to see if this needs woundcare as well.     Patient and Family Stated Goals wounds to heal   Date of Onset 08/24/15   Pain Assessment No/denies pain   Wound Properties Date First Assessed: 01/03/16 Time First Assessed: 1234 Wound Type: Other (Comment) Location: Leg Location Orientation: Right Wound Description (Comments): 4 clustered wounds together on lateral LE Present on Admission: Yes   Dressing Type Gauze (Comment)   Dressing Changed Changed   Dressing Status Old drainage   Dressing Change Frequency PRN   Site / Wound Assessment Friable;Red;Painful;Dusky;Brown;Bleeding   % Wound base Red or Granulating 50%   % Wound base  Yellow/Fibrinous Exudate 50%   Peri-wound Assessment Erythema (blanchable);Hemosiderin   Drainage Amount Moderate  min to moderate    Drainage Description Serosanguineous;Purulent   Treatment Cleansed;Debridement (Selective)   Wound Properties Date First Assessed: 09/24/15 Time First Assessed: 1133 Wound Type: Other (Comment) , venous stasis ulcer   Location: Leg Location Orientation: Right;Distal Wound Description (Comments): anterior wounds (in horseshoe appearance)  Present on Admission: Yes   Dressing Type Foam;Gauze (Comment)   Dressing Changed Changed   Dressing Status Old drainage   Dressing Change Frequency PRN   Site / Wound Assessment Friable;Red;Painful;Dusky;Brown   % Wound base Red or Granulating 50%   % Wound base Yellow/Fibrinous Exudate 50%   Peri-wound Assessment Erythema (blanchable);Pink   Drainage Amount Moderate   Drainage Description Serosanguineous  green   Treatment Cleansed;Debridement (Selective)   Selective Debridement - Location Rt LE    Selective Debridement - Tools Used Forceps   Selective Debridement - Tissue Removed slough and dry skin  slough   Wound Therapy - Clinical Statement Decreased redness, however slightly increased edema and sanginous drainage.  Pt may have hit anterior LE when she slid off the bed causing the increased bleeding.  No bleeding currently.  Noted change in wound with increased duskiness and necrotic tissue.  Changed dressing to medihoney to see if improvement over silver.  Unsure if patient is sensitive to sivler as usually do not get a good response.  Added profore, loosely, however no foam this session.  Pt reported overall comfort at end of session.  Wound was cultured and took sample to the lab per order.  wound on Lt buttock 1.5X.5, shallow and 90% granulated.  Instructed daughter to use xeroform and medipore tape.     Factors Delaying/Impairing Wound Healing Infection - systemic/local;Multiple medical problems;Vascular compromise    Hydrotherapy Plan Debridement;Dressing change;Patient/family education   Wound Therapy - Frequency --  continue 2x a week for 4 wks; if no improvement return MD if   Wound Plan Continue per current plan with increase to 2X week.    Begin compression  Possibly change dressing to medihoney alginate if needed.  Check on results of wound culture.   Dressing  medihoney gel gauze, ABD pad, profore, netting #5                 PT Education - 03/27/16 1337    Education provided Yes   Education Details instructed to follow up with wound culture to assure correct antibiotic if she has not heard from them by monday at lunch.   Person(s) Educated Child(ren);Patient   Methods Explanation   Comprehension Verbalized understanding          PT Short Term Goals - 02/15/16 1900      PT SHORT TERM GOAL #1   Title Pt to verbalize the importance of using compression every day to keep legs wound free   Time 1   Period Weeks   Status Achieved     PT SHORT TERM GOAL #2   Title Pt to state that the pain in her legs are no greater than 3/10 to allow pt to ambulate for five minutes without stopping    Time 2   Period Weeks   Status Achieved     PT SHORT TERM GOAL #3   Title Pt to have only 5 wounds on Rt LE and 2 on Lt LE    Time 2   Period Weeks   Status Partially Met           PT Long Term Goals - 02/15/16 1900      PT LONG TERM GOAL #1   Title Pt to be wound free to reduce risk of infection    Time 4   Period Weeks   Status On-going     PT LONG TERM GOAL #2   Title Pt to have obtained compression garment and to be able to don and doff with minimal assist from family    Time 4   Period Weeks   Status On-going             Patient will benefit from skilled therapeutic intervention in order to improve the following deficits and impairments:     Visit Diagnosis: Lymphedema of right lower extremity  Chronic ulcer of leg, limited to breakdown of skin, unspecified  laterality (HCC)  Pain in right lower leg  Unsteadiness on feet  Stasis dermatitis of both legs  Venous insufficiency  Varicose veins of right lower extremity with ulcer other part of lower leg (HCC)  Pain in left lower leg  Varicose veins of left lower extremity with ulcer other part of lower leg ([place])     Problem List Patient Active Problem List   Diagnosis Date Noted  . Alzheimer's disease 10/11/2014  . Essential hypertension 10/11/2014  . Cardiomyopathy, dilated ([place])  07/24/2014  . Apnea, sleep 07/24/2014  . Anemia 04/28/2014  . Chronic hypoxemic respiratory failure ([place]) 04/12/2014  . Chronic ulcer of leg ([place]) 04/12/2014  . Lymphedema of leg 04/12/2014  . Disorder of nutrition 04/12/2014  . Hypothyroidism 03/24/2014  . Stasis dermatitis of both legs 05/06/2013  . Pacemaker-St.Jude 11/25/2011  . Complete heart block ([place]) 12/12/2010  . Permanent atrial fibrillation ([place]) 12/12/2010  . Diastolic dysfunction 99/77/4142  . Venous insufficiency 12/12/2010  . Long term current use of anticoagulant 09/30/2002    Teena Irani, PTA/CLT [phone]  03/27/2016, 1:38 PM  [facility] [address] [place], [name], [address] Phone: [phone]   Fax:  [phone]  Name: [name] MRN: [id number] Date of Birth: 1933-11-03

## 2016-03-28 ENCOUNTER — Telehealth (HOSPITAL_COMMUNITY): Payer: Self-pay | Admitting: Physical Therapy

## 2016-03-28 NOTE — Telephone Encounter (Signed)
Chas from the lab and wanted to know where the location of the cluture was taken. Pt's cluture came from Left Buttocks. And the referring MD's name. NF

## 2016-03-30 LAB — AEROBIC CULTURE W GRAM STAIN (SUPERFICIAL SPECIMEN)

## 2016-03-30 LAB — AEROBIC CULTURE  (SUPERFICIAL SPECIMEN)

## 2016-04-01 ENCOUNTER — Encounter: Payer: Self-pay | Admitting: Family Medicine

## 2016-04-01 ENCOUNTER — Ambulatory Visit (INDEPENDENT_AMBULATORY_CARE_PROVIDER_SITE_OTHER): Payer: PPO | Admitting: Family Medicine

## 2016-04-01 ENCOUNTER — Ambulatory Visit (HOSPITAL_COMMUNITY): Payer: PPO | Admitting: Physical Therapy

## 2016-04-01 VITALS — BP 125/62 | HR 75 | Temp 98.5°F | Ht 59.0 in | Wt 146.0 lb

## 2016-04-01 DIAGNOSIS — I83018 Varicose veins of right lower extremity with ulcer other part of lower leg: Secondary | ICD-10-CM

## 2016-04-01 DIAGNOSIS — I890 Lymphedema, not elsewhere classified: Secondary | ICD-10-CM

## 2016-04-01 DIAGNOSIS — L97819 Non-pressure chronic ulcer of other part of right lower leg with unspecified severity: Secondary | ICD-10-CM

## 2016-04-01 DIAGNOSIS — L97912 Non-pressure chronic ulcer of unspecified part of right lower leg with fat layer exposed: Secondary | ICD-10-CM | POA: Diagnosis not present

## 2016-04-01 DIAGNOSIS — I739 Peripheral vascular disease, unspecified: Secondary | ICD-10-CM | POA: Diagnosis not present

## 2016-04-01 DIAGNOSIS — L97901 Non-pressure chronic ulcer of unspecified part of unspecified lower leg limited to breakdown of skin: Secondary | ICD-10-CM

## 2016-04-01 DIAGNOSIS — R2681 Unsteadiness on feet: Secondary | ICD-10-CM

## 2016-04-01 DIAGNOSIS — M79661 Pain in right lower leg: Secondary | ICD-10-CM

## 2016-04-01 DIAGNOSIS — I872 Venous insufficiency (chronic) (peripheral): Secondary | ICD-10-CM

## 2016-04-01 MED ORDER — SULFAMETHOXAZOLE-TRIMETHOPRIM 800-160 MG PO TABS: 1.0000 | ORAL_TABLET | Freq: Two times a day (BID) | ORAL | 0 refills | Status: DC

## 2016-04-01 NOTE — Therapy (Signed)
[facility] [address] [place], [name], [address] Phone: [phone]   Fax:  [phone]  Physical Therapy Treatment  Patient Details  Name: [name] MRN: [id number] Date of Birth: February 06, 1933 Referring Provider: [name]   Encounter Date: 04/01/2016      PT End of Session - 04/01/16 1814    Visit Number 35   Number of Visits 41   Date for PT Re-Evaluation 03/08/16   Authorization Type [employer] : g code and recert done at visit 89   Authorization - Visit Number 35   Authorization - Number of Visits 41   PT Start Time 1632   PT Stop Time 1720   PT Time Calculation (min) 48 min   Activity Tolerance Patient tolerated treatment well   Behavior During Therapy [facility] for tasks assessed/performed      Past Medical History:  Diagnosis Date  . Chronic lung disease    on home O2   . Complete heart block (HCC)    s/p PPM by Dr Rayann Heman  . Debilitated   . Diastolic dysfunction   . Hypertension   . Obesity   . Permanent atrial fibrillation ([place])   . Pulmonary hypertension   . Rheumatoid arthritis(714.0)   . Sleep apnea   . Stroke [facility])    remote  . Venous insufficiency    with chronic leg ulcers    Past Surgical History:  Procedure Laterality Date  . PACEMAKER INSERTION  08/13/10   SJM by Dr Rayann Heman    There were no vitals filed for this visit.                     Wound Therapy - 04/01/16 1805    Subjective Pt comes today after visit to MD office.  STates MD removed dressing and it has been hurting worse ever since.  States MD changed antibiotic after getting results from culture.    Patient and Family Stated Goals wounds to heal   Date of Onset 08/24/15   Pain Assessment No/denies pain   Faces Pain Scale Hurts whole lot   Pain Type Acute pain   Pain Location Leg   Pain Orientation Right   Pain Descriptors / Indicators Burning;Grimacing;Guarding   Pain Onset Sudden   Patients Stated  Pain Goal 0   Wound Properties Date First Assessed: 01/03/16 Time First Assessed: 1234 Wound Type: Other (Comment) Location: Leg Location Orientation: Right Wound Description (Comments): 4 clustered wounds together on lateral LE Present on Admission: Yes   Dressing Type Gauze (Comment)   Dressing Changed Changed   Dressing Status Old drainage   Dressing Change Frequency PRN   Site / Wound Assessment Friable;Red;Painful;Dusky;Brown;Bleeding   % Wound base Red or Granulating 50%   % Wound base Yellow/Fibrinous Exudate 50%   Peri-wound Assessment Erythema (blanchable);Hemosiderin   Drainage Amount Moderate  min to moderate    Drainage Description Serosanguineous;Purulent   Treatment Cleansed;Debridement (Selective)   Wound Properties Date First Assessed: 09/24/15 Time First Assessed: 1133 Wound Type: Other (Comment) , venous stasis ulcer   Location: Leg Location Orientation: Right;Distal Wound Description (Comments): anterior wounds (in horseshoe appearance)  Present on Admission: Yes   Dressing Type Foam;Gauze (Comment)   Dressing Changed Changed   Dressing Status Old drainage   Dressing Change Frequency PRN   Site / Wound Assessment Friable;Red;Painful;Dusky;Brown   % Wound base Red or Granulating 50%   % Wound base Yellow/Fibrinous Exudate 50%   Peri-wound Assessment  Erythema (blanchable);Pink   Drainage Amount Moderate   Drainage Description Serosanguineous  green   Treatment Cleansed;Debridement (Selective)   Selective Debridement - Location --   Selective Debridement - Tools Used --   Selective Debridement - Tissue Removed --  slough   Wound Therapy - Clinical Statement No debridment needed this session.  Irrigated LE well and redressed using medihoney.  Pt very tender with area bleeding steadily upon dressing removal.  Dressed area with extra padding and light compression.  Pt reported overall comfort following session.  Daughter requested to look at bottom as well and appears to  be healing well with no further breakdown.     Factors Delaying/Impairing Wound Healing Infection - systemic/local;Multiple medical problems;Vascular compromise   Hydrotherapy Plan Debridement;Dressing change;Patient/family education   Wound Therapy - Frequency  continue 2x a week for 4 wks; if no improvement return MD if   Wound Plan Continue per current plan with increase to 2X week.       Dressing  medihoney gel gauze, ABD pad, profore, netting #5                    PT Short Term Goals - 02/15/16 1900      PT SHORT TERM GOAL #1   Title Pt to verbalize the importance of using compression every day to keep legs wound free   Time 1   Period Weeks   Status Achieved     PT SHORT TERM GOAL #2   Title Pt to state that the pain in her legs are no greater than 3/10 to allow pt to ambulate for five minutes without stopping    Time 2   Period Weeks   Status Achieved     PT SHORT TERM GOAL #3   Title Pt to have only 5 wounds on Rt LE and 2 on Lt LE    Time 2   Period Weeks   Status Partially Met           PT Long Term Goals - 02/15/16 1900      PT LONG TERM GOAL #1   Title Pt to be wound free to reduce risk of infection    Time 4   Period Weeks   Status On-going     PT LONG TERM GOAL #2   Title Pt to have obtained compression garment and to be able to don and doff with minimal assist from family    Time 4   Period Weeks   Status On-going             Patient will benefit from skilled therapeutic intervention in order to improve the following deficits and impairments:     Visit Diagnosis: Lymphedema of right lower extremity  Chronic ulcer of leg, limited to breakdown of skin, unspecified laterality (HCC)  Pain in right lower leg  Unsteadiness on feet  Stasis dermatitis of both legs  Venous insufficiency  Varicose veins of right lower extremity with ulcer other part of lower leg ([place])     Problem List Patient Active Problem List    Diagnosis Date Noted  . Alzheimer's disease 10/11/2014  . Essential hypertension 10/11/2014  . Cardiomyopathy, dilated ([place]) 07/24/2014  . Apnea, sleep 07/24/2014  . Anemia 04/28/2014  . Chronic hypoxemic respiratory failure ([place]) 04/12/2014  . Chronic ulcer of leg ([place]) 04/12/2014  . Lymphedema of leg 04/12/2014  . Disorder of nutrition 04/12/2014  . Hypothyroidism 03/24/2014  . Stasis dermatitis of both legs 05/06/2013  .  Pacemaker-St.Jude 11/25/2011  . Complete heart block ([place]) 12/12/2010  . Permanent atrial fibrillation ([place]) 12/12/2010  . Diastolic dysfunction 90/90/3014  . Venous insufficiency 12/12/2010  . Long term current use of anticoagulant 09/30/2002    Teena Irani, PTA/CLT [phone]  04/01/2016, 6:18 PM  [facility] [address] [place], [name], [address] Phone: [phone]   Fax:  [phone]  Name: YASEMIN RABON MRN: [id number] Date of Birth: 04-May-1933

## 2016-04-01 NOTE — Progress Notes (Signed)
Subjective:  Patient ID: [name], female    DOB: 04/04/1933  Age: 83 y.o. MRN: [id number]  CC: Venous Insufficiency (pt here today for follow up on her leg, has appt at wound center later this afternoon)   HPI DELMAR KNOLL presents for venous stasis ulceration. Getting tx at the [facility]. In today for progress. Culture taken there last week shows MRSA & Klebsiella Oxytoca. Neither sensitive to Augmentin, but both sensitive to sulfa.    History Kymm has a past medical history of Chronic lung disease; Complete heart block ([place]); Debilitated; Diastolic dysfunction; Hypertension; Obesity; Permanent atrial fibrillation ([place]); Pulmonary hypertension; Rheumatoid arthritis(714.0); Sleep apnea; Stroke [facility]); and Venous insufficiency.   She has a past surgical history that includes Pacemaker insertion (08/13/10).   Her family history is not on file.She reports that she has never smoked. She has never used smokeless tobacco. She reports that she does not drink alcohol or use drugs.    ROS Review of Systems  Constitutional: Negative for activity change, appetite change and fever.  HENT: Negative for congestion, rhinorrhea and sore throat.   Eyes: Negative for visual disturbance.  Respiratory: Negative for cough and shortness of breath.   Cardiovascular: Negative for chest pain and palpitations.  Gastrointestinal: Negative for abdominal pain, diarrhea and nausea.  Genitourinary: Negative for dysuria.  Musculoskeletal: Negative for arthralgias and myalgias.  Skin: Positive for wound.    Objective:  BP 125/62   Pulse 75   Temp 98.5 F (36.9 C) (Oral)   Ht 4\' 11"  (1.499 m)   Wt 146 lb (66.2 kg)   BMI 29.49 kg/m   BP Readings from Last 3 Encounters:  04/01/16 125/62  03/26/16 128/73  02/25/16 118/63    Wt Readings from Last 3 Encounters:  04/01/16 146 lb (66.2 kg)  03/26/16 143 lb (64.9 kg)  02/25/16 139 lb (63 kg)     Physical Exam  Constitutional: She is  oriented to person, place, and time. She appears well-developed and well-nourished. No distress.  Cardiovascular: Normal rate and regular rhythm.   Pulmonary/Chest: Breath sounds normal.  Neurological: She is alert and oriented to person, place, and time.  Skin: Skin is warm and dry.  4 is macerated ulceration of the right shin. This covers 80% plus of the skin surface from the tibial tuberosity to the malleolus. it is grade 2 depth. There is a great deal of mucoid mattering with some exudative matter.      Psychiatric: She has a normal mood and affect.      Assessment & Plan:   Justina was seen today for venous insufficiency.  Diagnoses and all orders for this visit:  Peripheral vascular disease ([place]) -     Ambulatory referral to Vascular Surgery  Chronic ulcer of right lower extremity with fat layer exposed ([place]) -     Ambulatory referral to Vascular Surgery  Other orders -     sulfamethoxazole-trimethoprim (BACTRIM DS,SEPTRA DS) 800-160 MG tablet; Take 1 tablet by mouth 2 (two) times daily.       I have discontinued Ms. Cleek's amoxicillin-clavulanate and doxycycline. I am also having her start on sulfamethoxazole-trimethoprim. Additionally, I am having her maintain her OXYGEN-HELIUM IN, ferrous sulfate, multivitamin with minerals, traMADol, potassium chloride, levothyroxine, hydrocortisone, apixaban, furosemide, simvastatin, memantine, and donepezil.  Allergies as of 04/01/2016   No Known Allergies     Medication List       Accurate as of 04/01/16  1:46 PM. Always use your most recent  med list.          apixaban 5 MG Tabs tablet Commonly known as:  ELIQUIS Take 1 tablet (5 mg total) by mouth 2 (two) times daily.   donepezil 10 MG tablet Commonly known as:  ARICEPT TAKE ONE (1) TABLET AT BEDTIME   ferrous sulfate 325 (65 FE) MG EC tablet Take 1 tablet (325 mg total) by mouth 3 (three) times daily with meals.   furosemide 40 MG tablet Commonly known as:   LASIX TAKE 1 TABLET EVERY MORNING AND TAKE 1/2 TABLET AT LUNCH   hydrocortisone 2.5 % rectal cream Commonly known as:  PROCTOZONE-HC Place 1 application rectally 2 (two) times daily.   levothyroxine 50 MCG tablet Commonly known as:  SYNTHROID, LEVOTHROID TAKE ONE (1) TABLET EACH DAY   memantine 10 MG tablet Commonly known as:  NAMENDA Take 1 tablet (10 mg total) by mouth 2 (two) times daily.   multivitamin with minerals tablet Take 1 tablet by mouth daily.   OXYGEN Inhale into the lungs continuous. 4L   potassium chloride 10 MEQ tablet Commonly known as:  K-DUR,KLOR-CON Take 1 tablet (10 mEq total) by mouth daily.   simvastatin 20 MG tablet Commonly known as:  ZOCOR TAKE ONE (1) TABLET EACH DAY   sulfamethoxazole-trimethoprim 800-160 MG tablet Commonly known as:  BACTRIM DS,SEPTRA DS Take 1 tablet by mouth 2 (two) times daily.   traMADol 50 MG tablet Commonly known as:  ULTRAM Take 1 tablet (50 mg total) by mouth 3 (three) times daily as needed for moderate pain.      Discussion with caretaker, the daughter. The leg itself seems to be in question at this point. It is debatable to what degree it is salvageable. Wound care has done all they can with it. They will continue to work with it for now. Definitive decision-making should be with the input of vascular surgery service.  Follow-up: Return in about 3 months (around 07/02/2016).  [name], M.D.

## 2016-04-02 ENCOUNTER — Other Ambulatory Visit: Payer: Self-pay | Admitting: Family Medicine

## 2016-04-03 ENCOUNTER — Ambulatory Visit (HOSPITAL_COMMUNITY): Payer: PPO | Admitting: Physical Therapy

## 2016-04-03 DIAGNOSIS — M79661 Pain in right lower leg: Secondary | ICD-10-CM

## 2016-04-03 DIAGNOSIS — L97819 Non-pressure chronic ulcer of other part of right lower leg with unspecified severity: Secondary | ICD-10-CM

## 2016-04-03 DIAGNOSIS — L97901 Non-pressure chronic ulcer of unspecified part of unspecified lower leg limited to breakdown of skin: Secondary | ICD-10-CM

## 2016-04-03 DIAGNOSIS — I83018 Varicose veins of right lower extremity with ulcer other part of lower leg: Secondary | ICD-10-CM

## 2016-04-03 DIAGNOSIS — I890 Lymphedema, not elsewhere classified: Secondary | ICD-10-CM

## 2016-04-03 DIAGNOSIS — I872 Venous insufficiency (chronic) (peripheral): Secondary | ICD-10-CM

## 2016-04-03 DIAGNOSIS — I83028 Varicose veins of left lower extremity with ulcer other part of lower leg: Secondary | ICD-10-CM

## 2016-04-03 DIAGNOSIS — M79662 Pain in left lower leg: Secondary | ICD-10-CM

## 2016-04-03 DIAGNOSIS — R2681 Unsteadiness on feet: Secondary | ICD-10-CM

## 2016-04-03 DIAGNOSIS — L97829 Non-pressure chronic ulcer of other part of left lower leg with unspecified severity: Secondary | ICD-10-CM

## 2016-04-03 NOTE — Therapy (Signed)
[facility] [address] [place], [name], [address] Phone: [phone]   Fax:  [phone]  Wound Care Therapy  Patient Details  Name: JAMILYN PIGEON MRN: [id number] Date of Birth: 03/21/1933 Referring Provider: [name]   Encounter Date: 04/03/2016      PT End of Session - 04/03/16 1541    Visit Number 36   Number of Visits 41   Date for PT Re-Evaluation 03/08/16   Authorization Type [employer] : g code and recert done at visit 72   Authorization - Visit Number 78   Authorization - Number of Visits 41   PT Start Time 1125   PT Stop Time 1215   PT Time Calculation (min) 50 min   Activity Tolerance Patient tolerated treatment well   Behavior During Therapy [facility] for tasks assessed/performed      Past Medical History:  Diagnosis Date  . Chronic lung disease    on home O2   . Complete heart block (HCC)    s/p PPM by Dr Rayann Heman  . Debilitated   . Diastolic dysfunction   . Hypertension   . Obesity   . Permanent atrial fibrillation ([place])   . Pulmonary hypertension   . Rheumatoid arthritis(714.0)   . Sleep apnea   . Stroke [facility])    remote  . Venous insufficiency    with chronic leg ulcers    Past Surgical History:  Procedure Laterality Date  . PACEMAKER INSERTION  08/13/10   SJM by Dr Rayann Heman    There were no vitals filed for this visit.       Subjective Assessment - 04/03/16 1343    Subjective PT states she feels like her knee is doing better.  Returns to MD 3/15   Currently in Pain? No/denies                   Wound Therapy - 04/03/16 1346    Subjective Pt with noted improvment in ambulation.  states she is not hurting as much today, just a little tender.   Patient and Family Stated Goals wounds to heal   Date of Onset 08/24/15   Pain Assessment No/denies pain   Faces Pain Scale Hurts whole lot   Wound Properties Date First Assessed: 01/03/16 Time First Assessed: 1234 Wound Type:  Other (Comment) Location: Leg Location Orientation: Right Wound Description (Comments): 4 clustered wounds together on lateral LE Present on Admission: Yes   Dressing Type Gauze (Comment)   Dressing Changed Changed   Dressing Status Old drainage   Dressing Change Frequency PRN   Site / Wound Assessment Friable;Red;Painful;Dusky;Brown;Bleeding   % Wound base Red or Granulating 75%   % Wound base Yellow/Fibrinous Exudate 25%   Peri-wound Assessment Erythema (blanchable);Hemosiderin   Drainage Amount Minimal  min to moderate    Drainage Description Serosanguineous;Purulent   Treatment Cleansed;Debridement (Selective)   Wound Properties Date First Assessed: 09/24/15 Time First Assessed: 1133 Wound Type: Other (Comment) , venous stasis ulcer   Location: Leg Location Orientation: Right;Distal Wound Description (Comments): anterior wounds (in horseshoe appearance)  Present on Admission: Yes   Dressing Type Foam;Gauze (Comment)   Dressing Changed Changed   Dressing Status Old drainage   Dressing Change Frequency PRN   Site / Wound Assessment Friable;Red;Painful;Dusky;Brown   % Wound base Red or Granulating 75%   % Wound base Yellow/Fibrinous Exudate 25%   Peri-wound Assessment Erythema (blanchable);Pink   Drainage Amount Minimal   Drainage Description Serosanguineous  green  Treatment Cleansed;Debridement (Selective)   Selective Debridement - Location Rt LE    Selective Debridement - Tools Used Forceps   Selective Debridement - Tissue Removed slough and dry skin  slough   Wound Therapy - Clinical Statement Overall improvent noted this session and no pain when working on wounds.  Able to remove slough wtih increased granluatlion.  Continued with medihoney colloid as with good response.  Checked Lt LE with extreme dryness/flakiness and noted drainage from lateral ankle.  One small area approx 1cm2 in size.  Cleansed and moisturized Lt LE well and placed honey alginate over lateral wound.   Educated patient on proper wear time/care of garment (see education).  Used profore on Lt LE this session as well.  PT reported overall comfort with bandages.      Factors Delaying/Impairing Wound Healing Infection - systemic/local;Multiple medical problems;Vascular compromise   Hydrotherapy Plan Debridement;Dressing change;Patient/family education   Wound Therapy - Frequency --  continue 2x a week for 4 wks; if no improvement return MD if   Wound Plan Continue per current plan with increase to 2X week.   Dressing  Rt WL:[id number] colloid, ABD pad, profore, netting #5   Dressing LT LE:  medihoney colloid,  lotion f/b vaseline, profore dressing                 PT Education - 04/03/16 1540    Education provided Yes   Education Details Educated on donning/doffing stocking daily.  Importance of cleansing and moisturizing LE to prevent wounds/edema.  Pt verbalized understanding.    Person(s) Educated Patient;Child(ren)   Methods Explanation   Comprehension Verbalized understanding          PT Short Term Goals - 02/15/16 1900      PT SHORT TERM GOAL #1   Title Pt to verbalize the importance of using compression every day to keep legs wound free   Time 1   Period Weeks   Status Achieved     PT SHORT TERM GOAL #2   Title Pt to state that the pain in her legs are no greater than 3/10 to allow pt to ambulate for five minutes without stopping    Time 2   Period Weeks   Status Achieved     PT SHORT TERM GOAL #3   Title Pt to have only 5 wounds on Rt LE and 2 on Lt LE    Time 2   Period Weeks   Status Partially Met           PT Long Term Goals - 02/15/16 1900      PT LONG TERM GOAL #1   Title Pt to be wound free to reduce risk of infection    Time 4   Period Weeks   Status On-going     PT LONG TERM GOAL #2   Title Pt to have obtained compression garment and to be able to don and doff with minimal assist from family    Time 4   Period Weeks   Status On-going              Patient will benefit from skilled therapeutic intervention in order to improve the following deficits and impairments:     Visit Diagnosis: Lymphedema of right lower extremity  Chronic ulcer of leg, limited to breakdown of skin, unspecified laterality (HCC)  Pain in right lower leg  Unsteadiness on feet  Stasis dermatitis of both legs  Venous insufficiency  Varicose veins of right lower  extremity with ulcer other part of lower leg (HCC)  Pain in left lower leg  Varicose veins of left lower extremity with ulcer other part of lower leg [facility])     Problem List Patient Active Problem List   Diagnosis Date Noted  . Alzheimer's disease 10/11/2014  . Essential hypertension 10/11/2014  . Cardiomyopathy, dilated ([place]) 07/24/2014  . Apnea, sleep 07/24/2014  . Anemia 04/28/2014  . Chronic hypoxemic respiratory failure ([place]) 04/12/2014  . Chronic ulcer of leg ([place]) 04/12/2014  . Lymphedema of leg 04/12/2014  . Disorder of nutrition 04/12/2014  . Hypothyroidism 03/24/2014  . Stasis dermatitis of both legs 05/06/2013  . Pacemaker-St.Jude 11/25/2011  . Complete heart block ([place]) 12/12/2010  . Permanent atrial fibrillation ([place]) 12/12/2010  . Diastolic dysfunction 27/61/4709  . Venous insufficiency 12/12/2010  . Long term current use of anticoagulant 09/30/2002    Teena Irani, PTA/CLT [phone]  04/03/2016, 3:45 PM  [place], [name], [address] Phone: [phone]   Fax:  [phone]  Name: JOHNETTE TEIGEN MRN: [id number] Date of Birth: February 04, 1933

## 2016-04-04 ENCOUNTER — Encounter (HOSPITAL_COMMUNITY): Payer: Self-pay

## 2016-04-04 ENCOUNTER — Emergency Department (HOSPITAL_COMMUNITY): Payer: PPO

## 2016-04-04 ENCOUNTER — Inpatient Hospital Stay (HOSPITAL_COMMUNITY)
Admission: EM | Admit: 2016-04-04 | Discharge: 2016-04-06 | DRG: 291 | Disposition: A | Discharge status: Home / Self Care | Payer: PPO | Attending: Family Medicine | Admitting: Family Medicine

## 2016-04-04 DIAGNOSIS — E039 Hypothyroidism, unspecified: Secondary | ICD-10-CM | POA: Diagnosis not present

## 2016-04-04 DIAGNOSIS — I872 Venous insufficiency (chronic) (peripheral): Secondary | ICD-10-CM | POA: Diagnosis not present

## 2016-04-04 DIAGNOSIS — F0390 Unspecified dementia without behavioral disturbance: Secondary | ICD-10-CM | POA: Diagnosis present

## 2016-04-04 DIAGNOSIS — T501X5A Adverse effect of loop [high-ceiling] diuretics, initial encounter: Secondary | ICD-10-CM | POA: Diagnosis present

## 2016-04-04 DIAGNOSIS — E785 Hyperlipidemia, unspecified: Secondary | ICD-10-CM | POA: Diagnosis not present

## 2016-04-04 DIAGNOSIS — J189 Pneumonia, unspecified organism: Secondary | ICD-10-CM | POA: Diagnosis present

## 2016-04-04 DIAGNOSIS — Z9981 Dependence on supplemental oxygen: Secondary | ICD-10-CM | POA: Diagnosis not present

## 2016-04-04 DIAGNOSIS — I890 Lymphedema, not elsewhere classified: Secondary | ICD-10-CM | POA: Diagnosis present

## 2016-04-04 DIAGNOSIS — J9611 Chronic respiratory failure with hypoxia: Secondary | ICD-10-CM | POA: Diagnosis not present

## 2016-04-04 DIAGNOSIS — I340 Nonrheumatic mitral (valve) insufficiency: Secondary | ICD-10-CM | POA: Diagnosis not present

## 2016-04-04 DIAGNOSIS — I447 Left bundle-branch block, unspecified: Secondary | ICD-10-CM | POA: Diagnosis present

## 2016-04-04 DIAGNOSIS — B9689 Other specified bacterial agents as the cause of diseases classified elsewhere: Secondary | ICD-10-CM | POA: Diagnosis not present

## 2016-04-04 DIAGNOSIS — I110 Hypertensive heart disease with heart failure: Principal | ICD-10-CM | POA: Diagnosis present

## 2016-04-04 DIAGNOSIS — B9561 Methicillin susceptible Staphylococcus aureus infection as the cause of diseases classified elsewhere: Secondary | ICD-10-CM | POA: Diagnosis not present

## 2016-04-04 DIAGNOSIS — B961 Klebsiella pneumoniae [K. pneumoniae] as the cause of diseases classified elsewhere: Secondary | ICD-10-CM | POA: Diagnosis not present

## 2016-04-04 DIAGNOSIS — R0602 Shortness of breath: Secondary | ICD-10-CM | POA: Diagnosis not present

## 2016-04-04 DIAGNOSIS — I5033 Acute on chronic diastolic (congestive) heart failure: Secondary | ICD-10-CM | POA: Diagnosis not present

## 2016-04-04 DIAGNOSIS — Z7901 Long term (current) use of anticoagulants: Secondary | ICD-10-CM

## 2016-04-04 DIAGNOSIS — I482 Chronic atrial fibrillation: Secondary | ICD-10-CM | POA: Diagnosis present

## 2016-04-04 DIAGNOSIS — L97919 Non-pressure chronic ulcer of unspecified part of right lower leg with unspecified severity: Secondary | ICD-10-CM | POA: Diagnosis not present

## 2016-04-04 DIAGNOSIS — Z8673 Personal history of transient ischemic attack (TIA), and cerebral infarction without residual deficits: Secondary | ICD-10-CM | POA: Diagnosis not present

## 2016-04-04 DIAGNOSIS — J181 Lobar pneumonia, unspecified organism: Secondary | ICD-10-CM | POA: Diagnosis not present

## 2016-04-04 DIAGNOSIS — I509 Heart failure, unspecified: Secondary | ICD-10-CM | POA: Diagnosis not present

## 2016-04-04 DIAGNOSIS — I10 Essential (primary) hypertension: Secondary | ICD-10-CM | POA: Diagnosis present

## 2016-04-04 DIAGNOSIS — E669 Obesity, unspecified: Secondary | ICD-10-CM | POA: Diagnosis present

## 2016-04-04 DIAGNOSIS — N179 Acute kidney failure, unspecified: Secondary | ICD-10-CM | POA: Diagnosis present

## 2016-04-04 DIAGNOSIS — I4821 Permanent atrial fibrillation: Secondary | ICD-10-CM | POA: Diagnosis present

## 2016-04-04 DIAGNOSIS — J984 Other disorders of lung: Secondary | ICD-10-CM | POA: Diagnosis present

## 2016-04-04 DIAGNOSIS — R0902 Hypoxemia: Secondary | ICD-10-CM | POA: Diagnosis not present

## 2016-04-04 DIAGNOSIS — Z6829 Body mass index (BMI) 29.0-29.9, adult: Secondary | ICD-10-CM | POA: Diagnosis not present

## 2016-04-04 DIAGNOSIS — Z950 Presence of cardiac pacemaker: Secondary | ICD-10-CM

## 2016-04-04 HISTORY — DX: Nonrheumatic mitral (valve) insufficiency: I34.0

## 2016-04-04 HISTORY — DX: Lymphedema, not elsewhere classified: I89.0

## 2016-04-04 LAB — URINALYSIS, ROUTINE W REFLEX MICROSCOPIC
BILIRUBIN URINE: NEGATIVE
Bacteria, UA: NONE SEEN
Glucose, UA: NEGATIVE mg/dL
Ketones, ur: NEGATIVE mg/dL
LEUKOCYTES UA: NEGATIVE
NITRITE: NEGATIVE
PH: 6 (ref 5.0–8.0)
Protein, ur: NEGATIVE mg/dL
SPECIFIC GRAVITY, URINE: 1.019 (ref 1.005–1.030)

## 2016-04-04 LAB — CBC WITH DIFFERENTIAL/PLATELET
BASOS ABS: 0 10*3/uL (ref 0.0–0.1)
BASOS PCT: 0 %
EOS PCT: 0 %
Eosinophils Absolute: 0 10*3/uL (ref 0.0–0.7)
HEMATOCRIT: 31.2 % — AB (ref 36.0–46.0)
Hemoglobin: 9.8 g/dL — ABNORMAL LOW (ref 12.0–15.0)
LYMPHS PCT: 10 %
Lymphs Abs: 1.4 10*3/uL (ref 0.7–4.0)
MCH: 28.9 pg (ref 26.0–34.0)
MCHC: 31.4 g/dL (ref 30.0–36.0)
MCV: 92 fL (ref 78.0–100.0)
MONO ABS: 0.7 10*3/uL (ref 0.1–1.0)
MONOS PCT: 5 %
NEUTROS ABS: 11.9 10*3/uL — AB (ref 1.7–7.7)
Neutrophils Relative %: 85 %
PLATELETS: 359 10*3/uL (ref 150–400)
RBC: 3.39 MIL/uL — ABNORMAL LOW (ref 3.87–5.11)
RDW: 14.4 % (ref 11.5–15.5)
WBC: 14.1 10*3/uL — ABNORMAL HIGH (ref 4.0–10.5)

## 2016-04-04 LAB — COMPREHENSIVE METABOLIC PANEL
ALK PHOS: 57 U/L (ref 38–126)
ALT: 12 U/L — AB (ref 14–54)
ANION GAP: 11 (ref 5–15)
AST: 21 U/L (ref 15–41)
Albumin: 3.4 g/dL — ABNORMAL LOW (ref 3.5–5.0)
BILIRUBIN TOTAL: 0.5 mg/dL (ref 0.3–1.2)
BUN: 25 mg/dL — ABNORMAL HIGH (ref 6–20)
CALCIUM: 9.4 mg/dL (ref 8.9–10.3)
CO2: 29 mmol/L (ref 22–32)
CREATININE: 1.37 mg/dL — AB (ref 0.44–1.00)
Chloride: 103 mmol/L (ref 101–111)
GFR calc non Af Amer: 35 mL/min — ABNORMAL LOW (ref >60)
GFR, EST AFRICAN AMERICAN: 41 mL/min — AB (ref >60)
Glucose, Bld: 133 mg/dL — ABNORMAL HIGH (ref 65–99)
Potassium: 4.4 mmol/L (ref 3.5–5.1)
SODIUM: 143 mmol/L (ref 135–145)
TOTAL PROTEIN: 7.2 g/dL (ref 6.5–8.1)

## 2016-04-04 LAB — TROPONIN I: TROPONIN I: 0.03 ng/mL — AB (ref <0.03)

## 2016-04-04 LAB — LACTIC ACID, PLASMA: LACTIC ACID, VENOUS: 1.1 mmol/L (ref 0.5–1.9)

## 2016-04-04 LAB — BRAIN NATRIURETIC PEPTIDE: B Natriuretic Peptide: 970 pg/mL — ABNORMAL HIGH (ref 0.0–100.0)

## 2016-04-04 MED ORDER — ALBUTEROL SULFATE (2.5 MG/3ML) 0.083% IN NEBU
2.5000 mg | INHALATION_SOLUTION | Freq: Once | RESPIRATORY_TRACT | Status: AC
  Administered 2016-04-04: 2.5 mg via RESPIRATORY_TRACT
  Filled 2016-04-04: qty 3

## 2016-04-04 MED ORDER — FUROSEMIDE 40 MG PO TABS
40.0000 mg | ORAL_TABLET | Freq: Every morning | ORAL | Status: DC
  Administered 2016-04-05: 40 mg via ORAL
  Filled 2016-04-04: qty 1

## 2016-04-04 MED ORDER — SULFAMETHOXAZOLE-TRIMETHOPRIM 800-160 MG PO TABS
1.0000 | ORAL_TABLET | Freq: Two times a day (BID) | ORAL | Status: DC
  Administered 2016-04-05 – 2016-04-06 (×4): 1 via ORAL
  Filled 2016-04-04 (×4): qty 1

## 2016-04-04 MED ORDER — DONEPEZIL HCL 5 MG PO TABS
10.0000 mg | ORAL_TABLET | Freq: Every day | ORAL | Status: DC
  Administered 2016-04-05 (×2): 10 mg via ORAL
  Filled 2016-04-04 (×2): qty 2

## 2016-04-04 MED ORDER — DEXTROSE 5 % IV SOLN
500.0000 mg | Freq: Once | INTRAVENOUS | Status: AC
  Administered 2016-04-04: 500 mg via INTRAVENOUS
  Filled 2016-04-04: qty 500

## 2016-04-04 MED ORDER — DEXTROSE 5 % IV SOLN
1.0000 g | INTRAVENOUS | Status: DC
  Administered 2016-04-05: 1 g via INTRAVENOUS
  Filled 2016-04-04 (×2): qty 10

## 2016-04-04 MED ORDER — DEXTROSE 5 % IV SOLN
1.0000 g | Freq: Once | INTRAVENOUS | Status: AC
  Administered 2016-04-04: 1 g via INTRAVENOUS
  Filled 2016-04-04: qty 10

## 2016-04-04 MED ORDER — SIMVASTATIN 20 MG PO TABS
20.0000 mg | ORAL_TABLET | Freq: Every day | ORAL | Status: DC
  Administered 2016-04-05 – 2016-04-06 (×2): 20 mg via ORAL
  Filled 2016-04-04 (×2): qty 1

## 2016-04-04 MED ORDER — MEMANTINE HCL 10 MG PO TABS
10.0000 mg | ORAL_TABLET | Freq: Two times a day (BID) | ORAL | Status: DC
  Administered 2016-04-05 – 2016-04-06 (×4): 10 mg via ORAL
  Filled 2016-04-04 (×4): qty 1

## 2016-04-04 MED ORDER — LEVOTHYROXINE SODIUM 50 MCG PO TABS
50.0000 ug | ORAL_TABLET | Freq: Every day | ORAL | Status: DC
  Administered 2016-04-05 – 2016-04-06 (×2): 50 ug via ORAL
  Filled 2016-04-04 (×2): qty 1

## 2016-04-04 MED ORDER — DEXTROSE 5 % IV SOLN
500.0000 mg | INTRAVENOUS | Status: DC
  Administered 2016-04-05: 500 mg via INTRAVENOUS
  Filled 2016-04-04 (×2): qty 500

## 2016-04-04 MED ORDER — IPRATROPIUM-ALBUTEROL 0.5-2.5 (3) MG/3ML IN SOLN
3.0000 mL | Freq: Once | RESPIRATORY_TRACT | Status: AC
  Administered 2016-04-04: 3 mL via RESPIRATORY_TRACT
  Filled 2016-04-04: qty 3

## 2016-04-04 MED ORDER — APIXABAN 5 MG PO TABS
5.0000 mg | ORAL_TABLET | Freq: Two times a day (BID) | ORAL | Status: DC
  Administered 2016-04-05 – 2016-04-06 (×4): 5 mg via ORAL
  Filled 2016-04-04 (×4): qty 1

## 2016-04-04 MED ORDER — FUROSEMIDE 10 MG/ML IJ SOLN
40.0000 mg | Freq: Once | INTRAMUSCULAR | Status: AC
  Administered 2016-04-04: 40 mg via INTRAVENOUS
  Filled 2016-04-04: qty 4

## 2016-04-04 NOTE — H&P (Signed)
TRH H&P    Patient Demographics:    [name], is a 83 y.o. female  MRN: [id number]  DOB - February 12, 1933  Admit Date - 04/04/2016  Referring MD/NP/PA: Dr. Thurnell Garbe  Outpatient Primary MD for the patient is [name], MD  Patient coming from: Home  Chief Complaint  Patient presents with  . Shortness of Breath      HPI:    [name]  is a 83 y.o. female, With history of lymphedema of lower extremities, chronic ulcer of right leg, who was seen at wound care clinic yesterday. Wound culture revealed staph aureus and Klebsiella, patient started on Bactrim. She also has history of atrial fibrillation, status post permanent pacemaker placement, chronic anticoagulation with eliquis, chronic diastolic CHF.  This morning patient has been very lethargic, felt nausea but no vomiting. She also has been coughing up yellow colored phlegm. Also complains of worsening shortness of breath. Denies any chest pain. No vomiting or diarrhea. She does have history of chronic loose stools. At home patient's oxygen saturation was low and heart rate was up so family brought her to hospital for further evaluation.  In the ED chest x-ray showed pulmonary vascular congestion and suspected right upper lobe pneumonia. Lab work showed WBC 14.1, BUN 25, creatinine 1.37, lactate 1.1   Review of systems:      A full 10 point Review of Systems was done, except as stated above, all other Review of Systems were negative.   With Past History of the following :    Past Medical History:  Diagnosis Date  . Chronic lung disease    on home O2   . Complete heart block (HCC)    s/p PPM by Dr Rayann Heman  . Debilitated   . Diastolic dysfunction   . Hypertension   . Lymphedema of right lower extremity   . MI (mitral incompetence)   . Obesity   . Permanent atrial fibrillation ([place])   . Pulmonary hypertension   . Rheumatoid  arthritis(714.0)   . Sleep apnea   . Stroke [facility])    remote  . Venous insufficiency    with chronic leg ulcers      Past Surgical History:  Procedure Laterality Date  . PACEMAKER INSERTION  08/13/10   SJM by Dr Rayann Heman      Social History:      Social History  Substance Use Topics  . Smoking status: Never Smoker  . Smokeless tobacco: Never Used  . Alcohol use No       Family History :   No pertinent family history   Home Medications:   Prior to Admission medications   Medication Sig Start Date End Date Taking? Authorizing Provider  apixaban (ELIQUIS) 5 MG TABS tablet Take 1 tablet (5 mg total) by mouth 2 (two) times daily. Patient taking differently: Take 5 mg by mouth every morning.  12/19/15  Yes Arnoldo Lenis, MD  donepezil (ARICEPT) 10 MG tablet TAKE ONE (1) TABLET AT BEDTIME 03/20/16  Yes [name], MD  furosemide (LASIX) 40 MG tablet  TAKE 1 TABLET EVERY MORNING AND TAKE 1/2 TABLET AT LUNCH Patient taking differently: TAKE 1 TABLET EVERY MORNING 01/09/16  Yes [name], MD  levothyroxine (SYNTHROID, LEVOTHROID) 50 MCG tablet TAKE ONE (1) TABLET EACH DAY 08/02/15  Yes [name], MD  memantine (NAMENDA) 10 MG tablet Take 1 tablet (10 mg total) by mouth 2 (two) times daily. 02/22/16  Yes Eustaquio Maize, MD  Multiple Vitamins-Minerals (MULTIVITAMIN WITH MINERALS) tablet Take 1 tablet by mouth daily.   Yes Historical Provider, MD  OXYGEN-HELIUM IN Inhale 4 L into the lungs continuous. 4L   Yes Historical Provider, MD  simvastatin (ZOCOR) 20 MG tablet TAKE ONE (1) TABLET EACH DAY 04/02/16  Yes [name], MD  sulfamethoxazole-trimethoprim (BACTRIM DS,SEPTRA DS) 800-160 MG tablet Take 1 tablet by mouth 2 (two) times daily. 04/01/16  Yes [name], MD  traMADol (ULTRAM) 50 MG tablet Take 1 tablet (50 mg total) by mouth 3 (three) times daily as needed for moderate pain. 06/01/14  Yes [name], MD  hydrocortisone (PROCTOZONE-HC) 2.5 % rectal cream Place 1  application rectally 2 (two) times daily. Patient not taking: Reported on 04/04/2016 12/10/15   Mary-Margaret Hassell Done, FNP     Allergies:    No Known Allergies   Physical Exam:   Vitals  Blood pressure 133/92, pulse (!) 46, temperature 99 F (37.2 C), temperature source Rectal, resp. rate 24, height 4\' 11"  (1.499 m), weight 66.2 kg (146 lb), SpO2 96 %.  1.  General: Appears in no acute distress  2. Psychiatric:  Intact judgement and  insight, awake alert, oriented x 3.  3. Neurologic: No focal neurological deficits, all cranial nerves intact.Strength 5/5 all 4 extremities, sensation intact all 4 extremities, plantars down going.  4. Eyes :  anicteric sclerae, moist conjunctivae with no lid lag. PERRLA.  5. ENMT:  Oropharynx clear with moist mucous membranes and good dentition  6. Neck:  supple, no cervical lymphadenopathy appriciated, No thyromegaly  7. Respiratory : Normal respiratory effort, bibasilar crackles  8. Cardiovascular : RRR, no gallops, rubs or murmurs, no leg edema  9. Gastrointestinal:  Positive bowel sounds, abdomen soft, non-tender to palpation,no hepatosplenomegaly, no rigidity or guarding       10. Skin:  Both lower extremities are wrapped in [place] boots  11.Musculoskeletal:  Good muscle tone,  joints appear normal , no effusions,  normal range of motion    Data Review:    CBC  Recent Labs Lab 04/04/16 1933  WBC 14.1*  HGB 9.8*  HCT 31.2*  PLT 359  MCV 92.0  MCH 28.9  MCHC 31.4  RDW 14.4  LYMPHSABS 1.4  MONOABS 0.7  EOSABS 0.0  BASOSABS 0.0   ------------------------------------------------------------------------------------------------------------------  Chemistries   Recent Labs Lab 04/04/16 1933  NA 143  K 4.4  CL 103  CO2 29  GLUCOSE 133*  BUN 25*  CREATININE 1.37*  CALCIUM 9.4  AST 21  ALT 12*  ALKPHOS 57  BILITOT 0.5    ------------------------------------------------------------------------------------------------------------------  ------------------------------------------------------------------------------------------------------------------ GFR: Estimated Creatinine Clearance: 27.1 mL/min (by C-G formula based on SCr of 1.37 mg/dL (H)). Liver Function Tests:  Recent Labs Lab 04/04/16 1933  AST 21  ALT 12*  ALKPHOS 57  BILITOT 0.5  PROT 7.2  ALBUMIN 3.4*   Cardiac Enzymes:  Recent Labs Lab 04/04/16 1933  TROPONINI 0.03*    --------------------------------------------------------------------------------------------------------------- Urine analysis:    Component Value Date/Time   COLORURINE YELLOW 08/12/2010 1605   APPEARANCEUR Clear 06/07/2015 1039   LABSPEC 1.016 08/12/2010  [place] 5.5 08/12/2010 1605   GLUCOSEU Negative 06/07/2015 1039   [place] 08/12/2010 1605   BILIRUBINUR Negative 06/07/2015 [place] 08/12/2010 1605   PROTEINUR Negative 06/07/2015 1039   PROTEINUR NEGATIVE 08/12/2010 1605   UROBILINOGEN 0.2 08/12/2010 1605   NITRITE Negative 06/07/2015 1039   NITRITE NEGATIVE 08/12/2010 1605   LEUKOCYTESUR 0-5 06/07/2015 1039      Imaging Results:    Dg Chest Port 1 View  Result Date: 04/04/2016 CLINICAL DATA:  Shortness of breath, generalized weakness, tachycardia. History of hypertension, atrial fibrillation, chronic lung disease on oxygen. EXAM: PORTABLE CHEST 1 VIEW COMPARISON:  Chest radiograph October 30, 2015 FINDINGS: Cardiac silhouette is moderately enlarged and unchanged. Fullness of pulmonary hilum compatible with vascular shadows. Moderate increased interstitial prominence, no pleural effusion. Faint RIGHT upper lobe airspace opacity. No pneumothorax. Single lead LEFT cardiac defibrillator in situ. IMPRESSION: Increasing interstitial prominence suggests COPD and superimposed pulmonary edema or atypical infection. Suspected RIGHT  upper lobe pneumonia. Followup PA and lateral chest X-ray is recommended in 3-4 weeks following trial of antibiotic therapy to ensure resolution and exclude underlying malignancy. Electronically Signed   By: Elon Alas M.D.   On: 04/04/2016 19:46    My personal review of EKG: Rhythm - Atrial fibrillation   Assessment & Plan:    Active Problems:   Permanent atrial fibrillation (HCC)   Hypothyroidism   Chronic hypoxemic respiratory failure ([place])   Long term current use of anticoagulant   Essential hypertension   CAP (community acquired pneumonia)   1. Community acquired pneumonia- chest x-ray showed right upper lobe pneumonia, patient started on ceftriaxone and Zithromax in the ED. Will continue with ceftriaxone and Zithromax. Blood cultures 2. Obtain urinary strep pneumo antigen. 2. Right leg ulcer- patient has chronic lymphedema, was recently seen at wound care clinic and started on Bactrim DS for wound culture positive for Klebsiella as well as MRSA. Currently both the lower extremities are wrapped, will continue with Bactrim. 3. Acute on chronic diastolic CHF- mild CHF exacerbation, 1 dose of Lasix 40 mg IV given in the ED. Will continue with by mouth Lasix 4. Dementia- continue Namenda, Aricept. 5. Acute kidney injury- mild AKA due to Lasix. Patient has been given Lasix in the ED. Follow BMP in a.m. 6. Hypothyroidism-continue Synthroid 7. Atrial fibrillation-heart rate is controlled, continue eliquis for chronic anticoagulation. 8. Hyperlipidemia-continue Zocor   DVT Prophylaxis-   Eliquis  AM Labs Ordered, also please review Full Orders  Family Communication: Admission, patients condition and plan of care including tests being ordered have been discussed with the patient and her daughters at bedside who indicate understanding and agree with the plan and Code Status.  Code Status:  Full code  Admission status: Observation  Time spent in minutes : 60  minutes   Maurice Fotheringham S M.D on 04/04/2016 at 9:37 PM  Between 7am to 7pm - Pager - [phone]. After 7pm go to [email or web address] - password [facility]  [facility] - Office  [phone]

## 2016-04-04 NOTE — ED Notes (Signed)
Patient placed in 2 LPM of oxygen via [place]. Sats increased to 96%.

## 2016-04-04 NOTE — Progress Notes (Signed)
Pharmacy Antibiotic Note  [name] is a 83 y.o. female admitted on 04/04/2016 with pneumonia.  Pharmacy has been consulted for rocephin and azithromycin dosing. No adjustment needed for renal function.  Plan: Cont rocephin 1 gm IV q24 hours Cont azithromycin 500 mg IV q24 hours Pharmacy will sign off.  Height: 4\' 11"  (149.9 cm) Weight: 146 lb (66.2 kg) IBW/kg (Calculated) : 43.2  Temp (24hrs), Avg:98.8 F (37.1 C), Min:98.5 F (36.9 C), Max:99 F (37.2 C)   Recent Labs Lab 04/04/16 1933 04/04/16 1937  WBC 14.1*  --   CREATININE 1.37*  --   LATICACIDVEN  --  1.1    Estimated Creatinine Clearance: 27.1 mL/min (by C-G formula based on SCr of 1.37 mg/dL (H)).    No Known Allergies   Thank you for allowing pharmacy to be a part of this patient's care.  Marley Charlot, [place] 04/04/2016 8:59 PM

## 2016-04-04 NOTE — ED Provider Notes (Signed)
[facility] DEPT Provider Note   CSN: [id number] Arrival date & time: 04/04/16  1904     History   Chief Complaint Chief Complaint  Patient presents with  . Shortness of Breath    HPI [name] is a 83 y.o. female.  The history is provided by the patient, a relative and a caregiver. The history is limited by the condition of the patient (Hx dementia).  Shortness of Breath     Pt was seen at 1910. Per pt and her family, c/o gradual onset and persistence of constant SOB and generalized weakness that began earlier today. Family states pt has been "just laying around" today. Family states they decided to take her vital signs and "her HR was up and her O2 Sat was low." No reported fevers, no vomiting/diarrhea, no CP, no abd pain, no focal motor weakness. Pt has significant hx of dementia and currently denies complaints.    Past Medical History:  Diagnosis Date  . Chronic lung disease    on home O2   . Complete heart block (HCC)    s/p PPM by Dr Rayann Heman  . Debilitated   . Diastolic dysfunction   . Hypertension   . Lymphedema of right lower extremity   . MI (mitral incompetence)   . Obesity   . Permanent atrial fibrillation ([place])   . Pulmonary hypertension   . Rheumatoid arthritis(714.0)   . Sleep apnea   . Stroke [facility])    remote  . Venous insufficiency    with chronic leg ulcers    Patient Active Problem List   Diagnosis Date Noted  . Alzheimer's disease 10/11/2014  . Essential hypertension 10/11/2014  . Cardiomyopathy, dilated ([place]) 07/24/2014  . Apnea, sleep 07/24/2014  . Anemia 04/28/2014  . Chronic hypoxemic respiratory failure ([place]) 04/12/2014  . Chronic ulcer of leg ([place]) 04/12/2014  . Lymphedema of leg 04/12/2014  . Disorder of nutrition 04/12/2014  . Hypothyroidism 03/24/2014  . Stasis dermatitis of both legs 05/06/2013  . Pacemaker-St.[employer]ude 11/25/2011  . Complete heart block ([place]) 12/12/2010  . Permanent atrial fibrillation ([place]) 12/12/2010  .  Diastolic dysfunction 08/65/7846  . Venous insufficiency 12/12/2010  . Long term current use of anticoagulant 09/30/2002    Past Surgical History:  Procedure Laterality Date  . PACEMAKER INSERTION  08/13/10   SJM by Dr Rayann Heman    OB History    No data available       Home Medications    Prior to Admission medications   Medication Sig Start Date End Date Taking? Authorizing Provider  apixaban (ELIQUIS) 5 MG TABS tablet Take 1 tablet (5 mg total) by mouth 2 (two) times daily. Patient taking differently: Take 5 mg by mouth every morning.  12/19/15  Yes Arnoldo Lenis, MD  donepezil (ARICEPT) 10 MG tablet TAKE ONE (1) TABLET AT BEDTIME 03/20/16  Yes [name], MD  furosemide (LASIX) 40 MG tablet TAKE 1 TABLET EVERY MORNING AND TAKE 1/2 TABLET AT LUNCH Patient taking differently: TAKE 1 TABLET EVERY MORNING 01/09/16  Yes [name], MD  levothyroxine (SYNTHROID, LEVOTHROID) 50 MCG tablet TAKE ONE (1) TABLET EACH DAY 08/02/15  Yes [name], MD  memantine (NAMENDA) 10 MG tablet Take 1 tablet (10 mg total) by mouth 2 (two) times daily. 02/22/16  Yes Eustaquio Maize, MD  Multiple Vitamins-Minerals (MULTIVITAMIN WITH MINERALS) tablet Take 1 tablet by mouth daily.   Yes Historical Provider, MD  OXYGEN-HELIUM IN Inhale 4 L into the lungs continuous. 4L  Yes Historical Provider, MD  simvastatin (ZOCOR) 20 MG tablet TAKE ONE (1) TABLET EACH DAY 04/02/16  Yes [name], MD  sulfamethoxazole-trimethoprim (BACTRIM DS,SEPTRA DS) 800-160 MG tablet Take 1 tablet by mouth 2 (two) times daily. 04/01/16  Yes [name], MD  traMADol (ULTRAM) 50 MG tablet Take 1 tablet (50 mg total) by mouth 3 (three) times daily as needed for moderate pain. 06/01/14  Yes [name], MD  hydrocortisone (PROCTOZONE-HC) 2.5 % rectal cream Place 1 application rectally 2 (two) times daily. Patient not taking: Reported on 04/04/2016 12/10/15   Mary-Margaret Hassell Done, FNP    Family History No family history on  file.  Social History Social History  Substance Use Topics  . Smoking status: Never Smoker  . Smokeless tobacco: Never Used  . Alcohol use No     Allergies   Patient has no known allergies.   Review of Systems Review of Systems  Unable to perform ROS: Dementia  Respiratory: Positive for shortness of breath.      Physical Exam Updated Vital Signs BP 129/82   Pulse (!) 43   Temp 98.5 F (36.9 C) (Oral)   Resp (!) 27   Ht 4\' 11"  (1.499 m)   Wt 146 lb (66.2 kg)   SpO2 96%   BMI 29.49 kg/m    Patient Vitals for the past 24 hrs:  BP Temp Temp src Pulse Resp SpO2 Height Weight  04/04/16 2049 - 99 F (37.2 C) Rectal - - - - -  04/04/16 2030 133/92 - - (!) 46 24 96 % - -  04/04/16 2000 129/82 - - (!) 43 (!) 27 96 % - -  04/04/16 1930 132/90 - - (!) 57 24 97 % - -  04/04/16 1917 - - - - - - 4\' 11"  (1.499 m) 146 lb (66.2 kg)  04/04/16 1916 132/79 98.5 F (36.9 C) Oral 106 (!) 30 (!) 80 % - -      Physical Exam 1915: Physical examination:  Nursing notes reviewed; Vital signs and O2 SAT reviewed;  Constitutional: Well developed, Well nourished, Well hydrated, In no acute distress; Head:  Normocephalic, atraumatic; Eyes: EOMI, PERRL, No scleral icterus; ENMT: Mouth and pharynx normal, Mucous membranes moist; Neck: Supple, Full range of motion, No lymphadenopathy; Cardiovascular: Irregular rate and rhythm, No gallop; Respiratory: Breath sounds coarse & equal bilaterally, faint wheeze. No audible wheezing. Speaking sentences, Normal respiratory effort/excursion; Chest: Nontender, Movement normal; Abdomen: Soft, Nontender, Nondistended, Normal bowel sounds; Genitourinary: No CVA tenderness; Extremities: Pulses normal, No tenderness, +2 pedal edema bilat with bilat LE's wrapped in DSD..; Neuro: Awake, alert, confused per hx dementia. No facial droop. Speech clear. Moves all extremities spontaneously and to command without apparent gross focal motor deficits.; Skin: Color normal,  Warm, Dry.   ED Treatments / Results  Labs (all labs ordered are listed, but only abnormal results are displayed)   EKG  EKG Interpretation  Date/Time:  Friday April 04 2016 19:20:09 EST Ventricular Rate:  105 PR Interval:    QRS Duration: 189 QT Interval:  413 QTC Calculation: 546 R Axis:   -70 Text Interpretation:  Atrial fibrillation Left axis deviation Left bundle branch block When compared with ECG of 08/14/2010 pacing spike is present Confirmed by [facility]  MD, Nunzio Cory [phone]) on 04/04/2016 7:29:46 PM       Radiology   Procedures Procedures (including critical care time)  Medications Ordered in ED Medications  furosemide (LASIX) injection 40 mg (not administered)  albuterol (PROVENTIL) (2.5 MG/3ML)  0.083% nebulizer solution 2.5 mg (2.5 mg Nebulization Given 04/04/16 2010)  ipratropium-albuterol (DUONEB) 0.5-2.5 (3) MG/3ML nebulizer solution 3 mL (3 mLs Nebulization Given 04/04/16 2010)     Initial Impression / Assessment and Plan / ED Course  I have reviewed the triage vital signs and the nursing notes.  Pertinent labs & imaging results that were available during my care of the patient were reviewed by me and considered in my medical decision making (see chart for details).  MDM Reviewed: previous chart, nursing note and vitals Reviewed previous: labs and ECG Interpretation: labs, ECG and x-ray   Results for orders placed or performed during the hospital encounter of 04/04/16  Comprehensive metabolic panel  Result Value Ref Range   Sodium 143 135 - 145 mmol/L   Potassium 4.4 3.5 - 5.1 mmol/L   Chloride 103 101 - 111 mmol/L   CO2 29 22 - 32 mmol/L   Glucose, Bld 133 (H) 65 - 99 mg/dL   BUN 25 (H) 6 - 20 mg/dL   Creatinine, Ser 1.37 (H) 0.44 - 1.00 mg/dL   Calcium 9.4 8.9 - 10.3 mg/dL   Total Protein 7.2 6.5 - 8.1 g/dL   Albumin 3.4 (L) 3.5 - 5.0 g/dL   AST 21 15 - 41 U/L   ALT 12 (L) 14 - 54 U/L   Alkaline Phosphatase 57 38 - 126 U/L   Total Bilirubin 0.5  0.3 - 1.2 mg/dL   GFR calc non Af Amer 35 (L) >60 mL/min   GFR calc Af Amer 41 (L) >60 mL/min   Anion gap 11 5 - 15  Brain natriuretic peptide  Result Value Ref Range   B Natriuretic Peptide 970.0 (H) 0.0 - 100.0 pg/mL  Troponin I  Result Value Ref Range   Troponin I 0.03 (HH) <0.03 ng/mL  Lactic acid, plasma  Result Value Ref Range   Lactic Acid, Venous 1.1 0.5 - 1.9 mmol/L  CBC with Differential  Result Value Ref Range   WBC 14.1 (H) 4.0 - 10.5 K/uL   RBC 3.39 (L) 3.87 - 5.11 MIL/uL   Hemoglobin 9.8 (L) 12.0 - 15.0 g/dL   HCT 31.2 (L) 36.0 - 46.0 %   MCV 92.0 78.0 - 100.0 fL   MCH 28.9 26.0 - 34.0 pg   MCHC 31.4 30.0 - 36.0 g/dL   RDW 14.4 11.5 - 15.5 %   Platelets 359 150 - 400 K/uL   Neutrophils Relative % 85 %   Neutro Abs 11.9 (H) 1.7 - 7.7 K/uL   Lymphocytes Relative 10 %   Lymphs Abs 1.4 0.7 - 4.0 K/uL   Monocytes Relative 5 %   Monocytes Absolute 0.7 0.1 - 1.0 K/uL   Eosinophils Relative 0 %   Eosinophils Absolute 0.0 0.0 - 0.7 K/uL   Basophils Relative 0 %   Basophils Absolute 0.0 0.0 - 0.1 K/uL   Dg Chest Port 1 View Result Date: 04/04/2016 CLINICAL DATA:  Shortness of breath, generalized weakness, tachycardia. History of hypertension, atrial fibrillation, chronic lung disease on oxygen. EXAM: PORTABLE CHEST 1 VIEW COMPARISON:  Chest radiograph October 30, 2015 FINDINGS: Cardiac silhouette is moderately enlarged and unchanged. Fullness of pulmonary hilum compatible with vascular shadows. Moderate increased interstitial prominence, no pleural effusion. Faint RIGHT upper lobe airspace opacity. No pneumothorax. Single lead LEFT cardiac defibrillator in situ. IMPRESSION: Increasing interstitial prominence suggests COPD and superimposed pulmonary edema or atypical infection. Suspected RIGHT upper lobe pneumonia. Followup PA and lateral chest X-ray is recommended in 3-4  weeks following trial of antibiotic therapy to ensure resolution and exclude underlying malignancy.  Electronically Signed   By: Elon Alas M.D.   On: 04/04/2016 19:46    2110:  BNP elevated with pulmonary edema on CXR; will dose IV lasix. Short neb given on arrival for O2 Sats 80% R/A with improvement to 97% on O2 2L N/C. IV abx started for CAP. Dx and testing d/w pt and family.  Questions answered.  Verb understanding, agreeable to admit.  T/C to [facility] Dr. Darrick Meigs, case discussed, including:  HPI, pertinent PM/SHx, VS/PE, dx testing, ED course and treatment:  Agreeable to admit.      Final Clinical Impressions(s) / ED Diagnoses   Final diagnoses:  Community acquired pneumonia, unspecified laterality  Hypoxia    New Prescriptions New Prescriptions   No medications on file      Francine Graven, DO 04/09/16 7026

## 2016-04-04 NOTE — ED Triage Notes (Signed)
Shortness of breath, generalized weakness. Family also reports of high heart rate.

## 2016-04-04 NOTE — ED Notes (Signed)
CRITICAL VALUE ALERT  Critical value received:  Troponin 0.03  Date of notification:  04/04/2016  Time of notification:  2045  Critical value read back:Yes.    Nurse who received alert:  Charlies Silvers RN  MD notified (1st page):  Thurnell Garbe  Time of first page:  2045  MD notified (2nd page):  Time of second page:  Responding MD:  Thurnell Garbe  Time MD responded:  2045

## 2016-04-05 DIAGNOSIS — J189 Pneumonia, unspecified organism: Secondary | ICD-10-CM | POA: Diagnosis not present

## 2016-04-05 DIAGNOSIS — I10 Essential (primary) hypertension: Secondary | ICD-10-CM

## 2016-04-05 DIAGNOSIS — I482 Chronic atrial fibrillation: Secondary | ICD-10-CM

## 2016-04-05 DIAGNOSIS — I110 Hypertensive heart disease with heart failure: Secondary | ICD-10-CM | POA: Diagnosis not present

## 2016-04-05 DIAGNOSIS — I872 Venous insufficiency (chronic) (peripheral): Secondary | ICD-10-CM | POA: Diagnosis not present

## 2016-04-05 DIAGNOSIS — E669 Obesity, unspecified: Secondary | ICD-10-CM | POA: Diagnosis not present

## 2016-04-05 DIAGNOSIS — Z7901 Long term (current) use of anticoagulants: Secondary | ICD-10-CM

## 2016-04-05 DIAGNOSIS — N179 Acute kidney failure, unspecified: Secondary | ICD-10-CM | POA: Diagnosis not present

## 2016-04-05 DIAGNOSIS — J181 Lobar pneumonia, unspecified organism: Secondary | ICD-10-CM | POA: Diagnosis not present

## 2016-04-05 DIAGNOSIS — L97919 Non-pressure chronic ulcer of unspecified part of right lower leg with unspecified severity: Secondary | ICD-10-CM | POA: Diagnosis not present

## 2016-04-05 DIAGNOSIS — R0602 Shortness of breath: Secondary | ICD-10-CM | POA: Diagnosis not present

## 2016-04-05 DIAGNOSIS — Z950 Presence of cardiac pacemaker: Secondary | ICD-10-CM | POA: Diagnosis not present

## 2016-04-05 DIAGNOSIS — B9689 Other specified bacterial agents as the cause of diseases classified elsewhere: Secondary | ICD-10-CM | POA: Diagnosis not present

## 2016-04-05 DIAGNOSIS — Z9981 Dependence on supplemental oxygen: Secondary | ICD-10-CM | POA: Diagnosis not present

## 2016-04-05 DIAGNOSIS — I5033 Acute on chronic diastolic (congestive) heart failure: Secondary | ICD-10-CM | POA: Diagnosis not present

## 2016-04-05 DIAGNOSIS — J984 Other disorders of lung: Secondary | ICD-10-CM | POA: Diagnosis not present

## 2016-04-05 DIAGNOSIS — E785 Hyperlipidemia, unspecified: Secondary | ICD-10-CM | POA: Diagnosis not present

## 2016-04-05 DIAGNOSIS — E039 Hypothyroidism, unspecified: Secondary | ICD-10-CM | POA: Diagnosis not present

## 2016-04-05 DIAGNOSIS — J9611 Chronic respiratory failure with hypoxia: Secondary | ICD-10-CM | POA: Diagnosis not present

## 2016-04-05 DIAGNOSIS — Z6829 Body mass index (BMI) 29.0-29.9, adult: Secondary | ICD-10-CM | POA: Diagnosis not present

## 2016-04-05 DIAGNOSIS — I340 Nonrheumatic mitral (valve) insufficiency: Secondary | ICD-10-CM | POA: Diagnosis not present

## 2016-04-05 DIAGNOSIS — F0390 Unspecified dementia without behavioral disturbance: Secondary | ICD-10-CM | POA: Diagnosis not present

## 2016-04-05 DIAGNOSIS — I447 Left bundle-branch block, unspecified: Secondary | ICD-10-CM | POA: Diagnosis not present

## 2016-04-05 DIAGNOSIS — Z8673 Personal history of transient ischemic attack (TIA), and cerebral infarction without residual deficits: Secondary | ICD-10-CM | POA: Diagnosis not present

## 2016-04-05 DIAGNOSIS — B9561 Methicillin susceptible Staphylococcus aureus infection as the cause of diseases classified elsewhere: Secondary | ICD-10-CM | POA: Diagnosis not present

## 2016-04-05 DIAGNOSIS — T501X5A Adverse effect of loop [high-ceiling] diuretics, initial encounter: Secondary | ICD-10-CM | POA: Diagnosis not present

## 2016-04-05 DIAGNOSIS — I890 Lymphedema, not elsewhere classified: Secondary | ICD-10-CM | POA: Diagnosis not present

## 2016-04-05 DIAGNOSIS — B961 Klebsiella pneumoniae [K. pneumoniae] as the cause of diseases classified elsewhere: Secondary | ICD-10-CM | POA: Diagnosis not present

## 2016-04-05 LAB — COMPREHENSIVE METABOLIC PANEL
ALK PHOS: 51 U/L (ref 38–126)
ALT: 8 U/L — AB (ref 14–54)
ANION GAP: 8 (ref 5–15)
AST: 17 U/L (ref 15–41)
Albumin: 3.1 g/dL — ABNORMAL LOW (ref 3.5–5.0)
BILIRUBIN TOTAL: 0.5 mg/dL (ref 0.3–1.2)
BUN: 25 mg/dL — ABNORMAL HIGH (ref 6–20)
CALCIUM: 8.6 mg/dL — AB (ref 8.9–10.3)
CO2: 30 mmol/L (ref 22–32)
CREATININE: 1.43 mg/dL — AB (ref 0.44–1.00)
Chloride: 103 mmol/L (ref 101–111)
GFR calc non Af Amer: 34 mL/min — ABNORMAL LOW (ref >60)
GFR, EST AFRICAN AMERICAN: 39 mL/min — AB (ref >60)
Glucose, Bld: 90 mg/dL (ref 65–99)
Potassium: 4.3 mmol/L (ref 3.5–5.1)
SODIUM: 141 mmol/L (ref 135–145)
TOTAL PROTEIN: 6.3 g/dL — AB (ref 6.5–8.1)

## 2016-04-05 LAB — CBC
HEMATOCRIT: 29.3 % — AB (ref 36.0–46.0)
HEMOGLOBIN: 9.3 g/dL — AB (ref 12.0–15.0)
MCH: 29.6 pg (ref 26.0–34.0)
MCHC: 31.7 g/dL (ref 30.0–36.0)
MCV: 93.3 fL (ref 78.0–100.0)
Platelets: 322 10*3/uL (ref 150–400)
RBC: 3.14 MIL/uL — AB (ref 3.87–5.11)
RDW: 14.5 % (ref 11.5–15.5)
WBC: 11 10*3/uL — ABNORMAL HIGH (ref 4.0–10.5)

## 2016-04-05 NOTE — Progress Notes (Signed)
PROGRESS NOTE    [name]  JKK:[id number] DOB: 1933-03-15 DOA: 04/04/2016 PCP: [name], MD   Brief Narrative:  [name]  is a 83 y.o. female, With history of lymphedema of lower extremities, chronic ulcer of right leg, who was seen at wound care clinic yesterday. Wound culture revealed staph aureus and Klebsiella, patient started on Bactrim. She also has history of atrial fibrillation, status post permanent pacemaker placement, chronic anticoagulation with eliquis, chronic diastolic CHF.  This morning patient has been very lethargic, felt nausea but no vomiting. She also has been coughing up yellow colored phlegm. Also complains of worsening shortness of breath. Denies any chest pain. No vomiting or diarrhea. She does have history of chronic loose stools. At home patient's oxygen saturation was low and heart rate was up so family brought her to hospital for further evaluation.  In the ED chest x-ray showed pulmonary vascular congestion and suspected right upper lobe pneumonia. Lab work showed WBC 14.1, BUN 25, creatinine 1.37, lactate 1.1   Assessment & Plan:   Active Problems:   Permanent atrial fibrillation (HCC)   Hypothyroidism   Chronic hypoxemic respiratory failure (HCC)   Long term current use of anticoagulant   Essential hypertension   CAP (community acquired pneumonia)  Community acquired pneumonia - chest x-ray showed right upper lobe pneumonia - patient started on ceftriaxone and Zithromax in the ED - continue with ceftriaxone and Zithromax - Blood cultures 2 - Obtain urinary strep pneumo antigen. - will need follow up XRay in 3-4 weeks to ensure resolution of infiltrate  Right leg ulcer - patient has chronic lymphedema - was recently seen at wound care clinic and started on Bactrim DS for wound culture positive for Klebsiella as well as MRSA - Continue with Bactrim - wound care per nursing staff  Acute on chronic diastolic CHF - mild CHF  exacerbation, 1 dose of Lasix 40 mg IV given in the ED - Will discontinue PO Lasix due to AKI  Dementia - continue Namenda, Aricept.  Acute kidney injury - mild AKI due to Lasix - Cr slightly worse this am - repeat BMP in am   Hypothyroidism - continue Synthroid  Atrial fibrillation - heart rate is controlled - continue eliquis for chronic anticoagulation.  Hyperlipidemia  - continue Zocor   DVT prophylaxis: Eliquis Code Status: Full code Family Communication: two daughters are bedside Disposition Plan: likely discharge in 24-48 hours pending improvement   Consultants:   none  Procedures:   none  Antimicrobials:   Ceftriaxone  zithromax    Subjective: Patient leg dressings unwrapped with RN.  Per daughters that are bedside her wounds look better today.  Patient denies pain.  States that she still feels a little congested.  Family concerned about congestive heart failure.  Objective: Vitals:   04/04/16 2230 04/04/16 2338 04/05/16 0647 04/05/16 1318  BP: 118/57 121/75 112/79 128/81  Pulse: 80 69 92 89  Resp: 20  18 18   Temp:  98.4 F (36.9 C) 98.7 F (37.1 C) 98.9 F (37.2 C)  TempSrc:  Oral Oral Oral  SpO2: 99% 100% 100% 100%  Weight:  66.2 kg (146 lb)    Height:  4\' 11"  (1.499 m)      Intake/Output Summary (Last 24 hours) at 04/05/16 1535 Last data filed at 04/05/16 1039  Gross per 24 hour  Intake              540 ml  Output  0 ml  Net              540 ml   Filed Weights   04/04/16 1917 04/04/16 2338  Weight: 66.2 kg (146 lb) 66.2 kg (146 lb)    Examination:  General exam: Appears calm and comfortable  Respiratory system: Respiratory effort normal.  Expiratory wheezes with crackles in right mid lung and scattered rhonchi bilaterally Cardiovascular system: S1 & S2 heard, irregular rhythm. No JVD, murmurs, rubs, gallops or clicks. No pedal edema. Gastrointestinal system: Abdomen is nondistended, soft and nontender. No  organomegaly or masses felt. Normal bowel sounds heard. Central nervous system: Alert and oriented. No focal neurological deficits. Extremities: Symmetric 5 x 5 power. Skin: right lower leg ulcer that wraps around most of the leg that does not have drainage or discharge but is covered with honey, left lower extremity with small 4cm x 5cm rectangle of honey on the lateral distal leg.  Chronic venous stasis changes of lower extremities bilaterally Psychiatry: Mood & affect appropriate.     Data Reviewed: I have personally reviewed following labs and imaging studies  CBC:  Recent Labs Lab 04/04/16 1933 04/05/16 0604  WBC 14.1* 11.0*  NEUTROABS 11.9*  --   HGB 9.8* 9.3*  HCT 31.2* 29.3*  MCV 92.0 93.3  PLT 359 366   Basic Metabolic Panel:  Recent Labs Lab 04/04/16 1933 04/05/16 0604  NA 143 141  K 4.4 4.3  CL 103 103  CO2 29 30  GLUCOSE 133* 90  BUN 25* 25*  CREATININE 1.37* 1.43*  CALCIUM 9.4 8.6*   GFR: Estimated Creatinine Clearance: 26 mL/min (by C-G formula based on SCr of 1.43 mg/dL (H)). Liver Function Tests:  Recent Labs Lab 04/04/16 1933 04/05/16 0604  AST 21 17  ALT 12* 8*  ALKPHOS 57 51  BILITOT 0.5 0.5  PROT 7.2 6.3*  ALBUMIN 3.4* 3.1*   No results for input(s): LIPASE, AMYLASE in the last 168 hours. No results for input(s): AMMONIA in the last 168 hours. Coagulation Profile: No results for input(s): INR, PROTIME in the last 168 hours. Cardiac Enzymes:  Recent Labs Lab 04/04/16 1933  TROPONINI 0.03*   BNP (last 3 results) No results for input(s): PROBNP in the last 8760 hours. HbA1C: No results for input(s): HGBA1C in the last 72 hours. CBG: No results for input(s): GLUCAP in the last 168 hours. Lipid Profile: No results for input(s): CHOL, HDL, LDLCALC, TRIG, CHOLHDL, LDLDIRECT in the last 72 hours. Thyroid Function Tests: No results for input(s): TSH, T4TOTAL, FREET4, T3FREE, THYROIDAB in the last 72 hours. Anemia Panel: No results  for input(s): VITAMINB12, FOLATE, FERRITIN, TIBC, IRON, RETICCTPCT in the last 72 hours. Sepsis Labs:  Recent Labs Lab 04/04/16 1937  LATICACIDVEN 1.1    Recent Results (from the past 240 hour(s))  Aerobic Culture (superficial specimen)     Status: None   Collection Time: 03/27/16  4:00 PM  Result Value Ref Range Status   Specimen Description BUTTOCKS  Final   Special Requests NONE  Final   Gram Stain   Final    RARE WBC PRESENT,BOTH PMN AND MONONUCLEAR FEW GRAM POSITIVE COCCI IN PAIRS Performed at [facility], [address] [place], [place] [address]    Culture   Final    MODERATE STAPHYLOCOCCUS AUREUS FEW KLEBSIELLA OXYTOCA    Report Status 03/30/2016 FINAL  Final   Organism ID, Bacteria STAPHYLOCOCCUS AUREUS  Final   Organism ID, Bacteria KLEBSIELLA OXYTOCA  Final  Susceptibility   Klebsiella oxytoca - MIC*    AMPICILLIN >=32 RESISTANT Resistant     CEFAZOLIN >=64 RESISTANT Resistant     CEFEPIME <=1 SENSITIVE Sensitive     CEFTAZIDIME <=1 SENSITIVE Sensitive     CEFTRIAXONE <=1 SENSITIVE Sensitive     CIPROFLOXACIN <=0.25 SENSITIVE Sensitive     GENTAMICIN <=1 SENSITIVE Sensitive     IMIPENEM <=0.25 SENSITIVE Sensitive     TRIMETH/SULFA <=20 SENSITIVE Sensitive     AMPICILLIN/SULBACTAM 16 INTERMEDIATE Intermediate     PIP/TAZO <=4 SENSITIVE Sensitive     Extended ESBL NEGATIVE Sensitive     * FEW KLEBSIELLA OXYTOCA   Staphylococcus aureus - MIC*    CIPROFLOXACIN >=8 RESISTANT Resistant     ERYTHROMYCIN >=8 RESISTANT Resistant     GENTAMICIN <=0.5 SENSITIVE Sensitive     OXACILLIN >=4 RESISTANT Resistant     TETRACYCLINE <=1 SENSITIVE Sensitive     VANCOMYCIN <=0.5 SENSITIVE Sensitive     TRIMETH/SULFA <=10 SENSITIVE Sensitive     CLINDAMYCIN <=0.25 SENSITIVE Sensitive     RIFAMPIN <=0.5 SENSITIVE Sensitive     Inducible Clindamycin NEGATIVE Sensitive     * MODERATE STAPHYLOCOCCUS AUREUS  Culture, blood (Routine X 2) w Reflex to ID Panel      Status: None (Preliminary result)   Collection Time: 04/04/16 10:13 PM  Result Value Ref Range Status   Specimen Description BLOOD LEFT ARM  Final   Special Requests BOTTLES DRAWN AEROBIC AND ANAEROBIC 6CC  Final   Culture PENDING  Incomplete   Report Status PENDING  Incomplete  Culture, blood (Routine X 2) w Reflex to ID Panel     Status: None (Preliminary result)   Collection Time: 04/04/16 10:22 PM  Result Value Ref Range Status   Specimen Description BLOOD LEFT HAND  Final   Special Requests BOTTLES DRAWN AEROBIC AND ANAEROBIC [facility]  Final   Culture PENDING  Incomplete   Report Status PENDING  Incomplete         Radiology Studies: Dg Chest Port 1 View  Result Date: 04/04/2016 CLINICAL DATA:  Shortness of breath, generalized weakness, tachycardia. History of hypertension, atrial fibrillation, chronic lung disease on oxygen. EXAM: PORTABLE CHEST 1 VIEW COMPARISON:  Chest radiograph October 30, 2015 FINDINGS: Cardiac silhouette is moderately enlarged and unchanged. Fullness of pulmonary hilum compatible with vascular shadows. Moderate increased interstitial prominence, no pleural effusion. Faint RIGHT upper lobe airspace opacity. No pneumothorax. Single lead LEFT cardiac defibrillator in situ. IMPRESSION: Increasing interstitial prominence suggests COPD and superimposed pulmonary edema or atypical infection. Suspected RIGHT upper lobe pneumonia. Followup PA and lateral chest X-ray is recommended in 3-4 weeks following trial of antibiotic therapy to ensure resolution and exclude underlying malignancy. Electronically Signed   By: Elon Alas M.D.   On: 04/04/2016 19:46        Scheduled Meds: . apixaban  5 mg Oral BID  . azithromycin  500 mg Intravenous Q24H  . cefTRIAXone (ROCEPHIN)  IV  1 g Intravenous Q24H  . donepezil  10 mg Oral QHS  . furosemide  40 mg Oral q morning - 10a  . levothyroxine  50 mcg Oral QAC breakfast  . memantine  10 mg Oral BID  . simvastatin  20 mg Oral  q1800  . sulfamethoxazole-trimethoprim  1 tablet Oral BID   Continuous Infusions:   LOS: 0 days    Time spent: 30 minutes    Loretha Stapler, MD [facility] Pager [phone]  If 7PM-7AM, please contact night-coverage  [email or web address] Password [email or web address] 04/05/2016, 3:35 PM

## 2016-04-06 LAB — URINE CULTURE: Culture: NO GROWTH

## 2016-04-06 MED ORDER — CEFUROXIME AXETIL 500 MG PO TABS: 500.0000 mg | ORAL_TABLET | Freq: Two times a day (BID) | ORAL | 0 refills | Status: AC

## 2016-04-06 MED ORDER — AZITHROMYCIN 500 MG PO TABS: 500.0000 mg | ORAL_TABLET | Freq: Every day | ORAL | 0 refills | Status: AC

## 2016-04-06 NOTE — Discharge Summary (Signed)
Physician Discharge Summary  [name]:[id number] DOB: 02/24/1933 DOA: 04/04/2016  PCP: [name], MD  Admit date: 04/04/2016 Discharge date: 04/06/2016  Admitted From: Home Disposition:   Home  Recommendations for Outpatient Follow-up:  Follow up with PCP in 1-2 weeks Get chest xray repeated in 3-4 weeks Get BMP in 1 week See PCP in 1-2 weeks Follow up with wound care per previously scheduled appointment Follow up with cardiology at previously discussed time  Home Health: No Equipment/Devices: None- patient already set up for 4L [place] oxygen   Discharge Condition: Stable CODE STATUS: Full Code Diet recommendation: Heart Healthy  Brief/Interim Summary: GaleRichardsonis a 83 y.o.female,With history of lymphedema of lower extremities, chronic ulcer of right leg, who was seen at wound care clinic yesterday. Wound culture revealed staph aureus and Klebsiella,patient started on Bactrim. She also has history of atrial fibrillation, status post permanent pacemaker placement, chronic anticoagulation with eliquis, chronic diastolic CHF.  This morning patient has been very lethargic, felt nausea but no vomiting. She also has been coughing up yellow colored phlegm. Also complains of worsening shortness of breath. Denies any chest pain. No vomiting or diarrhea. She does have history of chronic loose stools.At home patient's oxygen saturation was low and heart rate was up so family brought her to hospital for further evaluation.  In the ED chest x-ray showed pulmonary vascular congestion and suspected right upper lobe pneumonia. Lab work showed WBC 14.1, BUN 25, creatinine 1.37, lactate 1.1  Patient was given IV antibiotics and improved.  On day of discharge she had improved and was on home oxygen requirement.  She was to transition to PO antibiotics at discharge.  She was given discharge instructions for follow up.  Discharge Diagnoses:  Active Problems:   Permanent atrial  fibrillation (HCC)   Hypothyroidism   Chronic hypoxemic respiratory failure (HCC)   Long term current use of anticoagulant   Essential hypertension   CAP (community acquired pneumonia)  Community acquired pneumonia - chest x-ray showed right upper lobe pneumonia - patient started on ceftriaxone and Zithromax in the ED - continue with ceftriaxone and Zithromax - Blood cultures 2 negative for 2 days - urinary strep pneumo antigen pending - will need follow up XRay in 3-4 weeks to ensure resolution of infiltrate  Right leg ulcer - patient has chronic lymphedema -was recently seen at wound care clinic and started on Bactrim DS for wound culture positive for Klebsiella as well as MRSA - Continue with Bactrim - wound care per nursing staff  Acute on chronic diastolic CHF - mild CHF exacerbation, 1 dose of Lasix 40 mg IV given in the ED  Dementia - continue Namenda, Aricept.  Acute kidney injury - mild AKI due to Lasix - told to restart lasix after discharge  Hypothyroidism - continue Synthroid  Atrial fibrillation - heart rate is controlled - continue eliquis for chronic anticoagulation.  Hyperlipidemia  - continue Zocor  Discharge Instructions  Discharge Instructions    Call MD for:  difficulty breathing, headache or visual disturbances    Complete by:  As directed    Call MD for:  extreme fatigue    Complete by:  As directed    Call MD for:  hives    Complete by:  As directed    Call MD for:  persistant dizziness or light-headedness    Complete by:  As directed    Call MD for:  persistant nausea and vomiting    Complete by:  As directed  Call MD for:  severe uncontrolled pain    Complete by:  As directed    Call MD for:  temperature >100.4    Complete by:  As directed    Diet - low sodium heart healthy    Complete by:  As directed    Discharge instructions    Complete by:  As directed    Get chest xray repeated in 3-4 weeks Get BMP in 1 week See  PCP in 1-2 weeks Follow up with wound care per previously scheduled appointment Follow up with cardiology at previously discussed time   Increase activity slowly    Complete by:  As directed      Allergies as of 04/06/2016   No Known Allergies     Medication List    TAKE these medications   apixaban 5 MG Tabs tablet Commonly known as:  ELIQUIS Take 1 tablet (5 mg total) by mouth 2 (two) times daily. What changed:  when to take this   azithromycin 500 MG tablet Commonly known as:  ZITHROMAX Take 1 tablet (500 mg total) by mouth daily.   cefUROXime 500 MG tablet Commonly known as:  CEFTIN Take 1 tablet (500 mg total) by mouth 2 (two) times daily with a meal.   donepezil 10 MG tablet Commonly known as:  ARICEPT TAKE ONE (1) TABLET AT BEDTIME   furosemide 40 MG tablet Commonly known as:  LASIX TAKE 1 TABLET EVERY MORNING AND TAKE 1/2 TABLET AT LUNCH What changed:  See the new instructions.   hydrocortisone 2.5 % rectal cream Commonly known as:  PROCTOZONE-HC Place 1 application rectally 2 (two) times daily.   levothyroxine 50 MCG tablet Commonly known as:  SYNTHROID, LEVOTHROID TAKE ONE (1) TABLET EACH DAY   memantine 10 MG tablet Commonly known as:  NAMENDA Take 1 tablet (10 mg total) by mouth 2 (two) times daily.   multivitamin with minerals tablet Take 1 tablet by mouth daily.   OXYGEN Inhale 4 L into the lungs continuous. 4L   simvastatin 20 MG tablet Commonly known as:  ZOCOR TAKE ONE (1) TABLET EACH DAY   sulfamethoxazole-trimethoprim 800-160 MG tablet Commonly known as:  BACTRIM DS,SEPTRA DS Take 1 tablet by mouth 2 (two) times daily.   traMADol 50 MG tablet Commonly known as:  ULTRAM Take 1 tablet (50 mg total) by mouth 3 (three) times daily as needed for moderate pain.       No Known Allergies  Consultations:  None   Procedures/Studies: Dg Chest Port 1 View  Result Date: 04/04/2016 CLINICAL DATA:  Shortness of breath, generalized  weakness, tachycardia. History of hypertension, atrial fibrillation, chronic lung disease on oxygen. EXAM: PORTABLE CHEST 1 VIEW COMPARISON:  Chest radiograph October 30, 2015 FINDINGS: Cardiac silhouette is moderately enlarged and unchanged. Fullness of pulmonary hilum compatible with vascular shadows. Moderate increased interstitial prominence, no pleural effusion. Faint RIGHT upper lobe airspace opacity. No pneumothorax. Single lead LEFT cardiac defibrillator in situ. IMPRESSION: Increasing interstitial prominence suggests COPD and superimposed pulmonary edema or atypical infection. Suspected RIGHT upper lobe pneumonia. Followup PA and lateral chest X-ray is recommended in 3-4 weeks following trial of antibiotic therapy to ensure resolution and exclude underlying malignancy. Electronically Signed   By: Elon Alas M.D.   On: 04/04/2016 19:46      Subjective: Patient seen after ambulating to the bathroom.  Reports feeling better overall.  Mentions she feels she is ready to go home.  Daughters are bedside and reports improvement in patient  since admission.  Discussed medication at time of discharge as well as plans for follow up.  Discharge Exam: Vitals:   04/05/16 2215 04/06/16 0559  BP: (!) 108/47 121/82  Pulse: 84 (!) 119  Resp: 18 18  Temp: 97.7 F (36.5 C) 98.4 F (36.9 C)   Vitals:   04/05/16 0647 04/05/16 1318 04/05/16 2215 04/06/16 0559  BP: 112/79 128/81 (!) 108/47 121/82  Pulse: 92 89 84 (!) 119  Resp: 18 18 18 18   Temp: 98.7 F (37.1 C) 98.9 F (37.2 C) 97.7 F (36.5 C) 98.4 F (36.9 C)  TempSrc: Oral Oral Oral Oral  SpO2: 100% 100% 100% 100%  Weight:      Height:        General: Pt is alert, awake, not in acute distress Cardiovascular: irregularly irregular rhythmn, S1/S2 +, no rubs, no gallops Respiratory: CTA bilaterally, no wheezing, no rhonchi, nasal cannula in place Abdominal: Soft, NT, ND, bowel sounds + Extremities: no edema, no cyanosis, lower  extremities wrapped in dressings bilaterally from below knee to proximal to toes bilaterally    The results of significant diagnostics from this hospitalization (including imaging, microbiology, ancillary and laboratory) are listed below for reference.     Microbiology: Recent Results (from the past 240 hour(s))  Aerobic Culture (superficial specimen)     Status: None   Collection Time: 03/27/16  4:00 PM  Result Value Ref Range Status   Specimen Description BUTTOCKS  Final   Special Requests NONE  Final   Gram Stain   Final    RARE WBC PRESENT,BOTH PMN AND MONONUCLEAR FEW GRAM POSITIVE COCCI IN PAIRS Performed at [facility], [address] [place], [place] [address]    Culture   Final    MODERATE STAPHYLOCOCCUS AUREUS FEW KLEBSIELLA OXYTOCA    Report Status 03/30/2016 FINAL  Final   Organism ID, Bacteria STAPHYLOCOCCUS AUREUS  Final   Organism ID, Bacteria KLEBSIELLA OXYTOCA  Final      Susceptibility   Klebsiella oxytoca - MIC*    AMPICILLIN >=32 RESISTANT Resistant     CEFAZOLIN >=64 RESISTANT Resistant     CEFEPIME <=1 SENSITIVE Sensitive     CEFTAZIDIME <=1 SENSITIVE Sensitive     CEFTRIAXONE <=1 SENSITIVE Sensitive     CIPROFLOXACIN <=0.25 SENSITIVE Sensitive     GENTAMICIN <=1 SENSITIVE Sensitive     IMIPENEM <=0.25 SENSITIVE Sensitive     TRIMETH/SULFA <=20 SENSITIVE Sensitive     AMPICILLIN/SULBACTAM 16 INTERMEDIATE Intermediate     PIP/TAZO <=4 SENSITIVE Sensitive     Extended ESBL NEGATIVE Sensitive     * FEW KLEBSIELLA OXYTOCA   Staphylococcus aureus - MIC*    CIPROFLOXACIN >=8 RESISTANT Resistant     ERYTHROMYCIN >=8 RESISTANT Resistant     GENTAMICIN <=0.5 SENSITIVE Sensitive     OXACILLIN >=4 RESISTANT Resistant     TETRACYCLINE <=1 SENSITIVE Sensitive     VANCOMYCIN <=0.5 SENSITIVE Sensitive     TRIMETH/SULFA <=10 SENSITIVE Sensitive     CLINDAMYCIN <=0.25 SENSITIVE Sensitive     RIFAMPIN <=0.5 SENSITIVE Sensitive     Inducible Clindamycin  NEGATIVE Sensitive     * MODERATE STAPHYLOCOCCUS AUREUS  Urine culture     Status: None   Collection Time: 04/04/16  8:50 PM  Result Value Ref Range Status   Specimen Description URINE, CATHETERIZED  Final   Special Requests NONE  Final   Culture   Final    NO GROWTH Performed at [facility],  [address], [place], [place] [address]    Report Status 04/06/2016 FINAL  Final  Culture, blood (Routine X 2) w Reflex to ID Panel     Status: None (Preliminary result)   Collection Time: 04/04/16 10:13 PM  Result Value Ref Range Status   Specimen Description BLOOD LEFT ARM  Final   Special Requests BOTTLES DRAWN AEROBIC AND ANAEROBIC 6CC  Final   Culture NO GROWTH 2 DAYS  Final   Report Status PENDING  Incomplete  Culture, blood (Routine X 2) w Reflex to ID Panel     Status: None (Preliminary result)   Collection Time: 04/04/16 10:22 PM  Result Value Ref Range Status   Specimen Description BLOOD LEFT HAND  Final   Special Requests BOTTLES DRAWN AEROBIC AND ANAEROBIC 6CC  Final   Culture NO GROWTH 2 DAYS  Final   Report Status PENDING  Incomplete     Labs: BNP (last 3 results)  Recent Labs  04/04/16 1933  BNP 326.7*   Basic Metabolic Panel:  Recent Labs Lab 04/04/16 1933 04/05/16 0604  NA 143 141  K 4.4 4.3  CL 103 103  CO2 29 30  GLUCOSE 133* 90  BUN 25* 25*  CREATININE 1.37* 1.43*  CALCIUM 9.4 8.6*   Liver Function Tests:  Recent Labs Lab 04/04/16 1933 04/05/16 0604  AST 21 17  ALT 12* 8*  ALKPHOS 57 51  BILITOT 0.5 0.5  PROT 7.2 6.3*  ALBUMIN 3.4* 3.1*   No results for input(s): LIPASE, AMYLASE in the last 168 hours. No results for input(s): AMMONIA in the last 168 hours. CBC:  Recent Labs Lab 04/04/16 1933 04/05/16 0604  WBC 14.1* 11.0*  NEUTROABS 11.9*  --   HGB 9.8* 9.3*  HCT 31.2* 29.3*  MCV 92.0 93.3  PLT 359 322   Cardiac Enzymes:  Recent Labs Lab 04/04/16 1933  TROPONINI 0.03*   BNP: Invalid input(s): POCBNP CBG: No  results for input(s): GLUCAP in the last 168 hours. D-Dimer No results for input(s): DDIMER in the last 72 hours. Hgb A1c No results for input(s): HGBA1C in the last 72 hours. Lipid Profile No results for input(s): CHOL, HDL, LDLCALC, TRIG, CHOLHDL, LDLDIRECT in the last 72 hours. Thyroid function studies No results for input(s): TSH, T4TOTAL, T3FREE, THYROIDAB in the last 72 hours.  Invalid input(s): FREET3 Anemia work up No results for input(s): VITAMINB12, FOLATE, FERRITIN, TIBC, IRON, RETICCTPCT in the last 72 hours. Urinalysis    Component Value Date/Time   COLORURINE YELLOW 04/04/2016 2050   APPEARANCEUR CLEAR 04/04/2016 2050   APPEARANCEUR Clear 06/07/2015 1039   LABSPEC 1.019 04/04/2016 2050   PHURINE 6.0 04/04/2016 2050   GLUCOSEU NEGATIVE 04/04/2016 2050   HGBUR MODERATE (A) 04/04/2016 2050   [place] NEGATIVE 04/04/2016 2050   BILIRUBINUR Negative 06/07/2015 1039   [place] 04/04/2016 2050   PROTEINUR NEGATIVE 04/04/2016 2050   UROBILINOGEN 0.2 08/12/2010 1605   NITRITE NEGATIVE 04/04/2016 2050   LEUKOCYTESUR NEGATIVE 04/04/2016 2050   LEUKOCYTESUR 0-5 06/07/2015 1039   Sepsis Labs Invalid input(s): PROCALCITONIN,  WBC,  LACTICIDVEN Microbiology Recent Results (from the past 240 hour(s))  Aerobic Culture (superficial specimen)     Status: None   Collection Time: 03/27/16  4:00 PM  Result Value Ref Range Status   Specimen Description BUTTOCKS  Final   Special Requests NONE  Final   Gram Stain   Final    RARE WBC PRESENT,BOTH PMN AND MONONUCLEAR FEW GRAM POSITIVE COCCI IN PAIRS Performed  at [facility], [place] [address], [place], [place] [address]    Culture   Final    MODERATE STAPHYLOCOCCUS AUREUS FEW KLEBSIELLA OXYTOCA    Report Status 03/30/2016 FINAL  Final   Organism ID, Bacteria STAPHYLOCOCCUS AUREUS  Final   Organism ID, Bacteria KLEBSIELLA OXYTOCA  Final      Susceptibility   Klebsiella oxytoca - MIC*    AMPICILLIN >=32  RESISTANT Resistant     CEFAZOLIN >=64 RESISTANT Resistant     CEFEPIME <=1 SENSITIVE Sensitive     CEFTAZIDIME <=1 SENSITIVE Sensitive     CEFTRIAXONE <=1 SENSITIVE Sensitive     CIPROFLOXACIN <=0.25 SENSITIVE Sensitive     GENTAMICIN <=1 SENSITIVE Sensitive     IMIPENEM <=0.25 SENSITIVE Sensitive     TRIMETH/SULFA <=20 SENSITIVE Sensitive     AMPICILLIN/SULBACTAM 16 INTERMEDIATE Intermediate     PIP/TAZO <=4 SENSITIVE Sensitive     Extended ESBL NEGATIVE Sensitive     * FEW KLEBSIELLA OXYTOCA   Staphylococcus aureus - MIC*    CIPROFLOXACIN >=8 RESISTANT Resistant     ERYTHROMYCIN >=8 RESISTANT Resistant     GENTAMICIN <=0.5 SENSITIVE Sensitive     OXACILLIN >=4 RESISTANT Resistant     TETRACYCLINE <=1 SENSITIVE Sensitive     VANCOMYCIN <=0.5 SENSITIVE Sensitive     TRIMETH/SULFA <=10 SENSITIVE Sensitive     CLINDAMYCIN <=0.25 SENSITIVE Sensitive     RIFAMPIN <=0.5 SENSITIVE Sensitive     Inducible Clindamycin NEGATIVE Sensitive     * MODERATE STAPHYLOCOCCUS AUREUS  Urine culture     Status: None   Collection Time: 04/04/16  8:50 PM  Result Value Ref Range Status   Specimen Description URINE, CATHETERIZED  Final   Special Requests NONE  Final   Culture   Final    NO GROWTH Performed at [facility], [place] [address] [place], [place] [address]    Report Status 04/06/2016 FINAL  Final  Culture, blood (Routine X 2) w Reflex to ID Panel     Status: None (Preliminary result)   Collection Time: 04/04/16 10:13 PM  Result Value Ref Range Status   Specimen Description BLOOD LEFT ARM  Final   Special Requests BOTTLES DRAWN AEROBIC AND ANAEROBIC 6CC  Final   Culture NO GROWTH 2 DAYS  Final   Report Status PENDING  Incomplete  Culture, blood (Routine X 2) w Reflex to ID Panel     Status: None (Preliminary result)   Collection Time: 04/04/16 10:22 PM  Result Value Ref Range Status   Specimen Description BLOOD LEFT HAND  Final   Special Requests BOTTLES DRAWN AEROBIC AND  ANAEROBIC 6CC  Final   Culture NO GROWTH 2 DAYS  Final   Report Status PENDING  Incomplete     Time coordinating discharge: 35 minutes  SIGNED:   Loretha Stapler, MD  [facility] 04/06/2016, 11:37 AM Pager [phone] If 7PM-7AM, please contact night-coverage [email or web address] Password [email or web address]

## 2016-04-08 ENCOUNTER — Ambulatory Visit (HOSPITAL_COMMUNITY): Payer: PPO | Admitting: Physical Therapy

## 2016-04-08 DIAGNOSIS — M79662 Pain in left lower leg: Secondary | ICD-10-CM | POA: Diagnosis not present

## 2016-04-08 DIAGNOSIS — R2681 Unsteadiness on feet: Secondary | ICD-10-CM

## 2016-04-08 DIAGNOSIS — I83028 Varicose veins of left lower extremity with ulcer other part of lower leg: Secondary | ICD-10-CM | POA: Diagnosis not present

## 2016-04-08 DIAGNOSIS — L97901 Non-pressure chronic ulcer of unspecified part of unspecified lower leg limited to breakdown of skin: Secondary | ICD-10-CM

## 2016-04-08 DIAGNOSIS — I83018 Varicose veins of right lower extremity with ulcer other part of lower leg: Secondary | ICD-10-CM

## 2016-04-08 DIAGNOSIS — I872 Venous insufficiency (chronic) (peripheral): Secondary | ICD-10-CM | POA: Diagnosis not present

## 2016-04-08 DIAGNOSIS — M79661 Pain in right lower leg: Secondary | ICD-10-CM | POA: Diagnosis not present

## 2016-04-08 DIAGNOSIS — L97819 Non-pressure chronic ulcer of other part of right lower leg with unspecified severity: Secondary | ICD-10-CM

## 2016-04-08 DIAGNOSIS — L97829 Non-pressure chronic ulcer of other part of left lower leg with unspecified severity: Secondary | ICD-10-CM

## 2016-04-08 DIAGNOSIS — I890 Lymphedema, not elsewhere classified: Secondary | ICD-10-CM | POA: Diagnosis not present

## 2016-04-08 NOTE — Therapy (Signed)
[place], [name], [address] Phone: [phone]   Fax:  [phone]  Wound Care Therapy/reassess  Patient Details  Name: [name] MRN: [id number] Date of Birth: 08-08-1933 Referring Provider: [name]   Encounter Date: 04/08/2016      PT End of Session - 04/08/16 1203    Visit Number 37   Number of Visits 43   Date for PT Re-Evaluation 05/06/16   Authorization Type [employer] : g code and recert done at visit 62   Authorization - Visit Number 23   Authorization - Number of Visits 43   PT Start Time 1030   PT Stop Time 1130   PT Time Calculation (min) 60 min   Activity Tolerance Patient tolerated treatment well   Behavior During Therapy [facility] for tasks assessed/performed      Past Medical History:  Diagnosis Date  . Chronic lung disease    on home O2   . Complete heart block (HCC)    s/p PPM by Dr Rayann Heman  . Debilitated   . Diastolic dysfunction   . Hypertension   . Lymphedema of right lower extremity   . MI (mitral incompetence)   . Obesity   . Permanent atrial fibrillation ([place])   . Pulmonary hypertension   . Rheumatoid arthritis(714.0)   . Sleep apnea   . Stroke [facility])    remote  . Venous insufficiency    with chronic leg ulcers    Past Surgical History:  Procedure Laterality Date  . PACEMAKER INSERTION  08/13/10   SJM by Dr Rayann Heman    There were no vitals filed for this visit.                  Wound Therapy - 04/08/16 1147    Subjective daughter states pateint was admitted to hospital 3/9 and was there for 3 days due to CHF and pneumonia.  States they added a zithromyocin in addition to antibiotic prescribed for LE's.  Dressings on LE's were changed on Saturday to xeroform and coban 2 compression.  Pt much improved this session.   Pain Assessment No/denies pain   Wound Properties Date First Assessed: 04/05/16 Time First Assessed: 1230 Wound Type: Venous  stasis ulcer Location: Leg Location Orientation: Left Wound Description (Comments): small open sore to above lateral ankle Present on Admission: Yes   Dressing Type Abdominal pads;Foam;Gauze (Comment)   Dressing Changed Changed   Dressing Status Clean;Dry;Intact   Dressing Change Frequency Other (Comment)   Site / Wound Assessment Red   % Wound base Red or Granulating 100%   Peri-wound Assessment Intact;Hemosiderin;Pink;Other (Comment)  very dry, scaly   Wound Length (cm) 1 cm  2 areas same size   Wound Width (cm) 1 cm  2 areas same size   Wound Depth (cm) 0.2 cm  2 areas same size   Margins Unattached edges (unapproximated)   Closure None   Drainage Amount Minimal   Drainage Description Serosanguineous   Treatment Cleansed;Debridement (Selective)   Wound Properties Date First Assessed: 04/05/16 Time First Assessed: 1831 Wound Type: Venous stasis ulcer Location: Leg Location Orientation: Right Wound Description (Comments): open ulcer around shin and calf Present on Admission: Yes   Dressing Type Gauze (Comment);Compression wrap;Abdominal pads   Dressing Changed Changed   Dressing Status Clean;Dry;Intact   Dressing Change Frequency Other (Comment)  2X week   Site / Wound Assessment Friable;Yellow;Red   % Wound base Red or Granulating 60%   %  Wound base Yellow/Fibrinous Exudate 40%   Peri-wound Assessment Hemosiderin;Pink;Intact  friable   Wound Length (cm) --  multiple areas, area in front with horseshoe appearance    Wound Width (cm) --  wraps around lateral LE without depth   Margins Unattached edges (unapproximated)   Closure None   Drainage Amount Minimal   Drainage Description Serosanguineous   Treatment Cleansed;Debridement (Selective)   Selective Debridement - Location Bilateral LE   Selective Debridement - Tools Used Forceps   Selective Debridement - Tissue Removed slough and dry skin   Wound Therapy - Clinical Statement Both LE's with much improvement since recent  hospitalization.  Daughter reports they changed her dressings on Saturday at the hospital and appied xeroform, which shown improvment.  Pt is still on antibiotics and oveerall improved.  Noted improvement in granualtion with reduced size and approximation in Both LE wounds.  Able to remove significant amount of dry tissue from Lt LE this session as well.  both LE's cleansed and moisturized well prior to redressing and bandaging.  Xeroform used on Bil LE cut to fit wounds.  Profore used for compression.    Factors Delaying/Impairing Wound Healing Infection - systemic/local;Multiple medical problems;Vascular compromise   Hydrotherapy Plan Debridement;Dressing change   Wound Therapy - Frequency Other (comment)   Wound Therapy - Current Recommendations --  2 times per week x 4  More weeks    Wound Therapy - Follow Up Recommendations --  pt has upcoming visit with vascular MD   Wound Plan Continue per current plan with appropriate dressings conducive for healing environment.                    PT Short Term Goals - 04/08/16 1201      PT SHORT TERM GOAL #1   Title Pt to verbalize the importance of using compression every day to keep legs wound free   Time 1   Period Weeks   Status Achieved     PT SHORT TERM GOAL #2   Title Pt to state that the pain in her legs are no greater than 3/10 to allow pt to ambulate for five minutes without stopping    Time 2   Period Weeks   Status Achieved     PT SHORT TERM GOAL #3   Title Pt to have only 5 wounds on Rt LE and 2 on Lt LE    Time 2   Period Weeks   Status On-going           PT Long Term Goals - 04/08/16 1202      PT LONG TERM GOAL #1   Title Pt to be wound free to reduce risk of infection    Time 4   Period Weeks   Status On-going     PT LONG TERM GOAL #2   Title Pt to have obtained compression garment and to be able to don and doff with minimal assist from family    Time 4   Period Weeks   Status On-going     PT LONG  TERM GOAL #3   Title Pt/ caregiver to understand and demonstrate correct wear time of garments and adhere to moisturizing LE's each night.     Time 4   Period Weeks   Status On-going       [phone] CK [phone] CJ      Patient will benefit from skilled therapeutic intervention in order to improve the following deficits and impairments:  Visit Diagnosis: Lymphedema of right lower extremity  Chronic ulcer of leg, limited to breakdown of skin, unspecified laterality (HCC)  Pain in right lower leg  Unsteadiness on feet  Stasis dermatitis of both legs  Venous insufficiency  Varicose veins of right lower extremity with ulcer other part of lower leg (HCC)  Pain in left lower leg  Varicose veins of left lower extremity with ulcer other part of lower leg [facility])     Problem List Patient Active Problem List   Diagnosis Date Noted  . CAP (community acquired pneumonia) 04/04/2016  . Alzheimer's disease 10/11/2014  . Essential hypertension 10/11/2014  . Cardiomyopathy, dilated ([place]) 07/24/2014  . Apnea, sleep 07/24/2014  . Anemia 04/28/2014  . Chronic hypoxemic respiratory failure ([place]) 04/12/2014  . Chronic ulcer of leg ([place]) 04/12/2014  . Lymphedema of leg 04/12/2014  . Disorder of nutrition 04/12/2014  . Hypothyroidism 03/24/2014  . Stasis dermatitis of both legs 05/06/2013  . Pacemaker-[employer] 11/25/2011  . Complete heart block ([place]) 12/12/2010  . Permanent atrial fibrillation ([place]) 12/12/2010  . Diastolic dysfunction 96/28/3662  . Venous insufficiency 12/12/2010  . Long term current use of anticoagulant 09/30/2002    Teena Irani, PTA/CLT [phone] 04/08/2016, 12:09 PM Rayetta Humphrey, PT CLT [place] [address] [place], [name], [address] Phone: [phone]   Fax:  [phone]  Name: [name] MRN: [id number] Date of Birth: 09-25-1933

## 2016-04-09 LAB — CULTURE, BLOOD (ROUTINE X 2)
Culture: NO GROWTH
Culture: NO GROWTH

## 2016-04-10 ENCOUNTER — Ambulatory Visit (HOSPITAL_COMMUNITY): Payer: PPO | Admitting: Physical Therapy

## 2016-04-10 DIAGNOSIS — I890 Lymphedema, not elsewhere classified: Secondary | ICD-10-CM

## 2016-04-10 DIAGNOSIS — L97901 Non-pressure chronic ulcer of unspecified part of unspecified lower leg limited to breakdown of skin: Secondary | ICD-10-CM

## 2016-04-10 NOTE — Therapy (Signed)
[place], [name], [address] Phone: [phone]   Fax:  [phone]  Wound Care Therapy  Patient Details  Name: [name] MRN: [id number] Date of Birth: 07-06-1933 Referring Provider: [name]   Encounter Date: 04/10/2016      PT End of Session - 04/10/16 1236    Visit Number 38   Number of Visits 43   Date for PT Re-Evaluation 05/06/16   Authorization Type [employer] : g code and recert done at visit 9   Authorization - Visit Number 64   Authorization - Number of Visits 43   PT Start Time 1120   PT Stop Time 1200   PT Time Calculation (min) 40 min   Activity Tolerance Patient tolerated treatment well   Behavior During Therapy [facility] for tasks assessed/performed      Past Medical History:  Diagnosis Date  . Chronic lung disease    on home O2   . Complete heart block (HCC)    s/p PPM by Dr Rayann Heman  . Debilitated   . Diastolic dysfunction   . Hypertension   . Lymphedema of right lower extremity   . MI (mitral incompetence)   . Obesity   . Permanent atrial fibrillation ([place])   . Pulmonary hypertension   . Rheumatoid arthritis(714.0)   . Sleep apnea   . Stroke [facility])    remote  . Venous insufficiency    with chronic leg ulcers    Past Surgical History:  Procedure Laterality Date  . PACEMAKER INSERTION  08/13/10   SJM by Dr Rayann Heman    There were no vitals filed for this visit.                  Wound Therapy - 04/10/16 1223    Subjective daughter states pateint was admitted to hospitatl 3/9 and was there for 3 days due to CHF and pneumonia.  States they added a zithromyocin in addition to antibiotic prescribed for LE's.  Dressings on LE's were changed on Saturday to xeroform and coban 2 compression.  Pt much improved this session.   Pain Assessment No/denies pain   Wound Properties Date First Assessed: 04/05/16 Time First Assessed: 1230 Wound Type: Venous stasis  ulcer Location: Leg Location Orientation: Left Wound Description (Comments): small open sore to above lateral ankle Present on Admission: Yes   Dressing Type Abdominal pads;Compression wrap;Gauze (Comment)   Dressing Changed Changed  honey    Dressing Status Clean;Old drainage   Dressing Change Frequency Other (Comment)   Site / Wound Assessment Red   % Wound base Red or Granulating 100%   Peri-wound Assessment Intact;Hemosiderin;Pink;Other (Comment)  very dry, scaly   Margins Unattached edges (unapproximated)   Closure None   Drainage Amount Minimal   Drainage Description Serosanguineous   Treatment Cleansed;Debridement (Selective)   Wound Properties Date First Assessed: 04/05/16 Time First Assessed: 1831 Wound Type: Venous stasis ulcer Location: Leg Location Orientation: Right Wound Description (Comments): open ulcer around shin and calf Present on Admission: Yes   Dressing Type Gauze (Comment);Compression wrap;Abdominal pads   Dressing Changed Changed   Dressing Status Clean;Old drainage   Dressing Change Frequency Other (Comment)  2X week   Site / Wound Assessment Friable;Yellow;Red   % Wound base Red or Granulating 75%   % Wound base Yellow/Fibrinous Exudate 25%   Peri-wound Assessment Hemosiderin;Pink;Intact  friable   Margins Unattached edges (unapproximated)   Closure None   Drainage Amount Minimal  Drainage Description Serosanguineous   Treatment Cleansed;Debridement (Selective)   Selective Debridement - Location Bilateral LE   Selective Debridement - Tools Used Forceps   Selective Debridement - Tissue Removed slough and dry skin   Wound Therapy - Clinical Statement Pt Rt LE is beginning to have hypergranulation; therapist placed increased pressure with profore. Increased granulation noted on Rt LE wounds.  Pt will continue to benefit from debridement of wounds to promote healing.    Factors Delaying/Impairing Wound Healing Infection - systemic/local;Multiple medical  problems;Vascular compromise   Hydrotherapy Plan Debridement;Dressing change   Wound Therapy - Frequency Other (comment)   Wound Therapy - Current Recommendations --  2 times per week   Wound Therapy - Follow Up Recommendations --  pt has upcoming visit with vascular MD   Wound Plan Assess hypergranulation if it continue cut  and add foam to dressing change.                    PT Short Term Goals - 04/10/16 1238      PT SHORT TERM GOAL #1   Title Pt to verbalize the importance of using compression every day to keep legs wound free   Time 1   Period Weeks   Status Achieved     PT SHORT TERM GOAL #2   Title Pt to state that the pain in her legs are no greater than 3/10 to allow pt to ambulate for five minutes without stopping    Time 2   Period Weeks   Status Achieved     PT SHORT TERM GOAL #3   Title Pt to have only 5 wounds on Rt LE and 2 on Lt LE    Time 2   Period Weeks   Status Achieved           PT Long Term Goals - 04/10/16 1238      PT LONG TERM GOAL #1   Title Pt to be wound free to reduce risk of infection    Time 4   Period Weeks   Status On-going     PT LONG TERM GOAL #2   Title Pt to have obtained compression garment and to be able to don and doff with minimal assist from family   has compression unable to use due to wounds    Time 4   Period Weeks   Status Partially Met     PT LONG TERM GOAL #3   Title Pt/ caregiver to understand and demonstrate correct wear time of garments and adhere to moisturizing LE's each night.     Time 4   Period Weeks   Status On-going               Plan - 04/10/16 1237    Clinical Impression Statement see above    Rehab Potential Good   PT Frequency 2x / week   PT Duration 12 weeks  followed by one time a week for four weeks    PT Treatment/Interventions ADLs/Self Care Home Management;Gait training;Stair training;Therapeutic exercise;Patient/family education;Other (comment)  debridement and  multilayer compression dressing    PT Next Visit Plan see above       Patient will benefit from skilled therapeutic intervention in order to improve the following deficits and impairments:  Pain, Increased edema, Difficulty walking, Decreased balance, Other (comment) (nonhealing wound )  Visit Diagnosis: Lymphedema of right lower extremity  Chronic ulcer of leg, limited to breakdown of skin, unspecified laterality ([place])  Problem List Patient Active Problem List   Diagnosis Date Noted  . CAP (community acquired pneumonia) 04/04/2016  . Alzheimer's disease 10/11/2014  . Essential hypertension 10/11/2014  . Cardiomyopathy, dilated ([place]) 07/24/2014  . Apnea, sleep 07/24/2014  . Anemia 04/28/2014  . Chronic hypoxemic respiratory failure ([place]) 04/12/2014  . Chronic ulcer of leg ([place]) 04/12/2014  . Lymphedema of leg 04/12/2014  . Disorder of nutrition 04/12/2014  . Hypothyroidism 03/24/2014  . Stasis dermatitis of both legs 05/06/2013  . Pacemaker-St.Jude 11/25/2011  . Complete heart block ([place]) 12/12/2010  . Permanent atrial fibrillation ([place]) 12/12/2010  . Diastolic dysfunction 44/61/9012  . Venous insufficiency 12/12/2010  . Long term current use of anticoagulant 09/30/2002  Rayetta Humphrey, PT CLT [phone] 04/10/2016, 12:40 PM  [facility] [address] [place], [name], [address] Phone: [phone]   Fax:  [phone]  Name: GIULIA HICKEY MRN: [id number] Date of Birth: 08-26-1933

## 2016-04-15 ENCOUNTER — Ambulatory Visit (HOSPITAL_COMMUNITY): Payer: PPO | Admitting: Physical Therapy

## 2016-04-15 DIAGNOSIS — L97901 Non-pressure chronic ulcer of unspecified part of unspecified lower leg limited to breakdown of skin: Secondary | ICD-10-CM

## 2016-04-15 DIAGNOSIS — I890 Lymphedema, not elsewhere classified: Secondary | ICD-10-CM

## 2016-04-15 DIAGNOSIS — I872 Venous insufficiency (chronic) (peripheral): Secondary | ICD-10-CM

## 2016-04-15 DIAGNOSIS — M79661 Pain in right lower leg: Secondary | ICD-10-CM

## 2016-04-15 NOTE — Therapy (Signed)
[place], [name], [address] Phone: [phone]   Fax:  [phone]  Wound Care Therapy  Patient Details  Name: [name] MRN: [id number] Date of Birth: Jul 22, 1933 Referring Provider: [name]   Encounter Date: 04/15/2016      PT End of Session - 04/15/16 1615    Visit Number 39   Number of Visits 43   Date for PT Re-Evaluation 05/06/16   Authorization Type [employer] : g code and recert done at visit 55   Authorization - Visit Number 110   Authorization - Number of Visits 43   PT Start Time 1038   PT Stop Time 1120   PT Time Calculation (min) 42 min   Activity Tolerance Patient tolerated treatment well   Behavior During Therapy [facility] for tasks assessed/performed      Past Medical History:  Diagnosis Date  . Chronic lung disease    on home O2   . Complete heart block (HCC)    s/p PPM by Dr Rayann Heman  . Debilitated   . Diastolic dysfunction   . Hypertension   . Lymphedema of right lower extremity   . MI (mitral incompetence)   . Obesity   . Permanent atrial fibrillation ([place])   . Pulmonary hypertension   . Rheumatoid arthritis(714.0)   . Sleep apnea   . Stroke [facility])    remote  . Venous insufficiency    with chronic leg ulcers    Past Surgical History:  Procedure Laterality Date  . PACEMAKER INSERTION  08/13/10   SJM by Dr Rayann Heman    There were no vitals filed for this visit.                  Wound Therapy - 04/15/16 1608    Subjective Pt states her legs only hurt when they are being touched.     Pain Assessment No/denies pain   Wound Properties Date First Assessed: 04/05/16 Time First Assessed: 1230 Wound Type: Venous stasis ulcer Location: Leg Location Orientation: Left Wound Description (Comments): small open sore to above lateral ankle Present on Admission: Yes   Dressing Type Abdominal pads;Compression wrap;Gauze (Comment)   Dressing Status Clean;Old  drainage   Dressing Change Frequency Other (Comment)   Site / Wound Assessment Red   % Wound base Red or Granulating 100%   Peri-wound Assessment Intact;Hemosiderin;Pink;Other (Comment)  very dry, scaly   Wound Length (cm) 1 cm  second wound more distal is 1.2 x .9 no depth.   Wound Width (cm) 0.8 cm   Wound Depth (cm) 0 cm   Margins Unattached edges (unapproximated)   Closure None   Drainage Amount Minimal   Drainage Description Serosanguineous   Treatment Cleansed;Debridement (Selective)   Wound Properties Date First Assessed: 04/05/16 Time First Assessed: 1831 Wound Type: Venous stasis ulcer Location: Leg Location Orientation: Right Wound Description (Comments): open ulcer around shin and calf Present on Admission: Yes   Dressing Type Compression wrap;Impregnated gauze (bismuth)   Dressing Changed Changed   Dressing Status Clean;Old drainage   Dressing Change Frequency Other (Comment)  2X week   Site / Wound Assessment Friable;Yellow;Red   % Wound base Red or Granulating 90%   % Wound base Yellow/Fibrinous Exudate 10%   Peri-wound Assessment Hemosiderin;Pink;Intact  friable   Wound Length (cm) --  Ant wound horseshoe with length 10cm, top width 6 cm; lat 3.   Wound Width (cm) --  2nd wound medial 11x 3  cm    Margins Unattached edges (unapproximated)   Closure None   Drainage Amount Minimal   Drainage Description Serosanguineous   Treatment Cleansed;Debridement (Selective)   Selective Debridement - Location Bilateral LE   Selective Debridement - Tools Used Forceps   Selective Debridement - Tissue Removed slough and dry skin   Wound Therapy - Clinical Statement There is not hypergranulation upon unwrapping the profore but with 30 mintues of the compression coming off granulated tissue is notably higher than pt skin level.  Profore is keeping enough compression we will not need to add foam but pt may benefit from decreasing to once a week to keep tissue compressed during  healing.    Factors Delaying/Impairing Wound Healing Infection - systemic/local;Multiple medical problems;Vascular compromise   Hydrotherapy Plan Debridement;Dressing change   Wound Therapy - Frequency Other (comment)   Wound Therapy - Current Recommendations --  2 times per week   Wound Therapy - Follow Up Recommendations --  pt has upcoming visit with vascular MD   Wound Plan decrease next treatment to one time a week.                   PT Short Term Goals - 04/10/16 1238      PT SHORT TERM GOAL #1   Title Pt to verbalize the importance of using compression every day to keep legs wound free   Time 1   Period Weeks   Status Achieved     PT SHORT TERM GOAL #2   Title Pt to state that the pain in her legs are no greater than 3/10 to allow pt to ambulate for five minutes without stopping    Time 2   Period Weeks   Status Achieved     PT SHORT TERM GOAL #3   Title Pt to have only 5 wounds on Rt LE and 2 on Lt LE    Time 2   Period Weeks   Status Achieved           PT Long Term Goals - 04/10/16 1238      PT LONG TERM GOAL #1   Title Pt to be wound free to reduce risk of infection    Time 4   Period Weeks   Status On-going     PT LONG TERM GOAL #2   Title Pt to have obtained compression garment and to be able to don and doff with minimal assist from family   has compression unable to use due to wounds    Time 4   Period Weeks   Status Partially Met     PT LONG TERM GOAL #3   Title Pt/ caregiver to understand and demonstrate correct wear time of garments and adhere to moisturizing LE's each night.     Time 4   Period Weeks   Status On-going               Plan - 04/15/16 1616    Clinical Impression Statement see above    Rehab Potential Good   PT Frequency 2x / week   PT Duration 12 weeks  followed by one time a week for four weeks    PT Treatment/Interventions ADLs/Self Care Home Management;Gait training;Stair training;Therapeutic  exercise;Patient/family education;Other (comment)  debridement and multilayer compression dressing    PT Next Visit Plan see above       Patient will benefit from skilled therapeutic intervention in order to improve the following deficits and impairments:  Pain,  Increased edema, Difficulty walking, Decreased balance, Other (comment) (nonhealing wound )  Visit Diagnosis: Lymphedema of right lower extremity  Chronic ulcer of leg, limited to breakdown of skin, unspecified laterality (HCC)  Pain in right lower leg  Stasis dermatitis of both legs  Venous insufficiency     Problem List Patient Active Problem List   Diagnosis Date Noted  . CAP (community acquired pneumonia) 04/04/2016  . Alzheimer's disease 10/11/2014  . Essential hypertension 10/11/2014  . Cardiomyopathy, dilated ([place]) 07/24/2014  . Apnea, sleep 07/24/2014  . Anemia 04/28/2014  . Chronic hypoxemic respiratory failure ([place]) 04/12/2014  . Chronic ulcer of leg ([place]) 04/12/2014  . Lymphedema of leg 04/12/2014  . Disorder of nutrition 04/12/2014  . Hypothyroidism 03/24/2014  . Stasis dermatitis of both legs 05/06/2013  . Pacemaker-St.Jude 11/25/2011  . Complete heart block ([place]) 12/12/2010  . Permanent atrial fibrillation ([place]) 12/12/2010  . Diastolic dysfunction 46/50/3546  . Venous insufficiency 12/12/2010  . Long term current use of anticoagulant 09/30/2002   Rayetta Humphrey, PT CLT [phone] 04/15/2016, 4:17 PM  [place] [address] [place], [name], [address] Phone: [phone]   Fax:  [phone]  Name: [name] MRN: [id number] Date of Birth: 07/01/1933

## 2016-04-16 ENCOUNTER — Ambulatory Visit: Payer: PPO | Admitting: Family Medicine

## 2016-04-17 ENCOUNTER — Ambulatory Visit (HOSPITAL_COMMUNITY): Payer: PPO | Admitting: Physical Therapy

## 2016-04-17 ENCOUNTER — Ambulatory Visit (INDEPENDENT_AMBULATORY_CARE_PROVIDER_SITE_OTHER): Payer: PPO | Admitting: Family Medicine

## 2016-04-17 ENCOUNTER — Ambulatory Visit (INDEPENDENT_AMBULATORY_CARE_PROVIDER_SITE_OTHER): Payer: PPO

## 2016-04-17 ENCOUNTER — Encounter: Payer: Self-pay | Admitting: Family Medicine

## 2016-04-17 VITALS — BP 116/80 | HR 80 | Ht 59.0 in | Wt 139.0 lb

## 2016-04-17 DIAGNOSIS — S60212A Contusion of left wrist, initial encounter: Secondary | ICD-10-CM

## 2016-04-17 DIAGNOSIS — R2681 Unsteadiness on feet: Secondary | ICD-10-CM

## 2016-04-17 DIAGNOSIS — I83028 Varicose veins of left lower extremity with ulcer other part of lower leg: Secondary | ICD-10-CM

## 2016-04-17 DIAGNOSIS — I890 Lymphedema, not elsewhere classified: Secondary | ICD-10-CM | POA: Diagnosis not present

## 2016-04-17 DIAGNOSIS — L97829 Non-pressure chronic ulcer of other part of left lower leg with unspecified severity: Secondary | ICD-10-CM

## 2016-04-17 DIAGNOSIS — L97901 Non-pressure chronic ulcer of unspecified part of unspecified lower leg limited to breakdown of skin: Secondary | ICD-10-CM

## 2016-04-17 DIAGNOSIS — M79661 Pain in right lower leg: Secondary | ICD-10-CM

## 2016-04-17 MED ORDER — CIPROFLOXACIN HCL 250 MG PO TABS: 250.0000 mg | ORAL_TABLET | Freq: Two times a day (BID) | ORAL | 0 refills | Status: DC

## 2016-04-17 NOTE — Therapy (Signed)
[place], [name], [address] Phone: [phone]   Fax:  [phone]  Wound Care Therapy  Patient Details  Name: [name] MRN: [id number] Date of Birth: 03/03/1933 Referring Provider: [name]   Encounter Date: 04/17/2016      PT End of Session - 04/17/16 1239    Visit Number 40   Number of Visits 43   Date for PT Re-Evaluation 05/06/16   Authorization Type [employer] : g code and recert done at visit 57   Authorization - Visit Number 40   Authorization - Number of Visits 43   PT Start Time 0102   PT Stop Time 1204   PT Time Calculation (min) 39 min   Activity Tolerance Patient tolerated treatment well   Behavior During Therapy [facility] for tasks assessed/performed      Past Medical History:  Diagnosis Date  . Chronic lung disease    on home O2   . Complete heart block (HCC)    s/p PPM by Dr Rayann Heman  . Debilitated   . Diastolic dysfunction   . Hypertension   . Lymphedema of right lower extremity   . MI (mitral incompetence)   . Obesity   . Permanent atrial fibrillation ([place])   . Pulmonary hypertension   . Rheumatoid arthritis(714.0)   . Sleep apnea   . Stroke [facility]    remote  . Venous insufficiency    with chronic leg ulcers    Past Surgical History:  Procedure Laterality Date  . PACEMAKER INSERTION  08/13/10   SJM by Dr Rayann Heman    There were no vitals filed for this visit.                  Wound Therapy - 04/17/16 1233    Subjective Pt complaining of increased Lt wrist pain, wrist is red, hot and swollen as well as increased Right foot pain.  Pt daughter states that she has already made a MD appointment for this afternoon for the patient.    Pain Assessment 0-10   Pain Score 7    Pain Type Acute pain   Pain Location Wrist   Pain Orientation Left   Pain Descriptors / Indicators Burning   Pain Onset On-going   Patients Stated Pain Goal 0   Pain  Intervention(s) Distraction   Multiple Pain Sites Yes   Evaluation and Treatment Procedures Explained to Patient/Family Yes   Evaluation and Treatment Procedures agreed to   Wound Properties Date First Assessed: 04/05/16 Time First Assessed: 1230 Wound Type: Venous stasis ulcer Location: Leg Location Orientation: Left Wound Description (Comments): small open sore to above lateral ankle Present on Admission: Yes   Dressing Type Abdominal pads;Compression wrap;Gauze (Comment)   Dressing Changed Changed   Dressing Status Clean;Old drainage   Dressing Change Frequency Other (Comment)   Site / Wound Assessment Red   % Wound base Red or Granulating 100%   Peri-wound Assessment Intact;Hemosiderin;Pink;Other (Comment)  very dry, scaly   Margins Unattached edges (unapproximated)   Closure None   Drainage Amount Minimal   Drainage Description Serosanguineous   Treatment Cleansed;Debridement (Selective)   Wound Properties Date First Assessed: 04/05/16 Time First Assessed: 1831 Wound Type: Venous stasis ulcer Location: Leg Location Orientation: Right Wound Description (Comments): open ulcer around shin and calf Present on Admission: Yes   Dressing Type Compression wrap;Impregnated gauze (bismuth)   Dressing Changed Changed   Dressing Status Clean;Old drainage   Dressing Change  Frequency Other (Comment)  2X week   Site / Wound Assessment Friable;Yellow;Red   % Wound base Red or Granulating 90%   % Wound base Yellow/Fibrinous Exudate 10%   Peri-wound Assessment Hemosiderin;Pink;Intact  friable   Margins Unattached edges (unapproximated)   Closure None   Drainage Amount Moderate   Drainage Description Green   Treatment Cleansed;Debridement (Selective)   Selective Debridement - Location Bilateral LE   Selective Debridement - Tools Used Forceps   Selective Debridement - Tissue Removed slough and dry skin   Wound Therapy - Clinical Statement Pt had increased drainage that is green from right LE.   She is no longer on antibiotics.  Therapist advised daughter to let MD know about the drainage when she takes her mother to the MD this afternoon about her wrist.     Factors Delaying/Impairing Wound Healing Infection - systemic/local;Multiple medical problems;Vascular compromise   Hydrotherapy Plan Debridement;Dressing change using xeroform and profore compression bandages bilaterally.    Wound Therapy - Frequency Other (comment)   Wound Therapy - Current Recommendations --  2 times per week   Wound Therapy - Follow Up Recommendations --  pt has upcoming visit with vascular MD   Wound Plan Pt to continue to come in twice a week now that drainage has incerased and is green on her Rt LE                    PT Short Term Goals - 04/10/16 1238      PT SHORT TERM GOAL #1   Title Pt to verbalize the importance of using compression every day to keep legs wound free   Time 1   Period Weeks   Status Achieved     PT SHORT TERM GOAL #2   Title Pt to state that the pain in her legs are no greater than 3/10 to allow pt to ambulate for five minutes without stopping    Time 2   Period Weeks   Status Achieved     PT SHORT TERM GOAL #3   Title Pt to have only 5 wounds on Rt LE and 2 on Lt LE    Time 2   Period Weeks   Status Achieved           PT Long Term Goals - 04/10/16 1238      PT LONG TERM GOAL #1   Title Pt to be wound free to reduce risk of infection    Time 4   Period Weeks   Status On-going     PT LONG TERM GOAL #2   Title Pt to have obtained compression garment and to be able to don and doff with minimal assist from family   has compression unable to use due to wounds    Time 4   Period Weeks   Status Partially Met     PT LONG TERM GOAL #3   Title Pt/ caregiver to understand and demonstrate correct wear time of garments and adhere to moisturizing LE's each night.     Time 4   Period Weeks   Status On-going               Plan - 04/17/16 1239     Clinical Impression Statement see above    Rehab Potential Good   PT Frequency 2x / week   PT Duration 12 weeks  followed by one time a week for four weeks    PT Treatment/Interventions ADLs/Self Care Home Management;Gait   training;Stair training;Therapeutic exercise;Patient/family education;Other (comment)  debridement and multilayer compression dressing    PT Next Visit Plan see above       Patient will benefit from skilled therapeutic intervention in order to improve the following deficits and impairments:  Pain, Increased edema, Difficulty walking, Decreased balance, Other (comment) (nonhealing wound )  Visit Diagnosis: Lymphedema of right lower extremity  Chronic ulcer of leg, limited to breakdown of skin, unspecified laterality (HCC)  Pain in right lower leg  Unsteadiness on feet  Varicose veins of left lower extremity with ulcer other part of lower leg (HCC)     Problem List Patient Active Problem List   Diagnosis Date Noted  . CAP (community acquired pneumonia) 04/04/2016  . Alzheimer's disease 10/11/2014  . Essential hypertension 10/11/2014  . Cardiomyopathy, dilated (HCC) 07/24/2014  . Apnea, sleep 07/24/2014  . Anemia 04/28/2014  . Chronic hypoxemic respiratory failure (HCC) 04/12/2014  . Chronic ulcer of leg (HCC) 04/12/2014  . Lymphedema of leg 04/12/2014  . Disorder of nutrition 04/12/2014  . Hypothyroidism 03/24/2014  . Stasis dermatitis of both legs 05/06/2013  . Pacemaker-St.Jude 11/25/2011  . Complete heart block (HCC) 12/12/2010  . Permanent atrial fibrillation (HCC) 12/12/2010  . Diastolic dysfunction 12/12/2010  . Venous insufficiency 12/12/2010  . Long term current use of anticoagulant 09/30/2002     , PT CLT [phone] 04/17/2016, 12:40 PM  [facility] [address] [place], [place], [address] Phone: [phone]   Fax:  [phone]  Name: Shantavia M Cardenas MRN: [id number] Date  of Birth: 08/31/1933    

## 2016-04-17 NOTE — Progress Notes (Signed)
Subjective:  Patient ID: [name], female    DOB: 06-Oct-1933  Age: 83 y.o. MRN: [id number]  CC: Gout (pt here today c/o swelling and pain in the left wrist and hand. She was also seen by wound care today and they noticed green drainage from her leg and thought maybe she should be on antibiotics until the wound has cleared.)   HPI [name] presents for 4 days of increased swelling and pain in the left wrist. Of note is that she had an IV in her left wrist when she was hospitalized last week. The pain and swelling did not start until 3-4 days later. It seems to be progressing in that there is more swelling and pain since onset. There has been some bruising as well. Was noted while hospitalized. The patient also has green discharge from her wound on the legs. That is under the care of the wound center. They have asked that antibiotics be prescribed for her.  History [name] has a past medical history of Chronic lung disease; Complete heart block ([place]); Debilitated; Diastolic dysfunction; Hypertension; Lymphedema of right lower extremity; MI (mitral incompetence); Obesity; Permanent atrial fibrillation ([place]); Pulmonary hypertension; Rheumatoid arthritis(714.0); Sleep apnea; Stroke [facility]); and Venous insufficiency.   She has a past surgical history that includes Pacemaker insertion (08/13/10).   Her family history is not on file.She reports that she has never smoked. She has never used smokeless tobacco. She reports that she does not drink alcohol or use drugs.  Current Outpatient Prescriptions on File Prior to Visit  Medication Sig Dispense Refill  . apixaban (ELIQUIS) 5 MG TABS tablet Take 1 tablet (5 mg total) by mouth 2 (two) times daily. (Patient taking differently: Take 5 mg by mouth every morning. ) 42 tablet 0  . donepezil (ARICEPT) 10 MG tablet TAKE ONE (1) TABLET AT BEDTIME 90 tablet 1  . furosemide (LASIX) 40 MG tablet TAKE 1 TABLET EVERY MORNING AND TAKE 1/2 TABLET AT LUNCH  (Patient taking differently: TAKE 1 TABLET EVERY MORNING) 135 tablet 1  . hydrocortisone (PROCTOZONE-HC) 2.5 % rectal cream Place 1 application rectally 2 (two) times daily. 30 g 0  . levothyroxine (SYNTHROID, LEVOTHROID) 50 MCG tablet TAKE ONE (1) TABLET EACH DAY 90 tablet 2  . memantine (NAMENDA) 10 MG tablet Take 1 tablet (10 mg total) by mouth 2 (two) times daily. 60 tablet 5  . Multiple Vitamins-Minerals (MULTIVITAMIN WITH MINERALS) tablet Take 1 tablet by mouth daily.    . OXYGEN-HELIUM IN Inhale 4 L into the lungs continuous. 4L    . simvastatin (ZOCOR) 20 MG tablet TAKE ONE (1) TABLET EACH DAY 90 tablet 0  . traMADol (ULTRAM) 50 MG tablet Take 1 tablet (50 mg total) by mouth 3 (three) times daily as needed for moderate pain. 60 tablet 3   No current facility-administered medications on file prior to visit.     ROS Review of Systems  Constitutional: Negative for fever.  HENT: Negative for congestion, rhinorrhea and sore throat.   Respiratory: Negative for cough and shortness of breath.   Cardiovascular: Negative for chest pain and palpitations.  Gastrointestinal: Negative for abdominal pain.  Musculoskeletal: Positive for arthralgias, joint swelling and myalgias.    Objective:  BP 116/80   Pulse 80   Ht _0  (1.499 m)   Wt 139 lb (63 kg)   SpO2 92%   BMI 28.07 kg/m   Physical Exam  Constitutional: She is oriented to person, place, and time. She appears  well-developed and well-nourished. No distress.  HENT:  Head: Normocephalic and atraumatic.  Eyes: Conjunctivae are normal. Pupils are equal, round, and reactive to light.  Neck: Normal range of motion. Neck supple. No thyromegaly present.  Cardiovascular: Normal rate, regular rhythm and normal heart sounds.   No murmur heard. Pulmonary/Chest: Effort normal and breath sounds normal. No respiratory distress. She has no wheezes. She has no rales.  Abdominal: Soft. Bowel sounds are normal. She exhibits no distension. There  is no tenderness.  Musculoskeletal: Normal range of motion.  Lymphadenopathy:    She has no cervical adenopathy.  Neurological: She is alert and oriented to person, place, and time.  Skin: Skin is warm and dry. Rash (/Extensive dressings cover this stasis ulcerations of both lower extremities.) noted.  Hematoma formation noted over the cephalic vein at the left wrist. The joint itself is 2-3+ edematous with minimal to moderate erythema.   Psychiatric: She has a normal mood and affect. Her behavior is normal. Judgment and thought content normal.    Assessment & Plan:   Emilene was seen today for gout.  Diagnoses and all orders for this visit:  Contusion of left wrist, initial encounter -     DG Wrist Complete Left; Future -     CMP14+EGFR  Other orders -     ciprofloxacin (CIPRO) 250 MG tablet; Take 1 tablet (250 mg total) by mouth 2 (two) times daily. Take all of these.   I have discontinued [name]'s sulfamethoxazole-trimethoprim. I am also having her start on ciprofloxacin. Additionally, I am having her maintain her OXYGEN-HELIUM IN, multivitamin with minerals, traMADol, levothyroxine, hydrocortisone, apixaban, furosemide, memantine, donepezil, and simvastatin.  Meds ordered this encounter  Medications  . ciprofloxacin (CIPRO) 250 MG tablet    Sig: Take 1 tablet (250 mg total) by mouth 2 (two) times daily. Take all of these.    Dispense:  30 tablet    Refill:  0    X-ray left wrist: No fracture noted. Decrease use apply ice as needed  Follow-up: No Follow-up on file.  [name], M.D.

## 2016-04-18 LAB — CMP14+EGFR
ALK PHOS: 65 IU/L (ref 39–117)
ALT: 10 IU/L (ref 0–32)
AST: 20 IU/L (ref 0–40)
Albumin/Globulin Ratio: 1.3 (ref 1.2–2.2)
Albumin: 3.5 g/dL (ref 3.5–4.7)
BILIRUBIN TOTAL: 0.4 mg/dL (ref 0.0–1.2)
BUN/Creatinine Ratio: 20 (ref 12–28)
BUN: 22 mg/dL (ref 8–27)
CO2: 30 mmol/L — AB (ref 18–29)
CREATININE: 1.12 mg/dL — AB (ref 0.57–1.00)
Calcium: 9.1 mg/dL (ref 8.7–10.3)
Chloride: 95 mmol/L — ABNORMAL LOW (ref 96–106)
GFR calc Af Amer: 54 mL/min/{1.73_m2} — ABNORMAL LOW (ref >59)
GFR calc non Af Amer: 47 mL/min/{1.73_m2} — ABNORMAL LOW (ref >59)
GLUCOSE: 156 mg/dL — AB (ref 65–99)
Globulin, Total: 2.8 g/dL (ref 1.5–4.5)
Potassium: 4.7 mmol/L (ref 3.5–5.2)
Sodium: 141 mmol/L (ref 134–144)
Total Protein: 6.3 g/dL (ref 6.0–8.5)

## 2016-04-21 ENCOUNTER — Ambulatory Visit: Payer: PPO | Admitting: Family Medicine

## 2016-04-22 ENCOUNTER — Ambulatory Visit (HOSPITAL_COMMUNITY): Payer: PPO | Admitting: Physical Therapy

## 2016-04-22 ENCOUNTER — Encounter: Payer: Self-pay | Admitting: Family Medicine

## 2016-04-22 ENCOUNTER — Ambulatory Visit (INDEPENDENT_AMBULATORY_CARE_PROVIDER_SITE_OTHER): Payer: PPO | Admitting: Family Medicine

## 2016-04-22 VITALS — BP 116/62 | HR 63 | Temp 98.0°F | Ht 59.0 in | Wt 142.0 lb

## 2016-04-22 DIAGNOSIS — R2681 Unsteadiness on feet: Secondary | ICD-10-CM

## 2016-04-22 DIAGNOSIS — S60212D Contusion of left wrist, subsequent encounter: Secondary | ICD-10-CM

## 2016-04-22 DIAGNOSIS — L03114 Cellulitis of left upper limb: Secondary | ICD-10-CM

## 2016-04-22 DIAGNOSIS — I890 Lymphedema, not elsewhere classified: Secondary | ICD-10-CM | POA: Diagnosis not present

## 2016-04-22 DIAGNOSIS — L97901 Non-pressure chronic ulcer of unspecified part of unspecified lower leg limited to breakdown of skin: Secondary | ICD-10-CM

## 2016-04-22 DIAGNOSIS — M79661 Pain in right lower leg: Secondary | ICD-10-CM

## 2016-04-22 MED ORDER — CEFTRIAXONE SODIUM 1 G IJ SOLR
1.0000 g | Freq: Once | INTRAMUSCULAR | Status: AC
  Administered 2016-04-22: 1 g via INTRAMUSCULAR

## 2016-04-22 NOTE — Therapy (Signed)
[place], [name], [address] Phone: [phone]   Fax:  [phone]  Wound Care Therapy  Patient Details  Name: [name] MRN: [id number] Date of Birth: 1933/06/10 Referring Provider: [name]   Encounter Date: 04/22/2016      PT End of Session - 04/22/16 1157    Visit Number 41   Number of Visits 43   Date for PT Re-Evaluation 05/06/16   Authorization Type [employer] : g code and recert done at visit 79   Authorization - Visit Number 19   Authorization - Number of Visits 70   PT Start Time 0375   PT Stop Time 1118   PT Time Calculation (min) 43 min   Activity Tolerance Patient tolerated treatment well   Behavior During Therapy [facility] for tasks assessed/performed      Past Medical History:  Diagnosis Date  . Chronic lung disease    on home O2   . Complete heart block (HCC)    s/p PPM by Dr Rayann Heman  . Debilitated   . Diastolic dysfunction   . Hypertension   . Lymphedema of right lower extremity   . MI (mitral incompetence)   . Obesity   . Permanent atrial fibrillation ([place])   . Pulmonary hypertension   . Rheumatoid arthritis(714.0)   . Sleep apnea   . Stroke [facility])    remote  . Venous insufficiency    with chronic leg ulcers    Past Surgical History:  Procedure Laterality Date  . PACEMAKER INSERTION  08/13/10   SJM by Dr Rayann Heman    There were no vitals filed for this visit.                  Wound Therapy - 04/22/16 1152    Subjective Went to MD who placed her on antibiotics.  Lt wrist/hand not broken but may be cellulitis.   Pain Assessment No/denies pain   Evaluation and Treatment Procedures Explained to Patient/Family Yes   Evaluation and Treatment Procedures agreed to   Wound Properties Date First Assessed: 04/05/16 Time First Assessed: 1230 Wound Type: Venous stasis ulcer Location: Leg Location Orientation: Left Wound Description (Comments): small open  sore to above lateral ankle Present on Admission: Yes   Dressing Type Abdominal pads;Compression wrap;Gauze (Comment)   Dressing Changed Changed   Dressing Status Clean;Old drainage   Dressing Change Frequency Other (Comment)   Site / Wound Assessment Red   % Wound base Red or Granulating 100%   Peri-wound Assessment Intact;Hemosiderin;Pink;Other (Comment)  very dry, scaly   Margins Unattached edges (unapproximated)   Closure None   Drainage Amount Minimal   Drainage Description Serosanguineous   Treatment Cleansed;Debridement (Selective)   Wound Properties Date First Assessed: 04/05/16 Time First Assessed: 1831 Wound Type: Venous stasis ulcer Location: Leg Location Orientation: Right Wound Description (Comments): open ulcer around shin and calf Present on Admission: Yes   Dressing Type Compression wrap;Impregnated gauze (bismuth)   Dressing Changed Changed   Dressing Status Clean;Old drainage   Dressing Change Frequency Other (Comment)  2X week   Site / Wound Assessment Friable;Yellow;Red   % Wound base Red or Granulating 90%   % Wound base Yellow/Fibrinous Exudate 10%   Peri-wound Assessment Hemosiderin;Pink;Intact  friable   Margins Unattached edges (unapproximated)   Closure None   Drainage Amount Moderate   Drainage Description Serosanguineous;No odor   Treatment Cleansed;Debridement (Selective)   Selective Debridement - Location Bilateral LE  Selective Debridement - Tools Used Forceps   Selective Debridement - Tissue Removed slough and dry skin   Wound Therapy - Clinical Statement No green drainage present upon dressing removal on Rt., only serosanginous drainage present. Lt LE with scant drainage, however appears to be nearly healed this sesssion.  Anticipate next session Lt will be fully closed and ready to transition back to compression stocking.   Cleansed and moisturized both LE's well.  Continued with xeroform on Rt and profore on both.    Factors Delaying/Impairing  Wound Healing Infection - systemic/local;Multiple medical problems;Vascular compromise   Hydrotherapy Plan Debridement;Dressing change   Wound Therapy - Frequency Other (comment)   Wound Therapy - Current Recommendations --  2 times per week   Wound Therapy - Follow Up Recommendations --  pt has upcoming visit with vascular MD   Wound Plan Appropriate dressing and debridement.  Anticipate discharge of Lt LE next session.     Dressing  Lt:  lotion and profore   Dressing Rt:  xeroform, lotion and profore                   PT Short Term Goals - 04/10/16 1238      PT SHORT TERM GOAL #1   Title Pt to verbalize the importance of using compression every day to keep legs wound free   Time 1   Period Weeks   Status Achieved     PT SHORT TERM GOAL #2   Title Pt to state that the pain in her legs are no greater than 3/10 to allow pt to ambulate for five minutes without stopping    Time 2   Period Weeks   Status Achieved     PT SHORT TERM GOAL #3   Title Pt to have only 5 wounds on Rt LE and 2 on Lt LE    Time 2   Period Weeks   Status Achieved           PT Long Term Goals - 04/10/16 1238      PT LONG TERM GOAL #1   Title Pt to be wound free to reduce risk of infection    Time 4   Period Weeks   Status On-going     PT LONG TERM GOAL #2   Title Pt to have obtained compression garment and to be able to don and doff with minimal assist from family   has compression unable to use due to wounds    Time 4   Period Weeks   Status Partially Met     PT LONG TERM GOAL #3   Title Pt/ caregiver to understand and demonstrate correct wear time of garments and adhere to moisturizing LE's each night.     Time 4   Period Weeks   Status On-going             Patient will benefit from skilled therapeutic intervention in order to improve the following deficits and impairments:     Visit Diagnosis: Lymphedema of right lower extremity  Chronic ulcer of leg, limited to  breakdown of skin, unspecified laterality (HCC)  Pain in right lower leg  Unsteadiness on feet     Problem List Patient Active Problem List   Diagnosis Date Noted  . CAP (community acquired pneumonia) 04/04/2016  . Alzheimer's disease 10/11/2014  . Essential hypertension 10/11/2014  . Cardiomyopathy, dilated ([place]) 07/24/2014  . Apnea, sleep 07/24/2014  . Anemia 04/28/2014  . Chronic hypoxemic respiratory failure (  [place]) 04/12/2014  . Chronic ulcer of leg ([place]) 04/12/2014  . Lymphedema of leg 04/12/2014  . Disorder of nutrition 04/12/2014  . Hypothyroidism 03/24/2014  . Stasis dermatitis of both legs 05/06/2013  . Pacemaker-[employer] 11/25/2011  . Complete heart block ([place]) 12/12/2010  . Permanent atrial fibrillation ([place]) 12/12/2010  . Diastolic dysfunction 04/17/2246  . Venous insufficiency 12/12/2010  . Long term current use of anticoagulant 09/30/2002    Teena Irani, PTA/CLT [id number]  04/22/2016, 11:58 AM  [place] [address] [place], [name], [address] Phone: [phone]   Fax:  [phone]  Name: [name] MRN: [id number] Date of Birth: [birth year]

## 2016-04-22 NOTE — Progress Notes (Signed)
Subjective:  Patient ID: [name], female    DOB: January 18, 1934  Age: 83 y.o. MRN: [id number]  CC: Hand Pain (pt here today c/o left hand swelling even after starting antibioitc. The wrist is looking better but the hand isn't.)   HPI [name] presents for Continued swelling in the left hand. The antibiotic seems to have decreased the redness and swelling but it still significant on the hand. Patient has taken 4 days of antibiotic. She is on the fifth day today. She has another 9 days left on the course. She just wants to be sure that it is progressing as expected. Daughter requests an injectable antibiotic today to supplement as well. Vision says that the hand is sore. Daughter says she's been asking for pain medicine more. Patient states that the pain medicine is due to pain across the knuckles of the left hand. This is due to soreness even though she feels the pain is getting somewhat better.   History [name] has a past medical history of Chronic lung disease; Complete heart block ([place]); Debilitated; Diastolic dysfunction; Hypertension; Lymphedema of right lower extremity; MI (mitral incompetence); Obesity; Permanent atrial fibrillation ([place]); Pulmonary hypertension; Rheumatoid arthritis(714.0); Sleep apnea; Stroke [facility]); and Venous insufficiency.   She has a past surgical history that includes Pacemaker insertion (08/13/10).   Her family history is not on file.She reports that she has never smoked. She has never used smokeless tobacco. She reports that she does not drink alcohol or use drugs.  Current Outpatient Prescriptions on File Prior to Visit  Medication Sig Dispense Refill  . apixaban (ELIQUIS) 5 MG TABS tablet Take 1 tablet (5 mg total) by mouth 2 (two) times daily. (Patient taking differently: Take 5 mg by mouth every morning. ) 42 tablet 0  . ciprofloxacin (CIPRO) 250 MG tablet Take 1 tablet (250 mg total) by mouth 2 (two) times daily. Take all of these. 30 tablet 0  .  donepezil (ARICEPT) 10 MG tablet TAKE ONE (1) TABLET AT BEDTIME 90 tablet 1  . furosemide (LASIX) 40 MG tablet TAKE 1 TABLET EVERY MORNING AND TAKE 1/2 TABLET AT LUNCH (Patient taking differently: TAKE 1 TABLET EVERY MORNING) 135 tablet 1  . hydrocortisone (PROCTOZONE-HC) 2.5 % rectal cream Place 1 application rectally 2 (two) times daily. 30 g 0  . levothyroxine (SYNTHROID, LEVOTHROID) 50 MCG tablet TAKE ONE (1) TABLET EACH DAY 90 tablet 2  . memantine (NAMENDA) 10 MG tablet Take 1 tablet (10 mg total) by mouth 2 (two) times daily. 60 tablet 5  . Multiple Vitamins-Minerals (MULTIVITAMIN WITH MINERALS) tablet Take 1 tablet by mouth daily.    . OXYGEN-HELIUM IN Inhale 4 L into the lungs continuous. 4L    . simvastatin (ZOCOR) 20 MG tablet TAKE ONE (1) TABLET EACH DAY 90 tablet 0  . traMADol (ULTRAM) 50 MG tablet Take 1 tablet (50 mg total) by mouth 3 (three) times daily as needed for moderate pain. 60 tablet 3   No current facility-administered medications on file prior to visit.     ROS Review of Systems  Constitutional: Negative for fever.  HENT: Negative for congestion, rhinorrhea and sore throat.   Respiratory: Negative for cough and shortness of breath.   Cardiovascular: Negative for chest pain and palpitations.  Gastrointestinal: Negative for abdominal pain.  Musculoskeletal: Positive for arthralgias, joint swelling and myalgias.    Objective:  BP 116/62   Pulse 63   Temp 98 F (36.7 C) (Oral)   Ht 4\' 11"  (  1.499 m)   Wt 142 lb (64.4 kg)   BMI 28.68 kg/m   Physical Exam  Constitutional: She is oriented to person, place, and time. She appears well-developed and well-nourished. No distress.  HENT:  Head: Normocephalic and atraumatic.  Eyes: Conjunctivae are normal. Pupils are equal, round, and reactive to light.  Neck: Normal range of motion. Neck supple. No thyromegaly present.  Cardiovascular: Normal rate, regular rhythm and normal heart sounds.   No murmur  heard. Pulmonary/Chest: Effort normal and breath sounds normal. No respiratory distress. She has no wheezes. She has no rales.  Abdominal: Soft. Bowel sounds are normal. She exhibits no distension. There is no tenderness.  Musculoskeletal: Normal range of motion.  Lymphadenopathy:    She has no cervical adenopathy.  Neurological: She is alert and oriented to person, place, and time.  Skin: Skin is warm and dry. Rash (/Extensive dressings cover this stasis ulcerations of both lower extremities.) noted.  Hematoma previously noted over the cephalic vein at the left wrist has resolved. The dorsal left hand to the 2-3 MCP itself is 2+ edematous withmild erythema.   Psychiatric: She has a normal mood and affect. Her behavior is normal. Judgment and thought content normal.    Assessment & Plan:   Rubylee was seen today for hand pain.  Diagnoses and all orders for this visit:  Contusion of left wrist, subsequent encounter  Cellulitis of left hand -     cefTRIAXone (ROCEPHIN) injection 1 g; Inject 1 g into the muscle once.   I am having Ms. Illingworth maintain her OXYGEN-HELIUM IN, multivitamin with minerals, traMADol, levothyroxine, hydrocortisone, apixaban, furosemide, memantine, donepezil, simvastatin, and ciprofloxacin. We will continue to administer cefTRIAXone.  Meds ordered this encounter  Medications  . cefTRIAXone (ROCEPHIN) injection 1 g    Continue Cipro until gone. Follow-up should redness and swelling continued to spread. Follow-up: Return if symptoms worsen or fail to improve.  [name], M.D.

## 2016-04-23 DIAGNOSIS — J449 Chronic obstructive pulmonary disease, unspecified: Secondary | ICD-10-CM | POA: Diagnosis not present

## 2016-04-24 ENCOUNTER — Ambulatory Visit (HOSPITAL_COMMUNITY): Payer: PPO | Admitting: Physical Therapy

## 2016-04-24 DIAGNOSIS — L97901 Non-pressure chronic ulcer of unspecified part of unspecified lower leg limited to breakdown of skin: Secondary | ICD-10-CM

## 2016-04-24 DIAGNOSIS — I890 Lymphedema, not elsewhere classified: Secondary | ICD-10-CM | POA: Diagnosis not present

## 2016-04-24 DIAGNOSIS — M79661 Pain in right lower leg: Secondary | ICD-10-CM

## 2016-04-24 NOTE — Therapy (Signed)
[place], [name], [address] Phone: [phone]   Fax:  [phone]  Wound Care Therapy  Patient Details  Name: [name] MRN: [id number] Date of Birth: 07/08/1933 Referring Provider: [name]   Encounter Date: 04/24/2016      PT End of Session - 04/24/16 1207    Visit Number 42   Number of Visits 46   Date for PT Re-Evaluation 05/06/16   Authorization Type [employer] : g code and recert done at visit 29   Authorization - Visit Number 91   Authorization - Number of Visits 46   PT Start Time 1120   PT Stop Time 1155   PT Time Calculation (min) 35 min   Activity Tolerance Patient tolerated treatment well   Behavior During Therapy [facility] for tasks assessed/performed      Past Medical History:  Diagnosis Date  . Chronic lung disease    on home O2   . Complete heart block (HCC)    s/p PPM by Dr Rayann Heman  . Debilitated   . Diastolic dysfunction   . Hypertension   . Lymphedema of right lower extremity   . MI (mitral incompetence)   . Obesity   . Permanent atrial fibrillation ([place])   . Pulmonary hypertension   . Rheumatoid arthritis(714.0)   . Sleep apnea   . Stroke [facility])    remote  . Venous insufficiency    with chronic leg ulcers    Past Surgical History:  Procedure Laterality Date  . PACEMAKER INSERTION  08/13/10   SJM by Dr Rayann Heman    There were no vitals filed for this visit.                  Wound Therapy - 04/24/16 1200    Subjective Pt returned to MD after last visit and got a shot for the cellulitis in her LT hand.  Lt hand is now back to normal and no pain    Pain Assessment No/denies pain   Evaluation and Treatment Procedures Explained to Patient/Family Yes   Evaluation and Treatment Procedures agreed to   Wound Properties Date First Assessed: 04/05/16 Time First Assessed: 1552 Wound Type: Venous stasis ulcer Location: Leg Location Orientation: Left  Wound Description (Comments): small open sore to above lateral ankle Present on Admission: Yes Final Assessment Date: 04/24/16 Final Assessment Time: 1142   Margins --   Closure --   Drainage Amount --   Drainage Description --   Wound Properties Date First Assessed: 04/05/16 Time First Assessed: 0802 Wound Type: Venous stasis ulcer Location: Leg Location Orientation: Right Wound Description (Comments): open ulcer around shin and calf Present on Admission: Yes   Dressing Type Compression wrap;Impregnated gauze (bismuth)   Dressing Changed Changed   Dressing Status Clean;Old drainage   Dressing Change Frequency Other (Comment)  2X week   Site / Wound Assessment Friable;Yellow;Red   % Wound base Red or Granulating 95%   % Wound base Yellow/Fibrinous Exudate 5%   Peri-wound Assessment Hemosiderin;Pink;Intact  friable   Margins Unattached edges (unapproximated)   Closure None   Drainage Amount Minimal   Drainage Description Serosanguineous;No odor   Treatment Cleansed   Selective Debridement - Location --   Selective Debridement - Tools Used --   Selective Debridement - Tissue Removed --   Wound Therapy - Clinical Statement PT no longer has wounds on her LT LE but forgot her compression garment.  Therapist placed coban on  Lt LE so it would have some compression until pt and daughter get home.  Daughter states that she will place the compression on her mothers leg immediately.  RT LE wound decreased in size and dereased drainage.  Therapit feels for Rt LE to heal pt will need to stay on antibiotics as wound starts improving whenever pt is on antibiotics but as soon as she goes off the wound increases in size.  Daughter agrees.   Factors Delaying/Impairing Wound Healing Infection - systemic/local;Multiple medical problems;Vascular compromise   Hydrotherapy Plan Debridement;Dressing change   Wound Therapy - Frequency Other (comment)   Wound Therapy - Current Recommendations --  2 times per week    Wound Therapy - Follow Up Recommendations --  pt has upcoming visit with vascular MD   Wound Plan Appropriate dressing and debridement.  Anticipate discharge of Lt LE next session.     Dressing  --   Dressing Rt:  xeroform, lotion and profore                 PT Education - 04/24/16 1208    Education provided Yes   Education Details The importance of keeping compression on the Lt LE during the day but taking the compression garment off at night and redonning in the morning.    Person(s) Educated Patient   Methods Explanation   Comprehension Verbalized understanding          PT Short Term Goals - 04/24/16 1209      PT SHORT TERM GOAL #1   Title Pt to verbalize the importance of using compression every day to keep legs wound free   Time 1   Period Weeks   Status Achieved     PT SHORT TERM GOAL #2   Title Pt to state that the pain in her legs are no greater than 3/10 to allow pt to ambulate for five minutes without stopping    Time 2   Period Weeks   Status Achieved     PT SHORT TERM GOAL #3   Title Pt to have only 5 wounds on Rt LE and 2 on Lt LE    Time 2   Period Weeks   Status Achieved           PT Long Term Goals - 04/24/16 1209      PT LONG TERM GOAL #1   Title Pt to be wound free to reduce risk of infection    Time 4   Period Weeks   Status On-going     PT LONG TERM GOAL #2   Title Pt to have obtained compression garment and to be able to don and doff with minimal assist from family   has compression unable to use due to wounds    Time 4   Period Weeks   Status Partially Met     PT LONG TERM GOAL #3   Title Pt/ caregiver to understand and demonstrate correct wear time of garments and adhere to moisturizing LE's each night.     Time 4   Period Weeks   Status On-going               Plan - 04/24/16 1209    Rehab Potential Good   PT Frequency 2x / week   PT Duration 12 weeks  followed by one time a week for four weeks    PT  Treatment/Interventions ADLs/Self Care Home Management;Gait training;Stair training;Therapeutic exercise;Patient/family education;Other (comment)  debridement and multilayer compression  dressing    PT Next Visit Plan Discontinue treatment on Lt LE.  Remeasure Rt LE wounds next visit.       Patient will benefit from skilled therapeutic intervention in order to improve the following deficits and impairments:  Pain, Increased edema, Difficulty walking, Decreased balance, Other (comment) (nonhealing wound )  Visit Diagnosis: Lymphedema of right lower extremity  Chronic ulcer of leg, limited to breakdown of skin, unspecified laterality (HCC)  Pain in right lower leg     Problem List Patient Active Problem List   Diagnosis Date Noted  . CAP (community acquired pneumonia) 04/04/2016  . Alzheimer's disease 10/11/2014  . Essential hypertension 10/11/2014  . Cardiomyopathy, dilated ([place]) 07/24/2014  . Apnea, sleep 07/24/2014  . Anemia 04/28/2014  . Chronic hypoxemic respiratory failure ([place]) 04/12/2014  . Chronic ulcer of leg ([place]) 04/12/2014  . Lymphedema of leg 04/12/2014  . Disorder of nutrition 04/12/2014  . Hypothyroidism 03/24/2014  . Stasis dermatitis of both legs 05/06/2013  . Pacemaker-St.Jude 11/25/2011  . Complete heart block ([place]) 12/12/2010  . Permanent atrial fibrillation ([place]) 12/12/2010  . Diastolic dysfunction 59/97/7414  . Venous insufficiency 12/12/2010  . Long term current use of anticoagulant 09/30/2002    Rayetta Humphrey, PT CLT [phone] 04/24/2016, 12:11 PM  [place] [address] [place], [name], [address] Phone: [phone]   Fax:  [phone]  Name: ARLANDA SHIPLETT MRN: [id number] Date of Birth: 12/04/1933

## 2016-04-29 ENCOUNTER — Other Ambulatory Visit: Payer: Self-pay | Admitting: Pediatrics

## 2016-04-29 ENCOUNTER — Ambulatory Visit (HOSPITAL_COMMUNITY): Payer: PPO | Attending: Family Medicine | Admitting: Physical Therapy

## 2016-04-29 ENCOUNTER — Other Ambulatory Visit: Payer: Self-pay | Admitting: Family Medicine

## 2016-04-29 DIAGNOSIS — I872 Venous insufficiency (chronic) (peripheral): Secondary | ICD-10-CM | POA: Diagnosis not present

## 2016-04-29 DIAGNOSIS — R2681 Unsteadiness on feet: Secondary | ICD-10-CM | POA: Diagnosis not present

## 2016-04-29 DIAGNOSIS — I890 Lymphedema, not elsewhere classified: Secondary | ICD-10-CM | POA: Insufficient documentation

## 2016-04-29 DIAGNOSIS — M79661 Pain in right lower leg: Secondary | ICD-10-CM | POA: Insufficient documentation

## 2016-04-29 DIAGNOSIS — L97911 Non-pressure chronic ulcer of unspecified part of right lower leg limited to breakdown of skin: Secondary | ICD-10-CM | POA: Diagnosis not present

## 2016-04-29 DIAGNOSIS — L97901 Non-pressure chronic ulcer of unspecified part of unspecified lower leg limited to breakdown of skin: Secondary | ICD-10-CM | POA: Diagnosis not present

## 2016-04-29 NOTE — Therapy (Signed)
[place], [name], [address] Phone: [phone]   Fax:  [phone]  Wound Care Therapy  Patient Details  Name: [name] MRN: [id number] Date of Birth: [birth year] Referring Provider: [name]   Encounter Date: 04/29/2016      PT End of Session - 04/29/16 1149    Visit Number 43   Number of Visits 46   Date for PT Re-Evaluation 05/06/16   Authorization Type [employer] : g code and recert done at visit 87 ; cert good until 10/16/1658   Authorization - Visit Number 57   Authorization - Number of Visits 56   PT Start Time 6004   PT Stop Time 1115   PT Time Calculation (min) 38 min   Activity Tolerance Patient tolerated treatment well   Behavior During Therapy [facility] for tasks assessed/performed      Past Medical History:  Diagnosis Date  . Chronic lung disease    on home O2   . Complete heart block (HCC)    s/p PPM by Dr Rayann Heman  . Debilitated   . Diastolic dysfunction   . Hypertension   . Lymphedema of right lower extremity   . MI (mitral incompetence)   . Obesity   . Permanent atrial fibrillation ([place])   . Pulmonary hypertension   . Rheumatoid arthritis(714.0)   . Sleep apnea   . Stroke [facility])    remote  . Venous insufficiency    with chronic leg ulcers    Past Surgical History:  Procedure Laterality Date  . PACEMAKER INSERTION  08/13/10   SJM by Dr Rayann Heman    There were no vitals filed for this visit.                  Wound Therapy - 04/29/16 1143    Subjective Daughter states her mother is still on antibiotics.  Pt reports she is not having any pain today.    Pain Assessment No/denies pain   Evaluation and Treatment Procedures Explained to Patient/Family Yes   Evaluation and Treatment Procedures agreed to   Wound Properties Date First Assessed: 04/05/16 Time First Assessed: 5997 Wound Type: Venous stasis ulcer Location: Leg Location Orientation: Right Wound  Description (Comments): open ulcer around shin and calf Present on Admission: Yes   Dressing Type Compression wrap;Impregnated gauze (bismuth)   Dressing Changed Changed   Dressing Status Clean;Old drainage   Dressing Change Frequency Other (Comment)  2X week   Site / Wound Assessment Friable;Yellow;Red   % Wound base Red or Granulating 95%   % Wound base Yellow/Fibrinous Exudate 5%   Peri-wound Assessment Hemosiderin;Pink;Intact  friable   Margins Unattached edges (unapproximated)   Closure None   Drainage Amount Minimal   Drainage Description Serosanguineous;No odor   Treatment Cleansed;Debridement (Selective)   Selective Debridement - Location Rt LE   Selective Debridement - Tools Used Forceps;Other (comment)  gauze   Selective Debridement - Tissue Removed dry skin around perimeter and edges of wounds to promote approximation   Wound Therapy - Clinical Statement Inspected Lt LE and noticed garment was pulled up too high and heel area was around ankle.  Instructed daughter to make sure this is kept at the heel.  Also educated that specific stocking is too long for patient's LE and instructed to see about getting refitted for more appropriate size.  Wound is still granulated well, with increased hypergranulation as dressing is removed.  Cleansed and moistruized perimeter of LE  well and continued use of xeroform on wounds.  Profore was used for compression.     Factors Delaying/Impairing Wound Healing Infection - systemic/local;Multiple medical problems;Vascular compromise   Hydrotherapy Plan Debridement;Dressing change   Wound Therapy - Frequency Other (comment)   Wound Therapy - Current Recommendations --  2 times per week   Wound Therapy - Follow Up Recommendations --  pt has upcoming visit with vascular MD   Wound Plan Appropriate dressing and debridement for Rt LE only.  Monitor Lt LE weekly to ensure no returned wounds.  Follow up with new garment.    Dressing  Lt:  stocking is in  place   Dressing Rt:  xeroform, lotion and profore                   PT Short Term Goals - 04/24/16 1209      PT SHORT TERM GOAL #1   Title Pt to verbalize the importance of using compression every day to keep legs wound free   Time 1   Period Weeks   Status Achieved     PT SHORT TERM GOAL #2   Title Pt to state that the pain in her legs are no greater than 3/10 to allow pt to ambulate for five minutes without stopping    Time 2   Period Weeks   Status Achieved     PT SHORT TERM GOAL #3   Title Pt to have only 5 wounds on Rt LE and 2 on Lt LE    Time 2   Period Weeks   Status Achieved           PT Long Term Goals - 04/24/16 1209      PT LONG TERM GOAL #1   Title Pt to be wound free to reduce risk of infection    Time 4   Period Weeks   Status On-going     PT LONG TERM GOAL #2   Title Pt to have obtained compression garment and to be able to don and doff with minimal assist from family   has compression unable to use due to wounds    Time 4   Period Weeks   Status Partially Met     PT LONG TERM GOAL #3   Title Pt/ caregiver to understand and demonstrate correct wear time of garments and adhere to moisturizing LE's each night.     Time 4   Period Weeks   Status On-going             Patient will benefit from skilled therapeutic intervention in order to improve the following deficits and impairments:     Visit Diagnosis: Lymphedema of right lower extremity  Chronic ulcer of leg, limited to breakdown of skin, unspecified laterality (HCC)  Pain in right lower leg  Unsteadiness on feet     Problem List Patient Active Problem List   Diagnosis Date Noted  . CAP (community acquired pneumonia) 04/04/2016  . Alzheimer's disease 10/11/2014  . Essential hypertension 10/11/2014  . Cardiomyopathy, dilated ([place]) 07/24/2014  . Apnea, sleep 07/24/2014  . Anemia 04/28/2014  . Chronic hypoxemic respiratory failure ([place]) 04/12/2014  . Chronic  ulcer of leg ([place]) 04/12/2014  . Lymphedema of leg 04/12/2014  . Disorder of nutrition 04/12/2014  . Hypothyroidism 03/24/2014  . Stasis dermatitis of both legs 05/06/2013  . Pacemaker-St.Jude 11/25/2011  . Complete heart block ([place]) 12/12/2010  . Permanent atrial fibrillation ([place]) 12/12/2010  . Diastolic dysfunction 34/19/3790  .  Venous insufficiency 12/12/2010  . Long term current use of anticoagulant 09/30/2002    Teena Irani, PTA/CLT [phone]  04/29/2016, 11:51 AM  [place] [address] [place], [name], [address] Phone: [phone]   Fax:  [phone]  Name: [name] MRN: [id number] Date of Birth: 1933/07/09

## 2016-05-01 ENCOUNTER — Ambulatory Visit (HOSPITAL_COMMUNITY): Payer: PPO | Admitting: Physical Therapy

## 2016-05-01 DIAGNOSIS — I890 Lymphedema, not elsewhere classified: Secondary | ICD-10-CM | POA: Diagnosis not present

## 2016-05-01 DIAGNOSIS — R2681 Unsteadiness on feet: Secondary | ICD-10-CM

## 2016-05-01 DIAGNOSIS — M79661 Pain in right lower leg: Secondary | ICD-10-CM

## 2016-05-01 DIAGNOSIS — L97901 Non-pressure chronic ulcer of unspecified part of unspecified lower leg limited to breakdown of skin: Secondary | ICD-10-CM

## 2016-05-01 NOTE — Therapy (Signed)
[place], [name], [address] Phone: [phone]   Fax:  [phone]  Wound Care Therapy  Patient Details  Name: [name] MRN: [id number] Date of Birth: December 25, 1933 Referring Provider: [name]   Encounter Date: 05/01/2016      PT End of Session - 05/01/16 1346    Visit Number 44   Number of Visits 46   Date for PT Re-Evaluation 05/06/16   Authorization Type [employer] : g code and recert done at visit 32 ; cert good until 8/41/3244   Authorization - Visit Number 59   Authorization - Number of Visits 49   PT Start Time 0102   PT Stop Time 1120   PT Time Calculation (min) 45 min   Activity Tolerance Patient tolerated treatment well   Behavior During Therapy [facility] for tasks assessed/performed      Past Medical History:  Diagnosis Date  . Chronic lung disease    on home O2   . Complete heart block (HCC)    s/p PPM by Dr Rayann Heman  . Debilitated   . Diastolic dysfunction   . Hypertension   . Lymphedema of right lower extremity   . MI (mitral incompetence)   . Obesity   . Permanent atrial fibrillation ([place])   . Pulmonary hypertension   . Rheumatoid arthritis(714.0)   . Sleep apnea   . Stroke [facility])    remote  . Venous insufficiency    with chronic leg ulcers    Past Surgical History:  Procedure Laterality Date  . PACEMAKER INSERTION  08/13/10   SJM by Dr Rayann Heman    There were no vitals filed for this visit.                  Wound Therapy - 05/01/16 1340    Subjective Daughter reports her caregiver was giving her a bath this moring and her Lt Lower leg began to bleed.  No complications otherwise   Pain Assessment No/denies pain   Evaluation and Treatment Procedures Explained to Patient/Family Yes   Evaluation and Treatment Procedures agreed to   Wound Properties Date First Assessed: 04/05/16 Time First Assessed: 7253 Wound Type: Venous stasis ulcer Location: Leg  Location Orientation: Right Wound Description (Comments): open ulcer around shin and calf Present on Admission: Yes   Dressing Type Compression wrap;Impregnated gauze (bismuth)   Dressing Changed Changed   Dressing Status Clean;Old drainage   Dressing Change Frequency Other (Comment)  2X week   Site / Wound Assessment Friable;Yellow;Red   % Wound base Red or Granulating 95%   % Wound base Yellow/Fibrinous Exudate 5%   Peri-wound Assessment Hemosiderin;Pink;Intact  friable   Wound Length (cm) --  see descriptions/measurements in note   Margins Unattached edges (unapproximated)   Closure None   Drainage Amount Minimal   Drainage Description Serosanguineous;No odor   Treatment Cleansed;Debridement (Selective)   Selective Debridement - Location Rt LE   Selective Debridement - Tools Used Forceps;Other (comment)  gauze   Selective Debridement - Tissue Removed dry skin around perimeter and edges of wounds to promote approximation   Wound Therapy - Clinical Statement Rt LE dressing removed and wound measured by Azucena Freed, PT.  Inspected Lt LE with noted redness due to skin being thin, fragile and raw.  Recommended CG to gently wash LE and ensure LE is completely dry before donning garment.  Used xeroform over area and light layer of conform beneath the garment.  Pt  reported comfort.  Instructecd to ocnitnue to do this until area is no longer irritated.  Rt LE is decreasing in size with most medial wound now without hypergranulation.  Pt continues to remain on antibiotics.  Measured this session with measurements below.   Factors Delaying/Impairing Wound Healing Infection - systemic/local;Multiple medical problems;Vascular compromise   Hydrotherapy Plan Debridement;Dressing change   Wound Therapy - Frequency Other (comment)   Wound Therapy - Current Recommendations --  2 times per week   Wound Therapy - Follow Up Recommendations --  pt has upcoming visit with vascular MD   Wound Plan  Appropriate dressing and debridement for Rt LE only.  Inspect healing of Lt LE next session.  Monitor Lt LE weekly to ensure no returned wounds.  Re-evaluate X 2 more sessions   Dressing  Lt:  stocking is in place   Dressing Rt:  xeroform, lotion and profore       Wound measurements: Anterior, shape of horseshoe:  Lateral 10cm, bridge 6cm, medial 10.5 cm Lateral:  11X3 cm            PT Short Term Goals - 04/24/16 1209      PT SHORT TERM GOAL #1   Title Pt to verbalize the importance of using compression every day to keep legs wound free   Time 1   Period Weeks   Status Achieved     PT SHORT TERM GOAL #2   Title Pt to state that the pain in her legs are no greater than 3/10 to allow pt to ambulate for five minutes without stopping    Time 2   Period Weeks   Status Achieved     PT SHORT TERM GOAL #3   Title Pt to have only 5 wounds on Rt LE and 2 on Lt LE    Time 2   Period Weeks   Status Achieved           PT Long Term Goals - 04/24/16 1209      PT LONG TERM GOAL #1   Title Pt to be wound free to reduce risk of infection    Time 4   Period Weeks   Status On-going     PT LONG TERM GOAL #2   Title Pt to have obtained compression garment and to be able to don and doff with minimal assist from family   has compression unable to use due to wounds    Time 4   Period Weeks   Status Partially Met     PT LONG TERM GOAL #3   Title Pt/ caregiver to understand and demonstrate correct wear time of garments and adhere to moisturizing LE's each night.     Time 4   Period Weeks   Status On-going             Patient will benefit from skilled therapeutic intervention in order to improve the following deficits and impairments:     Visit Diagnosis: Lymphedema of right lower extremity  Chronic ulcer of leg, limited to breakdown of skin, unspecified laterality (HCC)  Pain in right lower leg  Unsteadiness on feet     Problem List Patient Active Problem  List   Diagnosis Date Noted  . CAP (community acquired pneumonia) 04/04/2016  . Alzheimer's disease 10/11/2014  . Essential hypertension 10/11/2014  . Cardiomyopathy, dilated ([place]) 07/24/2014  . Apnea, sleep 07/24/2014  . Anemia 04/28/2014  . Chronic hypoxemic respiratory failure ([place]) 04/12/2014  . Chronic ulcer of  leg ([place]) 04/12/2014  . Lymphedema of leg 04/12/2014  . Disorder of nutrition 04/12/2014  . Hypothyroidism 03/24/2014  . Stasis dermatitis of both legs 05/06/2013  . Pacemaker-St.Jude 11/25/2011  . Complete heart block ([place]) 12/12/2010  . Permanent atrial fibrillation ([place]) 12/12/2010  . Diastolic dysfunction 74/14/2395  . Venous insufficiency 12/12/2010  . Long term current use of anticoagulant 09/30/2002    Teena Irani, PTA/CLT [phone]  05/01/2016, 1:47 PM  [place] [address] [place], [name], [address] Phone: [phone]   Fax:  [phone]  Name: NIKITHA MODE MRN: [id number] Date of Birth: 1933/11/26

## 2016-05-06 ENCOUNTER — Ambulatory Visit (HOSPITAL_COMMUNITY): Payer: PPO | Admitting: Physical Therapy

## 2016-05-06 DIAGNOSIS — L97901 Non-pressure chronic ulcer of unspecified part of unspecified lower leg limited to breakdown of skin: Secondary | ICD-10-CM

## 2016-05-06 DIAGNOSIS — M79661 Pain in right lower leg: Secondary | ICD-10-CM

## 2016-05-06 DIAGNOSIS — R2681 Unsteadiness on feet: Secondary | ICD-10-CM

## 2016-05-06 DIAGNOSIS — I890 Lymphedema, not elsewhere classified: Secondary | ICD-10-CM

## 2016-05-06 NOTE — Therapy (Signed)
[place], [name], [address] Phone: [phone]   Fax:  [phone]  Wound Care Therapy  Patient Details  Name: [name] MRN: [id number] Date of Birth: 1933/11/20 Referring Provider: [name]   Encounter Date: 05/06/2016    Past Medical History:  Diagnosis Date  . Chronic lung disease    on home O2   . Complete heart block (HCC)    s/p PPM by Dr Rayann Heman  . Debilitated   . Diastolic dysfunction   . Hypertension   . Lymphedema of right lower extremity   . MI (mitral incompetence)   . Obesity   . Permanent atrial fibrillation ([place])   . Pulmonary hypertension   . Rheumatoid arthritis(714.0)   . Sleep apnea   . Stroke [facility])    remote  . Venous insufficiency    with chronic leg ulcers    Past Surgical History:  Procedure Laterality Date  . PACEMAKER INSERTION  08/13/10   SJM by Dr Rayann Heman    There were no vitals filed for this visit.                  Wound Therapy - 05/06/16 1217    Subjective daughter states she is doing better.  States the MD changed her antibiotic and she began it on Satturday.    Pain Assessment No/denies pain   Evaluation and Treatment Procedures Explained to Patient/Family Yes   Evaluation and Treatment Procedures agreed to   Wound Properties Date First Assessed: 04/05/16 Time First Assessed: 2841 Wound Type: Venous stasis ulcer Location: Leg Location Orientation: Right Wound Description (Comments): open ulcer around shin and calf Present on Admission: Yes   Dressing Type Compression wrap;Impregnated gauze (bismuth)   Dressing Changed Changed   Dressing Status Clean;Old drainage   Dressing Change Frequency Other (Comment)  2X week   Site / Wound Assessment Friable;Yellow;Red   % Wound base Red or Granulating 95%   % Wound base Yellow/Fibrinous Exudate 5%   Peri-wound Assessment Hemosiderin;Pink;Intact  friable   Margins Unattached edges  (unapproximated)   Closure None   Drainage Amount Minimal   Drainage Description Serosanguineous;No odor   Treatment Cleansed;Debridement (Selective)   Wound Therapy - Clinical Statement Wound with more irritation perimeter and increased hypergranulation.   discussed with daughter that she may want to contact MD regarding switching back to previous antibiotic as noted changes this session.  Daughter verbalized understanding.  Continued with same dressings this session.  No debridement needed, only cleansing and moisturizing prior to compression dressing.   Lt LE is looking good without wounds.  Educated daughter on hose (see education section).   Factors Delaying/Impairing Wound Healing Infection - systemic/local;Multiple medical problems;Vascular compromise   Hydrotherapy Plan Debridement;Dressing change   Wound Therapy - Frequency Other (comment)   Wound Therapy - Current Recommendations --  2 times per week   Wound Therapy - Follow Up Recommendations --  pt has upcoming visit with vascular MD   Wound Plan Appropriate dressing and debridement for Rt LE only. Measure next session.   Dressing  Lt:  stocking is in place   Dressing Rt:  xeroform, lotion and profore                 PT Education - 05/06/16 1459    Education provided Yes   Education Details Stocking care on Lt LE.  postioning of garment so heel is placed in correct area, not to pull it up  so high and bunch it on top.  Moisturize in eveneings when garment is removed (has been doing it prior to donning garment in the morning)   Person(s) Educated Patient;Child(ren)   Methods Explanation   Comprehension Verbalized understanding          PT Short Term Goals - 04/24/16 1209      PT SHORT TERM GOAL #1   Title Pt to verbalize the importance of using compression every day to keep legs wound free   Time 1   Period Weeks   Status Achieved     PT SHORT TERM GOAL #2   Title Pt to state that the pain in her legs are no  greater than 3/10 to allow pt to ambulate for five minutes without stopping    Time 2   Period Weeks   Status Achieved     PT SHORT TERM GOAL #3   Title Pt to have only 5 wounds on Rt LE and 2 on Lt LE    Time 2   Period Weeks   Status Achieved           PT Long Term Goals - 04/24/16 1209      PT LONG TERM GOAL #1   Title Pt to be wound free to reduce risk of infection    Time 4   Period Weeks   Status On-going     PT LONG TERM GOAL #2   Title Pt to have obtained compression garment and to be able to don and doff with minimal assist from family   has compression unable to use due to wounds    Time 4   Period Weeks   Status Partially Met     PT LONG TERM GOAL #3   Title Pt/ caregiver to understand and demonstrate correct wear time of garments and adhere to moisturizing LE's each night.     Time 4   Period Weeks   Status On-going             Patient will benefit from skilled therapeutic intervention in order to improve the following deficits and impairments:     Visit Diagnosis: Lymphedema of right lower extremity  Chronic ulcer of leg, limited to breakdown of skin, unspecified laterality (HCC)  Pain in right lower leg  Unsteadiness on feet     Problem List Patient Active Problem List   Diagnosis Date Noted  . CAP (community acquired pneumonia) 04/04/2016  . Alzheimer's disease 10/11/2014  . Essential hypertension 10/11/2014  . Cardiomyopathy, dilated ([place]) 07/24/2014  . Apnea, sleep 07/24/2014  . Anemia 04/28/2014  . Chronic hypoxemic respiratory failure ([place]) 04/12/2014  . Chronic ulcer of leg ([place]) 04/12/2014  . Lymphedema of leg 04/12/2014  . Disorder of nutrition 04/12/2014  . Hypothyroidism 03/24/2014  . Stasis dermatitis of both legs 05/06/2013  . Pacemaker-St.Jude 11/25/2011  . Complete heart block ([place]) 12/12/2010  . Permanent atrial fibrillation ([place]) 12/12/2010  . Diastolic dysfunction 28/31/5176  . Venous insufficiency  12/12/2010  . Long term current use of anticoagulant 09/30/2002    Teena Irani, PTA/CLT [phone]  05/06/2016, 3:01 PM  [place] [address] [place], [name], [address] Phone: [phone]   Fax:  [phone]  Name: [name] MRN: [id number] Date of Birth: 02-15-1933

## 2016-05-08 ENCOUNTER — Ambulatory Visit (HOSPITAL_COMMUNITY): Payer: PPO | Admitting: Physical Therapy

## 2016-05-08 DIAGNOSIS — M79661 Pain in right lower leg: Secondary | ICD-10-CM

## 2016-05-08 DIAGNOSIS — L97901 Non-pressure chronic ulcer of unspecified part of unspecified lower leg limited to breakdown of skin: Secondary | ICD-10-CM

## 2016-05-08 DIAGNOSIS — I890 Lymphedema, not elsewhere classified: Secondary | ICD-10-CM | POA: Diagnosis not present

## 2016-05-08 NOTE — Therapy (Signed)
[place], [name], [address] Phone: [phone]   Fax:  [phone]  Wound Care Therapy  Patient Details  Name: [name] MRN: [id number] Date of Birth: 1933-07-24 Referring Provider: [name]   Encounter Date: 05/08/2016      PT End of Session - 05/08/16 1155    Visit Number 45   Number of Visits 49   Date for PT Re-Evaluation 05/06/16   Authorization Type [employer] : g code and recert done at visit 41 ; cert good until 9/4//8016   Authorization - Visit Number 101   Authorization - Number of Visits 56   PT Start Time 1036   PT Stop Time 1110   PT Time Calculation (min) 34 min   Activity Tolerance Patient tolerated treatment well   Behavior During Therapy [facility] for tasks assessed/performed      Past Medical History:  Diagnosis Date  . Chronic lung disease    on home O2   . Complete heart block (HCC)    s/p PPM by Dr Rayann Heman  . Debilitated   . Diastolic dysfunction   . Hypertension   . Lymphedema of right lower extremity   . MI (mitral incompetence)   . Obesity   . Permanent atrial fibrillation ([place])   . Pulmonary hypertension   . Rheumatoid arthritis(714.0)   . Sleep apnea   . Stroke [facility])    remote  . Venous insufficiency    with chronic leg ulcers    Past Surgical History:  Procedure Laterality Date  . PACEMAKER INSERTION  08/13/10   SJM by Dr Rayann Heman    There were no vitals filed for this visit.       Subjective Assessment - 05/08/16 1133    Subjective Pt states that she is not having any pain.    Pertinent History COPD with oxygen dependency (4L), HTN, RA, CVA, venous insufficency with chronic leg wounds, lymphedema.   How long can you sit comfortably? no problem    How long can you stand comfortably? less than five minutes   How long can you walk comfortably? less than five minutes    Currently in Pain? No/denies                   Wound Therapy  - 05/08/16 1136    Subjective Pt continues to be on antibiotics.     Pain Assessment No/denies pain   Evaluation and Treatment Procedures Explained to Patient/Family Yes   Evaluation and Treatment Procedures agreed to   Wound Properties Date First Assessed: 04/05/16 Time First Assessed: 5537 Wound Type: Venous stasis ulcer Location: Leg Location Orientation: Right Wound Description (Comments): open ulcer around shin and calf Present on Admission: Yes   Dressing Type Compression wrap;Impregnated gauze (bismuth)   Dressing Changed Changed   Dressing Status Clean;Old drainage  increased drainage.   Dressing Change Frequency Other (Comment)  2X week   Site / Wound Assessment Friable;Red   % Wound base Red or Granulating --  99%   % Wound base Yellow/Fibrinous Exudate --  1%   Peri-wound Assessment Intact;Erythema (blanchable)  friable   Wound Length (cm) --  Lengthof horseshoe 10 cm was 10; top width 7 was 6; width of sides of wound  4 cm  was 3 cm   Wound Width (cm) --  lateral wound now 13x5 was 11 x 3 cm.     Wound Depth (cm) --  2 new wounds  one distal 1.2x1.1; the other is medial .8x.5    Margins Unattached edges (unapproximated)   Closure None   Drainage Amount Minimal   Drainage Description Serosanguineous;No odor   Treatment Cleansed  multilayer compression bandaging    Wound Therapy - Clinical Statement LE has increased redness, wounds have increased in size, increased in number and increased in drainage.  Spoke to daughter about possibly needing a new antibiotic.  Wound is clear of any slough or dry skin therefore pt can be discharged to home health nursing for dressing changes.  Pt is 82, on O2, uses a walker and lives 30 minutes from this facility.  Both she and her family would benefit from home health rather than out patient at this time.  Pt daugher is going to call MD re wounds increasing in size and might need an antibiotic and the possibilty of getting home health  services ordered.    Factors Delaying/Impairing Wound Healing Infection - systemic/local;Multiple medical problems;Vascular compromise   Hydrotherapy Plan Debridement;Dressing change   Wound Therapy - Frequency Other (comment)   Wound Therapy - Current Recommendations --  2 times per week until pt can get home health services    Wound Therapy - Follow Up Recommendations --  pt has upcoming visit with vascular MD   Wound Plan continue with cleansing and multilayer compression wrapping    Dressing  Lt:  stocking is in place   Dressing Rt:  xeroform, ab pad and profore                 PT Education - 05/08/16 1153    Education provided Yes   Education Details Wound is increasing in size and number; the possibility of needing a new antibiotic, and the benefit of home health nursing at this time to take care of the wound.l   Person(s) Educated Patient   Methods Explanation   Comprehension Verbalized understanding          PT Short Term Goals - 05/08/16 1157      PT SHORT TERM GOAL #1   Title Pt to verbalize the importance of using compression every day to keep legs wound free   Time 1   Period Weeks   Status Achieved     PT SHORT TERM GOAL #2   Title Pt to state that the pain in her legs are no greater than 3/10 to allow pt to ambulate for five minutes without stopping    Time 2   Period Weeks   Status Achieved     PT SHORT TERM GOAL #3   Title Pt to have only 5 wounds on Rt LE and 2 on Lt LE    Time 2   Period Weeks   Status Achieved           PT Long Term Goals - 05/08/16 1158      PT LONG TERM GOAL #1   Title Pt to be wound free to reduce risk of infection    Time 4   Period Weeks   Status On-going     PT LONG TERM GOAL #2   Title Pt to have obtained compression garment and to be able to don and doff with minimal assist from family   has compression unable to use due to wounds    Time 4   Period Weeks   Status Partially Met     PT LONG TERM GOAL  #3   Title Pt/ caregiver to understand and demonstrate correct  wear time of garments and adhere to moisturizing LE's each night.     Time 4   Period Weeks   Status Achieved               Plan - 05/08/16 1157    Clinical Impression Statement see above    Rehab Potential Good   PT Frequency 2x / week   PT Duration 12 weeks  followed by one time a week for four weeks    PT Treatment/Interventions ADLs/Self Care Home Management;Gait training;Stair training;Therapeutic exercise;Patient/family education;Other (comment)  debridement and multilayer compression dressing    PT Next Visit Plan see above       Patient will benefit from skilled therapeutic intervention in order to improve the following deficits and impairments:  Pain, Increased edema, Difficulty walking, Decreased balance, Other (comment) (nonhealing wound )  Visit Diagnosis: Lymphedema of right lower extremity  Chronic ulcer of leg, limited to breakdown of skin, unspecified laterality (HCC)  Pain in right lower leg     Problem List Patient Active Problem List   Diagnosis Date Noted  . CAP (community acquired pneumonia) 04/04/2016  . Alzheimer's disease 10/11/2014  . Essential hypertension 10/11/2014  . Cardiomyopathy, dilated ([place]) 07/24/2014  . Apnea, sleep 07/24/2014  . Anemia 04/28/2014  . Chronic hypoxemic respiratory failure ([place]) 04/12/2014  . Chronic ulcer of leg ([place]) 04/12/2014  . Lymphedema of leg 04/12/2014  . Disorder of nutrition 04/12/2014  . Hypothyroidism 03/24/2014  . Stasis dermatitis of both legs 05/06/2013  . Pacemaker-St.Jude 11/25/2011  . Complete heart block ([place]) 12/12/2010  . Permanent atrial fibrillation ([place]) 12/12/2010  . Diastolic dysfunction 24/17/5301  . Venous insufficiency 12/12/2010  . Long term current use of anticoagulant 09/30/2002    Rayetta Humphrey, PT CLT [phone] 05/08/2016, 11:59 AM  [facility] [address] [place], [name], [address] Phone: [phone]   Fax:  [phone]  Name: [name] MRN: [id number] Date of Birth: 1933/03/08

## 2016-05-09 ENCOUNTER — Encounter: Payer: Self-pay | Admitting: Family Medicine

## 2016-05-13 ENCOUNTER — Ambulatory Visit (HOSPITAL_COMMUNITY): Payer: PPO | Admitting: Physical Therapy

## 2016-05-13 DIAGNOSIS — I890 Lymphedema, not elsewhere classified: Secondary | ICD-10-CM

## 2016-05-13 DIAGNOSIS — R2681 Unsteadiness on feet: Secondary | ICD-10-CM

## 2016-05-13 DIAGNOSIS — M79661 Pain in right lower leg: Secondary | ICD-10-CM

## 2016-05-13 DIAGNOSIS — L97901 Non-pressure chronic ulcer of unspecified part of unspecified lower leg limited to breakdown of skin: Secondary | ICD-10-CM

## 2016-05-13 NOTE — Therapy (Signed)
[place], [name], [address] Phone: [phone]   Fax:  [phone]  Wound Care Therapy  Patient Details  Name: [name] MRN: [id number] Date of Birth: 1933/03/05 Referring Provider: [name]   Encounter Date: 05/13/2016      PT End of Session - 05/13/16 1224    Visit Number 90   Number of Visits 49   Date for PT Re-Evaluation 05/06/16   Authorization Type [employer] : g code and recert done at visit 1 ; cert good until 3/1//4970   Authorization - Visit Number 74   Authorization - Number of Visits 30   PT Start Time 2637   PT Stop Time 1105   PT Time Calculation (min) 25 min   Activity Tolerance Patient tolerated treatment well   Behavior During Therapy [facility] for tasks assessed/performed      Past Medical History:  Diagnosis Date  . Chronic lung disease    on home O2   . Complete heart block (HCC)    s/p PPM by Dr Rayann Heman  . Debilitated   . Diastolic dysfunction   . Hypertension   . Lymphedema of right lower extremity   . MI (mitral incompetence)   . Obesity   . Permanent atrial fibrillation ([place])   . Pulmonary hypertension   . Rheumatoid arthritis(714.0)   . Sleep apnea   . Stroke [facility])    remote  . Venous insufficiency    with chronic leg ulcers    Past Surgical History:  Procedure Laterality Date  . PACEMAKER INSERTION  08/13/10   SJM by Dr Rayann Heman    There were no vitals filed for this visit.                  Wound Therapy - 05/13/16 1221    Subjective Daugher states she has left messages for MD regarding home health dressing changes and antibiotic questions.   Pain Assessment No/denies pain   Evaluation and Treatment Procedures Explained to Patient/Family Yes   Evaluation and Treatment Procedures agreed to   Wound Properties Date First Assessed: 04/05/16 Time First Assessed: 8588 Wound Type: Venous stasis ulcer Location: Leg Location Orientation:  Right Wound Description (Comments): open ulcer around shin and calf Present on Admission: Yes   Dressing Type Compression wrap;Impregnated gauze (bismuth)   Dressing Changed Changed   Dressing Status Clean;Old drainage  increased drainage.   Dressing Change Frequency Other (Comment)  2X week   Site / Wound Assessment Friable;Red   % Wound base Red or Granulating --  99%   % Wound base Yellow/Fibrinous Exudate --  1%   Peri-wound Assessment Intact;Erythema (blanchable)  friable   Margins Unattached edges (unapproximated)   Closure None   Drainage Amount Minimal   Drainage Description Serosanguineous;No odor   Treatment Cleansed;Debridement (Selective)  edges of wound with gauze to remove dry skin   Selective Debridement - Location Rt LE   Selective Debridement - Tools Used Other (comment)  gauze   Selective Debridement - Tissue Removed dry skin around perimeter and edges of wounds to promote approximation   Wound Therapy - Clinical Statement No significant change noted in wound bed or perimeter of wound.  No infection present, continues to be hypergranulated.  Xeroform seems to be keeping appropriate moisture and healing environment.  Continued with profore for adequate compression.     Factors Delaying/Impairing Wound Healing Infection - systemic/local;Multiple medical problems;Vascular compromise   Hydrotherapy Plan Debridement;Dressing change  Wound Therapy - Frequency Other (comment)   Wound Therapy - Current Recommendations --  2 times per week until pt can get home health services    Wound Therapy - Follow Up Recommendations --  pt has upcoming visit with vascular MD   Wound Plan continue with cleansing and multilayer compression wrapping.  Follow up with transitioning to home health for dressing changes as no debridment needed at this point.    Dressing  Lt:  stocking is in place   Dressing Rt:  xeroform, ab pad and profore                   PT Short Term Goals  - 05/08/16 1157      PT SHORT TERM GOAL #1   Title Pt to verbalize the importance of using compression every day to keep legs wound free   Time 1   Period Weeks   Status Achieved     PT SHORT TERM GOAL #2   Title Pt to state that the pain in her legs are no greater than 3/10 to allow pt to ambulate for five minutes without stopping    Time 2   Period Weeks   Status Achieved     PT SHORT TERM GOAL #3   Title Pt to have only 5 wounds on Rt LE and 2 on Lt LE    Time 2   Period Weeks   Status Achieved           PT Long Term Goals - 05/08/16 1158      PT LONG TERM GOAL #1   Title Pt to be wound free to reduce risk of infection    Time 4   Period Weeks   Status On-going     PT LONG TERM GOAL #2   Title Pt to have obtained compression garment and to be able to don and doff with minimal assist from family   has compression unable to use due to wounds    Time 4   Period Weeks   Status Partially Met     PT LONG TERM GOAL #3   Title Pt/ caregiver to understand and demonstrate correct wear time of garments and adhere to moisturizing LE's each night.     Time 4   Period Weeks   Status Achieved             Patient will benefit from skilled therapeutic intervention in order to improve the following deficits and impairments:     Visit Diagnosis: Lymphedema of right lower extremity  Chronic ulcer of leg, limited to breakdown of skin, unspecified laterality (HCC)  Pain in right lower leg  Unsteadiness on feet     Problem List Patient Active Problem List   Diagnosis Date Noted  . CAP (community acquired pneumonia) 04/04/2016  . Alzheimer's disease 10/11/2014  . Essential hypertension 10/11/2014  . Cardiomyopathy, dilated ([place]) 07/24/2014  . Apnea, sleep 07/24/2014  . Anemia 04/28/2014  . Chronic hypoxemic respiratory failure ([place]) 04/12/2014  . Chronic ulcer of leg ([place]) 04/12/2014  . Lymphedema of leg 04/12/2014  . Disorder of nutrition 04/12/2014  .  Hypothyroidism 03/24/2014  . Stasis dermatitis of both legs 05/06/2013  . Pacemaker-St.Jude 11/25/2011  . Complete heart block ([place]) 12/12/2010  . Permanent atrial fibrillation ([place]) 12/12/2010  . Diastolic dysfunction 53/66/4403  . Venous insufficiency 12/12/2010  . Long term current use of anticoagulant 09/30/2002    Teena Irani, PTA/CLT [phone]  05/13/2016, 12:25 PM  [place], [name], [address] Phone: [phone]   Fax:  [phone]  Name: [name] MRN: [id number] Date of Birth: 07-24-1933

## 2016-05-15 ENCOUNTER — Ambulatory Visit (HOSPITAL_COMMUNITY): Payer: PPO | Admitting: Physical Therapy

## 2016-05-15 DIAGNOSIS — I872 Venous insufficiency (chronic) (peripheral): Secondary | ICD-10-CM

## 2016-05-15 DIAGNOSIS — I890 Lymphedema, not elsewhere classified: Secondary | ICD-10-CM | POA: Diagnosis not present

## 2016-05-15 DIAGNOSIS — L97911 Non-pressure chronic ulcer of unspecified part of right lower leg limited to breakdown of skin: Secondary | ICD-10-CM

## 2016-05-15 NOTE — Therapy (Signed)
[place], [name], [address] Phone: [phone]   Fax:  [phone]  Wound Care Therapy  Patient Details  Name: [name] MRN: [id number] Date of Birth: [birth year] Referring Provider: [name]   Encounter Date: 05/15/2016      PT End of Session - 05/15/16 1113    Visit Number 19   Number of Visits 49   Date for PT Re-Evaluation 05/06/16   Authorization Type [employer] : g code and recert done at visit 42 ; cert good until 9/5//2841   Authorization - Visit Number 45   Authorization - Number of Visits 60   PT Start Time 3244   PT Stop Time 1106   PT Time Calculation (min) 21 min   Activity Tolerance Patient tolerated treatment well   Behavior During Therapy [facility] for tasks assessed/performed      Past Medical History:  Diagnosis Date  . Chronic lung disease    on home O2   . Complete heart block (HCC)    s/p PPM by Dr Rayann Heman  . Debilitated   . Diastolic dysfunction   . Hypertension   . Lymphedema of right lower extremity   . MI (mitral incompetence)   . Obesity   . Permanent atrial fibrillation ([place])   . Pulmonary hypertension   . Rheumatoid arthritis(714.0)   . Sleep apnea   . Stroke [facility])    remote  . Venous insufficiency    with chronic leg ulcers    Past Surgical History:  Procedure Laterality Date  . PACEMAKER INSERTION  08/13/10   SJM by Dr Rayann Heman    There were no vitals filed for this visit.                  Wound Therapy - 05/15/16 1107    Subjective Daughter states pt has appointment tomorrow with MD for consideration of home health for wound care.   Evaluation and Treatment Procedures Explained to Patient/Family Yes   Evaluation and Treatment Procedures agreed to   Wound Properties Date First Assessed: 04/05/16 Time First Assessed: 0102 Wound Type: Venous stasis ulcer Location: Leg Location Orientation: Right Wound Description (Comments): open  ulcer around shin and calf Present on Admission: Yes   Dressing Type Compression wrap;Impregnated gauze (bismuth)   Dressing Changed Changed   Dressing Status Clean;Old drainage  increased drainage.   Dressing Change Frequency Other (Comment)  2X week   Site / Wound Assessment Friable;Red   % Wound base Red or Granulating --  99%   % Wound base Yellow/Fibrinous Exudate --  1%   Peri-wound Assessment Intact;Erythema (blanchable)  friable   Margins Unattached edges (unapproximated)   Closure None   Drainage Amount Minimal   Drainage Description Serosanguineous;No odor   Treatment Cleansed   Wound Therapy - Clinical Statement Pt to be changed to home health services as wound needs dressing change only. Pt is 83 yo on constant 02 and uses a wheelchair.  Sheo only goes out for MD visits therefore she is more appropriate for [facility] nursing for wound care than physical therapy.  No significant change noted in wound bed or perimeter of wound.  No infection present, continues to be hypergranulated.  Xeroform seems to be keeping appropriate moisture and healing environment.  Continued with profore for adequate compression.     Factors Delaying/Impairing Wound Healing Infection - systemic/local;Multiple medical problems;Vascular compromise   Hydrotherapy Plan Debridement;Dressing change   Wound Therapy - Frequency  Other (comment)   Wound Therapy - Current Recommendations --  2 times per week until pt can get home health services    Wound Plan continue with cleansing and multilayer compression wrapping.  Follow up with transitioning to home health for dressing changes as no debridment needed at this point.    Dressing  Lt:  stocking is in place and per daughter the leg is wound free at this time.    Dressing Rt:  xeroform, and profore                 PT Education - 05/15/16 1111    Education provided Yes   Education Details The importance of keeping blood sugars at proper levels.  156  today.  Also to have home health nurse place xeroform over wounds prior to applying profore so that the wounds do not dry out.    Person(s) Educated Child(ren)   Methods Explanation;Demonstration   Comprehension Verbalized understanding          PT Short Term Goals - 05/08/16 1157      PT SHORT TERM GOAL #1   Title Pt to verbalize the importance of using compression every day to keep legs wound free   Time 1   Period Weeks   Status Achieved     PT SHORT TERM GOAL #2   Title Pt to state that the pain in her legs are no greater than 3/10 to allow pt to ambulate for five minutes without stopping    Time 2   Period Weeks   Status Achieved     PT SHORT TERM GOAL #3   Title Pt to have only 5 wounds on Rt LE and 2 on Lt LE    Time 2   Period Weeks   Status Achieved           PT Long Term Goals - 05/08/16 1158      PT LONG TERM GOAL #1   Title Pt to be wound free to reduce risk of infection    Time 4   Period Weeks   Status On-going     PT LONG TERM GOAL #2   Title Pt to have obtained compression garment and to be able to don and doff with minimal assist from family   has compression unable to use due to wounds    Time 4   Period Weeks   Status Partially Met     PT LONG TERM GOAL #3   Title Pt/ caregiver to understand and demonstrate correct wear time of garments and adhere to moisturizing LE's each night.     Time 4   Period Weeks   Status Achieved               Plan - 05/15/16 1114    Rehab Potential Good   PT Frequency 2x / week   PT Duration 12 weeks  followed by one time a week for four weeks    PT Treatment/Interventions ADLs/Self Care Home Management;Gait training;Stair training;Therapeutic exercise;Patient/family education;Other (comment)  debridement and multilayer compression dressing    PT Next Visit Plan see above       Patient will benefit from skilled therapeutic intervention in order to improve the following deficits and impairments:   Pain, Increased edema, Difficulty walking, Decreased balance, Other (comment) (nonhealing wound )  Visit Diagnosis: Chronic ulcer of right leg, limited to breakdown of skin ([place])  Venous insufficiency     Problem List Patient Active Problem List  Diagnosis Date Noted  . CAP (community acquired pneumonia) 04/04/2016  . Alzheimer's disease 10/11/2014  . Essential hypertension 10/11/2014  . Cardiomyopathy, dilated ([place]) 07/24/2014  . Apnea, sleep 07/24/2014  . Anemia 04/28/2014  . Chronic hypoxemic respiratory failure ([place]) 04/12/2014  . Chronic ulcer of leg ([place]) 04/12/2014  . Lymphedema of leg 04/12/2014  . Disorder of nutrition 04/12/2014  . Hypothyroidism 03/24/2014  . Stasis dermatitis of both legs 05/06/2013  . Pacemaker-St.Jude 11/25/2011  . Complete heart block ([place]) 12/12/2010  . Permanent atrial fibrillation ([place]) 12/12/2010  . Diastolic dysfunction 55/73/2202  . Venous insufficiency 12/12/2010  . Long term current use of anticoagulant 09/30/2002    Rayetta Humphrey, PT CLT [phone] 05/15/2016, 11:15 AM  [place] [address] [place], [name], [address] Phone: [phone]   Fax:  [phone]  Name: [name] MRN: [id number] Date of Birth: 1933-11-30

## 2016-05-16 ENCOUNTER — Other Ambulatory Visit: Payer: Self-pay | Admitting: Family Medicine

## 2016-05-16 ENCOUNTER — Encounter: Payer: Self-pay | Admitting: Family Medicine

## 2016-05-16 ENCOUNTER — Ambulatory Visit (INDEPENDENT_AMBULATORY_CARE_PROVIDER_SITE_OTHER): Payer: PPO | Admitting: Family Medicine

## 2016-05-16 VITALS — BP 118/64 | HR 77 | Temp 98.7°F | Ht 59.0 in | Wt 138.4 lb

## 2016-05-16 DIAGNOSIS — I83028 Varicose veins of left lower extremity with ulcer other part of lower leg: Secondary | ICD-10-CM

## 2016-05-16 DIAGNOSIS — L97825 Non-pressure chronic ulcer of other part of left lower leg with muscle involvement without evidence of necrosis: Secondary | ICD-10-CM | POA: Diagnosis not present

## 2016-05-16 MED ORDER — AMOXICILLIN-POT CLAVULANATE 875-125 MG PO TABS: 1.0000 | ORAL_TABLET | Freq: Two times a day (BID) | ORAL | 0 refills | Status: DC

## 2016-05-16 NOTE — Progress Notes (Signed)
Subjective:  Patient ID: [name], female    DOB: [birth year]  Age: 83 y.o. MRN: [id number]  CC: Venous Stasis Ulcer (pt here today following up on her right leg. Wound care states she needs [place] to put in a home health referral.)   HPI [name] presents for follow up of venoius stasis. Wound under care of wound center. Pt to be discharged but still has extensive ulcers on the left leg . Daughter concerned that help needed. Requests referral to home health. No fever. Wounds painful.    History Ariah has a past medical history of Chronic lung disease; Complete heart block ([place]); Debilitated; Diastolic dysfunction; Hypertension; Lymphedema of right lower extremity; MI (mitral incompetence); Obesity; Permanent atrial fibrillation ([place]); Pulmonary hypertension ([place]); Rheumatoid arthritis(714.0); Sleep apnea; Stroke [facility]); and Venous insufficiency.   She has a past surgical history that includes Pacemaker insertion (08/13/10).   Her family history is not on file.She reports that she has never smoked. She has never used smokeless tobacco. She reports that she does not drink alcohol or use drugs.    ROS Review of Systems  Constitutional: Negative for activity change, appetite change and fever.  HENT: Negative for congestion, rhinorrhea and sore throat.   Eyes: Negative for visual disturbance.  Respiratory: Negative for cough and shortness of breath.   Cardiovascular: Negative for chest pain and palpitations.  Gastrointestinal: Negative for abdominal pain, diarrhea and nausea.  Genitourinary: Negative for dysuria.  Musculoskeletal: Negative for arthralgias and myalgias.  Skin: Positive for wound.    Objective:  BP 118/64   Pulse 77   Temp 98.7 F (37.1 C) (Oral)   Ht 4\' 11"  (1.499 m)   Wt 138 lb 6 oz (62.8 kg)   BMI 27.95 kg/m   BP Readings from Last 3 Encounters:  05/16/16 118/64  04/22/16 116/62  04/17/16 116/80    Wt Readings from Last 3 Encounters:    05/16/16 138 lb 6 oz (62.8 kg)  04/22/16 142 lb (64.4 kg)  04/17/16 139 lb (63 kg)     Physical Exam  Constitutional: She appears well-developed and well-nourished. No distress.  HENT:  Head: Normocephalic and atraumatic.  Eyes: Conjunctivae are normal. Pupils are equal, round, and reactive to light.  Neck: Normal range of motion. Neck supple. No thyromegaly present.  Cardiovascular: Normal rate, regular rhythm and normal heart sounds.   No murmur heard. Pulmonary/Chest: Effort normal and breath sounds normal. No respiratory distress. She has no wheezes. She has no rales.  Abdominal: Soft. Bowel sounds are normal. She exhibits no distension. There is no tenderness.  Musculoskeletal: Normal range of motion.  Lymphadenopathy:    She has no cervical adenopathy.  Neurological: She is alert.  Skin: Skin is warm and dry.  Geographic distribution of full-thickness grade 3 ulcers of the shin of the left leg. These cover much of the anterior surface of the leg. There is good granulation. No sign of erythema or cellulitis. The wounds are clean without debris or necrotic matter.  Psychiatric: Her affect is labile. Her speech is delayed. She is slowed. Cognition and memory are impaired. She exhibits abnormal recent memory.      Assessment & Plan:   Nguyen was seen today for venous stasis ulcer.  Diagnoses and all orders for this visit:  Venous stasis ulcer of other part of left lower leg with muscle involvement without evidence of necrosis with varicose veins ([place]) -     Face-to-face encounter (required for Medicare/Medicaid patients)  Other orders -     amoxicillin-clavulanate (AUGMENTIN) 875-125 MG tablet; Take 1 tablet by mouth 2 (two) times daily. Take all of this medication -     Cancel: Face-to-face encounter (required for Medicare/Medicaid patients)       I have discontinued Ms. Castleman's ciprofloxacin. I am also having her start on amoxicillin-clavulanate. Additionally, I  am having her maintain her OXYGEN-HELIUM IN, multivitamin with minerals, traMADol, levothyroxine, hydrocortisone, apixaban, furosemide, donepezil, simvastatin, and memantine.  Allergies as of 05/16/2016   No Known Allergies     Medication List       Accurate as of 05/16/16 11:59 PM. Always use your most recent med list.          amoxicillin-clavulanate 875-125 MG tablet Commonly known as:  AUGMENTIN Take 1 tablet by mouth 2 (two) times daily. Take all of this medication   amoxicillin-clavulanate 875-125 MG tablet Commonly known as:  AUGMENTIN TAKE ONE TABLET BY MOUTH TWICE DAILY   apixaban 5 MG Tabs tablet Commonly known as:  ELIQUIS Take 1 tablet (5 mg total) by mouth 2 (two) times daily.   donepezil 10 MG tablet Commonly known as:  ARICEPT TAKE ONE (1) TABLET AT BEDTIME   furosemide 40 MG tablet Commonly known as:  LASIX TAKE 1 TABLET EVERY MORNING AND TAKE 1/2 TABLET AT LUNCH   hydrocortisone 2.5 % rectal cream Commonly known as:  PROCTOZONE-HC Place 1 application rectally 2 (two) times daily.   levothyroxine 50 MCG tablet Commonly known as:  SYNTHROID, LEVOTHROID TAKE ONE (1) TABLET EACH DAY   memantine 10 MG tablet Commonly known as:  NAMENDA TAKE ONE TABLET BY MOUTH TWICE DAILY   multivitamin with minerals tablet Take 1 tablet by mouth daily.   OXYGEN Inhale 4 L into the lungs continuous. 4L   simvastatin 20 MG tablet Commonly known as:  ZOCOR TAKE ONE (1) TABLET EACH DAY   traMADol 50 MG tablet Commonly known as:  ULTRAM Take 1 tablet (50 mg total) by mouth 3 (three) times daily as needed for moderate pain.        Follow-up: Return in about 2 weeks (around 05/30/2016).  [name], M.D.

## 2016-05-16 NOTE — Telephone Encounter (Signed)
Pt. Daughter sent this image at time of office visit to avoid unwrapping the lesion. This is the right lower leg on April 19,2018 at the [facility]

## 2016-05-16 NOTE — Telephone Encounter (Signed)
[facility]

## 2016-05-20 ENCOUNTER — Ambulatory Visit (HOSPITAL_COMMUNITY): Payer: PPO | Admitting: Physical Therapy

## 2016-05-20 DIAGNOSIS — I890 Lymphedema, not elsewhere classified: Secondary | ICD-10-CM | POA: Diagnosis not present

## 2016-05-20 DIAGNOSIS — I872 Venous insufficiency (chronic) (peripheral): Secondary | ICD-10-CM

## 2016-05-20 DIAGNOSIS — L97911 Non-pressure chronic ulcer of unspecified part of right lower leg limited to breakdown of skin: Secondary | ICD-10-CM

## 2016-05-20 NOTE — Therapy (Signed)
[place], [name], [address] Phone: [phone]   Fax:  [phone]  Wound Care Therapy  Patient Details  Name: [name] MRN: [id number] Date of Birth: [birth year] Referring Provider: [name]   Encounter Date: 05/20/2016      PT End of Session - 05/20/16 1215    Visit Number 48   Number of Visits 49   Date for PT Re-Evaluation 05/06/16   Authorization Type [employer] : g code and recert done at visit 30 ; cert good until 3/7//6283   Authorization - Visit Number 65   Authorization - Number of Visits 52   PT Start Time 1030   PT Stop Time 1104   PT Time Calculation (min) 34 min   Activity Tolerance Patient tolerated treatment well   Behavior During Therapy [facility] for tasks assessed/performed      Past Medical History:  Diagnosis Date  . Chronic lung disease    on home O2   . Complete heart block (HCC)    s/p PPM by Dr Rayann Heman  . Debilitated   . Diastolic dysfunction   . Hypertension   . Lymphedema of right lower extremity   . MI (mitral incompetence)   . Obesity   . Permanent atrial fibrillation ([place])   . Pulmonary hypertension ([place])   . Rheumatoid arthritis(714.0)   . Sleep apnea   . Stroke [facility])    remote  . Venous insufficiency    with chronic leg ulcers    Past Surgical History:  Procedure Laterality Date  . PACEMAKER INSERTION  08/13/10   SJM by Dr Rayann Heman    There were no vitals filed for this visit.       Subjective Assessment - 05/20/16 1209    Pertinent History COPD with oxygen dependency (4L), HTN, RA, CVA, venous insufficency with chronic leg wounds, lymphedema.   How long can you sit comfortably? no problem    How long can you stand comfortably? less than five minutes   How long can you walk comfortably? less than five minutes                    Wound Therapy - 05/20/16 1209    Subjective Daughter states that she went to the MD and he has  ordered home health.  She has an appointment next Tuesday with her cardiovascular surgeons for ABI's.  Pt is bleeding well, unsure why ABI's are being taken.    Pain Assessment No/denies pain   Evaluation and Treatment Procedures Explained to Patient/Family Yes   Evaluation and Treatment Procedures agreed to   Wound Properties Date First Assessed: 04/05/16 Time First Assessed: 1517 Wound Type: Venous stasis ulcer Location: Leg Location Orientation: Right Wound Description (Comments): open ulcer around shin and calf Present on Admission: Yes   Dressing Type Compression wrap;Impregnated gauze (bismuth)   Dressing Changed Changed   Dressing Status Clean;Old drainage  increased drainage.   Dressing Change Frequency Other (Comment)  2X week   Site / Wound Assessment Friable;Red   % Wound base Red or Granulating 95%  99%   % Wound base Yellow/Fibrinous Exudate 5%  1%   Peri-wound Assessment Intact;Erythema (blanchable)  friable   Margins Unattached edges (unapproximated)   Closure None   Drainage Amount Minimal   Drainage Description Serosanguineous;No odor   Treatment Cleansed;Debridement (Selective)   Selective Debridement - Location Rt LE   Selective Debridement - Tools Used Forceps   Selective  Debridement - Tissue Removed Anterior wound    Wound Therapy - Clinical Statement Pt has increased hypergranualtion along anterior wound.  Therapist cut foam for anterior and dorsal foot,(dorsal foot just so compression bandaging fit better), in attempts to decrease hypergranulation.  Pt continues to bleed as soon as dressing is removed.  Pt will be discharged from Therapy as soon as home health services are obtained.    Factors Delaying/Impairing Wound Healing Infection - systemic/local;Multiple medical problems;Vascular compromise   Hydrotherapy Plan Debridement;Dressing change   Wound Therapy - Frequency Other (comment)   Wound Therapy - Current Recommendations --  2 times per week until pt can  get home health services    Wound Plan continue with cleansing and multilayer compression wrapping.  Follow up with transitioning to home health for dressing changes as no debridment needed at this point.    Dressing  Lt:  stocking is in place and per daughter the leg is wound free at this time.    Dressing Rt:  xeroform, and profore                 PT Education - 05/20/16 1207    Education provided Yes   Education Details NOted that pt was wearing compression stocking on Lt LE but had folded approximately 2" of the stocking over itself.  Explained to daughter that this was doubling the compression in this area and this should not be done.    Person(s) Educated Child(ren)   Methods Explanation   Comprehension Verbalized understanding          PT Short Term Goals - 05/08/16 1157      PT SHORT TERM GOAL #1   Title Pt to verbalize the importance of using compression every day to keep legs wound free   Time 1   Period Weeks   Status Achieved     PT SHORT TERM GOAL #2   Title Pt to state that the pain in her legs are no greater than 3/10 to allow pt to ambulate for five minutes without stopping    Time 2   Period Weeks   Status Achieved     PT SHORT TERM GOAL #3   Title Pt to have only 5 wounds on Rt LE and 2 on Lt LE    Time 2   Period Weeks   Status Achieved           PT Long Term Goals - 05/08/16 1158      PT LONG TERM GOAL #1   Title Pt to be wound free to reduce risk of infection    Time 4   Period Weeks   Status On-going     PT LONG TERM GOAL #2   Title Pt to have obtained compression garment and to be able to don and doff with minimal assist from family   has compression unable to use due to wounds    Time 4   Period Weeks   Status Partially Met     PT LONG TERM GOAL #3   Title Pt/ caregiver to understand and demonstrate correct wear time of garments and adhere to moisturizing LE's each night.     Time 4   Period Weeks   Status Achieved                Plan - 05/20/16 1216    Clinical Impression Statement see above    Rehab Potential Good   PT Frequency 2x / week  PT Duration 12 weeks  followed by one time a week for four weeks    PT Treatment/Interventions ADLs/Self Care Home Management;Gait training;Stair training;Therapeutic exercise;Patient/family education;Other (comment)  debridement and multilayer compression dressing    PT Next Visit Plan assess use of foam comfort and wound reaction.       Patient will benefit from skilled therapeutic intervention in order to improve the following deficits and impairments:  Pain, Increased edema, Difficulty walking, Decreased balance, Other (comment) (nonhealing wound )  Visit Diagnosis: Chronic ulcer of right leg, limited to breakdown of skin (HCC)  Venous insufficiency  Lymphedema of right lower extremity     Problem List Patient Active Problem List   Diagnosis Date Noted  . CAP (community acquired pneumonia) 04/04/2016  . Alzheimer's disease 10/11/2014  . Essential hypertension 10/11/2014  . Cardiomyopathy, dilated ([place]) 07/24/2014  . Apnea, sleep 07/24/2014  . Anemia 04/28/2014  . Chronic hypoxemic respiratory failure ([place]) 04/12/2014  . Chronic ulcer of leg ([place]) 04/12/2014  . Lymphedema of leg 04/12/2014  . Disorder of nutrition 04/12/2014  . Hypothyroidism 03/24/2014  . Stasis dermatitis of both legs 05/06/2013  . Pacemaker-St.Jude 11/25/2011  . Complete heart block ([place]) 12/12/2010  . Permanent atrial fibrillation ([place]) 12/12/2010  . Diastolic dysfunction 44/46/1901  . Venous insufficiency 12/12/2010  . Long term current use of anticoagulant 09/30/2002    Rayetta Humphrey, PT CLT [phone] 05/20/2016, 12:17 PM  [place] [address] [place], [name], [address] Phone: [phone]   Fax:  [phone]  Name: [name] MRN: [id number] Date of Birth: 1933/11/13

## 2016-05-21 ENCOUNTER — Encounter: Payer: Self-pay | Admitting: Surgery

## 2016-05-22 ENCOUNTER — Encounter: Payer: Self-pay | Admitting: Family Medicine

## 2016-05-22 ENCOUNTER — Ambulatory Visit (HOSPITAL_COMMUNITY): Payer: PPO | Admitting: Physical Therapy

## 2016-05-22 ENCOUNTER — Telehealth: Payer: Self-pay

## 2016-05-22 DIAGNOSIS — I890 Lymphedema, not elsewhere classified: Secondary | ICD-10-CM | POA: Diagnosis not present

## 2016-05-22 DIAGNOSIS — I872 Venous insufficiency (chronic) (peripheral): Secondary | ICD-10-CM

## 2016-05-22 DIAGNOSIS — L97911 Non-pressure chronic ulcer of unspecified part of right lower leg limited to breakdown of skin: Secondary | ICD-10-CM

## 2016-05-22 NOTE — Therapy (Signed)
[place], [name], [address] Phone: [phone]   Fax:  [phone]  Wound Care Therapy  Patient Details  Name: [name] MRN: [id number] Date of Birth: 02/09/1933 Referring Provider: [name]   Encounter Date: 05/22/2016      PT End of Session - 05/22/16 1207    Visit Number 41   Number of Visits 49   Date for PT Re-Evaluation 05/06/16   Authorization Type [employer] : g code and recert done at visit 80 ; cert good until 6/2//5638   Authorization - Visit Number 58   Authorization - Number of Visits 78   PT Start Time 9373   PT Stop Time 1115   PT Time Calculation (min) 35 min   Activity Tolerance Patient tolerated treatment well   Behavior During Therapy [facility] for tasks assessed/performed      Past Medical History:  Diagnosis Date  . Chronic lung disease    on home O2   . Complete heart block (HCC)    s/p PPM by Dr Rayann Heman  . Debilitated   . Diastolic dysfunction   . Hypertension   . Lymphedema of right lower extremity   . MI (mitral incompetence)   . Obesity   . Permanent atrial fibrillation ([place])   . Pulmonary hypertension ([place])   . Rheumatoid arthritis(714.0)   . Sleep apnea   . Stroke [facility])    remote  . Venous insufficiency    with chronic leg ulcers    Past Surgical History:  Procedure Laterality Date  . PACEMAKER INSERTION  08/13/10   SJM by Dr Rayann Heman    There were no vitals filed for this visit.                  Wound Therapy - 05/22/16 1159    Subjective Daughter states that she went to the MD and he has ordered home health.  She has an appointment next Tuesday with her cardiovascular surgeons for ABI's.  Pt is bleeding well, unsure why ABI's are being taken.    Pain Assessment No/denies pain   Evaluation and Treatment Procedures Explained to Patient/Family Yes   Evaluation and Treatment Procedures agreed to   Wound Properties Date First  Assessed: 04/05/16 Time First Assessed: 4287 Wound Type: Venous stasis ulcer Location: Leg Location Orientation: Right Wound Description (Comments): open ulcer around shin and calf Present on Admission: Yes   Dressing Type Compression wrap;Impregnated gauze (bismuth)   Dressing Status Clean;Old drainage  increased drainage.   Dressing Change Frequency Other (Comment)  2X week   Site / Wound Assessment Friable;Red   % Wound base Red or Granulating 100%  99%   % Wound base Yellow/Fibrinous Exudate 0%  1%   Peri-wound Assessment Intact;Erythema (blanchable)  friable   Wound Length (cm) --  ant wound  is  same with horseshoe shape length11/top width    Wound Width (cm) --  lateral wound 12x 9 was 13x 5 (increased in size)   Margins Unattached edges (unapproximated)   Closure None   Drainage Amount Minimal   Drainage Description Serosanguineous;No odor   Treatment Cleansed;Other (Comment)  multilayer compressin wrap with foam    Wound Therapy - Clinical Statement Hypergranulation decreased by foam.  Pt was comfortable with foam so we will continue the use.  New foam needed to be cut due to drainage on old foam.  Pt has not heard back from home health services so we will  wait until we hear that pt is being followed somewhere prior to D/C. If pt continues treatment we will have pt decrease to one time a week    Factors Delaying/Impairing Wound Healing Infection - systemic/local;Multiple medical problems;Vascular compromise   Hydrotherapy Plan Debridement;Dressing change   Wound Therapy - Frequency Other (comment)   Wound Therapy - Current Recommendations --  2 times per week until pt can get home health services    Wound Plan continue with cleansing and multilayer compression wrapping.  Follow up with transitioning to home health for dressing changes as no debridment needed at this point.    Dressing  Lt:  stocking is in place and per daughter the leg is wound free at this time.    Dressing  Rt:  xeroform, and profore                   PT Short Term Goals - 05/08/16 1157      PT SHORT TERM GOAL #1   Title Pt to verbalize the importance of using compression every day to keep legs wound free   Time 1   Period Weeks   Status Achieved     PT SHORT TERM GOAL #2   Title Pt to state that the pain in her legs are no greater than 3/10 to allow pt to ambulate for five minutes without stopping    Time 2   Period Weeks   Status Achieved     PT SHORT TERM GOAL #3   Title Pt to have only 5 wounds on Rt LE and 2 on Lt LE    Time 2   Period Weeks   Status Achieved           PT Long Term Goals - 05/08/16 1158      PT LONG TERM GOAL #1   Title Pt to be wound free to reduce risk of infection    Time 4   Period Weeks   Status On-going     PT LONG TERM GOAL #2   Title Pt to have obtained compression garment and to be able to don and doff with minimal assist from family   has compression unable to use due to wounds    Time 4   Period Weeks   Status Partially Met     PT LONG TERM GOAL #3   Title Pt/ caregiver to understand and demonstrate correct wear time of garments and adhere to moisturizing LE's each night.     Time 4   Period Weeks   Status Achieved               Plan - 05/22/16 1208    Clinical Impression Statement see above; wound are not healing.  There is no debridement to be done at this time.  Pt might benefit from going to the wound center to see if that MD has any new ideas on treatment.    Rehab Potential Good   PT Frequency 2x / week   PT Duration 12 weeks  followed by one time a week for four weeks    PT Treatment/Interventions ADLs/Self Care Home Management;Gait training;Stair training;Therapeutic exercise;Patient/family education;Other (comment)  debridement and multilayer compression dressing    PT Next Visit Plan await call from daughter.       Patient will benefit from skilled therapeutic intervention in order to improve the  following deficits and impairments:  Pain, Increased edema, Difficulty walking, Decreased balance, Other (comment) (nonhealing wound )  Visit Diagnosis: Chronic ulcer of right leg, limited to breakdown of skin ([place])  Venous insufficiency  Lymphedema of right lower extremity     Problem List Patient Active Problem List   Diagnosis Date Noted  . CAP (community acquired pneumonia) 04/04/2016  . Alzheimer's disease 10/11/2014  . Essential hypertension 10/11/2014  . Cardiomyopathy, dilated ([place]) 07/24/2014  . Apnea, sleep 07/24/2014  . Anemia 04/28/2014  . Chronic hypoxemic respiratory failure ([place]) 04/12/2014  . Chronic ulcer of leg ([place]) 04/12/2014  . Lymphedema of leg 04/12/2014  . Disorder of nutrition 04/12/2014  . Hypothyroidism 03/24/2014  . Stasis dermatitis of both legs 05/06/2013  . Pacemaker-St.Jude 11/25/2011  . Complete heart block ([place]) 12/12/2010  . Permanent atrial fibrillation ([place]) 12/12/2010  . Diastolic dysfunction 06/13/3356  . Venous insufficiency 12/12/2010  . Long term current use of anticoagulant 09/30/2002    Rayetta Humphrey, PT CLT [phone] 05/22/2016, 12:10 PM  [facility] [address] [place], [name], [address] Phone: [phone]   Fax:  [phone]  Name: [name] MRN: [id number] Date of Birth: [birth year]

## 2016-05-23 NOTE — Telephone Encounter (Signed)
Please do

## 2016-05-23 NOTE — Telephone Encounter (Signed)
Contacted pt. Daughter. Pt. Doing well. Let her know the error was mine. Referral to be expedited today.

## 2016-05-23 NOTE — Telephone Encounter (Signed)
So, sorry. It is in the system now.

## 2016-05-23 NOTE — Addendum Note (Signed)
Addended by: Marylin Crosby on: 05/23/2016 04:30 PM   Modules accepted: Orders

## 2016-05-24 DIAGNOSIS — J449 Chronic obstructive pulmonary disease, unspecified: Secondary | ICD-10-CM | POA: Diagnosis not present

## 2016-05-28 ENCOUNTER — Ambulatory Visit (INDEPENDENT_AMBULATORY_CARE_PROVIDER_SITE_OTHER): Payer: PPO | Admitting: Surgery

## 2016-05-28 ENCOUNTER — Encounter: Payer: Self-pay | Admitting: Surgery

## 2016-05-28 ENCOUNTER — Ambulatory Visit (HOSPITAL_COMMUNITY)
Admission: RE | Admit: 2016-05-28 | Discharge: 2016-05-28 | Disposition: A | Discharge status: Home / Self Care | Payer: PPO | Source: Ambulatory Visit | Attending: Surgery | Admitting: Surgery

## 2016-05-28 VITALS — BP 120/71 | HR 70 | Temp 97.4°F | Resp 28 | Ht 59.0 in | Wt 138.0 lb

## 2016-05-28 DIAGNOSIS — I70238 Atherosclerosis of native arteries of right leg with ulceration of other part of lower right leg: Secondary | ICD-10-CM

## 2016-05-28 DIAGNOSIS — I739 Peripheral vascular disease, unspecified: Secondary | ICD-10-CM | POA: Insufficient documentation

## 2016-05-28 DIAGNOSIS — I70249 Atherosclerosis of native arteries of left leg with ulceration of unspecified site: Secondary | ICD-10-CM

## 2016-05-28 DIAGNOSIS — I70239 Atherosclerosis of native arteries of right leg with ulceration of unspecified site: Secondary | ICD-10-CM | POA: Diagnosis not present

## 2016-05-28 NOTE — Progress Notes (Signed)
[facility] [place]  Patient name: [name] MRN: [id number] DOB: Dec 09, 1933 Sex: female   REFERRING PROVIDER:   Dr. Livia Snellen   REASON FOR CONSULT:    PVD with ulcer  HISTORY OF PRESENT ILLNESS:   [name] is a 83 y.o. female, who is Referred for nonhealing bilateral lower extremity wounds.  The right is worse than the left.  The patient has had these off and on for many years.  Most recently the right leg has been present since the beginning of 2018.  She is being treated at the wound center with Profore compression.  The left leg is almost healed.  The right has not made much progress she has been on antibiotics.  The patient has a history of complete heart block and now has a pacemaker in place.  She is on Eliquis.  She has chronic lung disease on home oxygen.  She is not a smoker.  She has chronic venous insufficiency.  She is medically managed for hypertension.  She is on a statin for hypercholesterolemia.  PAST MEDICAL HISTORY    Past Medical History:  Diagnosis Date  . Chronic lung disease    on home O2   . Complete heart block (HCC)    s/p PPM by Dr Rayann Heman  . Debilitated   . Diastolic dysfunction   . Hypertension   . Lymphedema of right lower extremity   . MI (mitral incompetence)   . Obesity   . Permanent atrial fibrillation ([place])   . Pulmonary hypertension ([place])   . Rheumatoid arthritis(714.0)   . Sleep apnea   . Stroke [facility])    remote  . Venous insufficiency    with chronic leg ulcers     FAMILY HISTORY   No family history on file.  SOCIAL HISTORY:   Social History   Social History  . Marital status: Married    Spouse name: N/A  . Number of children: N/A  . Years of education: N/A   Occupational History  . Not on file.   Social History Main Topics  . Smoking status: Never Smoker  . Smokeless tobacco: Never Used  . Alcohol use No  . Drug use: No  . Sexual activity:  Not on file   Other Topics Concern  . Not on file   Social History Narrative   Lives in [place]:    No Known Allergies  CURRENT MEDICATIONS:    Current Outpatient Prescriptions  Medication Sig Dispense Refill  . amoxicillin-clavulanate (AUGMENTIN) 875-125 MG tablet Take 1 tablet by mouth 2 (two) times daily. Take all of this medication 20 tablet 0  . apixaban (ELIQUIS) 5 MG TABS tablet Take 1 tablet (5 mg total) by mouth 2 (two) times daily. (Patient taking differently: Take 5 mg by mouth every morning. ) 42 tablet 0  . donepezil (ARICEPT) 10 MG tablet TAKE ONE (1) TABLET AT BEDTIME 90 tablet 1  . furosemide (LASIX) 40 MG tablet TAKE 1 TABLET EVERY MORNING AND TAKE 1/2 TABLET AT LUNCH (Patient taking differently: TAKE 1 TABLET EVERY MORNING) 135 tablet 1  . hydrocortisone (PROCTOZONE-HC) 2.5 % rectal cream Place 1 application rectally 2 (two) times daily. 30 g 0  . levothyroxine (SYNTHROID, LEVOTHROID) 50 MCG tablet TAKE ONE (1) TABLET EACH DAY 90 tablet 2  . memantine (NAMENDA) 10 MG tablet TAKE ONE TABLET BY MOUTH TWICE DAILY 180 tablet 1  . Multiple Vitamins-Minerals (  MULTIVITAMIN WITH MINERALS) tablet Take 1 tablet by mouth daily.    . OXYGEN-HELIUM IN Inhale 4 L into the lungs continuous. 4L    . simvastatin (ZOCOR) 20 MG tablet TAKE ONE (1) TABLET EACH DAY 90 tablet 0  . traMADol (ULTRAM) 50 MG tablet Take 1 tablet (50 mg total) by mouth 3 (three) times daily as needed for moderate pain. 60 tablet 3   No current facility-administered medications for this visit.     REVIEW OF SYSTEMS:   [X]  denotes positive finding, [ ]  denotes negative finding Cardiac  Comments:  Chest pain or chest pressure:    Shortness of breath upon exertion:    Short of breath when lying flat: x   Irregular heart rhythm: x       Vascular    Pain in calf, thigh, or hip brought on by ambulation: x   Pain in feet at night that wakes you up from your sleep:     Blood clot  in your veins:    Leg swelling:  x       Pulmonary    Oxygen at home: x   Productive cough:     Wheezing:  x       Neurologic    Sudden weakness in arms or legs:     Sudden numbness in arms or legs:     Sudden onset of difficulty speaking or slurred speech:    Temporary loss of vision in one eye:     Problems with dizziness:         Gastrointestinal    Blood in stool:      Vomited blood:         Genitourinary    Burning when urinating:     Blood in urine:        Psychiatric    Major depression:         Hematologic    Bleeding problems:    Problems with blood clotting too easily:        Skin    Rashes or ulcers: x       Constitutional    Fever or chills:     PHYSICAL EXAM:   Vitals:   05/28/16 1206  BP: 120/71  Pulse: 70  Resp: (!) 28  Temp: 97.4 F (36.3 C)  TempSrc: Oral  SpO2: 100%  Weight: 138 lb (62.6 kg)  Height: 4\' 11"  (1.499 m)    GENERAL: The patient is a well-nourished female, in no acute distress. The vital signs are documented above. CARDIAC: There is a regular rate and rhythm.  VASCULAR: I cannot palpate pedal pulses PULMONARY: Nonlabored respirations MUSCULOSKELETAL: There are no major deformities or cyanosis. NEUROLOGIC: No focal weakness or paresthesias are detected. SKIN: Extensive right leg wounds from the knee to the ankle.  These appear to be covered with a biofilm and pink granulation tissue without evidence of cellulitis. PSYCHIATRIC: The patient has a normal affect.  STUDIES:   I have reviewed the ABIs.  Arteries were noncompressible.  Monophasic left posterior tibial and triphasic dorsalis pedis.  Biphasic right posterior tibial and triphasic dorsalis pedis.  Toe pressure on the right was 79 and on the left was 88.  ASSESSMENT and PLAN   Nonhealing right lower extremity wound: The patient has not had any significant improvement in the wounds on her right leg for several months.  Her vascular lab studies today suggest borderline  blood flow to heal her wound.  I have recommended proceeding with  angiography to better define her anatomy and to intervene on the right leg if possible.  She is in agreement to proceed.  I will plan on cannulation of the left leg and studying both legs with intervention on the right if indicated.  I have scheduled her procedure for Tuesday, May 16.  I will stop her Eliquis if okay with Dr. Rayann Heman  I do think the patient has a biofilm over top of her wound and may benefit from debridement at the wound center.   Annamarie Major, MD [facility] [phone] Pager [phone]

## 2016-05-29 ENCOUNTER — Ambulatory Visit (HOSPITAL_COMMUNITY): Payer: PPO | Attending: Family Medicine | Admitting: Physical Therapy

## 2016-05-29 DIAGNOSIS — L97901 Non-pressure chronic ulcer of unspecified part of unspecified lower leg limited to breakdown of skin: Secondary | ICD-10-CM | POA: Diagnosis not present

## 2016-05-29 DIAGNOSIS — I890 Lymphedema, not elsewhere classified: Secondary | ICD-10-CM | POA: Insufficient documentation

## 2016-05-29 DIAGNOSIS — L97911 Non-pressure chronic ulcer of unspecified part of right lower leg limited to breakdown of skin: Secondary | ICD-10-CM | POA: Diagnosis not present

## 2016-05-29 DIAGNOSIS — M79661 Pain in right lower leg: Secondary | ICD-10-CM | POA: Insufficient documentation

## 2016-05-29 DIAGNOSIS — I872 Venous insufficiency (chronic) (peripheral): Secondary | ICD-10-CM | POA: Diagnosis not present

## 2016-05-29 NOTE — Therapy (Addendum)
[place], [name], [address] Phone: [phone]   Fax:  [phone]  Wound Care Therapy  Patient Details  Name: [name] MRN: [id number] Date of Birth: 09-Jan-1934 Referring Provider: [name]   Encounter Date: 05/29/2016    Past Medical History:  Diagnosis Date  . Chronic lung disease    on home O2   . Complete heart block (HCC)    s/p PPM by Dr Rayann Heman  . Debilitated   . Diastolic dysfunction   . Hypertension   . Lymphedema of right lower extremity   . MI (mitral incompetence)   . Obesity   . Permanent atrial fibrillation ([place])   . Pulmonary hypertension ([place])   . Rheumatoid arthritis(714.0)   . Sleep apnea   . Stroke [facility])    remote  . Venous insufficiency    with chronic leg ulcers    Past Surgical History:  Procedure Laterality Date  . PACEMAKER INSERTION  08/13/10   SJM by Dr Rayann Heman    There were no vitals filed for this visit.                  Wound Therapy - 05/29/16 1502    Subjective daughter called this morning requesting patient be worked into schedule to complete dressing change as she went to vascular surgeon yesterday and they did not apply compression bandaging to LE's.  Daughter states they completed ABI's and it was considered "normal" (toe pressure Rt: 79 and Lt: 88)  Daughter reports surgeon wants to do a cath to check if any blockages in Rt LE but unsure if her mother can tolerate the procedure and laying flat.  STates she is going to contact her cardiologist and talk to him.  Consult has been put in for home health for dresing changes, however they are so busy they cannot get to patinent until next week.     Evaluation and Treatment Procedures Explained to Patient/Family Yes   Evaluation and Treatment Procedures agreed to   Wound Properties Date First Assessed: 04/05/16 Time First Assessed: 3810 Wound Type: Venous stasis ulcer Location: Leg Location  Orientation: Right Wound Description (Comments): open ulcer around shin and calf Present on Admission: Yes   Dressing Type Compression wrap;Impregnated gauze (bismuth)   Dressing Changed Changed   Dressing Status Clean;Old drainage  increased drainage.   Dressing Change Frequency Other (Comment)  2X week   Site / Wound Assessment Friable;Red   % Wound base Red or Granulating 100%  99%   % Wound base Yellow/Fibrinous Exudate 0%  1%   Peri-wound Assessment Intact;Erythema (blanchable)  friable   Margins Unattached edges (unapproximated)   Closure None   Drainage Amount Minimal   Drainage Description Serosanguineous;No odor   Treatment Cleansed   Wound Therapy - Clinical Statement Hypergranulation continued on anterior Rt LE with no approximation of wound borders or reduction in size.   Wounds cleansed/irrigated and surrounding tissue moisturized.  Continued with xeroform dressings and profore bilaterally.  Lt LE with only small area that appears to be skin tear and anticipate this to be healed by next week to transition back to compression garments.  Suggest to daughter to speak to MD regarding other options for woundcare (.i.e. surgical debridment of hypergranulation, wound vac, transition to [place] with MD care or hyperbaric wound care)  Pt will need to continue dressing changes at our facility until transitions to alternative venue (home health vs alternative facility).  Pt and  daughter verbalized understanding.     Factors Delaying/Impairing Wound Healing Infection - systemic/local;Multiple medical problems;Vascular compromise   Hydrotherapy Plan Debridement;Dressing change   Wound Therapy - Frequency Other (comment)   Wound Therapy - Current Recommendations --  2 times per week until pt can get home health services    Wound Plan continue with cleansing and multilayer compression wrapping.  Follow up with transitioning care.   Dressing  Lt:  xeroform to lateral distal LE and profore    Dressing Rt:  xeroform, and profore                   PT Short Term Goals - 05/08/16 1157      PT SHORT TERM GOAL #1   Title Pt to verbalize the importance of using compression every day to keep legs wound free   Time 1   Period Weeks   Status Achieved     PT SHORT TERM GOAL #2   Title Pt to state that the pain in her legs are no greater than 3/10 to allow pt to ambulate for five minutes without stopping    Time 2   Period Weeks   Status Achieved     PT SHORT TERM GOAL #3   Title Pt to have only 5 wounds on Rt LE and 2 on Lt LE    Time 2   Period Weeks   Status Achieved           PT Long Term Goals - 05/08/16 1158      PT LONG TERM GOAL #1   Title Pt to be wound free to reduce risk of infection    Time 4   Period Weeks   Status On-going     PT LONG TERM GOAL #2   Title Pt to have obtained compression garment and to be able to don and doff with minimal assist from family   has compression unable to use due to wounds    Time 4   Period Weeks   Status Partially Met     PT LONG TERM GOAL #3   Title Pt/ caregiver to understand and demonstrate correct wear time of garments and adhere to moisturizing LE's each night.     Time 4   Period Weeks   Status Achieved             Patient will benefit from skilled therapeutic intervention in order to improve the following deficits and impairments:     Visit Diagnosis: Chronic ulcer of right leg, limited to breakdown of skin (HCC)  Venous insufficiency  Lymphedema of right lower extremity  Chronic ulcer of leg, limited to breakdown of skin, unspecified laterality (HCC)  Pain in right lower leg     Problem List Patient Active Problem List   Diagnosis Date Noted  . PVD (peripheral vascular disease) (HCC) 05/28/2016  . Atherosclerosis of native artery of both lower extremities with bilateral ulceration (HCC) 05/28/2016  . CAP (community acquired pneumonia) 04/04/2016  . Alzheimer's disease  10/11/2014  . Essential hypertension 10/11/2014  . Cardiomyopathy, dilated (HCC) 07/24/2014  . Apnea, sleep 07/24/2014  . Anemia 04/28/2014  . Chronic hypoxemic respiratory failure (HCC) 04/12/2014  . Chronic ulcer of leg (HCC) 04/12/2014  . Lymphedema of leg 04/12/2014  . Disorder of nutrition 04/12/2014  . Hypothyroidism 03/24/2014  . Stasis dermatitis of both legs 05/06/2013  . Pacemaker-St.Jude 11/25/2011  . Complete heart block (HCC) 12/12/2010  . Permanent atrial fibrillation (HCC) 12/12/2010  .   Diastolic dysfunction 12/12/2010  . Venous insufficiency 12/12/2010  . Long term current use of anticoagulant 09/30/2002     B , PTA/CLT [phone] Cynthia Russell, PT CLT [phone] 05/29/2016, 3:23 PM  [facility] [address] [place], [place], [address] Phone: [phone]   Fax:  [phone]  Name: Indie M Krohn MRN: [id number] Date of Birth: 10/29/1933    

## 2016-06-03 ENCOUNTER — Telehealth (HOSPITAL_COMMUNITY): Payer: Self-pay | Admitting: Family Medicine

## 2016-06-03 ENCOUNTER — Ambulatory Visit (HOSPITAL_COMMUNITY): Payer: PPO | Admitting: Physical Therapy

## 2016-06-03 ENCOUNTER — Ambulatory Visit (INDEPENDENT_AMBULATORY_CARE_PROVIDER_SITE_OTHER): Payer: PPO | Admitting: *Deleted

## 2016-06-03 DIAGNOSIS — I442 Atrioventricular block, complete: Secondary | ICD-10-CM

## 2016-06-03 NOTE — Progress Notes (Signed)
Remote pacemaker transmission.   

## 2016-06-03 NOTE — Telephone Encounter (Signed)
06/03/16 [name] called to cx because they were having car trouble

## 2016-06-04 NOTE — Addendum Note (Signed)
Addended by: Leeroy Cha on: 06/04/2016 11:40 AM   Modules accepted: Orders

## 2016-06-05 ENCOUNTER — Ambulatory Visit (HOSPITAL_COMMUNITY): Payer: PPO | Admitting: Physical Therapy

## 2016-06-05 DIAGNOSIS — L97901 Non-pressure chronic ulcer of unspecified part of unspecified lower leg limited to breakdown of skin: Secondary | ICD-10-CM

## 2016-06-05 DIAGNOSIS — M79661 Pain in right lower leg: Secondary | ICD-10-CM

## 2016-06-05 DIAGNOSIS — I890 Lymphedema, not elsewhere classified: Secondary | ICD-10-CM

## 2016-06-05 DIAGNOSIS — I872 Venous insufficiency (chronic) (peripheral): Secondary | ICD-10-CM

## 2016-06-05 DIAGNOSIS — L97911 Non-pressure chronic ulcer of unspecified part of right lower leg limited to breakdown of skin: Secondary | ICD-10-CM | POA: Diagnosis not present

## 2016-06-05 LAB — CUP PACEART REMOTE DEVICE CHECK
Battery Remaining Longevity: 139 mo
Battery Remaining Percentage: 95.5 %
Battery Voltage: 2.98 V
Brady Statistic RV Percent Paced: 34 %
Implantable Pulse Generator Implant Date: 20120717
Lead Channel Impedance Value: 590 Ohm
Lead Channel Sensing Intrinsic Amplitude: 5.9 mV
Lead Channel Setting Pacing Amplitude: 2.5 V
Lead Channel Setting Pacing Pulse Width: 0.4 ms
MDC IDC LEAD IMPLANT DT: 20120717
MDC IDC LEAD LOCATION: 753860
MDC IDC MSMT LEADCHNL RV PACING THRESHOLD AMPLITUDE: 0.75 V
MDC IDC MSMT LEADCHNL RV PACING THRESHOLD PULSEWIDTH: 0.4 ms
MDC IDC SESS DTM: 20180508073142
MDC IDC SET LEADCHNL RV SENSING SENSITIVITY: 2.5 mV
Pulse Gen Model: 1210
Pulse Gen Serial Number: 7245638

## 2016-06-05 NOTE — Therapy (Addendum)
[place], [name], [address] Phone: [phone]   Fax:  [phone]  Wound Care Therapy  Patient Details  Name: [name] MRN: [id number] Date of Birth: Apr 17, 1933 Referring Provider: [name]   Encounter Date: 06/05/2016      PT End of Session - 06/05/16 1556    Visit Number 50   Number of Visits 50   Date for PT Re-Evaluation 05/06/16   Authorization Type [employer] : g code and recert done at visit 41 ; cert good until 10/03/8476   Authorization - Visit Number 107   Authorization - Number of Visits 22   PT Start Time 0950   PT Stop Time 1030   PT Time Calculation (min) 40 min   Activity Tolerance Patient tolerated treatment well   Behavior During Therapy [facility] for tasks assessed/performed      Past Medical History:  Diagnosis Date  . Chronic lung disease    on home O2   . Complete heart block (HCC)    s/p PPM by Dr Rayann Heman  . Debilitated   . Diastolic dysfunction   . Hypertension   . Lymphedema of right lower extremity   . MI (mitral incompetence)   . Obesity   . Permanent atrial fibrillation ([place])   . Pulmonary hypertension ([place])   . Rheumatoid arthritis(714.0)   . Sleep apnea   . Stroke [facility])    remote  . Venous insufficiency    with chronic leg ulcers    Past Surgical History:  Procedure Laterality Date  . PACEMAKER INSERTION  08/13/10   SJM by Dr Rayann Heman    There were no vitals filed for this visit.                  Wound Therapy - 06/05/16 1552    Subjective daughter states they are waiting to hear back from [facility] wound center for an appointment.  States home health is just too busty to take anymore patients at this time.    Pain Assessment No/denies pain   Evaluation and Treatment Procedures Explained to Patient/Family Yes   Evaluation and Treatment Procedures agreed to   Wound Properties Date First Assessed: 04/05/16 Time First Assessed: 4128  Wound Type: Venous stasis ulcer Location: Leg Location Orientation: Right Wound Description (Comments): open ulcer around shin and calf Present on Admission: Yes   Dressing Type Compression wrap;Impregnated gauze (bismuth)   Dressing Changed Changed   Dressing Status Clean;Old drainage  increased drainage.   Dressing Change Frequency Other (Comment)  2X week   Site / Wound Assessment Friable;Red   % Wound base Red or Granulating 100%  99%   % Wound base Yellow/Fibrinous Exudate 0%  1%   Peri-wound Assessment Intact;Erythema (blanchable)  friable   Margins Unattached edges (unapproximated)   Closure None   Drainage Amount Minimal   Drainage Description Serosanguineous;No odor   Treatment Cleansed;Debridement ([place])   Wound Therapy - Clinical Statement Hypergranulation continued on anterior Rt LE with no approximation of wound borders or reduction in size.   Wounds cleansed/irrigated and surrounding tissue moisturized.  Continued with xeroform dressings and profore bilaterally.  Lt LE is now completely healed with instructions to don compression garment when returns home.  Pt will need to continue dressing changes at our facility until transitions to [facility] wound center.   Factors Delaying/Impairing Wound Healing Infection - systemic/local;Multiple medical problems;Vascular compromise   Hydrotherapy Plan Debridement;Dressing change   Wound Therapy -  Frequency Other (comment)   Wound Therapy - Current Recommendations --  2 times per week until pt can get home health services    Wound Plan continue with cleansing and multilayer compression wrapping.  Follow up with transitioning care.   Dressing  Lt: no intervention other than cleansing   Dressing Rt:  xeroform, and profore                   PT Short Term Goals - 05/08/16 1157      PT SHORT TERM GOAL #1   Title Pt to verbalize the importance of using compression every day to keep legs wound free   Time 1   Period  Weeks   Status Achieved     PT SHORT TERM GOAL #2   Title Pt to state that the pain in her legs are no greater than 3/10 to allow pt to ambulate for five minutes without stopping    Time 2   Period Weeks   Status Achieved     PT SHORT TERM GOAL #3   Title Pt to have only 5 wounds on Rt LE and 2 on Lt LE    Time 2   Period Weeks   Status Achieved           PT Long Term Goals - 05/08/16 1158      PT LONG TERM GOAL #1   Title Pt to be wound free to reduce risk of infection    Time 4   Period Weeks   Status On-going     PT LONG TERM GOAL #2   Title Pt to have obtained compression garment and to be able to don and doff with minimal assist from family   has compression unable to use due to wounds    Time 4   Period Weeks   Status Partially Met     PT LONG TERM GOAL #3   Title Pt/ caregiver to understand and demonstrate correct wear time of garments and adhere to moisturizing LE's each night.     Time 4   Period Weeks   Status Achieved             Patient will benefit from skilled therapeutic intervention in order to improve the following deficits and impairments:     Visit Diagnosis: Chronic ulcer of right leg, limited to breakdown of skin (HCC)  Venous insufficiency  Lymphedema of right lower extremity  Chronic ulcer of leg, limited to breakdown of skin, unspecified laterality (HCC)  Pain in right lower leg     Problem List Patient Active Problem List   Diagnosis Date Noted  . PVD (peripheral vascular disease) ([place]) 05/28/2016  . 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 CAP (community acquired pneumonia) 04/04/2016  . Alzheimer's disease 10/11/2014  . Essential hypertension 10/11/2014  . Cardiomyopathy, dilated ([place]) 07/24/2014  . Apnea, sleep 07/24/2014  . Anemia 04/28/2014  . Chronic hypoxemic respiratory failure ([place]) 04/12/2014  . Chronic ulcer of leg ([place]) 04/12/2014  .  Lymphedema of leg 04/12/2014  . Disorder of nutrition 04/12/2014  . Hypothyroidism 03/24/2014  . Stasis dermatitis of both legs 05/06/2013  . Pacemaker-[employer]ude 11/25/2011  . Complete heart block ([place]) 12/12/2010  . Permanent atrial fibrillation ([place]) 12/12/2010  . Diastolic dysfunction 75/10/2583  . Venous insufficiency 12/12/2010  . Long term current use of anticoagulant 09/30/2002  [id number]  CJ [id number]  Oliver, PTA/CLT [phone]  06/05/2016, 4:00 PM  [place], [name], [address] Phone: [phone]   Fax:  [phone]  Name: PRESCILLA MONGER MRN: [id number] Date of Birth: Jul 14, 1933  PHYSICAL THERAPY DISCHARGE SUMMARY  Visits from Start of Care: 50  Current functional level related to goals / functional outcomes: Pt Lt LE wounds have healed but RT continues to wax and wane but has 100% granulation.   Remaining deficits: Non-healing wound on RT    Education / Equipment: We have seen Ms. Endicott for 8 months now for bilateral LE wounds.  The wounds on her left LE have healed But she continues to have wounds on her RT.  She was being seen for debridement of the Rt LE wounds but at this  Time the wound is 100% granulated but is showing no signs of closing.  PT no longer needs skilled PT as she no Longer needs debridement but recommend referral to wound center for possible silvernitrate treatment to wound bed To prevent hypergranulation or hybairic chamber for healing of wound. Plan: Patient agrees to discharge.  Patient goals were partially met. Patient is being discharged due to lack of progress.  ?????  Rayetta Humphrey, [place] CLT [phone]

## 2016-06-09 ENCOUNTER — Encounter: Payer: Self-pay | Admitting: Family Medicine

## 2016-06-09 ENCOUNTER — Ambulatory Visit: Payer: PPO | Admitting: Cardiology

## 2016-06-09 NOTE — Progress Notes (Deleted)
Clinical Summary [name] is a 83 y.o.female seen today for follow up of the following medical problems.   1. Heart block - pacemaker followed by Dr Rayann Heman - no recent lightheadedness or dizziness.   2. PAF - no recent palpitations. No significant dizziness. - compliant with meds. She is off eliquis for a few weeks due to cost, family restarted aspirin   3. Valvular heart disease - breathing is overall stable. No recent LE edema  4. Leg swelling - wearing compression stockings at home. Compliant with lasix.   5. PAD - followed by vascular - history of nonhealting bialteral lower extremity wounds Past Medical History:  Diagnosis Date  . Chronic lung disease    on home O2   . Complete heart block (HCC)    s/p PPM by Dr Rayann Heman  . Debilitated   . Diastolic dysfunction   . Hypertension   . Lymphedema of right lower extremity   . MI (mitral incompetence)   . Obesity   . Permanent atrial fibrillation ([place])   . Pulmonary hypertension ([place])   . Rheumatoid arthritis(714.0)   . Sleep apnea   . Stroke [facility])    remote  . Venous insufficiency    with chronic leg ulcers     No Known Allergies   Current Outpatient Prescriptions  Medication Sig Dispense Refill  . amoxicillin-clavulanate (AUGMENTIN) 875-125 MG tablet Take 1 tablet by mouth 2 (two) times daily. Take all of this medication 20 tablet 0  . apixaban (ELIQUIS) 5 MG TABS tablet Take 1 tablet (5 mg total) by mouth 2 (two) times daily. (Patient taking differently: Take 5 mg by mouth every morning. ) 42 tablet 0  . donepezil (ARICEPT) 10 MG tablet TAKE ONE (1) TABLET AT BEDTIME 90 tablet 1  . furosemide (LASIX) 40 MG tablet TAKE 1 TABLET EVERY MORNING AND TAKE 1/2 TABLET AT LUNCH (Patient taking differently: TAKE 1 TABLET EVERY MORNING) 135 tablet 1  . hydrocortisone (PROCTOZONE-HC) 2.5 % rectal cream Place 1 application rectally 2 (two) times daily. 30 g 0  . levothyroxine (SYNTHROID, LEVOTHROID) 50 MCG  tablet TAKE ONE (1) TABLET EACH DAY 90 tablet 2  . memantine (NAMENDA) 10 MG tablet TAKE ONE TABLET BY MOUTH TWICE DAILY 180 tablet 1  . Multiple Vitamins-Minerals (MULTIVITAMIN WITH MINERALS) tablet Take 1 tablet by mouth daily.    . OXYGEN-HELIUM IN Inhale 4 L into the lungs continuous. 4L    . simvastatin (ZOCOR) 20 MG tablet TAKE ONE (1) TABLET EACH DAY 90 tablet 0  . traMADol (ULTRAM) 50 MG tablet Take 1 tablet (50 mg total) by mouth 3 (three) times daily as needed for moderate pain. 60 tablet 3   No current facility-administered medications for this visit.      Past Surgical History:  Procedure Laterality Date  . PACEMAKER INSERTION  08/13/10   SJM by Dr Rayann Heman     No Known Allergies    No family history on file.   Social History Ms. Barsanti reports that she has never smoked. She has never used smokeless tobacco. Ms. Flicker reports that she does not drink alcohol.   Review of Systems CONSTITUTIONAL: No weight loss, fever, chills, weakness or fatigue.  HEENT: Eyes: No visual loss, blurred vision, double vision or yellow sclerae.No hearing loss, sneezing, congestion, runny nose or sore throat.  SKIN: No rash or itching.  CARDIOVASCULAR:  RESPIRATORY: No shortness of breath, cough or sputum.  GASTROINTESTINAL: No anorexia, nausea, vomiting or diarrhea. No  abdominal pain or blood.  GENITOURINARY: No burning on urination, no polyuria NEUROLOGICAL: No headache, dizziness, syncope, paralysis, ataxia, numbness or tingling in the extremities. No change in bowel or bladder control.  MUSCULOSKELETAL: No muscle, back pain, joint pain or stiffness.  LYMPHATICS: No enlarged nodes. No history of splenectomy.  PSYCHIATRIC: No history of depression or anxiety.  ENDOCRINOLOGIC: No reports of sweating, cold or heat intolerance. No polyuria or polydipsia.  Marland Kitchen   Physical Examination There were no vitals filed for this visit. There were no vitals filed for this visit.  Gen:  resting comfortably, no acute distress HEENT: no scleral icterus, pupils equal round and reactive, no palptable cervical adenopathy,  CV Resp: Clear to auscultation bilaterally GI: abdomen is soft, non-tender, non-distended, normal bowel sounds, no hepatosplenomegaly MSK: extremities are warm, no edema.  Skin: warm, no rash Neuro:  no focal deficits Psych: appropriate affect   Diagnostic Studies 04/2014 Echo Study Conclusions  - Left ventricle: The cavity size was normal. Wall thickness was normal. Systolic function was normal. The estimated ejection fraction was in the range of 55% to 60%. Abnormal diastolic function, indeterminate grade. Wall motion was normal; there were no regional wall motion abnormalities. - Aortic valve: Mildly calcified annulus. Trileaflet; mildly thickened leaflets. There was mild stenosis. There was mild to moderate regurgitation. Mean gradient (S): 13 mm Hg. Valve area (VTI): 1.56 cm^2. Valve area (Vmax): 1.45 cm^2. Valve area (Vmean): 1.34 cm^2. Regurgitation pressure half-time: 542 ms. - Mitral valve: Mildly to moderately calcified annulus. Mildly thickened leaflets . There was moderate regurgitation. The MR VC is 0.5 cm. - Left atrium: The atrium was severely dilated. - Right ventricle: The cavity size was mildly dilated. - Right atrium: The atrium was severely dilated. - Tricuspid valve: There are 2 seperate TR jets. TR VC for jet 1 is 4.0 cm, the TR VC for jet 2 is 0.3 cm. The composite of the jets is moderate to severe TR. - Pulmonary arteries: Systolic pressure was moderately increased. PA peak pressure: 57 mm Hg (S). - Inferior vena cava: The vessel was normal in size. The respirophasic diameter changes were in the normal range (>= 50%), consistent with normal central venous pressure. - Technically adequate study.     Assessment and Plan   1. Heart block - pacemaker management per Dr Rayann Heman  2.  Valvular heart disease - moderate MR and TR, continue to monitor  3. LE edema - continue compression stockings, diuretics.   4. PAF - no current symptoms - CHADS2vasc score is 3, restart eliquis. Will give samples, asked to contact if cost remains an issue - EKG in clinic shows v-paced rhythm  F/u 6 months     Arnoldo Lenis, M.D., F.A.C.C.

## 2016-06-10 ENCOUNTER — Ambulatory Visit (HOSPITAL_COMMUNITY): Payer: PPO | Admitting: Physical Therapy

## 2016-06-10 ENCOUNTER — Encounter: Payer: Self-pay | Admitting: Cardiology

## 2016-06-10 ENCOUNTER — Telehealth (HOSPITAL_COMMUNITY): Payer: Self-pay | Admitting: Family Medicine

## 2016-06-10 ENCOUNTER — Ambulatory Visit (INDEPENDENT_AMBULATORY_CARE_PROVIDER_SITE_OTHER): Payer: PPO | Admitting: Cardiology

## 2016-06-10 VITALS — BP 125/71 | HR 82 | Ht 59.0 in | Wt 139.6 lb

## 2016-06-10 DIAGNOSIS — I4891 Unspecified atrial fibrillation: Secondary | ICD-10-CM | POA: Diagnosis not present

## 2016-06-10 DIAGNOSIS — G309 Alzheimer's disease, unspecified: Secondary | ICD-10-CM | POA: Diagnosis not present

## 2016-06-10 DIAGNOSIS — I442 Atrioventricular block, complete: Secondary | ICD-10-CM

## 2016-06-10 DIAGNOSIS — L97811 Non-pressure chronic ulcer of other part of right lower leg limited to breakdown of skin: Secondary | ICD-10-CM | POA: Diagnosis not present

## 2016-06-10 DIAGNOSIS — I38 Endocarditis, valve unspecified: Secondary | ICD-10-CM | POA: Diagnosis not present

## 2016-06-10 DIAGNOSIS — M069 Rheumatoid arthritis, unspecified: Secondary | ICD-10-CM | POA: Diagnosis not present

## 2016-06-10 DIAGNOSIS — I890 Lymphedema, not elsewhere classified: Secondary | ICD-10-CM | POA: Diagnosis not present

## 2016-06-10 DIAGNOSIS — I5189 Other ill-defined heart diseases: Secondary | ICD-10-CM | POA: Diagnosis not present

## 2016-06-10 DIAGNOSIS — I420 Dilated cardiomyopathy: Secondary | ICD-10-CM | POA: Diagnosis not present

## 2016-06-10 DIAGNOSIS — I70203 Unspecified atherosclerosis of native arteries of extremities, bilateral legs: Secondary | ICD-10-CM | POA: Diagnosis not present

## 2016-06-10 DIAGNOSIS — I480 Paroxysmal atrial fibrillation: Secondary | ICD-10-CM | POA: Diagnosis not present

## 2016-06-10 DIAGNOSIS — I10 Essential (primary) hypertension: Secondary | ICD-10-CM | POA: Diagnosis not present

## 2016-06-10 DIAGNOSIS — I872 Venous insufficiency (chronic) (peripheral): Secondary | ICD-10-CM | POA: Diagnosis not present

## 2016-06-10 DIAGNOSIS — J984 Other disorders of lung: Secondary | ICD-10-CM | POA: Diagnosis not present

## 2016-06-10 DIAGNOSIS — F0280 Dementia in other diseases classified elsewhere without behavioral disturbance: Secondary | ICD-10-CM | POA: Diagnosis not present

## 2016-06-10 DIAGNOSIS — Z9981 Dependence on supplemental oxygen: Secondary | ICD-10-CM | POA: Diagnosis not present

## 2016-06-10 DIAGNOSIS — I272 Pulmonary hypertension, unspecified: Secondary | ICD-10-CM | POA: Diagnosis not present

## 2016-06-10 DIAGNOSIS — G4730 Sleep apnea, unspecified: Secondary | ICD-10-CM | POA: Diagnosis not present

## 2016-06-10 DIAGNOSIS — E669 Obesity, unspecified: Secondary | ICD-10-CM | POA: Diagnosis not present

## 2016-06-10 DIAGNOSIS — E039 Hypothyroidism, unspecified: Secondary | ICD-10-CM | POA: Diagnosis not present

## 2016-06-10 DIAGNOSIS — I739 Peripheral vascular disease, unspecified: Secondary | ICD-10-CM | POA: Diagnosis not present

## 2016-06-10 DIAGNOSIS — Z8673 Personal history of transient ischemic attack (TIA), and cerebral infarction without residual deficits: Secondary | ICD-10-CM | POA: Diagnosis not present

## 2016-06-10 NOTE — Progress Notes (Signed)
Clinical Summary Ms. Bruster is a 83 y.o.female seen today for follow up of the following medical problems.   1. Heart block - pacemaker followed by Dr Rayann Heman - she denies any lightheadedness or dizziness, no syncope  2. PAF - no recent palpitations - no bleeding on eliquis.   3. Valvular heart disease - no recent SOB/DOE. Can have some occasional LE edema  4. Leg swelling - compliant with compression stockings and diuretics  5. Leg wounds - followed by vascular. From last note plans for angiography   Past Medical History:  Diagnosis Date  . Chronic lung disease    on home O2   . Complete heart block (HCC)    s/p PPM by Dr Rayann Heman  . Debilitated   . Diastolic dysfunction   . Hypertension   . Lymphedema of right lower extremity   . MI (mitral incompetence)   . Obesity   . Permanent atrial fibrillation ([place])   . Pulmonary hypertension ([place])   . Rheumatoid arthritis(714.0)   . Sleep apnea   . Stroke [facility])    remote  . Venous insufficiency    with chronic leg ulcers     No Known Allergies   Current Outpatient Prescriptions  Medication Sig Dispense Refill  . amoxicillin-clavulanate (AUGMENTIN) 875-125 MG tablet Take 1 tablet by mouth 2 (two) times daily. Take all of this medication 20 tablet 0  . apixaban (ELIQUIS) 5 MG TABS tablet Take 1 tablet (5 mg total) by mouth 2 (two) times daily. (Patient taking differently: Take 5 mg by mouth every morning. ) 42 tablet 0  . donepezil (ARICEPT) 10 MG tablet TAKE ONE (1) TABLET AT BEDTIME 90 tablet 1  . furosemide (LASIX) 40 MG tablet TAKE 1 TABLET EVERY MORNING AND TAKE 1/2 TABLET AT LUNCH (Patient taking differently: TAKE 1 TABLET EVERY MORNING) 135 tablet 1  . hydrocortisone (PROCTOZONE-HC) 2.5 % rectal cream Place 1 application rectally 2 (two) times daily. 30 g 0  . levothyroxine (SYNTHROID, LEVOTHROID) 50 MCG tablet TAKE ONE (1) TABLET EACH DAY 90 tablet 2  . memantine (NAMENDA) 10 MG tablet TAKE ONE TABLET  BY MOUTH TWICE DAILY 180 tablet 1  . Multiple Vitamins-Minerals (MULTIVITAMIN WITH MINERALS) tablet Take 1 tablet by mouth daily.    . OXYGEN-HELIUM IN Inhale 4 L into the lungs continuous. 4L    . simvastatin (ZOCOR) 20 MG tablet TAKE ONE (1) TABLET EACH DAY 90 tablet 0  . traMADol (ULTRAM) 50 MG tablet Take 1 tablet (50 mg total) by mouth 3 (three) times daily as needed for moderate pain. 60 tablet 3   No current facility-administered medications for this visit.      Past Surgical History:  Procedure Laterality Date  . PACEMAKER INSERTION  08/13/10   SJM by Dr Rayann Heman     No Known Allergies    No family history on file.   Social History Ms. Southgate reports that she has never smoked. She has never used smokeless tobacco. Ms. Natividad reports that she does not drink alcohol.   Review of Systems CONSTITUTIONAL: No weight loss, fever, chills, weakness or fatigue.  HEENT: Eyes: No visual loss, blurred vision, double vision or yellow sclerae.No hearing loss, sneezing, congestion, runny nose or sore throat.  SKIN: No rash or itching.  CARDIOVASCULAR: per HPI RESPIRATORY: No shortness of breath, cough or sputum.  GASTROINTESTINAL: No anorexia, nausea, vomiting or diarrhea. No abdominal pain or blood.  GENITOURINARY: No burning on urination, no polyuria NEUROLOGICAL: No  headache, dizziness, syncope, paralysis, ataxia, numbness or tingling in the extremities. No change in bowel or bladder control.  MUSCULOSKELETAL: No muscle, back pain, joint pain or stiffness.  LYMPHATICS: No enlarged nodes. No history of splenectomy.  PSYCHIATRIC: No history of depression or anxiety.  ENDOCRINOLOGIC: No reports of sweating, cold or heat intolerance. No polyuria or polydipsia.  Marland Kitchen   Physical Examination Vitals:   06/10/16 1517  BP: 125/71  Pulse: 82   Vitals:   06/10/16 1517  Weight: 139 lb 9.6 oz (63.3 kg)  Height: 4\' 11"  (1.499 m)    Gen: resting comfortably, no acute  distress HEENT: no scleral icterus, pupils equal round and reactive, no palptable cervical adenopathy,  CV: RRR, 2/6 systolic murmur at apex, no jvd Resp: Clear to auscultation bilaterally GI: abdomen is soft, non-tender, non-distended, normal bowel sounds, no hepatosplenomegaly MSK: extremities are warm, no edema.  Skin: warm, no rash Neuro:  no focal deficits Psych: appropriate affect   Diagnostic Studies 04/2014 Echo Study Conclusions  - Left ventricle: The cavity size was normal. Wall thickness was normal. Systolic function was normal. The estimated ejection fraction was in the range of 55% to 60%. Abnormal diastolic function, indeterminate grade. Wall motion was normal; there were no regional wall motion abnormalities. - Aortic valve: Mildly calcified annulus. Trileaflet; mildly thickened leaflets. There was mild stenosis. There was mild to moderate regurgitation. Mean gradient (S): 13 mm Hg. Valve area (VTI): 1.56 cm^2. Valve area (Vmax): 1.45 cm^2. Valve area (Vmean): 1.34 cm^2. Regurgitation pressure half-time: 542 ms. - Mitral valve: Mildly to moderately calcified annulus. Mildly thickened leaflets . There was moderate regurgitation. The MR VC is 0.5 cm. - Left atrium: The atrium was severely dilated. - Right ventricle: The cavity size was mildly dilated. - Right atrium: The atrium was severely dilated. - Tricuspid valve: There are 2 seperate TR jets. TR VC for jet 1 is 4.0 cm, the TR VC for jet 2 is 0.3 cm. The composite of the jets is moderate to severe TR. - Pulmonary arteries: Systolic pressure was moderately increased. PA peak pressure: 57 mm Hg (S). - Inferior vena cava: The vessel was normal in size. The respirophasic diameter changes were in the normal range (>= 50%), consistent with normal central venous pressure. - Technically adequate study.     Assessment and Plan   1. Heart block - pacemaker management per Dr Rayann Heman -  no recent symptoms - continue to monitor.   2. Valvular heart disease - moderate MR and TR - continue to follow clinically  3. LE edema - she will continue compression stockings and her diuretics.   4. PAF - no current symptoms - CHADS2vasc score is 3, continue eliquis  5. PAD - no contraindicationfor angioraphy from cardiology standpoing, would hold eliquis 2 days prior.    F/u 4 months     Arnoldo Lenis, M.D., F.A.C.C.

## 2016-06-10 NOTE — Patient Instructions (Signed)
Your physician recommends that you schedule a follow-up appointment in: 4 MONTHS WITH DR BRANCH  Your physician recommends that you continue on your current medications as directed. Please refer to the Current Medication list given to you today.  Thank you for choosing [facility]!!    

## 2016-06-10 NOTE — Telephone Encounter (Signed)
06/10/16 daughter Jenny Reichmann called and said she was on the way here but home health called and said they were on their way to see the patient.  Jenny Reichmann said she wanted to keep the 5/17 appointment to see what they tell her today ... She will call us back.

## 2016-06-11 ENCOUNTER — Telehealth (HOSPITAL_COMMUNITY): Payer: Self-pay | Admitting: Family Medicine

## 2016-06-11 ENCOUNTER — Telehealth: Payer: Self-pay | Admitting: Family Medicine

## 2016-06-11 ENCOUNTER — Other Ambulatory Visit: Payer: Self-pay | Admitting: Family Medicine

## 2016-06-11 DIAGNOSIS — L97911 Non-pressure chronic ulcer of unspecified part of right lower leg limited to breakdown of skin: Secondary | ICD-10-CM

## 2016-06-11 MED ORDER — TRAMADOL HCL 50 MG PO TABS: 50.0000 mg | ORAL_TABLET | Freq: Three times a day (TID) | ORAL | 3 refills | Status: DC | PRN

## 2016-06-11 NOTE — Telephone Encounter (Signed)
Scrip okayed, entered in computer. Please call in. (Preferably see if Ms. Wynetta Emery will pick it up - daughter) Thanks, Cletus Gash

## 2016-06-11 NOTE — Telephone Encounter (Signed)
06/11/16 daughter Jenny Reichmann called and left a message that home health had kicked in and the patient was also referred to the wound center in [place] for other measures to be taken.

## 2016-06-11 NOTE — Telephone Encounter (Signed)
Per pt's daughter, pt needs referral to [facility] wound care Referral entered in Epic

## 2016-06-11 NOTE — Telephone Encounter (Signed)
Pt's daughter notified script is ready for pick up RX to front

## 2016-06-11 NOTE — Telephone Encounter (Signed)
What is the name of the medication? tramadol  Have you contacted your pharmacy to request a refill? no  Which pharmacy would you like this sent to? The drug store in [place].   Patient notified that their request is being sent to the clinical staff for review and that they should receive a call once it is complete. If they do not receive a call within 24 hours they can check with their pharmacy or our office.

## 2016-06-12 ENCOUNTER — Ambulatory Visit (HOSPITAL_COMMUNITY): Payer: PPO | Admitting: Physical Therapy

## 2016-06-14 ENCOUNTER — Other Ambulatory Visit: Payer: Self-pay | Admitting: Family Medicine

## 2016-06-23 DIAGNOSIS — J449 Chronic obstructive pulmonary disease, unspecified: Secondary | ICD-10-CM | POA: Diagnosis not present

## 2016-07-08 DIAGNOSIS — L928 Other granulomatous disorders of the skin and subcutaneous tissue: Secondary | ICD-10-CM | POA: Diagnosis not present

## 2016-07-08 DIAGNOSIS — Z9981 Dependence on supplemental oxygen: Secondary | ICD-10-CM | POA: Diagnosis not present

## 2016-07-08 DIAGNOSIS — Z79899 Other long term (current) drug therapy: Secondary | ICD-10-CM | POA: Diagnosis not present

## 2016-07-08 DIAGNOSIS — L97819 Non-pressure chronic ulcer of other part of right lower leg with unspecified severity: Secondary | ICD-10-CM | POA: Diagnosis not present

## 2016-07-08 DIAGNOSIS — I4891 Unspecified atrial fibrillation: Secondary | ICD-10-CM | POA: Diagnosis not present

## 2016-07-08 DIAGNOSIS — I83018 Varicose veins of right lower extremity with ulcer other part of lower leg: Secondary | ICD-10-CM | POA: Diagnosis not present

## 2016-07-08 DIAGNOSIS — E039 Hypothyroidism, unspecified: Secondary | ICD-10-CM | POA: Diagnosis not present

## 2016-07-08 DIAGNOSIS — Z7901 Long term (current) use of anticoagulants: Secondary | ICD-10-CM | POA: Diagnosis not present

## 2016-07-08 DIAGNOSIS — I252 Old myocardial infarction: Secondary | ICD-10-CM | POA: Diagnosis not present

## 2016-07-08 DIAGNOSIS — I10 Essential (primary) hypertension: Secondary | ICD-10-CM | POA: Diagnosis not present

## 2016-07-08 DIAGNOSIS — I739 Peripheral vascular disease, unspecified: Secondary | ICD-10-CM | POA: Diagnosis not present

## 2016-07-08 DIAGNOSIS — Z950 Presence of cardiac pacemaker: Secondary | ICD-10-CM | POA: Diagnosis not present

## 2016-07-08 DIAGNOSIS — M069 Rheumatoid arthritis, unspecified: Secondary | ICD-10-CM | POA: Diagnosis not present

## 2016-07-08 DIAGNOSIS — J449 Chronic obstructive pulmonary disease, unspecified: Secondary | ICD-10-CM | POA: Diagnosis not present

## 2016-07-08 DIAGNOSIS — F0390 Unspecified dementia without behavioral disturbance: Secondary | ICD-10-CM | POA: Diagnosis not present

## 2016-07-08 DIAGNOSIS — I872 Venous insufficiency (chronic) (peripheral): Secondary | ICD-10-CM | POA: Diagnosis not present

## 2016-07-15 ENCOUNTER — Other Ambulatory Visit: Payer: Self-pay | Admitting: Family Medicine

## 2016-07-15 DIAGNOSIS — L97919 Non-pressure chronic ulcer of unspecified part of right lower leg with unspecified severity: Secondary | ICD-10-CM | POA: Diagnosis not present

## 2016-07-15 DIAGNOSIS — I83018 Varicose veins of right lower extremity with ulcer other part of lower leg: Secondary | ICD-10-CM | POA: Diagnosis not present

## 2016-07-15 DIAGNOSIS — L928 Other granulomatous disorders of the skin and subcutaneous tissue: Secondary | ICD-10-CM | POA: Diagnosis not present

## 2016-07-17 DIAGNOSIS — I83018 Varicose veins of right lower extremity with ulcer other part of lower leg: Secondary | ICD-10-CM | POA: Diagnosis not present

## 2016-07-17 DIAGNOSIS — L928 Other granulomatous disorders of the skin and subcutaneous tissue: Secondary | ICD-10-CM | POA: Diagnosis not present

## 2016-07-18 ENCOUNTER — Encounter: Payer: Self-pay | Admitting: Family Medicine

## 2016-07-18 ENCOUNTER — Ambulatory Visit (INDEPENDENT_AMBULATORY_CARE_PROVIDER_SITE_OTHER): Payer: PPO | Admitting: Family Medicine

## 2016-07-18 VITALS — BP 121/55 | HR 81 | Temp 98.2°F | Ht 59.0 in | Wt 143.0 lb

## 2016-07-18 DIAGNOSIS — I4821 Permanent atrial fibrillation: Secondary | ICD-10-CM

## 2016-07-18 DIAGNOSIS — G301 Alzheimer's disease with late onset: Secondary | ICD-10-CM | POA: Diagnosis not present

## 2016-07-18 DIAGNOSIS — I872 Venous insufficiency (chronic) (peripheral): Secondary | ICD-10-CM | POA: Diagnosis not present

## 2016-07-18 DIAGNOSIS — I10 Essential (primary) hypertension: Secondary | ICD-10-CM | POA: Diagnosis not present

## 2016-07-18 DIAGNOSIS — E039 Hypothyroidism, unspecified: Secondary | ICD-10-CM | POA: Diagnosis not present

## 2016-07-18 DIAGNOSIS — F0280 Dementia in other diseases classified elsewhere without behavioral disturbance: Secondary | ICD-10-CM | POA: Diagnosis not present

## 2016-07-18 DIAGNOSIS — I482 Chronic atrial fibrillation: Secondary | ICD-10-CM

## 2016-07-18 DIAGNOSIS — L97912 Non-pressure chronic ulcer of unspecified part of right lower leg with fat layer exposed: Secondary | ICD-10-CM

## 2016-07-18 DIAGNOSIS — I70239 Atherosclerosis of native arteries of right leg with ulceration of unspecified site: Secondary | ICD-10-CM

## 2016-07-18 DIAGNOSIS — I442 Atrioventricular block, complete: Secondary | ICD-10-CM | POA: Diagnosis not present

## 2016-07-18 DIAGNOSIS — Z950 Presence of cardiac pacemaker: Secondary | ICD-10-CM

## 2016-07-18 DIAGNOSIS — I70249 Atherosclerosis of native arteries of left leg with ulceration of unspecified site: Secondary | ICD-10-CM | POA: Diagnosis not present

## 2016-07-18 MED ORDER — FLUTICASONE FUROATE-VILANTEROL 200-25 MCG/INH IN AEPB: 1.0000 | INHALATION_SPRAY | Freq: Every day | RESPIRATORY_TRACT | 11 refills | Status: DC

## 2016-07-18 NOTE — Progress Notes (Signed)
Subjective:  Patient ID: [name], female    DOB: March 03, 1933  Age: 83 y.o. MRN: [id number]  CC: Follow-up (pt here today for routine follow up and saw Wound Care in [facility] yesterday.)   HPI [name] presents for  follow-up of Multiple conditions including hypertension. History given entire by the daughter who is with her, [name]. Patient has no history of headache chest pain or shortness of breath or recent cough. Patient also denies symptoms of TIA such as numbness weakness lateralizing. Patient checks  blood pressure at home and has not had any elevated readings recently. Patient denies side effects from medication. States taking it regularly.  Daughter has noticed that she is getting more short of breath on exertion. Some of it is related to the heat she feels but she also notes that she is like that walking in to the doctor's offices in the last few days such as wound care center even though some of those appointments are fairly early in the morning before it gets extremely hot.  Patient presents for follow-up on  thyroid. The patient has a history of hypothyroidism for many years. It has been stable recently. Pt. denies any change in  voice, loss of hair, heat or cold intolerance. Energy level has been adequate to good. Patient denies constipation and diarrhea. No myxedema. Medication is as noted below. Verified that pt is taking it daily on an empty stomach. Well tolerated.  Patient is under the care of the wounds Center in [place] now. Their doctors prescribing medications for the legs including dressings and antibiotics. The patient was cultured and found to have pseudomonas and MRSA. He has started her on clindamycin 300 mg 4 times a day and Cipro 500 mg twice a day. They applied a fresh dressing yesterday. Daughter requests that we not disturb it today.   History [name] has a past medical history of Chronic lung disease; Complete heart block ([place]); Debilitated;  Diastolic dysfunction; Hypertension; Lymphedema of right lower extremity; MI (mitral incompetence); Obesity; Permanent atrial fibrillation ([place]); Pulmonary hypertension ([place]); Rheumatoid arthritis(714.0); Sleep apnea; Stroke [facility]); and Venous insufficiency.   She has a past surgical history that includes Pacemaker insertion (08/13/10).   Her family history includes Alzheimer's disease in her mother; Colon cancer in her mother; Congestive Heart Failure in her father.She reports that she has never smoked. She has never used smokeless tobacco. She reports that she does not drink alcohol or use drugs.  Current Outpatient Prescriptions on File Prior to Visit  Medication Sig Dispense Refill  . apixaban (ELIQUIS) 5 MG TABS tablet Take 1 tablet (5 mg total) by mouth 2 (two) times daily. (Patient taking differently: Take 5 mg by mouth every morning. ) 42 tablet 0  . donepezil (ARICEPT) 10 MG tablet TAKE ONE (1) TABLET AT BEDTIME 90 tablet 1  . ELIQUIS 5 MG TABS tablet TAKE ONE TABLET BY MOUTH TWICE DAILY 180 tablet 0  . furosemide (LASIX) 40 MG tablet TAKE 1 TABLET EVERY MORNING AND TAKE 1/2 TABLET AT LUNCH (Patient taking differently: TAKE 1 TABLET EVERY MORNING) 135 tablet 1  . hydrocortisone (PROCTOZONE-HC) 2.5 % rectal cream Place 1 application rectally 2 (two) times daily. 30 g 0  . levothyroxine (SYNTHROID, LEVOTHROID) 50 MCG tablet TAKE ONE (1) TABLET EACH DAY 90 tablet 2  . memantine (NAMENDA) 10 MG tablet TAKE ONE TABLET BY MOUTH TWICE DAILY 180 tablet 1  . Multiple Vitamins-Minerals (MULTIVITAMIN WITH MINERALS) tablet Take 1 tablet by mouth  daily.    . OXYGEN-HELIUM IN Inhale 4 L into the lungs continuous. 4L    . simvastatin (ZOCOR) 20 MG tablet TAKE ONE (1) TABLET EACH DAY 90 tablet 0  . traMADol (ULTRAM) 50 MG tablet Take 1 tablet (50 mg total) by mouth 3 (three) times daily as needed for moderate pain. 60 tablet 3   No current facility-administered medications on file prior to visit.      ROS Review of Systems  Constitutional: Negative for activity change, appetite change and fever.  HENT: Negative for congestion, rhinorrhea and sore throat.   Eyes: Negative for visual disturbance.  Respiratory: Positive for shortness of breath. Negative for cough.   Cardiovascular: Negative for chest pain and palpitations.  Gastrointestinal: Negative for abdominal pain, diarrhea and nausea.  Genitourinary: Negative for dysuria.  Musculoskeletal: Negative for arthralgias and myalgias.  Psychiatric/Behavioral: Positive for confusion.    Objective:  BP (!) 121/55   Pulse 81   Temp 98.2 F (36.8 C) (Oral)   Ht 4' 11"  (1.499 m)   Wt 143 lb (64.9 kg)   SpO2 100% Comment: on 4L of oxygen  BMI 28.88 kg/m   BP Readings from Last 3 Encounters:  07/18/16 (!) 121/55  06/10/16 125/71  05/28/16 120/71    Wt Readings from Last 3 Encounters:  07/18/16 143 lb (64.9 kg)  06/10/16 139 lb 9.6 oz (63.3 kg)  05/28/16 138 lb (62.6 kg)     Physical Exam  Constitutional: She is oriented to person, place, and time. She appears well-developed and well-nourished. No distress.  HENT:  Head: Normocephalic and atraumatic.  Right Ear: External ear normal.  Left Ear: External ear normal.  Nose: Nose normal.  Mouth/Throat: Oropharynx is clear and moist.  Eyes: Conjunctivae and EOM are normal. Pupils are equal, round, and reactive to light.  Neck: Normal range of motion. Neck supple. No thyromegaly present.  Cardiovascular: Normal rate, regular rhythm and normal heart sounds.   No murmur heard. Pulmonary/Chest: Effort normal and breath sounds normal. No respiratory distress. She has no wheezes. She has no rales.  Abdominal: Soft. Bowel sounds are normal. She exhibits no distension. There is no tenderness.  Lymphadenopathy:    She has no cervical adenopathy.  Neurological: She is alert and oriented to person, place, and time. She has normal reflexes.  Skin: Skin is warm and dry.   Psychiatric: Her affect is blunt. Her speech is delayed. She is slowed. Cognition and memory are impaired. She exhibits abnormal recent memory and abnormal remote memory.     Lab Results  Component Value Date   WBC 11.0 (H) 04/05/2016   HGB 9.3 (L) 04/05/2016   HCT 29.3 (L) 04/05/2016   PLT 322 04/05/2016   GLUCOSE 156 (H) 04/17/2016   CHOL 203 (H) 10/11/2014   TRIG 93 10/11/2014   HDL 70 10/11/2014   LDLCALC 114 (H) 10/11/2014   ALT 10 04/17/2016   AST 20 04/17/2016   NA 141 04/17/2016   K 4.7 04/17/2016   CL 95 (L) 04/17/2016   CREATININE 1.12 (H) 04/17/2016   BUN 22 04/17/2016   CO2 30 (H) 04/17/2016   TSH 3.050 03/26/2016   INR 1.26 08/12/2010    No results found.  Assessment & Plan:   Rhyli was seen today for follow-up.  Diagnoses and all orders for this visit:  Permanent atrial fibrillation ([place]) -     CBC with Differential/Platelet -     CMP14+EGFR -     Lipid panel  Complete heart block (HCC) -     CBC with Differential/Platelet -     CMP14+EGFR -     Lipid panel  Pacemaker-St.[employer]ude -     CBC with Differential/Platelet -     CMP14+EGFR -     Lipid panel  Stasis dermatitis of both legs -     CBC with Differential/Platelet -     CMP14+EGFR -     Lipid panel  Chronic ulcer of right lower extremity with fat layer exposed (HCC) -     CBC with Differential/Platelet -     CMP14+EGFR -     Lipid panel  Late onset Alzheimer's disease without behavioral disturbance -     CBC with Differential/Platelet -     CMP14+EGFR -     Lipid panel  Essential hypertension -     CBC with Differential/Platelet -     CMP14+EGFR -     Lipid panel  Atherosclerosis of native artery of both lower extremities with bilateral ulceration, unspecified ulceration site (HCC) -     CBC with Differential/Platelet -     CMP14+EGFR -     Lipid panel  Hypothyroidism, unspecified type -     CBC with Differential/Platelet -     CMP14+EGFR -     Lipid panel  Other orders -      fluticasone furoate-vilanterol (BREO ELLIPTA) 200-25 MCG/INH AEPB; Inhale 1 puff into the lungs daily.   I have discontinued Ms. Mccalla's amoxicillin-clavulanate. I am also having her start on fluticasone furoate-vilanterol. Additionally, I am having her maintain her OXYGEN-HELIUM IN, multivitamin with minerals, hydrocortisone, apixaban, furosemide, donepezil, memantine, traMADol, levothyroxine, ELIQUIS, simvastatin, ciprofloxacin, and clindamycin.  Meds ordered this encounter  Medications  . ciprofloxacin (CIPRO) 500 MG tablet    Sig: Take 500 mg by mouth 2 (two) times daily.  . clindamycin (CLEOCIN) 300 MG capsule    Sig: Take 300 mg by mouth 4 (four) times daily.  . fluticasone furoate-vilanterol (BREO ELLIPTA) 200-25 MCG/INH AEPB    Sig: Inhale 1 puff into the lungs daily.    Dispense:  1 each    Refill:  11      Follow-up: Return in about 3 months (around 10/18/2016).  [name], M.D.

## 2016-07-19 LAB — CMP14+EGFR
ALT: 7 IU/L (ref 0–32)
AST: 14 IU/L (ref 0–40)
Albumin/Globulin Ratio: 1.4 (ref 1.2–2.2)
Albumin: 3.8 g/dL (ref 3.5–4.7)
Alkaline Phosphatase: 62 IU/L (ref 39–117)
BUN/Creatinine Ratio: 20 (ref 12–28)
BUN: 26 mg/dL (ref 8–27)
CALCIUM: 9.1 mg/dL (ref 8.7–10.3)
CHLORIDE: 101 mmol/L (ref 96–106)
CO2: 30 mmol/L — ABNORMAL HIGH (ref 20–29)
Creatinine, Ser: 1.28 mg/dL — ABNORMAL HIGH (ref 0.57–1.00)
GFR calc Af Amer: 45 mL/min/{1.73_m2} — ABNORMAL LOW (ref >59)
GFR, EST NON AFRICAN AMERICAN: 39 mL/min/{1.73_m2} — AB (ref >59)
GLUCOSE: 88 mg/dL (ref 65–99)
Globulin, Total: 2.7 g/dL (ref 1.5–4.5)
POTASSIUM: 5.2 mmol/L (ref 3.5–5.2)
Sodium: 144 mmol/L (ref 134–144)
TOTAL PROTEIN: 6.5 g/dL (ref 6.0–8.5)

## 2016-07-19 LAB — LIPID PANEL
Chol/HDL Ratio: 2.2 ratio (ref 0.0–4.4)
Cholesterol, Total: 145 mg/dL (ref 100–199)
HDL: 65 mg/dL (ref >39)
LDL Calculated: 55 mg/dL (ref 0–99)
TRIGLYCERIDES: 124 mg/dL (ref 0–149)
VLDL CHOLESTEROL CAL: 25 mg/dL (ref 5–40)

## 2016-07-19 LAB — CBC WITH DIFFERENTIAL/PLATELET
BASOS ABS: 0 10*3/uL (ref 0.0–0.2)
Basos: 0 %
EOS (ABSOLUTE): 0.1 10*3/uL (ref 0.0–0.4)
Eos: 1 %
Hematocrit: 27.6 % — ABNORMAL LOW (ref 34.0–46.6)
Hemoglobin: 8.7 g/dL — CL (ref 11.1–15.9)
IMMATURE GRANS (ABS): 0 10*3/uL (ref 0.0–0.1)
IMMATURE GRANULOCYTES: 0 %
LYMPHS: 22 %
Lymphocytes Absolute: 2.5 10*3/uL (ref 0.7–3.1)
MCH: 27.4 pg (ref 26.6–33.0)
MCHC: 31.5 g/dL (ref 31.5–35.7)
MCV: 87 fL (ref 79–97)
MONOS ABS: 1 10*3/uL — AB (ref 0.1–0.9)
Monocytes: 9 %
NEUTROS PCT: 68 %
Neutrophils Absolute: 7.6 10*3/uL — ABNORMAL HIGH (ref 1.4–7.0)
PLATELETS: 337 10*3/uL (ref 150–379)
RBC: 3.17 x10E6/uL — ABNORMAL LOW (ref 3.77–5.28)
RDW: 15.6 % — AB (ref 12.3–15.4)
WBC: 11.2 10*3/uL — AB (ref 3.4–10.8)

## 2016-07-22 DIAGNOSIS — L928 Other granulomatous disorders of the skin and subcutaneous tissue: Secondary | ICD-10-CM | POA: Diagnosis not present

## 2016-07-22 DIAGNOSIS — I83018 Varicose veins of right lower extremity with ulcer other part of lower leg: Secondary | ICD-10-CM | POA: Diagnosis not present

## 2016-07-24 DIAGNOSIS — J449 Chronic obstructive pulmonary disease, unspecified: Secondary | ICD-10-CM | POA: Diagnosis not present

## 2016-07-24 LAB — IRON AND TIBC
IRON SATURATION: 11 % — AB (ref 15–55)
IRON: 28 ug/dL (ref 27–139)
Total Iron Binding Capacity: 257 ug/dL (ref 250–450)
UIBC: 229 ug/dL (ref 118–369)

## 2016-07-24 LAB — SPECIMEN STATUS REPORT

## 2016-07-24 LAB — FERRITIN: Ferritin: 80 ng/mL (ref 15–150)

## 2016-07-29 DIAGNOSIS — I83018 Varicose veins of right lower extremity with ulcer other part of lower leg: Secondary | ICD-10-CM | POA: Diagnosis not present

## 2016-07-29 DIAGNOSIS — L928 Other granulomatous disorders of the skin and subcutaneous tissue: Secondary | ICD-10-CM | POA: Diagnosis not present

## 2016-07-29 DIAGNOSIS — I872 Venous insufficiency (chronic) (peripheral): Secondary | ICD-10-CM | POA: Diagnosis not present

## 2016-08-01 ENCOUNTER — Encounter: Payer: PPO | Admitting: Internal Medicine

## 2016-08-07 ENCOUNTER — Other Ambulatory Visit: Payer: Self-pay | Admitting: Family Medicine

## 2016-08-09 DIAGNOSIS — I4891 Unspecified atrial fibrillation: Secondary | ICD-10-CM | POA: Diagnosis not present

## 2016-08-09 DIAGNOSIS — E039 Hypothyroidism, unspecified: Secondary | ICD-10-CM | POA: Diagnosis not present

## 2016-08-09 DIAGNOSIS — I10 Essential (primary) hypertension: Secondary | ICD-10-CM | POA: Diagnosis not present

## 2016-08-09 DIAGNOSIS — G4730 Sleep apnea, unspecified: Secondary | ICD-10-CM | POA: Diagnosis not present

## 2016-08-09 DIAGNOSIS — I420 Dilated cardiomyopathy: Secondary | ICD-10-CM | POA: Diagnosis not present

## 2016-08-09 DIAGNOSIS — Z8673 Personal history of transient ischemic attack (TIA), and cerebral infarction without residual deficits: Secondary | ICD-10-CM | POA: Diagnosis not present

## 2016-08-09 DIAGNOSIS — J984 Other disorders of lung: Secondary | ICD-10-CM | POA: Diagnosis not present

## 2016-08-09 DIAGNOSIS — E669 Obesity, unspecified: Secondary | ICD-10-CM | POA: Diagnosis not present

## 2016-08-09 DIAGNOSIS — F0280 Dementia in other diseases classified elsewhere without behavioral disturbance: Secondary | ICD-10-CM | POA: Diagnosis not present

## 2016-08-09 DIAGNOSIS — I890 Lymphedema, not elsewhere classified: Secondary | ICD-10-CM | POA: Diagnosis not present

## 2016-08-09 DIAGNOSIS — M069 Rheumatoid arthritis, unspecified: Secondary | ICD-10-CM | POA: Diagnosis not present

## 2016-08-09 DIAGNOSIS — I70203 Unspecified atherosclerosis of native arteries of extremities, bilateral legs: Secondary | ICD-10-CM | POA: Diagnosis not present

## 2016-08-09 DIAGNOSIS — I872 Venous insufficiency (chronic) (peripheral): Secondary | ICD-10-CM | POA: Diagnosis not present

## 2016-08-09 DIAGNOSIS — I5189 Other ill-defined heart diseases: Secondary | ICD-10-CM | POA: Diagnosis not present

## 2016-08-09 DIAGNOSIS — G309 Alzheimer's disease, unspecified: Secondary | ICD-10-CM | POA: Diagnosis not present

## 2016-08-09 DIAGNOSIS — Z9981 Dependence on supplemental oxygen: Secondary | ICD-10-CM | POA: Diagnosis not present

## 2016-08-09 DIAGNOSIS — L97811 Non-pressure chronic ulcer of other part of right lower leg limited to breakdown of skin: Secondary | ICD-10-CM | POA: Diagnosis not present

## 2016-08-12 DIAGNOSIS — L928 Other granulomatous disorders of the skin and subcutaneous tissue: Secondary | ICD-10-CM | POA: Diagnosis not present

## 2016-08-12 DIAGNOSIS — I83018 Varicose veins of right lower extremity with ulcer other part of lower leg: Secondary | ICD-10-CM | POA: Diagnosis not present

## 2016-08-22 ENCOUNTER — Encounter: Payer: Self-pay | Admitting: Internal Medicine

## 2016-08-22 ENCOUNTER — Ambulatory Visit (INDEPENDENT_AMBULATORY_CARE_PROVIDER_SITE_OTHER): Payer: PPO | Admitting: Internal Medicine

## 2016-08-22 VITALS — BP 120/74 | HR 74 | Ht 64.0 in | Wt 144.0 lb

## 2016-08-22 DIAGNOSIS — I38 Endocarditis, valve unspecified: Secondary | ICD-10-CM

## 2016-08-22 DIAGNOSIS — I4821 Permanent atrial fibrillation: Secondary | ICD-10-CM

## 2016-08-22 DIAGNOSIS — I482 Chronic atrial fibrillation: Secondary | ICD-10-CM | POA: Diagnosis not present

## 2016-08-22 DIAGNOSIS — I442 Atrioventricular block, complete: Secondary | ICD-10-CM

## 2016-08-22 NOTE — Patient Instructions (Addendum)
Medication Instructions:   Your physician recommends that you continue on your current medications as directed. Please refer to the Current Medication list given to you today.  Labwork:  NONE  Testing/Procedures:  NONE  Follow-Up: Your physician recommends that you schedule a follow-up appointment in: 1 year. Please schedule this appointment today before leaving the office.   Any Other Special Instructions Will Be Listed Below (If Applicable).  Your next device check from home is on 11/24/16.  If you need a refill on your cardiac medications before your next appointment, please call your pharmacy.

## 2016-08-22 NOTE — Progress Notes (Signed)
PCP: [name], MD Primary Cardiologist:  Dr Belenda Cruise [name] is a 83 y.o. female who presents today for routine electrophysiology followup.  Since last being seen in our clinic, the patient reports doing reasonably well.  SOB is at baseline.  She denies new concerns today.  Today, she denies symptoms of palpitations, chest pain,  lower extremity edema, dizziness, presyncope, or syncope.  The patient is otherwise without complaint today.   Past Medical History:  Diagnosis Date  . Chronic lung disease    on home O2   . Complete heart block (HCC)    s/p PPM by Dr Rayann Heman  . Debilitated   . Diastolic dysfunction   . Hypertension   . Lymphedema of right lower extremity   . MI (mitral incompetence)   . Obesity   . Permanent atrial fibrillation ([place])   . Pulmonary hypertension ([place])   . Rheumatoid arthritis(714.0)   . Sleep apnea   . Stroke [facility])    remote  . Venous insufficiency    with chronic leg ulcers   Past Surgical History:  Procedure Laterality Date  . PACEMAKER INSERTION  08/13/10   SJM by Dr Rayann Heman    ROS- all systems are reviewed and negative except as per HPI above  Current Outpatient Prescriptions  Medication Sig Dispense Refill  . ciprofloxacin (CIPRO) 500 MG tablet Take 500 mg by mouth 2 (two) times daily.    . clindamycin (CLEOCIN) 300 MG capsule Take 300 mg by mouth 4 (four) times daily.    Marland Kitchen donepezil (ARICEPT) 10 MG tablet TAKE ONE (1) TABLET AT BEDTIME 90 tablet 1  . ELIQUIS 5 MG TABS tablet TAKE ONE TABLET BY MOUTH TWICE DAILY 180 tablet 0  . fluticasone furoate-vilanterol (BREO ELLIPTA) 200-25 MCG/INH AEPB Inhale 1 puff into the lungs daily. 1 each 11  . furosemide (LASIX) 40 MG tablet TAKE 1 TABLET EVERY MORNING AND TAKE 1/2 TABLET AT LUNCH 135 tablet 1  . hydrocortisone (PROCTOZONE-HC) 2.5 % rectal cream Place 1 application rectally 2 (two) times daily. 30 g 0  . levothyroxine (SYNTHROID, LEVOTHROID) 50 MCG tablet TAKE ONE (1) TABLET EACH DAY  90 tablet 2  . memantine (NAMENDA) 10 MG tablet TAKE ONE TABLET BY MOUTH TWICE DAILY 180 tablet 1  . Multiple Vitamins-Minerals (MULTIVITAMIN WITH MINERALS) tablet Take 1 tablet by mouth daily.    . OXYGEN-HELIUM IN Inhale 4 L into the lungs continuous. 4L    . simvastatin (ZOCOR) 20 MG tablet TAKE ONE (1) TABLET EACH DAY 90 tablet 0  . traMADol (ULTRAM) 50 MG tablet Take 1 tablet (50 mg total) by mouth 3 (three) times daily as needed for moderate pain. 60 tablet 3   No current facility-administered medications for this visit.     Physical Exam: Vitals:   08/22/16 1046  BP: 120/74  Pulse: 74  SpO2: 98%  Weight: 144 lb (65.3 kg)  Height: 5\' 4"  (1.626 m)    GEN- The patient is elderly appearing, alert and oriented x 3 today.   Head- normocephalic, atraumatic Eyes-  Sclera clear, conjunctiva pink Ears- hearing intact Oropharynx- clear Lungs- Clear to ausculation bilaterally, normal work of breathing Chest- pacemaker pocket is well healed Heart- Regular rate and rhythm (paced) GI- soft, NT, ND, + BS Extremities- no clubbing, cyanosis, or edema  Pacemaker interrogation- reviewed in detail today,  See PACEART report  Assessment and Plan:  1. Complete heart block Normal pacemaker function See Pace Art report No changes today  2.  Permanent afib chads2vasc score is 5 Continue anticoagulation  3. Valvular heart disease Followed by Dr Harl Bowie Conservative management  Merlin Return to see Dr Harl Bowie as scheduled I will see again in 1 year  Thompson Grayer MD, [facility] 08/22/2016 11:46 AM

## 2016-08-23 DIAGNOSIS — J449 Chronic obstructive pulmonary disease, unspecified: Secondary | ICD-10-CM | POA: Diagnosis not present

## 2016-08-26 ENCOUNTER — Other Ambulatory Visit: Payer: Self-pay | Admitting: Family Medicine

## 2016-08-26 DIAGNOSIS — L928 Other granulomatous disorders of the skin and subcutaneous tissue: Secondary | ICD-10-CM | POA: Diagnosis not present

## 2016-08-26 DIAGNOSIS — I83018 Varicose veins of right lower extremity with ulcer other part of lower leg: Secondary | ICD-10-CM | POA: Diagnosis not present

## 2016-09-08 DIAGNOSIS — L97811 Non-pressure chronic ulcer of other part of right lower leg limited to breakdown of skin: Secondary | ICD-10-CM | POA: Diagnosis not present

## 2016-09-11 ENCOUNTER — Other Ambulatory Visit: Payer: Self-pay | Admitting: Family Medicine

## 2016-09-11 ENCOUNTER — Other Ambulatory Visit: Payer: Self-pay

## 2016-09-11 MED ORDER — APIXABAN 5 MG PO TABS: 5.0000 mg | ORAL_TABLET | Freq: Two times a day (BID) | ORAL | 0 refills | Status: DC

## 2016-09-23 DIAGNOSIS — I83018 Varicose veins of right lower extremity with ulcer other part of lower leg: Secondary | ICD-10-CM | POA: Diagnosis not present

## 2016-09-23 DIAGNOSIS — I872 Venous insufficiency (chronic) (peripheral): Secondary | ICD-10-CM | POA: Diagnosis not present

## 2016-09-23 DIAGNOSIS — L928 Other granulomatous disorders of the skin and subcutaneous tissue: Secondary | ICD-10-CM | POA: Diagnosis not present

## 2016-09-23 DIAGNOSIS — J449 Chronic obstructive pulmonary disease, unspecified: Secondary | ICD-10-CM | POA: Diagnosis not present

## 2016-10-14 ENCOUNTER — Encounter: Payer: Self-pay | Admitting: Cardiology

## 2016-10-14 ENCOUNTER — Telehealth: Payer: Self-pay | Admitting: Cardiology

## 2016-10-14 ENCOUNTER — Ambulatory Visit (INDEPENDENT_AMBULATORY_CARE_PROVIDER_SITE_OTHER): Payer: PPO | Admitting: Cardiology

## 2016-10-14 ENCOUNTER — Telehealth: Payer: Self-pay | Admitting: *Deleted

## 2016-10-14 VITALS — BP 148/78 | HR 64 | Ht 59.0 in | Wt 139.0 lb

## 2016-10-14 DIAGNOSIS — I442 Atrioventricular block, complete: Secondary | ICD-10-CM | POA: Diagnosis not present

## 2016-10-14 DIAGNOSIS — I38 Endocarditis, valve unspecified: Secondary | ICD-10-CM | POA: Diagnosis not present

## 2016-10-14 DIAGNOSIS — I4891 Unspecified atrial fibrillation: Secondary | ICD-10-CM

## 2016-10-14 DIAGNOSIS — I340 Nonrheumatic mitral (valve) insufficiency: Secondary | ICD-10-CM | POA: Diagnosis not present

## 2016-10-14 MED ORDER — RIVAROXABAN 15 MG PO TABS: 15.0000 mg | ORAL_TABLET | Freq: Every day | ORAL | 6 refills | Status: DC

## 2016-10-14 MED ORDER — APIXABAN 5 MG PO TABS: 5.0000 mg | ORAL_TABLET | Freq: Two times a day (BID) | ORAL | 0 refills | Status: DC

## 2016-10-14 NOTE — Progress Notes (Signed)
Clinical Summary Ms. Sypher is a 83 y.o.female seen today for follow up of the following medical problems.   1. Heart block - pacemaker followed by Dr Rayann Heman  - normal device check 07/2016. No recent symptoms.    2. PAF - no recent palpitations - eliquis is costing $100 a month, asking for alternative. No recent bleeding troubles.   3. Valvular heart disease - no recent symptoms.   4. Leg swelling - compliant with compression stockings and diuretics - leg wounds have healed since last visit, has been followed by vascular and wound care .    Past Medical History:  Diagnosis Date  . Chronic lung disease    on home O2   . Complete heart block (HCC)    s/p PPM by Dr Rayann Heman  . Debilitated   . Diastolic dysfunction   . Hypertension   . Lymphedema of right lower extremity   . MI (mitral incompetence)   . Obesity   . Permanent atrial fibrillation ([employer])   . Pulmonary hypertension ([place])   . Rheumatoid arthritis(714.0)   . Sleep apnea   . Stroke [facility])    remote  . Venous insufficiency    with chronic leg ulcers     No Known Allergies   Current Outpatient Prescriptions  Medication Sig Dispense Refill  . apixaban (ELIQUIS) 5 MG TABS tablet Take 1 tablet (5 mg total) by mouth 2 (two) times daily. 56 tablet 0  . ciprofloxacin (CIPRO) 500 MG tablet Take 500 mg by mouth 2 (two) times daily.    . clindamycin (CLEOCIN) 300 MG capsule Take 300 mg by mouth 4 (four) times daily.    Marland Kitchen donepezil (ARICEPT) 10 MG tablet TAKE ONE TABLET DAILY AT BEDTIME 90 tablet 1  . fluticasone furoate-vilanterol (BREO ELLIPTA) 200-25 MCG/INH AEPB Inhale 1 puff into the lungs daily. 1 each 11  . furosemide (LASIX) 40 MG tablet TAKE 1 TABLET EVERY MORNING AND TAKE 1/2 TABLET AT LUNCH 135 tablet 1  . hydrocortisone (PROCTOZONE-HC) 2.5 % rectal cream Place 1 application rectally 2 (two) times daily. 30 g 0  . levothyroxine (SYNTHROID, LEVOTHROID) 50 MCG tablet TAKE ONE (1) TABLET EACH DAY  90 tablet 2  . memantine (NAMENDA) 10 MG tablet TAKE ONE TABLET BY MOUTH TWICE DAILY 180 tablet 1  . Multiple Vitamins-Minerals (MULTIVITAMIN WITH MINERALS) tablet Take 1 tablet by mouth daily.    . OXYGEN-HELIUM IN Inhale 4 L into the lungs continuous. 4L    . simvastatin (ZOCOR) 20 MG tablet TAKE ONE (1) TABLET EACH DAY 90 tablet 0  . traMADol (ULTRAM) 50 MG tablet Take 1 tablet (50 mg total) by mouth 3 (three) times daily as needed for moderate pain. 60 tablet 3   No current facility-administered medications for this visit.      Past Surgical History:  Procedure Laterality Date  . PACEMAKER INSERTION  08/13/10   SJM by Dr Rayann Heman     No Known Allergies    Family History  Problem Relation Age of Onset  . Colon cancer Mother   . Alzheimer's disease Mother   . Congestive Heart Failure Father      Social History Ms. Vollrath reports that she has never smoked. She has never used smokeless tobacco. Ms. Dils reports that she does not drink alcohol.   Review of Systems CONSTITUTIONAL: No weight loss, fever, chills, weakness or fatigue.  HEENT: Eyes: No visual loss, blurred vision, double vision or yellow sclerae.No hearing loss, sneezing, congestion,  runny nose or sore throat.  SKIN: No rash or itching.  CARDIOVASCULAR: per hpi RESPIRATORY: No shortness of breath, cough or sputum.  GASTROINTESTINAL: No anorexia, nausea, vomiting or diarrhea. No abdominal pain or blood.  GENITOURINARY: No burning on urination, no polyuria NEUROLOGICAL: No headache, dizziness, syncope, paralysis, ataxia, numbness or tingling in the extremities. No change in bowel or bladder control.  MUSCULOSKELETAL: No muscle, back pain, joint pain or stiffness.  LYMPHATICS: No enlarged nodes. No history of splenectomy.  PSYCHIATRIC: No history of depression or anxiety.  ENDOCRINOLOGIC: No reports of sweating, cold or heat intolerance. No polyuria or polydipsia.  Marland Kitchen   Physical Examination Vitals:    10/14/16 1136  BP: (!) 148/78  Pulse: 64  SpO2: 98%   Vitals:   10/14/16 1136  Weight: 139 lb (63 kg)  Height: 4\' 11"  (1.499 m)    Gen: resting comfortably, no acute distress HEENT: no scleral icterus, pupils equal round and reactive, no palptable cervical adenopathy,  CV: irreg, 2/6 systolic murmur at apex, no jvd Resp: Clear to auscultation bilaterally GI: abdomen is soft, non-tender, non-distended, normal bowel sounds, no hepatosplenomegaly MSK: extremities are warm, no edema.  Skin: warm, no rash Neuro:  no focal deficits Psych: appropriate affect   Diagnostic Studies 04/2014 Echo Study Conclusions  - Left ventricle: The cavity size was normal. Wall thickness was normal. Systolic function was normal. The estimated ejection fraction was in the range of 55% to 60%. Abnormal diastolic function, indeterminate grade. Wall motion was normal; there were no regional wall motion abnormalities. - Aortic valve: Mildly calcified annulus. Trileaflet; mildly thickened leaflets. There was mild stenosis. There was mild to moderate regurgitation. Mean gradient (S): 13 mm Hg. Valve area (VTI): 1.56 cm^2. Valve area (Vmax): 1.45 cm^2. Valve area (Vmean): 1.34 cm^2. Regurgitation pressure half-time: 542 ms. - Mitral valve: Mildly to moderately calcified annulus. Mildly thickened leaflets . There was moderate regurgitation. The MR VC is 0.5 cm. - Left atrium: The atrium was severely dilated. - Right ventricle: The cavity size was mildly dilated. - Right atrium: The atrium was severely dilated. - Tricuspid valve: There are 2 seperate TR jets. TR VC for jet 1 is 4.0 cm, the TR VC for jet 2 is 0.3 cm. The composite of the jets is moderate to severe TR. - Pulmonary arteries: Systolic pressure was moderately increased. PA peak pressure: 57 mm Hg (S). - Inferior vena cava: The vessel was normal in size. The respirophasic diameter changes were in the normal range  (>= 50%), consistent with normal central venous pressure. - Technically adequate study.      Assessment and Plan  1. Heart block - pacemaker management per Dr Rayann Heman - normal device check recently, asymptomatic. Continue to monitor.   2. Valvular heart disease - moderate MR and TR - no recent symptoms. Repeat echo for surveillance  3. LE edema - continue compressoin stockings and diuretics.   4. Afib - CHADS2vasc score is 3, continue eliquis. We will check to see if xarelto 15mg  daily is cheaper for her   5. PAD - followed by vascular   F/u 6 months   Arnoldo Lenis, M.D.

## 2016-10-14 NOTE — Telephone Encounter (Signed)
Drug store- [place] says Xarelto 15mg  would be $45/monthly - spoke with pt who would like to make this change - she will finish Eliquis samples that we gave her at today's office visit and then pick up Xarelto at pharmacy - updated medication list and new rx sent to pharmacy

## 2016-10-14 NOTE — Patient Instructions (Signed)
Your physician wants you to follow-up in: 6 MONTHS WITH DR. BRANCH You will receive a reminder letter in the mail two months in advance. If you don't receive a letter, please call our office to schedule the follow-up appointment.  Your physician recommends that you continue on your current medications as directed. Please refer to the Current Medication list given to you today.  Your physician has requested that you have an echocardiogram. Echocardiography is a painless test that uses sound waves to create images of your heart. It provides your doctor with information about the size and shape of your heart and how well your heart's chambers and valves are working. This procedure takes approximately one hour. There are no restrictions for this procedure.  Thank you for choosing  [facility]!!   

## 2016-10-14 NOTE — Telephone Encounter (Signed)
Pre-cert Verification for the following procedure   Echo scheduled 10-15-16

## 2016-10-15 ENCOUNTER — Ambulatory Visit (INDEPENDENT_AMBULATORY_CARE_PROVIDER_SITE_OTHER): Payer: PPO

## 2016-10-15 ENCOUNTER — Other Ambulatory Visit: Payer: Self-pay

## 2016-10-15 DIAGNOSIS — I340 Nonrheumatic mitral (valve) insufficiency: Secondary | ICD-10-CM

## 2016-10-20 ENCOUNTER — Ambulatory Visit (INDEPENDENT_AMBULATORY_CARE_PROVIDER_SITE_OTHER): Payer: PPO | Admitting: Family Medicine

## 2016-10-20 ENCOUNTER — Encounter: Payer: Self-pay | Admitting: Family Medicine

## 2016-10-20 VITALS — BP 139/71 | HR 73 | Temp 98.0°F | Ht 59.0 in | Wt 141.0 lb

## 2016-10-20 DIAGNOSIS — I482 Chronic atrial fibrillation: Secondary | ICD-10-CM

## 2016-10-20 DIAGNOSIS — I4821 Permanent atrial fibrillation: Secondary | ICD-10-CM

## 2016-10-20 DIAGNOSIS — I519 Heart disease, unspecified: Secondary | ICD-10-CM

## 2016-10-20 DIAGNOSIS — E785 Hyperlipidemia, unspecified: Secondary | ICD-10-CM | POA: Diagnosis not present

## 2016-10-20 DIAGNOSIS — E039 Hypothyroidism, unspecified: Secondary | ICD-10-CM

## 2016-10-20 DIAGNOSIS — G301 Alzheimer's disease with late onset: Secondary | ICD-10-CM | POA: Diagnosis not present

## 2016-10-20 DIAGNOSIS — I10 Essential (primary) hypertension: Secondary | ICD-10-CM | POA: Diagnosis not present

## 2016-10-20 DIAGNOSIS — F0280 Dementia in other diseases classified elsewhere without behavioral disturbance: Secondary | ICD-10-CM

## 2016-10-20 DIAGNOSIS — Z950 Presence of cardiac pacemaker: Secondary | ICD-10-CM

## 2016-10-20 DIAGNOSIS — I442 Atrioventricular block, complete: Secondary | ICD-10-CM

## 2016-10-20 DIAGNOSIS — I5189 Other ill-defined heart diseases: Secondary | ICD-10-CM

## 2016-10-20 MED ORDER — SIMVASTATIN 20 MG PO TABS: ORAL_TABLET | ORAL | 1 refills | Status: DC

## 2016-10-20 MED ORDER — LEVOTHYROXINE SODIUM 50 MCG PO TABS: ORAL_TABLET | ORAL | 2 refills | Status: DC

## 2016-10-20 MED ORDER — LINACLOTIDE 72 MCG PO CAPS: 72.0000 ug | ORAL_CAPSULE | Freq: Every day | ORAL | 2 refills | Status: DC

## 2016-10-20 MED ORDER — MEMANTINE HCL 10 MG PO TABS: 10.0000 mg | ORAL_TABLET | Freq: Two times a day (BID) | ORAL | 1 refills | Status: DC

## 2016-10-20 NOTE — Progress Notes (Signed)
Subjective:  Patient ID: [name], female    DOB: 1933/12/26  Age: 83 y.o. MRN: [id number]  CC: Follow-up (pt here today for routine follow up and blood work. She has been released from wound care as both legs have completely healed and doing well.)   HPI [name] presents for Wearing compression stockings to prevent recurrence of the lesions mentioned above. Had echocardiogram last week for her diastolic dysfunction and report is pending. However she's had no shortness of breath and edema is well-controlled with the stockings. She is also elevating as much as possible. Much of the history is taken from the daughter [name] as [name] has been diagnosed with dementia. Today she answers questions simply particularly with relation to constipation. She does not have normal bowel movements. Considering use of Miralax  Depression screen [facility] 2/9 10/20/2016 07/18/2016 05/16/2016  Decreased Interest 0 0 0  Down, Depressed, Hopeless 0 0 0  PHQ - 2 Score 0 0 0    History [name] has a past medical history of Chronic lung disease; Complete heart block ([place]); Debilitated; Diastolic dysfunction; Hypertension; Lymphedema of right lower extremity; MI (mitral incompetence); Obesity; Permanent atrial fibrillation ([place]); Pulmonary hypertension ([place]); Rheumatoid arthritis(714.0); Sleep apnea; Stroke [facility]); and Venous insufficiency.   She has a past surgical history that includes Pacemaker insertion (08/13/10).   Her family history includes Alzheimer's disease in her mother; Colon cancer in her mother; Congestive Heart Failure in her father.She reports that she has never smoked. She has never used smokeless tobacco. She reports that she does not drink alcohol or use drugs.    ROS Review of Systems  Constitutional: Negative for fever.  HENT: Negative for congestion, rhinorrhea and sore throat.   Respiratory: Negative for cough and shortness of breath.   Cardiovascular: Negative  for chest pain and palpitations.  Gastrointestinal: Positive for abdominal distention and constipation. Negative for abdominal pain.  Musculoskeletal: Negative for arthralgias and myalgias.    Objective:  BP 139/71   Pulse 73   Temp 98 F (36.7 C) (Oral)   Ht 4\' 11"  (1.499 m)   Wt 141 lb (64 kg)   BMI 28.48 kg/m   BP Readings from Last 3 Encounters:  10/20/16 139/71  10/14/16 (!) 148/78  08/22/16 120/74    Wt Readings from Last 3 Encounters:  10/20/16 141 lb (64 kg)  10/14/16 139 lb (63 kg)  08/22/16 144 lb (65.3 kg)     Physical Exam  Constitutional: She is oriented to person, place, and time. She appears well-developed and well-nourished. No distress.  HENT:  Head: Normocephalic and atraumatic.  Eyes: Pupils are equal, round, and reactive to light. Conjunctivae are normal.  Neck: Normal range of motion. Neck supple. No thyromegaly present.  Cardiovascular: Normal rate, regular rhythm and normal heart sounds.   No murmur heard. Pulmonary/Chest: Effort normal and breath sounds normal. No respiratory distress. She has no wheezes. She has no rales.  Abdominal: Soft. Bowel sounds are normal. She exhibits no distension. There is no tenderness.  Musculoskeletal:  Ambulating slowly with a walker.  Lymphadenopathy:    She has no cervical adenopathy.  Neurological: She is alert and oriented to person, place, and time.  Skin: Skin is warm and dry.  Psychiatric: She has a normal mood and affect. Her behavior is normal.      Assessment & Plan:   [name] was seen today for follow-up.  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 To regulate  bowel movements       I am having Ms. [name] start on linaclotide. I am also having her maintain her OXYGEN-HELIUM IN, multivitamin with minerals, hydrocortisone, memantine, traMADol, levothyroxine, simvastatin, fluticasone furoate-vilanterol, furosemide, donepezil, and Rivaroxaban.  Allergies as of 10/20/2016   No Known Allergies     Medication List       Accurate as of 10/20/16 11:08 AM. Always use your most recent med list.          donepezil 10 MG tablet Commonly known as:  ARICEPT TAKE ONE TABLET DAILY AT BEDTIME   fluticasone furoate-vilanterol 200-25 MCG/INH Aepb Commonly known as:  BREO ELLIPTA Inhale 1 puff into the lungs daily.   furosemide 40 MG tablet Commonly known as:  LASIX TAKE 1 TABLET EVERY MORNING AND TAKE 1/2 TABLET AT LUNCH   hydrocortisone 2.5 % rectal cream Commonly known as:  PROCTOZONE-HC Place 1 application rectally 2 (two) times daily.   levothyroxine 50 MCG tablet Commonly known as:  SYNTHROID, LEVOTHROID TAKE ONE (1) TABLET EACH DAY   linaclotide 72 MCG capsule Commonly known as:  LINZESS Take 1 capsule (72 mcg total) by mouth daily. To regulate bowel movements   memantine 10 MG tablet Commonly known as:  NAMENDA TAKE ONE TABLET BY MOUTH TWICE DAILY   multivitamin with minerals tablet Take 1 tablet by mouth daily.   OXYGEN Inhale 4 L into the lungs continuous. 4L   Rivaroxaban 15 MG Tabs tablet Commonly known as:  XARELTO Take 1 tablet (15 mg total) by mouth daily with supper.   simvastatin 20 MG tablet Commonly known as:  ZOCOR TAKE ONE (1) TABLET EACH DAY   traMADol 50 MG tablet Commonly known as:  ULTRAM Take 1 tablet (50 mg total) by mouth 3 (three) times daily as needed for moderate pain.            Discharge Care Instructions        Start     Ordered   10/20/16 0000  linaclotide (LINZESS) 72 MCG capsule  Daily     10/20/16 1107       Follow-up: Return in about 3 months (around  01/19/2017).  [name], M.D.

## 2016-10-20 NOTE — Addendum Note (Signed)
Addended by: Marylin Crosby on: 10/20/2016 11:13 AM   Modules accepted: Orders

## 2016-10-21 LAB — TSH: TSH: 2.4 u[IU]/mL (ref 0.450–4.500)

## 2016-10-21 LAB — CBC WITH DIFFERENTIAL/PLATELET
BASOS ABS: 0 10*3/uL (ref 0.0–0.2)
Basos: 0 %
EOS (ABSOLUTE): 0.1 10*3/uL (ref 0.0–0.4)
EOS: 1 %
Hematocrit: 37.8 % (ref 34.0–46.6)
Hemoglobin: 12.1 g/dL (ref 11.1–15.9)
IMMATURE GRANS (ABS): 0 10*3/uL (ref 0.0–0.1)
IMMATURE GRANULOCYTES: 0 %
LYMPHS: 18 %
Lymphocytes Absolute: 1.6 10*3/uL (ref 0.7–3.1)
MCH: 27.8 pg (ref 26.6–33.0)
MCHC: 32 g/dL (ref 31.5–35.7)
MCV: 87 fL (ref 79–97)
MONOCYTES: 5 %
Monocytes Absolute: 0.5 10*3/uL (ref 0.1–0.9)
NEUTROS PCT: 76 %
Neutrophils Absolute: 6.9 10*3/uL (ref 1.4–7.0)
PLATELETS: 219 10*3/uL (ref 150–379)
RBC: 4.36 x10E6/uL (ref 3.77–5.28)
RDW: 16.3 % — AB (ref 12.3–15.4)
WBC: 9.1 10*3/uL (ref 3.4–10.8)

## 2016-10-21 LAB — CMP14+EGFR
A/G RATIO: 1.5 (ref 1.2–2.2)
ALT: 12 IU/L (ref 0–32)
AST: 27 IU/L (ref 0–40)
Albumin: 4.3 g/dL (ref 3.5–4.7)
Alkaline Phosphatase: 76 IU/L (ref 39–117)
BUN/Creatinine Ratio: 18 (ref 12–28)
BUN: 24 mg/dL (ref 8–27)
Bilirubin Total: 0.3 mg/dL (ref 0.0–1.2)
CALCIUM: 9.5 mg/dL (ref 8.7–10.3)
CO2: 23 mmol/L (ref 20–29)
CREATININE: 1.31 mg/dL — AB (ref 0.57–1.00)
Chloride: 99 mmol/L (ref 96–106)
GFR, EST AFRICAN AMERICAN: 44 mL/min/{1.73_m2} — AB (ref >59)
GFR, EST NON AFRICAN AMERICAN: 38 mL/min/{1.73_m2} — AB (ref >59)
Globulin, Total: 2.9 g/dL (ref 1.5–4.5)
Glucose: 156 mg/dL — ABNORMAL HIGH (ref 65–99)
POTASSIUM: 4.6 mmol/L (ref 3.5–5.2)
Sodium: 145 mmol/L — ABNORMAL HIGH (ref 134–144)
TOTAL PROTEIN: 7.2 g/dL (ref 6.0–8.5)

## 2016-10-21 LAB — T4, FREE: Free T4: 1.26 ng/dL (ref 0.82–1.77)

## 2016-10-21 LAB — LIPID PANEL
CHOL/HDL RATIO: 2.8 ratio (ref 0.0–4.4)
Cholesterol, Total: 194 mg/dL (ref 100–199)
HDL: 69 mg/dL (ref >39)
LDL CALC: 103 mg/dL — AB (ref 0–99)
TRIGLYCERIDES: 109 mg/dL (ref 0–149)
VLDL CHOLESTEROL CAL: 22 mg/dL (ref 5–40)

## 2016-10-24 DIAGNOSIS — J449 Chronic obstructive pulmonary disease, unspecified: Secondary | ICD-10-CM | POA: Diagnosis not present

## 2016-10-31 ENCOUNTER — Telehealth: Payer: Self-pay | Admitting: *Deleted

## 2016-10-31 NOTE — Telephone Encounter (Signed)
Pt aware - 6 week f/u scheduled - routed to pcp

## 2016-10-31 NOTE — Telephone Encounter (Signed)
-----   Message from Arnoldo Lenis, MD sent at 10/21/2016 12:48 PM EDT ----- Fairly stable echo findings, though heart pumping function has decreased just slightly. Should f/u in 6 week to discus in detail and see if any med changes need to be made  Zandra Abts MD

## 2016-11-24 ENCOUNTER — Encounter: Payer: Self-pay | Admitting: Family Medicine

## 2016-11-24 ENCOUNTER — Ambulatory Visit (INDEPENDENT_AMBULATORY_CARE_PROVIDER_SITE_OTHER): Payer: PPO | Admitting: *Deleted

## 2016-11-24 DIAGNOSIS — I442 Atrioventricular block, complete: Secondary | ICD-10-CM | POA: Diagnosis not present

## 2016-11-25 NOTE — Progress Notes (Signed)
Remote pacemaker transmission.   

## 2016-11-27 ENCOUNTER — Ambulatory Visit: Payer: Self-pay | Admitting: Family Medicine

## 2016-11-27 LAB — CUP PACEART REMOTE DEVICE CHECK
Battery Remaining Longevity: 133 mo
Battery Remaining Percentage: 95.5 %
Brady Statistic RV Percent Paced: 42 %
Date Time Interrogation Session: 20181029063154
Implantable Lead Model: 1948
Lead Channel Impedance Value: 630 Ohm
Lead Channel Pacing Threshold Amplitude: 0.75 V
Lead Channel Setting Pacing Amplitude: 2.5 V
MDC IDC LEAD IMPLANT DT: 20120717
MDC IDC LEAD LOCATION: 753860
MDC IDC MSMT BATTERY VOLTAGE: 2.96 V
MDC IDC MSMT LEADCHNL RV PACING THRESHOLD PULSEWIDTH: 0.4 ms
MDC IDC MSMT LEADCHNL RV SENSING INTR AMPL: 6.1 mV
MDC IDC PG IMPLANT DT: 20120717
MDC IDC PG SERIAL: 7245638
MDC IDC SET LEADCHNL RV PACING PULSEWIDTH: 0.4 ms
MDC IDC SET LEADCHNL RV SENSING SENSITIVITY: 2.5 mV
Pulse Gen Model: 1210

## 2016-12-02 ENCOUNTER — Encounter: Payer: Self-pay | Admitting: Cardiology

## 2016-12-02 ENCOUNTER — Ambulatory Visit (INDEPENDENT_AMBULATORY_CARE_PROVIDER_SITE_OTHER): Payer: PPO | Admitting: Family Medicine

## 2016-12-02 ENCOUNTER — Encounter: Payer: Self-pay | Admitting: Family Medicine

## 2016-12-02 VITALS — BP 156/81 | HR 75 | Ht 59.0 in | Wt 142.0 lb

## 2016-12-02 DIAGNOSIS — F0281 Dementia in other diseases classified elsewhere with behavioral disturbance: Secondary | ICD-10-CM

## 2016-12-02 DIAGNOSIS — G301 Alzheimer's disease with late onset: Secondary | ICD-10-CM

## 2016-12-02 MED ORDER — QUETIAPINE FUMARATE 25 MG PO TABS: 25.0000 mg | ORAL_TABLET | Freq: Every day | ORAL | 0 refills | Status: DC

## 2016-12-03 ENCOUNTER — Encounter: Payer: Self-pay | Admitting: Family Medicine

## 2016-12-03 NOTE — Progress Notes (Signed)
Subjective:  Patient ID: [name], female    DOB: 19-Mar-1933  Age: 83 y.o. MRN: [id number]  CC: Memory Loss (pt here today with daughter who states pt is having increased memory loss and also has locked herself in the bathroom because [name] thought someone was after her.)   HPI [name] presents for the patient is brought in by her daughter who gives most of the history today.  [name] has been noted to be more aggressive in the home.  [name] is becoming rather paranoid.  [name] barricaded herself in her bedroom several days ago.  The daughter sent me a message via my chart which is attached and was reviewed by me today.  Since that text and bodies the concern today it has been copied below:  [name] is becoming paranoid, thinking someone is out to get her. [name] has barricaded herself with furniture in her bedroom twice today with a loaf of bread, chicken salad, and her potty chair. [name] wants a phone in her room, but I'm afraid [name]'ll start calling 911 like [name] did. [name] fell on Friday, but wasn't hurt. [name] does have a bruise on her elbow and one on her head. No one witnessed the fall and [name] told us [name] just "sat down" when getting off the bed. The bruise on her elbow and head didn't show up for a couple days, so we know [name] hit her head on something and her reason given for falling is probably not accurate. [name] is walking and talking the same, can move her arms and legs freely, speech is fine, eating and toileting fine. I had her read something today and [name] did so with no problem. This paranoid behavior is new. I know you had [name] on something for this when he was alive, and I'm wondering if [name] now needs something for paranoia also. [name] (daughter) [phone]   The daughter says [name] is not hostile but [name] is not always cooperative either.  Depression screen [facility] 2/9 12/02/2016 10/20/2016 07/18/2016  Decreased Interest 0 0 0  Down, Depressed, Hopeless 0 0 0  PHQ - 2 Score 0 0 0     History [name] has a past medical history of Chronic lung disease, Complete heart block (HCC), Debilitated, Diastolic dysfunction, Hypertension, Lymphedema of right lower extremity, MI (mitral incompetence), Obesity, Permanent atrial fibrillation ([place]), Pulmonary hypertension ([place]), Rheumatoid arthritis(714.0), Sleep apnea, Stroke ([place]), and Venous insufficiency.   [name] has a past surgical history that includes Pacemaker insertion (08/13/10).   Her family history includes Alzheimer's disease in her [name]; Colon cancer in her [name]; Congestive Heart Failure in her father.[name] reports that  has never smoked. [name] has never used smokeless tobacco. [name] reports that [name] does not drink alcohol or use drugs.    ROS Review of Systems  Constitutional: Positive for activity change. Negative for appetite change and fever.  HENT: Negative for congestion, rhinorrhea and sore throat.   Eyes: Negative for visual disturbance.  Respiratory: Negative for cough and shortness of breath.   Cardiovascular: Negative for chest pain and palpitations.  Gastrointestinal: Negative for abdominal pain, diarrhea and nausea.  Genitourinary: Negative for dysuria.  Musculoskeletal: Negative for arthralgias and myalgias.  Psychiatric/Behavioral: Positive for behavioral problems, confusion and dysphoric mood.    Objective:  BP (!) 156/81   Pulse 75   Ht 4\' 11"  (1.499 m)   Wt 142 lb (64.4 kg)   BMI 28.68 kg/m   BP Readings from Last 3 Encounters:  12/02/16 (!) 156/81  10/20/16 139/71  10/14/16 (!) 148/78    Wt Readings from Last 3 Encounters:  12/02/16 142 lb (64.4 kg)  10/20/16 141 lb (64 kg)  10/14/16 139 lb (63 kg)     Physical Exam  Constitutional: [name] is oriented to person, place, and time. [name] appears well-developed and well-nourished. No distress.  HENT:  Head: Normocephalic and atraumatic.  Eyes: Conjunctivae are normal. Pupils are equal, round, and reactive to light.  Neck: Normal range of  motion. Neck supple. No thyromegaly present.  Cardiovascular: Normal rate, regular rhythm and normal heart sounds.  No murmur heard. Pulmonary/Chest: Effort normal and breath sounds normal. No respiratory distress. [name] has no wheezes. [name] has no rales.  Abdominal: Soft. Bowel sounds are normal. [name] exhibits no distension. There is no tenderness.  Musculoskeletal: Normal range of motion.  Lymphadenopathy:    [name] has no cervical adenopathy.  Neurological: [name] is alert and oriented to person, place, and time.  Skin: Skin is warm and dry.  Psychiatric: Her affect is blunt. Her speech is delayed. [name] is slowed. Thought content is paranoid. Cognition and memory are impaired. [name] expresses inappropriate judgment. [name] exhibits abnormal recent memory and abnormal remote memory.      Assessment & Plan:   [name] was seen today for memory loss.  Diagnoses and all orders for this visit:  Late onset Alzheimer's disease with behavioral disturbance  Other orders -     QUEtiapine (SEROQUEL) 25 MG tablet; Take 1 tablet (25 mg total) at bedtime by mouth.   It appears that [name] is the entering and deeper stage of her Alzheimer's.  The paranoia frequently is accompanied by hostility.  The patient's daughter was counseled on care the patient.  Monitor for response to the Seroquel and follow-up in 4-6 weeks    I have discontinued [name]. [name]'s fluticasone furoate-vilanterol. I am also having her start on QUEtiapine. Additionally, I am having her maintain her OXYGEN-HELIUM IN, multivitamin with minerals, hydrocortisone, traMADol, furosemide, donepezil, Rivaroxaban, levothyroxine, linaclotide, memantine, and simvastatin.  Allergies as of 12/02/2016   No Known Allergies     Medication List        Accurate as of 12/02/16 11:59 PM. Always use your most recent med list.          donepezil 10 MG tablet Commonly known as:  ARICEPT TAKE ONE TABLET DAILY AT BEDTIME   furosemide 40 MG  tablet Commonly known as:  LASIX TAKE 1 TABLET EVERY MORNING AND TAKE 1/2 TABLET AT LUNCH   hydrocortisone 2.5 % rectal cream Commonly known as:  PROCTOZONE-HC Place 1 application rectally 2 (two) times daily.   levothyroxine 50 MCG tablet Commonly known as:  SYNTHROID, LEVOTHROID TAKE ONE (1) TABLET EACH DAY   linaclotide 72 MCG capsule Commonly known as:  LINZESS Take 1 capsule (72 mcg total) by mouth daily. To regulate bowel movements   memantine 10 MG tablet Commonly known as:  NAMENDA Take 1 tablet (10 mg total) by mouth 2 (two) times daily.   multivitamin with minerals tablet Take 1 tablet by mouth daily.   OXYGEN Inhale 4 L into the lungs continuous. 4L   QUEtiapine 25 MG tablet Commonly known as:  SEROQUEL Take 1 tablet (25 mg total) at bedtime by mouth.   Rivaroxaban 15 MG Tabs tablet Commonly known as:  XARELTO Take 1 tablet (15 mg total) by mouth daily with supper.   simvastatin 20 MG tablet Commonly known as:  ZOCOR TAKE ONE (1)  TABLET EACH DAY   traMADol 50 MG tablet Commonly known as:  ULTRAM Take 1 tablet (50 mg total) by mouth 3 (three) times daily as needed for moderate pain.        Follow-up: Return in about 1 month (around 01/01/2017).  [name], M.D.

## 2016-12-04 ENCOUNTER — Telehealth: Payer: Self-pay | Admitting: Cardiology

## 2016-12-04 NOTE — Telephone Encounter (Signed)
Cant afford medication Sheila Oats or Elliquis Wanting samples

## 2016-12-04 NOTE — Telephone Encounter (Signed)
Patient has appt on Monday will discuss with Dr. Harl Bowie then. Has enough medication until then

## 2016-12-08 ENCOUNTER — Other Ambulatory Visit: Payer: Self-pay

## 2016-12-08 ENCOUNTER — Ambulatory Visit: Payer: PPO | Admitting: Cardiology

## 2016-12-08 ENCOUNTER — Encounter: Payer: Self-pay | Admitting: Cardiology

## 2016-12-08 VITALS — BP 150/74 | HR 69 | Ht 59.0 in | Wt 143.2 lb

## 2016-12-08 DIAGNOSIS — I272 Pulmonary hypertension, unspecified: Secondary | ICD-10-CM | POA: Diagnosis not present

## 2016-12-08 DIAGNOSIS — I442 Atrioventricular block, complete: Secondary | ICD-10-CM | POA: Diagnosis not present

## 2016-12-08 DIAGNOSIS — I38 Endocarditis, valve unspecified: Secondary | ICD-10-CM

## 2016-12-08 DIAGNOSIS — I4891 Unspecified atrial fibrillation: Secondary | ICD-10-CM | POA: Diagnosis not present

## 2016-12-08 MED ORDER — METOPROLOL SUCCINATE ER 25 MG PO TB24: 12.5000 mg | ORAL_TABLET | Freq: Every day | ORAL | 1 refills | Status: DC

## 2016-12-08 MED ORDER — RIVAROXABAN 15 MG PO TABS: 15.0000 mg | ORAL_TABLET | Freq: Every day | ORAL | 0 refills | Status: DC

## 2016-12-08 NOTE — Patient Instructions (Signed)
Your physician recommends that you schedule a follow-up appointment in: [place] has recommended you make the following change in your medication:   START TOPROL XL 12.5 MG (1/2 TABLET) DAILY  Thank you for choosing [facility]!!

## 2016-12-08 NOTE — Progress Notes (Signed)
Clinical Summary [name] is a 83 y.o.female seen today for follow up of the following medical problems.   1. Heart block - pacemaker followed by Dr Rayann Heman  - normal device check 07/2016. No recent symptoms.    2. PAF - no recent palpitations - eliquis is costing $100 a month, asking for alternative. No recent bleeding troubles.   - difficultly affording eliquis or xarelto. She is currently on xarelto but expensive.    3. Valvular heart disease - no recent symptoms.  - 09/2016 echo mild to mod AI, mild AS, mod MR, mod TR.   4. Leg swelling - compliant with compression stockings and diuretics - leg wounds have healed since last visit, has been followed by vascular and wound care .  - edema currently controlled  5. Low normal to mildly decreased LV function - 09/2016 echo LVEF 45-50%. Limited visualization, could not eval diastolic function. Severe biatrail enlargement suggests significant diastolic dysfunction  - no recent symptoms  6. Pulmonary HTN - PASP 65 by last echo - suspected related to left sided heart disease with evidence of advanced diastolic dysfunction, as well as chronic lung disease with O2 depending COPD.   Past Medical History:  Diagnosis Date  . Chronic lung disease    on home O2   . Complete heart block (HCC)    s/p PPM by Dr Rayann Heman  . Debilitated   . Diastolic dysfunction   . Hypertension   . Lymphedema of right lower extremity   . MI (mitral incompetence)   . Obesity   . Permanent atrial fibrillation ([place])   . Pulmonary hypertension ([place])   . Rheumatoid arthritis(714.0)   . Sleep apnea   . Stroke [facility])    remote  . Venous insufficiency    with chronic leg ulcers     No Known Allergies   Current Outpatient Medications  Medication Sig Dispense Refill  . donepezil (ARICEPT) 10 MG tablet TAKE ONE TABLET DAILY AT BEDTIME 90 tablet 1  . furosemide (LASIX) 40 MG tablet TAKE 1 TABLET EVERY MORNING AND TAKE 1/2 TABLET AT  LUNCH 135 tablet 1  . hydrocortisone (PROCTOZONE-HC) 2.5 % rectal cream Place 1 application rectally 2 (two) times daily. 30 g 0  . levothyroxine (SYNTHROID, LEVOTHROID) 50 MCG tablet TAKE ONE (1) TABLET EACH DAY 90 tablet 2  . linaclotide (LINZESS) 72 MCG capsule Take 1 capsule (72 mcg total) by mouth daily. To regulate bowel movements 30 capsule 2  . memantine (NAMENDA) 10 MG tablet Take 1 tablet (10 mg total) by mouth 2 (two) times daily. 180 tablet 1  . Multiple Vitamins-Minerals (MULTIVITAMIN WITH MINERALS) tablet Take 1 tablet by mouth daily.    . OXYGEN-HELIUM IN Inhale 4 L into the lungs continuous. 4L    . QUEtiapine (SEROQUEL) 25 MG tablet Take 1 tablet (25 mg total) at bedtime by mouth. 30 tablet 0  . Rivaroxaban (XARELTO) 15 MG TABS tablet Take 1 tablet (15 mg total) by mouth daily with supper. 30 tablet 6  . simvastatin (ZOCOR) 20 MG tablet TAKE ONE (1) TABLET EACH DAY 90 tablet 1  . traMADol (ULTRAM) 50 MG tablet Take 1 tablet (50 mg total) by mouth 3 (three) times daily as needed for moderate pain. 60 tablet 3   No current facility-administered medications for this visit.      Past Surgical History:  Procedure Laterality Date  . PACEMAKER INSERTION  08/13/10   SJM by Dr Rayann Heman  No Known Allergies    Family History  Problem Relation Age of Onset  . Colon cancer Mother   . Alzheimer's disease Mother   . Congestive Heart Failure Father      Social History [name] reports that  has never smoked. she has never used smokeless tobacco. [name] reports that she does not drink alcohol.   Review of Systems CONSTITUTIONAL: No weight loss, fever, chills, weakness or fatigue.  HEENT: Eyes: No visual loss, blurred vision, double vision or yellow sclerae.No hearing loss, sneezing, congestion, runny nose or sore throat.  SKIN: No rash or itching.  CARDIOVASCULAR: per hpi RESPIRATORY: per hpi  GASTROINTESTINAL: No anorexia, nausea, vomiting or diarrhea. No  abdominal pain or blood.  GENITOURINARY: No burning on urination, no polyuria NEUROLOGICAL: No headache, dizziness, syncope, paralysis, ataxia, numbness or tingling in the extremities. No change in bowel or bladder control.  MUSCULOSKELETAL: No muscle, back pain, joint pain or stiffness.  LYMPHATICS: No enlarged nodes. No history of splenectomy.  PSYCHIATRIC: No history of depression or anxiety.  ENDOCRINOLOGIC: No reports of sweating, cold or heat intolerance. No polyuria or polydipsia.  Marland Kitchen   Physical Examination Vitals:   12/08/16 1534  BP: (!) 150/74  Pulse: 69  SpO2: 98%   Vitals:   12/08/16 1534  Weight: 143 lb 3.2 oz (65 kg)  Height: 4\' 11"  (1.499 m)    Gen: resting comfortably, no acute distress HEENT: no scleral icterus, pupils equal round and reactive, no palptable cervical adenopathy,  CV: RRR, no m/r/g, n ojvd Resp: Clear to auscultation bilaterally GI: abdomen is soft, non-tender, non-distended, normal bowel sounds, no hepatosplenomegaly MSK: extremities are warm, no edema.  Skin: warm, no rash Neuro:  no focal deficits Psych: appropriate affect   Diagnostic Studies 04/2014 Echo Study Conclusions  - Left ventricle: The cavity size was normal. Wall thickness was normal. Systolic function was normal. The estimated ejection fraction was in the range of 55% to 60%. Abnormal diastolic function, indeterminate grade. Wall motion was normal; there were no regional wall motion abnormalities. - Aortic valve: Mildly calcified annulus. Trileaflet; mildly thickened leaflets. There was mild stenosis. There was mild to moderate regurgitation. Mean gradient (S): 13 mm Hg. Valve area (VTI): 1.56 cm^2. Valve area (Vmax): 1.45 cm^2. Valve area (Vmean): 1.34 cm^2. Regurgitation pressure half-time: 542 ms. - Mitral valve: Mildly to moderately calcified annulus. Mildly thickened leaflets . There was moderate regurgitation. The MR VC is 0.5 cm. - Left atrium:  The atrium was severely dilated. - Right ventricle: The cavity size was mildly dilated. - Right atrium: The atrium was severely dilated. - Tricuspid valve: There are 2 seperate TR jets. TR VC for jet 1 is 4.0 cm, the TR VC for jet 2 is 0.3 cm. The composite of the jets is moderate to severe TR. - Pulmonary arteries: Systolic pressure was moderately increased. PA peak pressure: 57 mm Hg (S). - Inferior vena cava: The vessel was normal in size. The respirophasic diameter changes were in the normal range (>= 50%), consistent with normal central venous pressure. - Technically adequate study.   09/2016 echo Study Conclusions  - Left ventricle: The cavity size was normal. Wall thickness was   normal. Diastolic dysfunction, grade indeterminate. Systolic   function was mildly reduced. The estimated ejection fraction was   in the range of 45% to 50%. Diffuse hypokinesis. Doppler   parameters are consistent with high ventricular filling pressure. - Regional wall motion abnormality: Mild hypokinesis of the apical  myocardium. - Ventricular septum: Septal motion showed abnormal function and   dyssynergy. These changes are consistent with right ventricular   pacing. - Aortic valve: Moderately calcified annulus. Mildly thickened   leaflets. There was mild stenosis. There was mild to moderate   regurgitation. Peak velocity (S): 225 cm/s. Mean gradient (S): 11   mm Hg. - Mitral valve: Moderately to severely calcified annulus. Mildly   thickened leaflets . There was moderate eccentric regurgitation. - Left atrium: The atrium was severely dilated. - Right ventricle: The cavity size was mildly dilated. Pacer wire   or catheter noted in right ventricle. Systolic function was   mildly reduced. - Right atrium: The atrium was severely dilated. - Tricuspid valve: There was moderate regurgitation. - Pulmonic valve: There was mild regurgitation. - Pulmonary arteries: Systolic pressure was  moderately to severely   increased. PA peak pressure: 65 mm Hg (S).  Impressions:  - Systolic function was difficult to assess in all views, but   overall appeared mildly reduced, LVEF 45-50%. Consider a repeat   limited study with contrast for a more accurate assessment of   LVEF and regional wall motion.  Assessment and Plan  1. Heart block - pacemaker management per Dr Rayann Heman - normal device check recent, continue to monitor.   2. Valvular heart disease - moderate MR and TR - asymptomatic, we wiill conitnue to monitor.   3. LE edema - controlled, continue diuretics and compression stockings.   4. Afib - CHADS2vasc score is 3, continue anticoag   5. Pulm HTN - continue diuretics, due to both diastolic HF and COPD      Arnoldo Lenis, M.D.

## 2016-12-12 ENCOUNTER — Encounter: Payer: Self-pay | Admitting: Cardiology

## 2016-12-25 ENCOUNTER — Other Ambulatory Visit: Payer: Self-pay | Admitting: *Deleted

## 2016-12-25 MED ORDER — RIVAROXABAN 15 MG PO TABS: 15.0000 mg | ORAL_TABLET | Freq: Every day | ORAL | 0 refills | Status: AC

## 2017-01-22 ENCOUNTER — Ambulatory Visit: Payer: PPO | Admitting: Family Medicine

## 2017-02-06 ENCOUNTER — Telehealth: Payer: Self-pay | Admitting: Cardiology

## 2017-02-06 NOTE — Telephone Encounter (Signed)
Daughter called requesting advice for device making noises - tried to reach device clinic at [address] office but unable to speak with anyone - now after 5pm - advised daughter if pt had card with device company can call the company for further instruction - daughter says she did have this info with her and a phone number to call. Will forward to device triage for f/u on Monday

## 2017-02-09 NOTE — Telephone Encounter (Signed)
Spoke with pts daughter informed her that the home monitors could sometimes light up and make noises looking for connection assured her that the home monitor was working on our end and that if the home monitor does doe that from time to time it doesn't mean anything is wrong with the pt or her pacemaker. Reminded pts daughter of upcoming remote transmission on 02/23/17. She voiced understanding

## 2017-02-10 ENCOUNTER — Encounter: Payer: Self-pay | Admitting: Family Medicine

## 2017-02-10 ENCOUNTER — Ambulatory Visit (INDEPENDENT_AMBULATORY_CARE_PROVIDER_SITE_OTHER): Payer: PPO | Admitting: Family Medicine

## 2017-02-10 VITALS — BP 139/73 | HR 75 | Temp 97.2°F | Ht 59.0 in | Wt 143.0 lb

## 2017-02-10 DIAGNOSIS — I739 Peripheral vascular disease, unspecified: Secondary | ICD-10-CM | POA: Diagnosis not present

## 2017-02-10 DIAGNOSIS — E039 Hypothyroidism, unspecified: Secondary | ICD-10-CM | POA: Diagnosis not present

## 2017-02-10 DIAGNOSIS — Z23 Encounter for immunization: Secondary | ICD-10-CM

## 2017-02-10 DIAGNOSIS — E785 Hyperlipidemia, unspecified: Secondary | ICD-10-CM | POA: Diagnosis not present

## 2017-02-10 DIAGNOSIS — F0281 Dementia in other diseases classified elsewhere with behavioral disturbance: Secondary | ICD-10-CM | POA: Diagnosis not present

## 2017-02-10 DIAGNOSIS — G301 Alzheimer's disease with late onset: Secondary | ICD-10-CM

## 2017-02-10 DIAGNOSIS — I10 Essential (primary) hypertension: Secondary | ICD-10-CM | POA: Diagnosis not present

## 2017-02-10 DIAGNOSIS — I482 Chronic atrial fibrillation: Secondary | ICD-10-CM | POA: Diagnosis not present

## 2017-02-10 DIAGNOSIS — F02818 Dementia in other diseases classified elsewhere, unspecified severity, with other behavioral disturbance: Secondary | ICD-10-CM

## 2017-02-10 DIAGNOSIS — I4821 Permanent atrial fibrillation: Secondary | ICD-10-CM

## 2017-02-10 MED ORDER — DONEPEZIL HCL 10 MG PO TABS: 10.0000 mg | ORAL_TABLET | Freq: Every day | ORAL | 1 refills | Status: AC

## 2017-02-10 MED ORDER — FUROSEMIDE 40 MG PO TABS: ORAL_TABLET | ORAL | 1 refills | Status: DC

## 2017-02-10 MED ORDER — MEMANTINE HCL 10 MG PO TABS: 10.0000 mg | ORAL_TABLET | Freq: Two times a day (BID) | ORAL | 1 refills | Status: AC

## 2017-02-10 MED ORDER — QUETIAPINE FUMARATE 25 MG PO TABS: 25.0000 mg | ORAL_TABLET | Freq: Every day | ORAL | 1 refills | Status: DC

## 2017-02-10 MED ORDER — SIMVASTATIN 20 MG PO TABS: ORAL_TABLET | ORAL | 1 refills | Status: AC

## 2017-02-10 MED ORDER — LINACLOTIDE 72 MCG PO CAPS: 72.0000 ug | ORAL_CAPSULE | Freq: Every day | ORAL | 2 refills | Status: DC

## 2017-02-10 MED ORDER — LEVOTHYROXINE SODIUM 50 MCG PO TABS: ORAL_TABLET | ORAL | 2 refills | Status: DC

## 2017-02-10 NOTE — Progress Notes (Signed)
Subjective:  Patient ID: [name], female    DOB: 27-Sep-1933  Age: 84 y.o. MRN: [id number]  CC: Follow-up (pt here today for routine follow up of her chronic medical conditions)   HPI [name] presents for Patient in for follow-up of atrial fibrillation. Patient denies any recent bouts of chest pain or palpitations. Additionally, patient is taking anticoagulants. Patient denies any recent excessive bleeding episodes including epistaxis, bleeding from the gums, genitalia, rectal bleeding or hematuria. Additionally there has been no excessive bruising.  Patient continues to have poor circulation to the legs and has a few scabs and a great deal of thick scale.  However there are no open ulcers currently.  Patient's daughter is here, [name].  She gives most of the history today.  Patient is lucid and able to answer questions basically.  Daughter says however that she is very forgetful.  She cannot manage her own care.  She has been much calmer and has been not been seeing hallucinations since starting on the Seroquel.  She is sleeping better. Patient presents for follow-up on  thyroid. The patient has a history of hypothyroidism for many years. It has been stable recently. Pt. denies any change in  voice, loss of hair, heat or cold intolerance. Energy level has been adequate to good. Patient denies constipation and diarrhea. No myxedema. Medication is as noted below. Verified that pt is taking it daily on an empty stomach. Well tolerated.  Depression screen [facility] 2/9 12/02/2016 10/20/2016 07/18/2016  Decreased Interest 0 0 0  Down, Depressed, Hopeless 0 0 0  PHQ - 2 Score 0 0 0    History [name] has a past medical history of Chronic lung disease, Complete heart block (HCC), Debilitated, Diastolic dysfunction, Hypertension, Lymphedema of right lower extremity, MI (mitral incompetence), Obesity, Permanent atrial fibrillation ([place]), Pulmonary hypertension ([place]), Rheumatoid  arthritis(714.0), Sleep apnea, Stroke ([place]), and Venous insufficiency.   She has a past surgical history that includes Pacemaker insertion (08/13/10).   Her family history includes Alzheimer's disease in her mother; Colon cancer in her mother; Congestive Heart Failure in her father.She reports that  has never smoked. she has never used smokeless tobacco. She reports that she does not drink alcohol or use drugs.    ROS Review of Systems  Constitutional: Negative for activity change, appetite change and fever.  HENT: Negative for congestion, rhinorrhea and sore throat.   Eyes: Negative for visual disturbance.  Respiratory: Negative for cough and shortness of breath.   Cardiovascular: Negative for chest pain and palpitations.  Gastrointestinal: Negative for abdominal pain, diarrhea and nausea.  Genitourinary: Negative for dysuria.  Musculoskeletal: Negative for arthralgias and myalgias.    Objective:  BP 139/73   Pulse 75   Temp (!) 97.2 F (36.2 C) (Oral)   Ht _0  (1.499 m)   Wt 143 lb (64.9 kg)   BMI 28.88 kg/m   BP Readings from Last 3 Encounters:  02/10/17 139/73  12/08/16 (!) 150/74  12/02/16 (!) 156/81    Wt Readings from Last 3 Encounters:  02/10/17 143 lb (64.9 kg)  12/08/16 143 lb 3.2 oz (65 kg)  12/02/16 142 lb (64.4 kg)     Physical Exam  Constitutional: She appears well-developed and well-nourished. No distress.  HENT:  Head: Normocephalic and atraumatic.  Eyes: Conjunctivae are normal. Pupils are equal, round, and reactive to light.  Neck: Normal range of motion. Neck supple. No thyromegaly present.  Cardiovascular: Normal rate, regular rhythm and  normal heart sounds.  No murmur heard. Pulmonary/Chest: Effort normal and breath sounds normal. No respiratory distress. She has no wheezes. She has no rales.  Abdominal: Soft. Bowel sounds are normal. She exhibits no distension. There is no tenderness.  Musculoskeletal: Normal range of motion.    Lymphadenopathy:    She has no cervical adenopathy.  Neurological: She is alert. Coordination abnormal.  Skin: Skin is warm and dry.  There is marked atrophy of the skin from the upper third of the legs with violaceous changes all the way to the feet.  Thick yellow scale and a few crusted scabs bilaterally.  Pulses are diminished.  Psychiatric: She has a normal mood and affect. Her behavior is normal.      Assessment & Plan:   [name] was seen today for follow-up.  Diagnoses and all orders for this visit:  Essential hypertension -     CMP14+EGFR  Encounter for immunization -     Flu vaccine HIGH DOSE PF  Peripheral vascular disease of extremity ([place])  Late onset Alzheimer's disease with behavioral disturbance  Permanent atrial fibrillation (HCC)  Hypothyroidism, unspecified type  Hyperlipidemia, unspecified hyperlipidemia type  Other orders -     donepezil (ARICEPT) 10 MG tablet; Take 1 tablet (10 mg total) by mouth at bedtime. -     furosemide (LASIX) 40 MG tablet; TAKE 1 TABLET EVERY MORNING AND TAKE 1/2 TABLET AT LUNCH -     levothyroxine (SYNTHROID, LEVOTHROID) 50 MCG tablet; TAKE ONE (1) TABLET EACH DAY -     linaclotide (LINZESS) 72 MCG capsule; Take 1 capsule (72 mcg total) by mouth daily. To regulate bowel movements -     memantine (NAMENDA) 10 MG tablet; Take 1 tablet (10 mg total) by mouth 2 (two) times daily. -     QUEtiapine (SEROQUEL) 25 MG tablet; Take 1 tablet (25 mg total) by mouth at bedtime. -     simvastatin (ZOCOR) 20 MG tablet; TAKE ONE (1) TABLET EACH DAY       I have changed [name]'s donepezil and QUEtiapine. I am also having her maintain her OXYGEN-HELIUM IN, multivitamin with minerals, hydrocortisone, traMADol, metoprolol succinate, Rivaroxaban, furosemide, levothyroxine, linaclotide, memantine, and simvastatin.  Allergies as of 02/10/2017   No Known Allergies     Medication List        Accurate as of 02/10/17  5:47 PM. Always  use your most recent med list.          donepezil 10 MG tablet Commonly known as:  ARICEPT Take 1 tablet (10 mg total) by mouth at bedtime.   furosemide 40 MG tablet Commonly known as:  LASIX TAKE 1 TABLET EVERY MORNING AND TAKE 1/2 TABLET AT LUNCH   hydrocortisone 2.5 % rectal cream Commonly known as:  PROCTOZONE-HC Place 1 application rectally 2 (two) times daily.   levothyroxine 50 MCG tablet Commonly known as:  SYNTHROID, LEVOTHROID TAKE ONE (1) TABLET EACH DAY   linaclotide 72 MCG capsule Commonly known as:  LINZESS Take 1 capsule (72 mcg total) by mouth daily. To regulate bowel movements   memantine 10 MG tablet Commonly known as:  NAMENDA Take 1 tablet (10 mg total) by mouth 2 (two) times daily.   metoprolol succinate 25 MG 24 hr tablet Commonly known as:  TOPROL XL Take 0.5 tablets (12.5 mg total) daily by mouth.   multivitamin with minerals tablet Take 1 tablet by mouth daily.   OXYGEN Inhale 4 L into the lungs continuous. 4L  QUEtiapine 25 MG tablet Commonly known as:  SEROQUEL Take 1 tablet (25 mg total) by mouth at bedtime.   Rivaroxaban 15 MG Tabs tablet Commonly known as:  XARELTO Take 1 tablet (15 mg total) by mouth daily with supper.   simvastatin 20 MG tablet Commonly known as:  ZOCOR TAKE ONE (1) TABLET EACH DAY   traMADol 50 MG tablet Commonly known as:  ULTRAM Take 1 tablet (50 mg total) by mouth 3 (three) times daily as needed for moderate pain.        Follow-up: Return in about 3 months (around 05/11/2017).  [name], M.D.

## 2017-02-11 LAB — CMP14+EGFR
A/G RATIO: 1.6 (ref 1.2–2.2)
ALT: 11 IU/L (ref 0–32)
AST: 20 IU/L (ref 0–40)
Albumin: 4.2 g/dL (ref 3.5–4.7)
Alkaline Phosphatase: 74 IU/L (ref 39–117)
BUN / CREAT RATIO: 14 (ref 12–28)
BUN: 18 mg/dL (ref 8–27)
Bilirubin Total: 0.2 mg/dL (ref 0.0–1.2)
CALCIUM: 9.6 mg/dL (ref 8.7–10.3)
CO2: 28 mmol/L (ref 20–29)
CREATININE: 1.28 mg/dL — AB (ref 0.57–1.00)
Chloride: 101 mmol/L (ref 96–106)
GFR calc Af Amer: 45 mL/min/{1.73_m2} — ABNORMAL LOW (ref >59)
GFR, EST NON AFRICAN AMERICAN: 39 mL/min/{1.73_m2} — AB (ref >59)
GLOBULIN, TOTAL: 2.7 g/dL (ref 1.5–4.5)
Glucose: 98 mg/dL (ref 65–99)
POTASSIUM: 5 mmol/L (ref 3.5–5.2)
SODIUM: 145 mmol/L — AB (ref 134–144)
Total Protein: 6.9 g/dL (ref 6.0–8.5)

## 2017-02-23 ENCOUNTER — Ambulatory Visit (INDEPENDENT_AMBULATORY_CARE_PROVIDER_SITE_OTHER): Payer: PPO | Admitting: *Deleted

## 2017-02-23 DIAGNOSIS — I442 Atrioventricular block, complete: Secondary | ICD-10-CM

## 2017-02-23 NOTE — Progress Notes (Signed)
Remote pacemaker transmission.   

## 2017-02-26 ENCOUNTER — Encounter: Payer: Self-pay | Admitting: Cardiology

## 2017-02-26 LAB — CUP PACEART REMOTE DEVICE CHECK
Battery Remaining Longevity: 131 mo
Battery Remaining Percentage: 95.5 %
Battery Voltage: 2.96 V
Brady Statistic RV Percent Paced: 45 %
Date Time Interrogation Session: 20190128150103
Implantable Lead Location: 753860
Implantable Lead Model: 1948
Implantable Pulse Generator Implant Date: 20120717
Lead Channel Pacing Threshold Amplitude: 0.75 V
Lead Channel Pacing Threshold Pulse Width: 0.4 ms
Lead Channel Setting Pacing Amplitude: 2.5 V
Lead Channel Setting Sensing Sensitivity: 2.5 mV
MDC IDC LEAD IMPLANT DT: 20120717
MDC IDC MSMT LEADCHNL RV IMPEDANCE VALUE: 610 Ohm
MDC IDC MSMT LEADCHNL RV SENSING INTR AMPL: 8.1 mV
MDC IDC PG SERIAL: 7245638
MDC IDC SET LEADCHNL RV PACING PULSEWIDTH: 0.4 ms

## 2017-03-12 ENCOUNTER — Other Ambulatory Visit: Payer: Self-pay | Admitting: Cardiology

## 2017-03-14 ENCOUNTER — Emergency Department (HOSPITAL_COMMUNITY): Payer: PPO

## 2017-03-14 ENCOUNTER — Emergency Department (HOSPITAL_COMMUNITY)
Admission: EM | Admit: 2017-03-14 | Discharge: 2017-03-15 | Disposition: A | Discharge status: Home / Self Care | Payer: PPO | Attending: Emergency Medicine | Admitting: Emergency Medicine

## 2017-03-14 ENCOUNTER — Other Ambulatory Visit: Payer: Self-pay

## 2017-03-14 ENCOUNTER — Encounter (HOSPITAL_COMMUNITY): Payer: Self-pay | Admitting: *Deleted

## 2017-03-14 DIAGNOSIS — M7989 Other specified soft tissue disorders: Secondary | ICD-10-CM | POA: Diagnosis not present

## 2017-03-14 DIAGNOSIS — I2510 Atherosclerotic heart disease of native coronary artery without angina pectoris: Secondary | ICD-10-CM | POA: Diagnosis not present

## 2017-03-14 DIAGNOSIS — I10 Essential (primary) hypertension: Secondary | ICD-10-CM | POA: Insufficient documentation

## 2017-03-14 DIAGNOSIS — R2232 Localized swelling, mass and lump, left upper limb: Secondary | ICD-10-CM | POA: Diagnosis present

## 2017-03-14 DIAGNOSIS — M79645 Pain in left finger(s): Secondary | ICD-10-CM | POA: Diagnosis not present

## 2017-03-14 DIAGNOSIS — G309 Alzheimer's disease, unspecified: Secondary | ICD-10-CM | POA: Diagnosis not present

## 2017-03-14 DIAGNOSIS — Z950 Presence of cardiac pacemaker: Secondary | ICD-10-CM | POA: Insufficient documentation

## 2017-03-14 DIAGNOSIS — L03012 Cellulitis of left finger: Secondary | ICD-10-CM | POA: Diagnosis not present

## 2017-03-14 DIAGNOSIS — M19042 Primary osteoarthritis, left hand: Secondary | ICD-10-CM | POA: Diagnosis not present

## 2017-03-14 DIAGNOSIS — E039 Hypothyroidism, unspecified: Secondary | ICD-10-CM | POA: Insufficient documentation

## 2017-03-14 DIAGNOSIS — Z7901 Long term (current) use of anticoagulants: Secondary | ICD-10-CM | POA: Diagnosis not present

## 2017-03-14 DIAGNOSIS — Z79899 Other long term (current) drug therapy: Secondary | ICD-10-CM | POA: Diagnosis not present

## 2017-03-14 LAB — CBC WITH DIFFERENTIAL/PLATELET
BASOS PCT: 0 %
Basophils Absolute: 0 10*3/uL (ref 0.0–0.1)
EOS ABS: 0.1 10*3/uL (ref 0.0–0.7)
Eosinophils Relative: 1 %
HEMATOCRIT: 36 % (ref 36.0–46.0)
HEMOGLOBIN: 11.2 g/dL — AB (ref 12.0–15.0)
LYMPHS ABS: 2 10*3/uL (ref 0.7–4.0)
Lymphocytes Relative: 22 %
MCH: 29.6 pg (ref 26.0–34.0)
MCHC: 31.1 g/dL (ref 30.0–36.0)
MCV: 95.2 fL (ref 78.0–100.0)
MONO ABS: 1 10*3/uL (ref 0.1–1.0)
MONOS PCT: 11 %
NEUTROS ABS: 5.8 10*3/uL (ref 1.7–7.7)
Neutrophils Relative %: 66 %
Platelets: 252 10*3/uL (ref 150–400)
RBC: 3.78 MIL/uL — ABNORMAL LOW (ref 3.87–5.11)
RDW: 13.6 % (ref 11.5–15.5)
WBC: 9 10*3/uL (ref 4.0–10.5)

## 2017-03-14 NOTE — ED Notes (Signed)
Two rings removed by Ron, RN. Given to patient's daughter.

## 2017-03-14 NOTE — ED Notes (Signed)
Ron, RN in triage with ring cutter to remove 2 rings.

## 2017-03-14 NOTE — ED Provider Notes (Signed)
[facility] EMERGENCY DEPARTMENT Provider Note   CSN: [id number] Arrival date & time: 03/14/17  2048     History   Chief Complaint Chief Complaint  Patient presents with  . hand swelling    HPI [name] is a 84 y.o. female.  Patient presents to the emergency department for evaluation of pain and swelling of her left hand.  Symptoms began yesterday and have worsened today.  She reports redness, swelling and pain of the left ring finger.  She denies injury.  She does report that this area has swollen in the past but it has usually gone down on its own.  She has not noticed any fever.      Past Medical History:  Diagnosis Date  . Chronic lung disease    on home O2   . Complete heart block (HCC)    s/p PPM by Dr Rayann Heman  . Debilitated   . Diastolic dysfunction   . Hypertension   . Lymphedema of right lower extremity   . MI (mitral incompetence)   . Obesity   . Permanent atrial fibrillation ([place])   . Pulmonary hypertension ([place])   . Rheumatoid arthritis(714.0)   . Sleep apnea   . Stroke [facility])    remote  . Venous insufficiency    with chronic leg ulcers    Patient Active Problem List   Diagnosis Date Noted  . PVD (peripheral vascular disease) ([place]) 05/28/2016  . 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 Alzheimer's disease 10/11/2014  . Essential hypertension 10/11/2014  . Cardiomyopathy, dilated ([place]) 07/24/2014  . Apnea, sleep 07/24/2014  . Anemia 04/28/2014  . Chronic hypoxemic respiratory failure ([place]) 04/12/2014  . Chronic ulcer of leg ([place]) 04/12/2014  . Lymphedema of leg 04/12/2014  . Disorder of nutrition 04/12/2014  . Hypothyroidism 03/24/2014  . Stasis dermatitis of both legs 05/06/2013  . Pacemaker-St.[employer] 11/25/2011  . Complete heart block ([place]) 12/12/2010  . Permanent atrial fibrillation ([place]) 12/12/2010  . Diastolic dysfunction 37/10/6267  . Venous insufficiency 12/12/2010    . Long term current use of anticoagulant 09/30/2002    Past Surgical History:  Procedure Laterality Date  . PACEMAKER INSERTION  08/13/10   SJM by Dr Rayann Heman    OB History    No data available       Home Medications    Prior to Admission medications   Medication Sig Start Date End Date Taking? Authorizing Provider  donepezil (ARICEPT) 10 MG tablet Take 1 tablet (10 mg total) by mouth at bedtime. 02/10/17  Yes Stacks, Cletus Gash, MD  furosemide (LASIX) 40 MG tablet TAKE 1 TABLET EVERY MORNING AND TAKE 1/2 TABLET AT LUNCH 02/10/17  Yes Stacks, Cletus Gash, MD  hydrocortisone (PROCTOZONE-HC) 2.5 % rectal cream Place 1 application rectally 2 (two) times daily. 12/10/15  Yes Hassell Done, Mary-Margaret, FNP  levothyroxine (SYNTHROID, LEVOTHROID) 50 MCG tablet TAKE ONE (1) TABLET EACH DAY 02/10/17  Yes Stacks, Cletus Gash, MD  linaclotide [facility]) 72 MCG capsule Take 1 capsule (72 mcg total) by mouth daily. To regulate bowel movements 02/10/17  Yes Stacks, Cletus Gash, MD  memantine (NAMENDA) 10 MG tablet Take 1 tablet (10 mg total) by mouth 2 (two) times daily. 02/10/17  Yes [name], MD  metoprolol succinate (TOPROL-XL) 25 MG 24 hr tablet TAKE 1/2 TABLET DAILY 03/12/17  Yes Branch, Alphonse Guild, MD  QUEtiapine (SEROQUEL) 25 MG tablet Take 1 tablet (25 mg total) by mouth at bedtime. 02/10/17  Yes Stacks, Cletus Gash,  MD  Rivaroxaban (XARELTO) 15 MG TABS tablet Take 1 tablet (15 mg total) by mouth daily with supper. 12/25/16  Yes Arnoldo Lenis, MD  simvastatin (ZOCOR) 20 MG tablet TAKE ONE (1) TABLET EACH DAY 02/10/17  Yes [name], MD  traMADol (ULTRAM) 50 MG tablet Take 1 tablet (50 mg total) by mouth 3 (three) times daily as needed for moderate pain. 06/11/16  Yes StacksCletus Gash, MD  Multiple Vitamins-Minerals (MULTIVITAMIN WITH MINERALS) tablet Take 1 tablet by mouth daily.    [provider]  OXYGEN-HELIUM IN Inhale 4 L into the lungs continuous. 4L    [provider]    Family  History Family History  Problem Relation Age of Onset  . Colon cancer Mother   . Alzheimer's disease Mother   . Congestive Heart Failure Father     Social History Social History   Tobacco Use  . Smoking status: Never Smoker  . Smokeless tobacco: Never Used  Substance Use Topics  . Alcohol use: No    Alcohol/week: 0.0 oz  . Drug use: No     Allergies   Patient has no known allergies.   Review of Systems Review of Systems  Musculoskeletal: Positive for arthralgias.  Skin: Positive for color change.  All other systems reviewed and are negative.    Physical Exam Updated Vital Signs BP 134/74   Pulse 64   Temp 98.5 F (36.9 C) (Oral)   Resp 20   Ht 4\' 11"  (1.499 m)   Wt 65.8 kg (145 lb)   SpO2 100%   BMI 29.29 kg/m   Physical Exam  Constitutional: She is oriented to person, place, and time. She appears well-developed and well-nourished. No distress.  HENT:  Head: Normocephalic and atraumatic.  Right Ear: Hearing normal.  Left Ear: Hearing normal.  Nose: Nose normal.  Mouth/Throat: Oropharynx is clear and moist and mucous membranes are normal.  Eyes: Conjunctivae and EOM are normal. Pupils are equal, round, and reactive to light.  Neck: Normal range of motion. Neck supple.  Cardiovascular: Regular rhythm, S1 normal and S2 normal. Exam reveals no gallop and no friction rub.  No murmur heard. Pulmonary/Chest: Effort normal and breath sounds normal. No respiratory distress. She exhibits no tenderness.  Abdominal: Soft. Normal appearance and bowel sounds are normal. There is no hepatosplenomegaly. There is no tenderness. There is no rebound, no guarding, no tenderness at McBurney's point and negative Murphy's sign. No hernia.  Musculoskeletal: Normal range of motion.       Left hand: She exhibits tenderness and swelling.  Tenderness and swelling with overlying erythema, no fluctuance or induration the proximal phalanx of second and fourth fingers left hand   Neurological: She is alert and oriented to person, place, and time. She has normal strength. No cranial nerve deficit or sensory deficit. Coordination normal. GCS eye subscore is 4. GCS verbal subscore is 5. GCS motor subscore is 6.  Skin: Skin is warm, dry and intact. No rash noted. No cyanosis.  Psychiatric: She has a normal mood and affect. Her speech is normal and behavior is normal. Thought content normal.  Nursing note and vitals reviewed.    ED Treatments / Results  Labs (all labs ordered are listed, but only abnormal results are displayed) Labs Reviewed  CBC WITH DIFFERENTIAL/PLATELET - Abnormal; Notable for the following components:      Result Value   RBC 3.78 (*)    Hemoglobin 11.2 (*)    All other components within normal limits  BASIC METABOLIC PANEL - Abnormal; Notable for the following components:   Glucose, Bld 124 (*)    BUN 28 (*)    Creatinine, Ser 1.24 (*)    GFR calc non Af Amer 40 (*)    GFR calc Af Amer 46 (*)    All other components within normal limits  SEDIMENTATION RATE - Abnormal; Notable for the following components:   Sed Rate 76 (*)    All other components within normal limits  URIC ACID - Abnormal; Notable for the following components:   Uric Acid, Serum 8.5 (*)    All other components within normal limits  C-REACTIVE PROTEIN    EKG  EKG Interpretation None       Radiology Dg Hand Complete Left  Result Date: 03/14/2017 CLINICAL DATA:  Left hand is swollen, warm and red. Swelling and redness starting today. EXAM: LEFT HAND - COMPLETE 3+ VIEW COMPARISON:  None. FINDINGS: Diffuse osteopenia. No acute or suspicious osseous lesion. No fracture line or displaced fracture fragment. No destructive changes to suggest osteomyelitis. Prominent soft tissue swelling involving the second and fourth digits. Questionable erosions involving the distal diaphyses of the second and fourth proximal phalanx. Mild degenerative change at the first [facility] joint.  Additional mild degenerative change at the second and fourth PIP joints. IMPRESSION: 1. Prominent soft tissue swelling involving the second and fourth digits, centered adjacent to the distal portions of the second and fourth proximal phalanx. No acute or suspicious osseous finding. However, there are mild chronic appearing erosions involving the distal diaphyses of the second and fourth proximal phalanx suggesting chronic inflammatory arthritis with differential considerations of psoriatic arthritis, gout and rheumatoid arthritis. 2. No acute appearing osseous abnormality. No evidence of osteomyelitis. 3. Mild degenerative change, as detailed above. Electronically Signed   By: Franki Cabot M.D.   On: 03/14/2017 22:55    Procedures .Marland KitchenIncision and Drainage Date/Time: 03/15/2017 2:26 AM Performed by: Orpah Greek, MD Authorized by: Orpah Greek, MD   Consent:    Consent obtained:  Verbal   Consent given by:  Patient   Risks discussed:  Bleeding and pain Universal protocol:    Procedure explained and questions answered to patient or proxy's satisfaction: yes     Site/side marked: yes     Immediately prior to procedure a time out was called: yes     Patient identity confirmed:  Verbally with patient Location:    Location:  Upper extremity   Upper extremity location:  Finger   Finger location:  L ring finger Pre-procedure details:    Skin preparation:  Antiseptic wash Anesthesia (see MAR for exact dosages):    Anesthesia method:  Local infiltration   Local anesthetic:  Lidocaine 1% w/o epi Procedure details:    Needle aspiration: yes     Needle size:  18 G   Drainage amount: none. Comments:     Attempted aspiration with an 18-gauge needle did not reveal any evidence of fluid collection   (including critical care time)  Medications Ordered in ED Medications  vancomycin (VANCOCIN) IVPB 1000 mg/200 mL premix (1,000 mg Intravenous New Bag/Given 03/15/17 0218)   lidocaine (PF) (XYLOCAINE) 1 % injection 5 mL (5 mLs Infiltration Given 03/15/17 0217)     Initial Impression / Assessment and Plan / ED Course  I have reviewed the triage vital signs and the nursing notes.  Pertinent labs & imaging results that were available during my care of the patient were reviewed by me and considered  in my medical decision making (see chart for details).     Patient presents with swelling of the base of her second and fourth fingers on the left hand.  Patient reports a history of rheumatoid arthritis as well as gout.  But the area is tender to the touch, swollen and erythematous with some warmth.  No induration or fluctuance.  I recommended aspiration to rule out abscess.  This was performed and no fluid collections were encountered.  X-ray suspicious for chronic rheumatoid arthritis or gout.  She does have an elevated uric acid level.  Sed rate, however, is elevated.  Cannot rule out infectious etiology.  If this is not involving the joint of the hand, area of maximal swelling, redness and tenderness is over the midshaft of the proximal phalanx.  This could possibly be gouty tophus.  Patient treated empirically with IV antibiotics, specifically vancomycin.  As she does not have an elevated white blood cell count, is afebrile in the area is extremely limited on the hand, with no compromise to motor function, can continue outpatient antibiotics with close follow-up.  Return if symptoms worsen.  Final Clinical Impressions(s) / ED Diagnoses   Final diagnoses:  Cellulitis of finger of left hand    ED Discharge Orders    None       Rahkim Rabalais, Gwenyth Allegra, MD 03/15/17 [phone]

## 2017-03-14 NOTE — ED Notes (Signed)
Ice Pack Applied to left hand to reduce swelling

## 2017-03-14 NOTE — ED Triage Notes (Signed)
Pt's left hand is swollen, warm and red. Swelling and redness started today. Pt's ring finger on her left hand is very swollen and red and needs her rings removed from that finger. Ron, charge in triage with pt to try and remove rings.

## 2017-03-15 LAB — URIC ACID: URIC ACID, SERUM: 8.5 mg/dL — AB (ref 2.3–6.6)

## 2017-03-15 LAB — BASIC METABOLIC PANEL
Anion gap: 13 (ref 5–15)
BUN: 28 mg/dL — AB (ref 6–20)
CO2: 27 mmol/L (ref 22–32)
Calcium: 9.5 mg/dL (ref 8.9–10.3)
Chloride: 103 mmol/L (ref 101–111)
Creatinine, Ser: 1.24 mg/dL — ABNORMAL HIGH (ref 0.44–1.00)
GFR calc non Af Amer: 40 mL/min — ABNORMAL LOW (ref >60)
GFR, EST AFRICAN AMERICAN: 46 mL/min — AB (ref >60)
Glucose, Bld: 124 mg/dL — ABNORMAL HIGH (ref 65–99)
Potassium: 3.9 mmol/L (ref 3.5–5.1)
SODIUM: 143 mmol/L (ref 135–145)

## 2017-03-15 LAB — SEDIMENTATION RATE: Sed Rate: 76 mm/hr — ABNORMAL HIGH (ref 0–22)

## 2017-03-15 LAB — C-REACTIVE PROTEIN: CRP: 2.1 mg/dL — ABNORMAL HIGH (ref <1.0)

## 2017-03-15 MED ORDER — LIDOCAINE HCL (PF) 1 % IJ SOLN
5.0000 mL | Freq: Once | INTRAMUSCULAR | Status: AC
  Administered 2017-03-15: 5 mL
  Filled 2017-03-15: qty 6

## 2017-03-15 MED ORDER — VANCOMYCIN HCL IN DEXTROSE 1-5 GM/200ML-% IV SOLN
1000.0000 mg | Freq: Once | INTRAVENOUS | Status: AC
  Administered 2017-03-15: 1000 mg via INTRAVENOUS
  Filled 2017-03-15: qty 200

## 2017-03-15 MED ORDER — DOXYCYCLINE HYCLATE 100 MG PO CAPS: 100.0000 mg | ORAL_CAPSULE | Freq: Two times a day (BID) | ORAL | 0 refills | Status: DC

## 2017-03-16 ENCOUNTER — Encounter: Payer: Self-pay | Admitting: Family Medicine

## 2017-03-16 ENCOUNTER — Ambulatory Visit (INDEPENDENT_AMBULATORY_CARE_PROVIDER_SITE_OTHER): Payer: PPO | Admitting: Family Medicine

## 2017-03-16 VITALS — BP 139/80 | HR 82 | Temp 98.3°F | Ht 59.0 in | Wt 146.0 lb

## 2017-03-16 DIAGNOSIS — M8580 Other specified disorders of bone density and structure, unspecified site: Secondary | ICD-10-CM | POA: Diagnosis not present

## 2017-03-16 DIAGNOSIS — Z8269 Family history of other diseases of the musculoskeletal system and connective tissue: Secondary | ICD-10-CM | POA: Diagnosis not present

## 2017-03-16 DIAGNOSIS — L03114 Cellulitis of left upper limb: Secondary | ICD-10-CM | POA: Diagnosis not present

## 2017-03-16 NOTE — Progress Notes (Signed)
Subjective: CC: ED follow up PCP: [name], MD HDQ:QIWL [name] is a 84 y.o. female presenting to clinic today for:  1. ED follow up Patient seen 2 days ago at the urgent care for pain and swelling in the left second and fourth digits of the hand.  She was urgently referred to [facility] for further evaluation. Joint aspiration was attempted but no fluid aspirated.  She had both an elevated sedimentation rate and an elevated uric acid level.  Imaging concerning for possible rheumatoid vs gout mediated activity.  She does have a family history significant for rheumatoid arthritis in her sister, daughter and granddaughter.  She denies personal history of rheumatoid arthritis.  She was treated with IV vancomycin and, given lack of leukocytosis and fever, she was discharged with outpatient empiric antibiotics to continue with close follow-up with her PCP.  She was discharged with doxycycline 100 mg to take p.o. twice daily.  Today she presents for ED follow up and notes that swelling has decreased tremendously since she saw the emergency doctor.  She reports that there is no tenderness to palpation.  She reports very mild discomfort with attempting to form a fist.  No numbness or tingling.  No fevers.  Tolerating doxycycline without difficulty.  Denies nausea, vomiting, diarrhea.   ROS: Per HPI  No Known Allergies Past Medical History:  Diagnosis Date  . Chronic lung disease    on home O2   . Complete heart block (HCC)    s/p PPM by Dr Rayann Heman  . Debilitated   . Diastolic dysfunction   . Hypertension   . Lymphedema of right lower extremity   . MI (mitral incompetence)   . Obesity   . Permanent atrial fibrillation ([place])   . Pulmonary hypertension ([place])   . Rheumatoid arthritis(714.0)   . Sleep apnea   . Stroke [facility])    remote  . Venous insufficiency    with chronic leg ulcers    Current Outpatient Medications:  .  donepezil (ARICEPT) 10 MG tablet, Take 1 tablet  (10 mg total) by mouth at bedtime., Disp: 90 tablet, Rfl: 1 .  doxycycline (VIBRAMYCIN) 100 MG capsule, Take 1 capsule (100 mg total) by mouth 2 (two) times daily., Disp: 20 capsule, Rfl: 0 .  furosemide (LASIX) 40 MG tablet, TAKE 1 TABLET EVERY MORNING AND TAKE 1/2 TABLET AT LUNCH, Disp: 135 tablet, Rfl: 1 .  hydrocortisone (PROCTOZONE-HC) 2.5 % rectal cream, Place 1 application rectally 2 (two) times daily., Disp: 30 g, Rfl: 0 .  levothyroxine (SYNTHROID, LEVOTHROID) 50 MCG tablet, TAKE ONE (1) TABLET EACH DAY, Disp: 90 tablet, Rfl: 2 .  linaclotide (LINZESS) 72 MCG capsule, Take 1 capsule (72 mcg total) by mouth daily. To regulate bowel movements, Disp: 30 capsule, Rfl: 2 .  memantine (NAMENDA) 10 MG tablet, Take 1 tablet (10 mg total) by mouth 2 (two) times daily., Disp: 180 tablet, Rfl: 1 .  metoprolol succinate (TOPROL-XL) 25 MG 24 hr tablet, TAKE 1/2 TABLET DAILY, Disp: 45 tablet, Rfl: 0 .  Multiple Vitamins-Minerals (MULTIVITAMIN WITH MINERALS) tablet, Take 1 tablet by mouth daily., Disp: , Rfl:  .  OXYGEN-HELIUM IN, Inhale 4 L into the lungs continuous. 4L, Disp: , Rfl:  .  QUEtiapine (SEROQUEL) 25 MG tablet, Take 1 tablet (25 mg total) by mouth at bedtime., Disp: 90 tablet, Rfl: 1 .  Rivaroxaban (XARELTO) 15 MG TABS tablet, Take 1 tablet (15 mg total) by mouth daily with supper., Disp: 28 tablet,  Rfl: 0 .  simvastatin (ZOCOR) 20 MG tablet, TAKE ONE (1) TABLET EACH DAY, Disp: 90 tablet, Rfl: 1 .  traMADol (ULTRAM) 50 MG tablet, Take 1 tablet (50 mg total) by mouth 3 (three) times daily as needed for moderate pain., Disp: 60 tablet, Rfl: 3 Social History   Socioeconomic History  . Marital status: Married    Spouse name: Not on file  . Number of children: Not on file  . Years of education: Not on file  . Highest education level: Not on file  Social Needs  . Financial resource strain: Not on file  . Food insecurity - worry: Not on file  . Food insecurity - inability: Not on file  .  Transportation needs - medical: Not on file  . Transportation needs - non-medical: Not on file  Occupational History  . Not on file  Tobacco Use  . Smoking status: Never Smoker  . Smokeless tobacco: Never Used  Substance and Sexual Activity  . Alcohol use: No    Alcohol/week: 0.0 oz  . Drug use: No  . Sexual activity: Not on file  Other Topics Concern  . Not on file  Social History Narrative   Lives in [place]         Family History  Problem Relation Age of Onset  . Colon cancer Mother   . Alzheimer's disease Mother   . Congestive Heart Failure Father     Objective: Office vital signs reviewed. BP 139/80   Pulse 82   Temp 98.3 F (36.8 C) (Oral)   Ht 4\' 11"  (1.499 m)   Wt 146 lb (66.2 kg)   BMI 29.49 kg/m   Physical Examination:  General: Awake, alert, well nourished, nontoxic appearing female, No acute distress Extremities: warm, well perfused; no cyanosis or clubbing; +2 pulses bilaterally  Left hand: Joint swelling appreciated at the PIP of the second digit and fourth digit.  She has limited active range of motion secondary to swelling, particularly when forming a fist.  Minimal tenderness to palpation.  Very mild increased warmth over these joints.  There is associated erythema.  See photo below. Skin: dry; intact; erythematous as above. Neuro: Light touch sensation intact.     Assessment/ Plan: 84 y.o. female   1. Cellulitis of left hand Her daughter brings in photographs from onset of joint swelling.  I agree that the joint swelling appears significantly better than onset.  These photographs were sent via my chart to her file.  She is being treated as cellulitis w/ empiric doxycycline 100 mg p.o. twice daily.  She is status post 1 dose of IV vancomycin in the emergency department.  Because swelling and pain have improved with doxycycline, I did recommend that she continue the antibiotic and completed.  However, I am more suspicious for an inflammatory  arthritis causing her symptoms.  She has a significant family history of rheumatoid arthritis.  She does have some bony changes appreciated on x-ray that suggest an inflammatory arthritis like RA.  For this reason, I have ordered a rheumatoid panel to further evaluate.  We will also repeat a CBC today.  Home care instructions and reasons for emergent evaluation were reviewed with the patient and her daughter.  They voiced good understanding will follow-up as directed.  I recommended that they follow-up with PCP if joint swelling has not totally resolved after completion of antibiotics.  Would consider empiric steroids at that point to cover for RA versus gout.  She is currently anticoagulated  would not be able to tolerate an oral NSAID. - CBC with Differential  2. Bone erosion determined by x-ray - ANA,IFA RA Diag Pnl w/rflx Tit/Patn - CBC with Differential  3. Family history of rheumatic joint disease - ANA,IFA RA Diag Pnl w/rflx Tit/Patn - CBC with Differential   Orders Placed This Encounter  Procedures  . ANA,IFA RA Diag Pnl w/rflx Tit/Patn  . CBC with Differential    Janora Norlander, [place] [phone]

## 2017-03-18 ENCOUNTER — Other Ambulatory Visit: Payer: Self-pay | Admitting: Family Medicine

## 2017-03-18 DIAGNOSIS — M25442 Effusion, left hand: Secondary | ICD-10-CM

## 2017-03-18 DIAGNOSIS — R768 Other specified abnormal immunological findings in serum: Secondary | ICD-10-CM

## 2017-03-18 LAB — CBC WITH DIFFERENTIAL/PLATELET
BASOS ABS: 0 10*3/uL (ref 0.0–0.2)
Basos: 0 %
EOS (ABSOLUTE): 0.1 10*3/uL (ref 0.0–0.4)
Eos: 1 %
HEMOGLOBIN: 11.7 g/dL (ref 11.1–15.9)
Hematocrit: 36.5 % (ref 34.0–46.6)
Immature Grans (Abs): 0 10*3/uL (ref 0.0–0.1)
Immature Granulocytes: 0 %
LYMPHS ABS: 1.9 10*3/uL (ref 0.7–3.1)
Lymphs: 22 %
MCH: 29.5 pg (ref 26.6–33.0)
MCHC: 32.1 g/dL (ref 31.5–35.7)
MCV: 92 fL (ref 79–97)
MONOCYTES: 10 %
Monocytes Absolute: 0.8 10*3/uL (ref 0.1–0.9)
NEUTROS ABS: 5.7 10*3/uL (ref 1.4–7.0)
Neutrophils: 67 %
Platelets: 298 10*3/uL (ref 150–379)
RBC: 3.97 x10E6/uL (ref 3.77–5.28)
RDW: 13.7 % (ref 12.3–15.4)
WBC: 8.5 10*3/uL (ref 3.4–10.8)

## 2017-03-18 LAB — FANA STAINING PATTERNS: Homogeneous Pattern: 1:80 {titer}

## 2017-03-18 LAB — ANA,IFA RA DIAG PNL W/RFLX TIT/PATN
ANA TITER 1: POSITIVE — AB
CYCLIC CITRULLIN PEPTIDE AB: 4 U (ref 0–19)

## 2017-04-08 ENCOUNTER — Encounter: Payer: Self-pay | Admitting: Cardiology

## 2017-04-08 ENCOUNTER — Ambulatory Visit: Payer: PPO | Admitting: Cardiology

## 2017-04-08 VITALS — BP 140/84 | HR 63 | Ht 59.0 in | Wt 146.2 lb

## 2017-04-08 DIAGNOSIS — I442 Atrioventricular block, complete: Secondary | ICD-10-CM | POA: Diagnosis not present

## 2017-04-08 DIAGNOSIS — I4891 Unspecified atrial fibrillation: Secondary | ICD-10-CM

## 2017-04-08 DIAGNOSIS — I38 Endocarditis, valve unspecified: Secondary | ICD-10-CM

## 2017-04-08 DIAGNOSIS — I272 Pulmonary hypertension, unspecified: Secondary | ICD-10-CM

## 2017-04-08 NOTE — Patient Instructions (Signed)

## 2017-04-08 NOTE — Progress Notes (Signed)
Clinical Summary Ms. Jalomo is a 84 y.o.female seen today for follow up of the following medical problems.   1. Heart block - pacemaker followed by Dr Rayann Heman  Jan 2019 normal pacemaker function. No recent symptoms.    2. PAF   - no recent palpitations. No bleeding on xarelto.   3. Valvular heart disease - 09/2016 echo mild to mod AI, mild AS, mod MR, mod TR.   - breathing is stable. No recent edema  4. Leg swelling - compliant with compression stockings and diuretics  - edema currently controlled  5. Low normal to mildly decreased LV function - 09/2016 echo LVEF 45-50%. Limited visualization, could not eval diastolic function. Severe biatrail enlargement suggests significant diastolic dysfunction  - no recent symptoms since last visit.   6. Pulmonary HTN - PASP 65 by last echo - suspected related to left sided heart disease with evidence of advanced diastolic dysfunction, as well as chronic lung disease with O2 depending COPD.   Past Medical History:  Diagnosis Date  . Chronic lung disease    on home O2   . Complete heart block (HCC)    s/p PPM by Dr Rayann Heman  . Debilitated   . Diastolic dysfunction   . Hypertension   . Lymphedema of right lower extremity   . MI (mitral incompetence)   . Obesity   . Permanent atrial fibrillation ([place])   . Pulmonary hypertension ([place])   . Rheumatoid arthritis(714.0)   . Sleep apnea   . Stroke [facility])    remote  . Venous insufficiency    with chronic leg ulcers     No Known Allergies   Current Outpatient Medications  Medication Sig Dispense Refill  . donepezil (ARICEPT) 10 MG tablet Take 1 tablet (10 mg total) by mouth at bedtime. 90 tablet 1  . doxycycline (VIBRAMYCIN) 100 MG capsule Take 1 capsule (100 mg total) by mouth 2 (two) times daily. 20 capsule 0  . furosemide (LASIX) 40 MG tablet TAKE 1 TABLET EVERY MORNING AND TAKE 1/2 TABLET AT LUNCH 135 tablet 1  . hydrocortisone (PROCTOZONE-HC) 2.5 % rectal  cream Place 1 application rectally 2 (two) times daily. 30 g 0  . levothyroxine (SYNTHROID, LEVOTHROID) 50 MCG tablet TAKE ONE (1) TABLET EACH DAY 90 tablet 2  . linaclotide (LINZESS) 72 MCG capsule Take 1 capsule (72 mcg total) by mouth daily. To regulate bowel movements 30 capsule 2  . memantine (NAMENDA) 10 MG tablet Take 1 tablet (10 mg total) by mouth 2 (two) times daily. 180 tablet 1  . metoprolol succinate (TOPROL-XL) 25 MG 24 hr tablet TAKE 1/2 TABLET DAILY 45 tablet 0  . Multiple Vitamins-Minerals (MULTIVITAMIN WITH MINERALS) tablet Take 1 tablet by mouth daily.    . OXYGEN-HELIUM IN Inhale 4 L into the lungs continuous. 4L    . QUEtiapine (SEROQUEL) 25 MG tablet Take 1 tablet (25 mg total) by mouth at bedtime. 90 tablet 1  . Rivaroxaban (XARELTO) 15 MG TABS tablet Take 1 tablet (15 mg total) by mouth daily with supper. 28 tablet 0  . simvastatin (ZOCOR) 20 MG tablet TAKE ONE (1) TABLET EACH DAY 90 tablet 1  . traMADol (ULTRAM) 50 MG tablet Take 1 tablet (50 mg total) by mouth 3 (three) times daily as needed for moderate pain. 60 tablet 3   No current facility-administered medications for this visit.      Past Surgical History:  Procedure Laterality Date  . PACEMAKER INSERTION  08/13/10  SJM by Dr Rayann Heman     No Known Allergies    Family History  Problem Relation Age of Onset  . Colon cancer Mother   . Alzheimer's disease Mother   . Congestive Heart Failure Father      Social History Ms. Pepin reports that  has never smoked. she has never used smokeless tobacco. Ms. Christine reports that she does not drink alcohol.   Review of Systems CONSTITUTIONAL: No weight loss, fever, chills, weakness or fatigue.  HEENT: Eyes: No visual loss, blurred vision, double vision or yellow sclerae.No hearing loss, sneezing, congestion, runny nose or sore throat.  SKIN: No rash or itching.  CARDIOVASCULAR: per hpi RESPIRATORY: per hpi GASTROINTESTINAL: No anorexia, nausea,  vomiting or diarrhea. No abdominal pain or blood.  GENITOURINARY: No burning on urination, no polyuria NEUROLOGICAL: No headache, dizziness, syncope, paralysis, ataxia, numbness or tingling in the extremities. No change in bowel or bladder control.  MUSCULOSKELETAL: No muscle, back pain, joint pain or stiffness.  LYMPHATICS: No enlarged nodes. No history of splenectomy.  PSYCHIATRIC: No history of depression or anxiety.  ENDOCRINOLOGIC: No reports of sweating, cold or heat intolerance. No polyuria or polydipsia.  Marland Kitchen   Physical Examination Vitals:   04/08/17 1045  BP: 140/84  Pulse: 63  SpO2: 100%   Vitals:   04/08/17 1045  Weight: 146 lb 3.2 oz (66.3 kg)  Height: 4\' 11"  (1.499 m)    Gen: resting comfortably, no acute distress HEENT: no scleral icterus, pupils equal round and reactive, no palptable cervical adenopathy,  CV: irreg, 2/6 systolic murmur rusb and apex, no jvd Resp: Clear to auscultation bilaterally GI: abdomen is soft, non-tender, non-distended, normal bowel sounds, no hepatosplenomegaly MSK: extremities are warm, no edema.  Skin: warm, no rash Neuro:  no focal deficits Psych: appropriate affect   Diagnostic Studies 04/2014 Echo Study Conclusions  - Left ventricle: The cavity size was normal. Wall thickness was normal. Systolic function was normal. The estimated ejection fraction was in the range of 55% to 60%. Abnormal diastolic function, indeterminate grade. Wall motion was normal; there were no regional wall motion abnormalities. - Aortic valve: Mildly calcified annulus. Trileaflet; mildly thickened leaflets. There was mild stenosis. There was mild to moderate regurgitation. Mean gradient (S): 13 mm Hg. Valve area (VTI): 1.56 cm^2. Valve area (Vmax): 1.45 cm^2. Valve area (Vmean): 1.34 cm^2. Regurgitation pressure half-time: 542 ms. - Mitral valve: Mildly to moderately calcified annulus. Mildly thickened leaflets . There was moderate  regurgitation. The MR VC is 0.5 cm. - Left atrium: The atrium was severely dilated. - Right ventricle: The cavity size was mildly dilated. - Right atrium: The atrium was severely dilated. - Tricuspid valve: There are 2 seperate TR jets. TR VC for jet 1 is 4.0 cm, the TR VC for jet 2 is 0.3 cm. The composite of the jets is moderate to severe TR. - Pulmonary arteries: Systolic pressure was moderately increased. PA peak pressure: 57 mm Hg (S). - Inferior vena cava: The vessel was normal in size. The respirophasic diameter changes were in the normal range (>= 50%), consistent with normal central venous pressure. - Technically adequate study.   09/2016 echo Study Conclusions  - Left ventricle: The cavity size was normal. Wall thickness was normal. Diastolic dysfunction, grade indeterminate. Systolic function was mildly reduced. The estimated ejection fraction was in the range of 45% to 50%. Diffuse hypokinesis. Doppler parameters are consistent with high ventricular filling pressure. - Regional wall motion abnormality: Mild hypokinesis of the apical  myocardium. - Ventricular septum: Septal motion showed abnormal function and dyssynergy. These changes are consistent with right ventricular pacing. - Aortic valve: Moderately calcified annulus. Mildly thickened leaflets. There was mild stenosis. There was mild to moderate regurgitation. Peak velocity (S): 225 cm/s. Mean gradient (S): 11 mm Hg. - Mitral valve: Moderately to severely calcified annulus. Mildly thickened leaflets . There was moderate eccentric regurgitation. - Left atrium: The atrium was severely dilated. - Right ventricle: The cavity size was mildly dilated. Pacer wire or catheter noted in right ventricle. Systolic function was mildly reduced. - Right atrium: The atrium was severely dilated. - Tricuspid valve: There was moderate regurgitation. - Pulmonic valve: There was mild  regurgitation. - Pulmonary arteries: Systolic pressure was moderately to severely increased. PA peak pressure: 65 mm Hg (S).  Impressions:  - Systolic function was difficult to assess in all views, but overall appeared mildly reduced, LVEF 45-50%. Consider a repeat limited study with contrast for a more accurate assessment of LVEF and regional wall motion.     Assessment and Plan  1. Heart block - pacemaker management per Dr Rayann Heman - no symptoms, normal device check recently.   2. Valvular heart disease - no symptoms, continue to monitor.   3. LE edema - controlled, continue current therapy  4.Afib - CHADS2vasc score is 3, continue anticoag - no symptoms, continue current meds  5. Pulm HTN - no symptoms, continue current medical therapy. No indication for pulmonary vasodilators, primarily left sided heart disease and COPD as etiology   F/u 6 monhts   Arnoldo Lenis, M.D.

## 2017-04-12 ENCOUNTER — Encounter: Payer: Self-pay | Admitting: Cardiology

## 2017-05-11 ENCOUNTER — Encounter: Payer: Self-pay | Admitting: Family Medicine

## 2017-05-11 ENCOUNTER — Ambulatory Visit (INDEPENDENT_AMBULATORY_CARE_PROVIDER_SITE_OTHER): Payer: PPO | Admitting: Family Medicine

## 2017-05-11 ENCOUNTER — Ambulatory Visit (INDEPENDENT_AMBULATORY_CARE_PROVIDER_SITE_OTHER): Payer: PPO

## 2017-05-11 VITALS — BP 126/67 | HR 72 | Temp 97.2°F | Ht 59.0 in

## 2017-05-11 DIAGNOSIS — J40 Bronchitis, not specified as acute or chronic: Secondary | ICD-10-CM | POA: Diagnosis not present

## 2017-05-11 DIAGNOSIS — E039 Hypothyroidism, unspecified: Secondary | ICD-10-CM

## 2017-05-11 DIAGNOSIS — R05 Cough: Secondary | ICD-10-CM

## 2017-05-11 DIAGNOSIS — G301 Alzheimer's disease with late onset: Secondary | ICD-10-CM

## 2017-05-11 DIAGNOSIS — J329 Chronic sinusitis, unspecified: Secondary | ICD-10-CM | POA: Diagnosis not present

## 2017-05-11 DIAGNOSIS — D508 Other iron deficiency anemias: Secondary | ICD-10-CM | POA: Diagnosis not present

## 2017-05-11 DIAGNOSIS — F0281 Dementia in other diseases classified elsewhere with behavioral disturbance: Secondary | ICD-10-CM

## 2017-05-11 DIAGNOSIS — R059 Cough, unspecified: Secondary | ICD-10-CM

## 2017-05-11 DIAGNOSIS — I10 Essential (primary) hypertension: Secondary | ICD-10-CM

## 2017-05-11 DIAGNOSIS — I420 Dilated cardiomyopathy: Secondary | ICD-10-CM

## 2017-05-11 MED ORDER — AMOXICILLIN-POT CLAVULANATE 875-125 MG PO TABS: 1.0000 | ORAL_TABLET | Freq: Two times a day (BID) | ORAL | 0 refills | Status: DC

## 2017-05-11 NOTE — Progress Notes (Signed)
Subjective:  Patient ID: [name], female    DOB: 1933/06/25  Age: 84 y.o. MRN: [id number]  CC: Follow-up (pt here today for follow up of her chronic medical conditions and also c/o cough and congestion )   HPI [name] presents for recheck of her Alzheimer's disease and chronic iron deficiency anemia.  Daughter says that they are not going back to a hematologist unless abnormal when checked here. Patient presents for follow-up on  thyroid. The patient has a history of hypothyroidism. It has been stable recently. Pt. denies any change in  voice, loss of hair, heat or cold intolerance. Energy level has been adequate to good. Patient denies significant constipation and diarrhea. No myxedema. Medication is as noted below. Verified that pt is taking it daily on an empty stomach. Well tolerated.   Follow-up of hypertension. Patient has no history of headache or chest pain.  With the exception of the recent onset of current symptoms there has been no shortness of breath or recent cough. Patient also denies symptoms of TIA such as numbness weakness lateralizing.  Patient denies side effects from medication. States taking it regularly.   Patient has history of dilated cardiomyopathy.  She follows with cardiology.  Recently there is no chest pain or shortness of breath or edema other than that specifically related to the acute symptoms as noted below.  Patient has for the last 3-4 days had increasing cough.  This is a loose cough.  They have been using Mucinex syrup.  The cough seems to be getting worse and there is some shortness of breath associated.  There is been no fever chills or sweats.  Depression screen [facility] 2/9 03/16/2017 12/02/2016 10/20/2016  Decreased Interest 0 0 0  Down, Depressed, Hopeless 0 0 0  PHQ - 2 Score 0 0 0    History Mychael has a past medical history of Chronic lung disease, Complete heart block (HCC), Debilitated, Diastolic dysfunction, Hypertension, Lymphedema  of right lower extremity, MI (mitral incompetence), Obesity, Permanent atrial fibrillation ([place]), Pulmonary hypertension ([place]), Rheumatoid arthritis(714.0), Sleep apnea, Stroke ([place]), and Venous insufficiency.   She has a past surgical history that includes Pacemaker insertion (08/13/10).   Her family history includes Alzheimer's disease in her mother; Colon cancer in her mother; Congestive Heart Failure in her father.She reports that she has never smoked. She has never used smokeless tobacco. She reports that she does not drink alcohol or use drugs.    ROS Review of Systems  Constitutional: Negative.   HENT: Positive for congestion and postnasal drip.   Eyes: Negative for visual disturbance.  Respiratory: Positive for cough and shortness of breath.   Cardiovascular: Negative for chest pain.  Gastrointestinal: Negative for abdominal pain, constipation, diarrhea, nausea and vomiting.  Genitourinary: Negative for difficulty urinating.  Musculoskeletal: Negative for arthralgias and myalgias.  Neurological: Negative for headaches.  Psychiatric/Behavioral: Negative for sleep disturbance.    Objective:  BP 126/67   Pulse 72   Temp (!) 97.2 F (36.2 C) (Oral)   Ht _0  (1.499 m)   BMI 29.53 kg/m   BP Readings from Last 3 Encounters:  05/11/17 126/67  04/08/17 140/84  03/16/17 139/80    Wt Readings from Last 3 Encounters:  04/08/17 146 lb 3.2 oz (66.3 kg)  03/16/17 146 lb (66.2 kg)  03/14/17 145 lb (65.8 kg)     Physical Exam  Constitutional: She is oriented to person, place, and time. She appears well-developed and well-nourished. No distress.  HENT:  Head: Normocephalic and atraumatic.  Right Ear: External ear normal.  Left Ear: External ear normal.  Neck: Normal range of motion. Neck supple.  Cardiovascular: Normal rate, regular rhythm and normal heart sounds.  No murmur heard. Pulmonary/Chest: Effort normal. No respiratory distress. She has wheezes (Scattered  throughout all lung fields). She has no rales.  Abdominal: Soft. There is no tenderness.  Musculoskeletal: Normal range of motion. She exhibits no edema or tenderness.  Neurological: She is alert and oriented to person, place, and time. She exhibits normal muscle tone. Coordination normal.  Skin: Skin is warm and dry.  Psychiatric: She has a normal mood and affect. Her behavior is normal.      Assessment & Plan:   [name] was seen today for follow-up.  Diagnoses and all orders for this visit:  Late onset Alzheimer's disease with behavioral disturbance  Essential hypertension  Cardiomyopathy, dilated (HCC)  Other iron deficiency anemia  Hypothyroidism, unspecified type -     CBC with Differential/Platelet -     CMP14+EGFR -     TSH -     T4, Free  Cough -     DG Chest 2 View; Future  Sinobronchitis  Other orders -     amoxicillin-clavulanate (AUGMENTIN) 875-125 MG tablet; Take 1 tablet by mouth 2 (two) times daily.       I have discontinued [name]'s linaclotide. I am also having her start on amoxicillin-clavulanate. Additionally, I am having her maintain her OXYGEN-HELIUM IN, multivitamin with minerals, hydrocortisone, traMADol, Rivaroxaban, donepezil, furosemide, levothyroxine, memantine, QUEtiapine, simvastatin, and metoprolol succinate.  Allergies as of 05/11/2017   No Known Allergies     Medication List        Accurate as of 05/11/17 12:20 PM. Always use your most recent med list.          amoxicillin-clavulanate 875-125 MG tablet Commonly known as:  AUGMENTIN Take 1 tablet by mouth 2 (two) times daily.   donepezil 10 MG tablet Commonly known as:  ARICEPT Take 1 tablet (10 mg total) by mouth at bedtime.   furosemide 40 MG tablet Commonly known as:  LASIX TAKE 1 TABLET EVERY MORNING AND TAKE 1/2 TABLET AT LUNCH   hydrocortisone 2.5 % rectal cream Commonly known as:  PROCTOZONE-HC Place 1 application rectally 2 (two) times daily.     levothyroxine 50 MCG tablet Commonly known as:  SYNTHROID, LEVOTHROID TAKE ONE (1) TABLET EACH DAY   memantine 10 MG tablet Commonly known as:  NAMENDA Take 1 tablet (10 mg total) by mouth 2 (two) times daily.   metoprolol succinate 25 MG 24 hr tablet Commonly known as:  TOPROL-XL TAKE 1/2 TABLET DAILY   multivitamin with minerals tablet Take 1 tablet by mouth daily.   OXYGEN Inhale 4 L into the lungs continuous. 4L   QUEtiapine 25 MG tablet Commonly known as:  SEROQUEL Take 1 tablet (25 mg total) by mouth at bedtime.   Rivaroxaban 15 MG Tabs tablet Commonly known as:  XARELTO Take 1 tablet (15 mg total) by mouth daily with supper.   simvastatin 20 MG tablet Commonly known as:  ZOCOR TAKE ONE (1) TABLET EACH DAY   traMADol 50 MG tablet Commonly known as:  ULTRAM Take 1 tablet (50 mg total) by mouth 3 (three) times daily as needed for moderate pain.        Follow-up: Return in about 3 months (around 08/10/2017).  [name], M.D.

## 2017-05-11 NOTE — Progress Notes (Signed)
Your chest x-ray looked normal. Thanks, WS.

## 2017-05-12 LAB — CMP14+EGFR
A/G RATIO: 1.4 (ref 1.2–2.2)
ALK PHOS: 85 IU/L (ref 39–117)
ALT: 16 IU/L (ref 0–32)
AST: 19 IU/L (ref 0–40)
Albumin: 4.1 g/dL (ref 3.5–4.7)
BUN/Creatinine Ratio: 29 — ABNORMAL HIGH (ref 12–28)
BUN: 34 mg/dL — ABNORMAL HIGH (ref 8–27)
Bilirubin Total: <0.2 mg/dL (ref 0.0–1.2)
CO2: 27 mmol/L (ref 20–29)
Calcium: 9.1 mg/dL (ref 8.7–10.3)
Chloride: 101 mmol/L (ref 96–106)
Creatinine, Ser: 1.17 mg/dL — ABNORMAL HIGH (ref 0.57–1.00)
GFR calc Af Amer: 50 mL/min/{1.73_m2} — ABNORMAL LOW (ref >59)
GFR calc non Af Amer: 44 mL/min/{1.73_m2} — ABNORMAL LOW (ref >59)
GLOBULIN, TOTAL: 2.9 g/dL (ref 1.5–4.5)
Glucose: 79 mg/dL (ref 65–99)
POTASSIUM: 3.9 mmol/L (ref 3.5–5.2)
Sodium: 146 mmol/L — ABNORMAL HIGH (ref 134–144)
Total Protein: 7 g/dL (ref 6.0–8.5)

## 2017-05-12 LAB — CBC WITH DIFFERENTIAL/PLATELET
BASOS: 0 %
Basophils Absolute: 0 10*3/uL (ref 0.0–0.2)
EOS (ABSOLUTE): 0.1 10*3/uL (ref 0.0–0.4)
EOS: 1 %
HEMATOCRIT: 34.9 % (ref 34.0–46.6)
Hemoglobin: 11.3 g/dL (ref 11.1–15.9)
Immature Grans (Abs): 0 10*3/uL (ref 0.0–0.1)
Immature Granulocytes: 0 %
LYMPHS ABS: 1.8 10*3/uL (ref 0.7–3.1)
Lymphs: 16 %
MCH: 28.8 pg (ref 26.6–33.0)
MCHC: 32.4 g/dL (ref 31.5–35.7)
MCV: 89 fL (ref 79–97)
MONOS ABS: 1.2 10*3/uL — AB (ref 0.1–0.9)
Monocytes: 11 %
Neutrophils Absolute: 7.9 10*3/uL — ABNORMAL HIGH (ref 1.4–7.0)
Neutrophils: 72 %
Platelets: 279 10*3/uL (ref 150–379)
RBC: 3.92 x10E6/uL (ref 3.77–5.28)
RDW: 14.5 % (ref 12.3–15.4)
WBC: 11.1 10*3/uL — AB (ref 3.4–10.8)

## 2017-05-12 LAB — T4, FREE: FREE T4: 1.32 ng/dL (ref 0.82–1.77)

## 2017-05-12 LAB — TSH: TSH: 2.92 u[IU]/mL (ref 0.450–4.500)

## 2017-05-25 ENCOUNTER — Ambulatory Visit (INDEPENDENT_AMBULATORY_CARE_PROVIDER_SITE_OTHER): Payer: PPO | Admitting: *Deleted

## 2017-05-25 DIAGNOSIS — I442 Atrioventricular block, complete: Secondary | ICD-10-CM

## 2017-05-25 NOTE — Progress Notes (Signed)
Remote pacemaker transmission.   

## 2017-05-26 ENCOUNTER — Encounter: Payer: Self-pay | Admitting: Cardiology

## 2017-05-26 ENCOUNTER — Other Ambulatory Visit: Payer: Self-pay | Admitting: Family Medicine

## 2017-05-26 NOTE — Telephone Encounter (Signed)
Last seen 03/16/17  Dr Stacks 

## 2017-06-04 ENCOUNTER — Ambulatory Visit (INDEPENDENT_AMBULATORY_CARE_PROVIDER_SITE_OTHER): Payer: PPO | Admitting: Family

## 2017-06-04 ENCOUNTER — Encounter: Payer: Self-pay | Admitting: Family

## 2017-06-04 VITALS — BP 131/72 | HR 77 | Temp 98.5°F | Ht 59.0 in | Wt 147.0 lb

## 2017-06-04 DIAGNOSIS — M109 Gout, unspecified: Secondary | ICD-10-CM

## 2017-06-04 MED ORDER — PREDNISONE 10 MG (21) PO TBPK: ORAL_TABLET | ORAL | 0 refills | Status: DC

## 2017-06-04 MED ORDER — COLCHICINE 0.6 MG PO TABS: ORAL_TABLET | ORAL | 1 refills | Status: DC

## 2017-06-04 NOTE — Patient Instructions (Signed)

## 2017-06-04 NOTE — Progress Notes (Signed)
   Subjective:    Patient ID: [name], female    DOB: 15-Nov-1933, 84 y.o.   MRN: [id number]  Hand Pain   The incident occurred 12 to 24 hours ago. There was no injury mechanism. The pain is present in the right hand. The quality of the pain is described as aching. The pain does not radiate. The pain is at a severity of 9/10. The pain is moderate. The pain has been intermittent since the incident. Pertinent negatives include no numbness or tingling. Nothing aggravates the symptoms. Treatments tried: Ultram  The treatment provided no relief.      Review of Systems  Neurological: Negative for tingling and numbness.  All other systems reviewed and are negative.      Objective:   Physical Exam  Constitutional: She is oriented to person, place, and time. She appears well-developed and well-nourished. No distress.  HENT:  Head: Normocephalic.  Eyes: Pupils are equal, round, and reactive to light.  Neck: Normal range of motion. Neck supple. No thyromegaly present.  Cardiovascular: Normal rate, regular rhythm, normal heart sounds and intact distal pulses.  No murmur heard. Pulmonary/Chest: Effort normal. No respiratory distress. She has decreased breath sounds. She has no wheezes.  4 L continuous O2  Abdominal: Soft. Bowel sounds are normal. She exhibits no distension. There is no tenderness.  Musculoskeletal: Normal range of motion. She exhibits no edema or tenderness.  Neurological: She is alert and oriented to person, place, and time. She has normal reflexes.  Generalized weakness, in wheelchair   Skin: Skin is warm and dry. There is erythema.  Erythemas, warmth, tenderness and swelling of right hand   Psychiatric: She has a normal mood and affect. Her behavior is normal. Judgment and thought content normal.  Vitals reviewed.     BP 131/72   Pulse 77   Temp 98.5 F (36.9 C) (Oral)   Ht 4\' 11"  (1.499 m)   Wt 147 lb (66.7 kg)   BMI 29.69 kg/m      Assessment & Plan:   [name] was seen today for swollen, red, painful right hand.  Diagnoses and all orders for this visit:  Acute gout of right hand, unspecified cause -     colchicine 0.6 MG tablet; Take 1.2 mg ( 2 tabs) then one hour later take 0.6 mg (1 tab). 1.8 mg/day -     predniSONE (STERAPRED UNI-PAK 21 TAB) 10 MG (21) TBPK tablet; Use as directed   Low purine diet Keep elevated Start Colchicine and prednisone today  Keep follow up with PCP  Evelina Dun, FNP

## 2017-06-10 ENCOUNTER — Encounter: Payer: Self-pay | Admitting: Family Medicine

## 2017-06-15 ENCOUNTER — Other Ambulatory Visit: Payer: Self-pay | Admitting: Cardiology

## 2017-06-15 ENCOUNTER — Encounter: Payer: Self-pay | Admitting: Pediatrics

## 2017-06-15 ENCOUNTER — Ambulatory Visit (INDEPENDENT_AMBULATORY_CARE_PROVIDER_SITE_OTHER): Payer: PPO | Admitting: Pediatrics

## 2017-06-15 VITALS — BP 119/77 | HR 75 | Temp 98.9°F | Ht 59.0 in | Wt 142.4 lb

## 2017-06-15 DIAGNOSIS — R32 Unspecified urinary incontinence: Secondary | ICD-10-CM

## 2017-06-15 DIAGNOSIS — N183 Chronic kidney disease, stage 3 unspecified: Secondary | ICD-10-CM

## 2017-06-15 DIAGNOSIS — F0281 Dementia in other diseases classified elsewhere with behavioral disturbance: Secondary | ICD-10-CM | POA: Diagnosis not present

## 2017-06-15 DIAGNOSIS — I4821 Permanent atrial fibrillation: Secondary | ICD-10-CM

## 2017-06-15 DIAGNOSIS — E039 Hypothyroidism, unspecified: Secondary | ICD-10-CM

## 2017-06-15 DIAGNOSIS — I420 Dilated cardiomyopathy: Secondary | ICD-10-CM | POA: Diagnosis not present

## 2017-06-15 DIAGNOSIS — I5022 Chronic systolic (congestive) heart failure: Secondary | ICD-10-CM | POA: Diagnosis not present

## 2017-06-15 DIAGNOSIS — J9611 Chronic respiratory failure with hypoxia: Secondary | ICD-10-CM | POA: Diagnosis not present

## 2017-06-15 DIAGNOSIS — I482 Chronic atrial fibrillation: Secondary | ICD-10-CM | POA: Diagnosis not present

## 2017-06-15 DIAGNOSIS — G301 Alzheimer's disease with late onset: Secondary | ICD-10-CM | POA: Diagnosis not present

## 2017-06-15 DIAGNOSIS — F02818 Dementia in other diseases classified elsewhere, unspecified severity, with other behavioral disturbance: Secondary | ICD-10-CM

## 2017-06-15 LAB — CUP PACEART REMOTE DEVICE CHECK
Brady Statistic RV Percent Paced: 46 %
Date Time Interrogation Session: 20190429073242
Implantable Lead Model: 1948
Implantable Pulse Generator Implant Date: 20120717
Lead Channel Sensing Intrinsic Amplitude: 7.8 mV
Lead Channel Setting Pacing Amplitude: 2.5 V
MDC IDC LEAD IMPLANT DT: 20120717
MDC IDC LEAD LOCATION: 753860
MDC IDC MSMT BATTERY REMAINING LONGEVITY: 133 mo
MDC IDC MSMT BATTERY REMAINING PERCENTAGE: 95.5 %
MDC IDC MSMT BATTERY VOLTAGE: 2.96 V
MDC IDC MSMT LEADCHNL RV IMPEDANCE VALUE: 630 Ohm
MDC IDC MSMT LEADCHNL RV PACING THRESHOLD AMPLITUDE: 0.75 V
MDC IDC MSMT LEADCHNL RV PACING THRESHOLD PULSEWIDTH: 0.4 ms
MDC IDC SET LEADCHNL RV PACING PULSEWIDTH: 0.4 ms
MDC IDC SET LEADCHNL RV SENSING SENSITIVITY: 2.5 mV
Pulse Gen Serial Number: 7245638

## 2017-06-15 NOTE — Progress Notes (Signed)
Subjective:   Patient ID: [name], female    DOB: Jan 21, 1934, 84 y.o.   MRN: [id number] CC: Medical Management of Chronic Issues  HPI: [name] is a 84 y.o. female    Here today with two daughters to discuss moving into a nursing home.   Patient with history of Alzheimer's.  forgets things at times, doesn't remember to take medicines without being given. Can't remember if she has a meal or not.  Has to be reminded not to put on dirty clothes.   Incontinence.  Daily incontinent of urine.  Intermittently incontinent of stool.  Needs help with clothes changing.   Functional status: Can walk about 20-40 ft before needing rest. Using walker. Needs 24h supervision. Has had about 3 falls this year so far. Slips out of the bed sometimes.   stooling every 2-3 days, no pushing or straining. Needing medicated wipes for hemorrhoids.  Occasionally will be incontinent of stool.  Atrial fib: on blood thinner  Chronic systolic CHF: on 4L of O2. Gets out of breath is she walks too far. Sometimes forgets to keep oxygen on if someone is not reminding her.  Relevant past medical, surgical, family and social history reviewed. Allergies and medications reviewed and updated. Social History   Tobacco Use  Smoking Status Never Smoker  Smokeless Tobacco Never Used   ROS: Per HPI   Objective:    BP 119/77   Pulse 75   Temp 98.9 F (37.2 C) (Oral)   Ht 4\' 11"  (1.499 m)   Wt 142 lb 6 oz (64.6 kg)   SpO2 91%   BMI 28.76 kg/m   Wt Readings from Last 3 Encounters:  06/15/17 142 lb 6 oz (64.6 kg)  06/04/17 147 lb (66.7 kg)  04/08/17 146 lb 3.2 oz (66.3 kg)    Gen: NAD, alert, cooperative with exam, NCAT EYES: EOMI, no conjunctival injection, or no icterus ENT:  TMs dull gray b/l, OP without erythema LYMPH: no cervical LAD CV: NRRR, normal S1/S2 Resp: CTABL, no wheezes, normal WOB Abd: +BS, soft, NTND.  Ext:  trace bilateral pitting edema, warm Neuro: Alert and answering  questions, using walker for mobility Skin: Venous stasis changes and dry skin bilateral lower legs.  Some flaking skin.  Assessment & Plan:  [name] was seen today for medical management of chronic issues.  She has multiple diagnoses.  Family is worried about her ability to care for herself and I agree that she needs increased supervision.  She forgets to wear her oxygen if not reminded.  She will forget to take her medicine, eat meals or eat too often.  She is incontinent and needs help changing close to prevent falls.  Diagnoses and all orders for this visit:  Late onset Alzheimer's disease with behavioral disturbance Continue medications  Hypothyroidism, unspecified type Recent TSH of 2.9 in 04/2017.  Continue current medicine  Essential hypertension Adequate control, continue current medicine  Permanent atrial fibrillation (HCC) On blood thinner, no recent bleeding  Cardiomyopathy, dilated ([place]) Follows with cardiology  Chronic respiratory failure with hypoxia ([place]) Due to congestive heart failure.  On 4 L of oxygen with sats in the low 90s at best.  Chronic systolic congestive heart failure [facility]) Following with cardiology.  On beta-blocker, statin.  Taking fluid pill as prescribed.  Urinary incontinence, unspecified type Wears depends regularly.  Stage 3 chronic kidney disease (HCC) Stable.  Follow up plan: 2 months, sooner if needed Assunta Found, MD [place]

## 2017-07-27 DIAGNOSIS — M6281 Muscle weakness (generalized): Secondary | ICD-10-CM | POA: Diagnosis not present

## 2017-07-27 DIAGNOSIS — R278 Other lack of coordination: Secondary | ICD-10-CM | POA: Diagnosis not present

## 2017-07-27 DIAGNOSIS — G309 Alzheimer's disease, unspecified: Secondary | ICD-10-CM | POA: Diagnosis not present

## 2017-07-27 DIAGNOSIS — R1311 Dysphagia, oral phase: Secondary | ICD-10-CM | POA: Diagnosis not present

## 2017-07-27 DIAGNOSIS — R41841 Cognitive communication deficit: Secondary | ICD-10-CM | POA: Diagnosis not present

## 2017-07-30 DIAGNOSIS — E039 Hypothyroidism, unspecified: Secondary | ICD-10-CM | POA: Diagnosis not present

## 2017-07-30 DIAGNOSIS — I4891 Unspecified atrial fibrillation: Secondary | ICD-10-CM | POA: Diagnosis not present

## 2017-07-30 DIAGNOSIS — G309 Alzheimer's disease, unspecified: Secondary | ICD-10-CM | POA: Diagnosis not present

## 2017-07-30 DIAGNOSIS — I10 Essential (primary) hypertension: Secondary | ICD-10-CM | POA: Diagnosis not present

## 2017-08-10 ENCOUNTER — Ambulatory Visit: Payer: PPO | Admitting: Family Medicine

## 2017-08-10 DIAGNOSIS — Z79899 Other long term (current) drug therapy: Secondary | ICD-10-CM | POA: Diagnosis not present

## 2017-08-10 DIAGNOSIS — E119 Type 2 diabetes mellitus without complications: Secondary | ICD-10-CM | POA: Diagnosis not present

## 2017-08-10 DIAGNOSIS — D649 Anemia, unspecified: Secondary | ICD-10-CM | POA: Diagnosis not present

## 2017-08-10 DIAGNOSIS — E559 Vitamin D deficiency, unspecified: Secondary | ICD-10-CM | POA: Diagnosis not present

## 2017-08-10 DIAGNOSIS — E039 Hypothyroidism, unspecified: Secondary | ICD-10-CM | POA: Diagnosis not present

## 2017-08-10 DIAGNOSIS — E782 Mixed hyperlipidemia: Secondary | ICD-10-CM | POA: Diagnosis not present

## 2017-08-10 DIAGNOSIS — E7800 Pure hypercholesterolemia, unspecified: Secondary | ICD-10-CM | POA: Diagnosis not present

## 2017-08-10 DIAGNOSIS — D518 Other vitamin B12 deficiency anemias: Secondary | ICD-10-CM | POA: Diagnosis not present

## 2017-08-16 DIAGNOSIS — J9610 Chronic respiratory failure, unspecified whether with hypoxia or hypercapnia: Secondary | ICD-10-CM | POA: Diagnosis not present

## 2017-08-16 DIAGNOSIS — I4891 Unspecified atrial fibrillation: Secondary | ICD-10-CM | POA: Diagnosis not present

## 2017-08-16 DIAGNOSIS — G309 Alzheimer's disease, unspecified: Secondary | ICD-10-CM | POA: Diagnosis not present

## 2017-08-16 DIAGNOSIS — E039 Hypothyroidism, unspecified: Secondary | ICD-10-CM | POA: Diagnosis not present

## 2017-08-17 DIAGNOSIS — R0989 Other specified symptoms and signs involving the circulatory and respiratory systems: Secondary | ICD-10-CM | POA: Diagnosis not present

## 2017-08-17 DIAGNOSIS — R05 Cough: Secondary | ICD-10-CM | POA: Diagnosis not present

## 2017-08-24 ENCOUNTER — Ambulatory Visit (INDEPENDENT_AMBULATORY_CARE_PROVIDER_SITE_OTHER): Payer: PPO | Admitting: *Deleted

## 2017-08-24 DIAGNOSIS — M109 Gout, unspecified: Secondary | ICD-10-CM | POA: Diagnosis not present

## 2017-08-24 DIAGNOSIS — R609 Edema, unspecified: Secondary | ICD-10-CM | POA: Diagnosis not present

## 2017-08-24 DIAGNOSIS — Z79899 Other long term (current) drug therapy: Secondary | ICD-10-CM | POA: Diagnosis not present

## 2017-08-24 DIAGNOSIS — I442 Atrioventricular block, complete: Secondary | ICD-10-CM | POA: Diagnosis not present

## 2017-08-24 DIAGNOSIS — D649 Anemia, unspecified: Secondary | ICD-10-CM | POA: Diagnosis not present

## 2017-08-24 NOTE — Progress Notes (Signed)
Remote pacemaker transmission.   

## 2017-08-25 ENCOUNTER — Encounter: Payer: Self-pay | Admitting: Cardiology

## 2017-08-26 DIAGNOSIS — I5022 Chronic systolic (congestive) heart failure: Secondary | ICD-10-CM | POA: Diagnosis not present

## 2017-08-26 DIAGNOSIS — M10049 Idiopathic gout, unspecified hand: Secondary | ICD-10-CM | POA: Diagnosis not present

## 2017-08-26 DIAGNOSIS — M79643 Pain in unspecified hand: Secondary | ICD-10-CM | POA: Diagnosis not present

## 2017-08-26 DIAGNOSIS — N183 Chronic kidney disease, stage 3 (moderate): Secondary | ICD-10-CM | POA: Diagnosis not present

## 2017-08-27 DIAGNOSIS — R278 Other lack of coordination: Secondary | ICD-10-CM | POA: Diagnosis not present

## 2017-08-27 DIAGNOSIS — M6281 Muscle weakness (generalized): Secondary | ICD-10-CM | POA: Diagnosis not present

## 2017-08-27 DIAGNOSIS — R41841 Cognitive communication deficit: Secondary | ICD-10-CM | POA: Diagnosis not present

## 2017-08-27 DIAGNOSIS — G309 Alzheimer's disease, unspecified: Secondary | ICD-10-CM | POA: Diagnosis not present

## 2017-08-27 DIAGNOSIS — R1311 Dysphagia, oral phase: Secondary | ICD-10-CM | POA: Diagnosis not present

## 2017-08-28 ENCOUNTER — Encounter: Payer: Self-pay | Admitting: Internal Medicine

## 2017-08-28 ENCOUNTER — Ambulatory Visit: Payer: PPO | Admitting: Internal Medicine

## 2017-08-28 VITALS — BP 118/78 | HR 73 | Ht <= 58 in | Wt 161.0 lb

## 2017-08-28 DIAGNOSIS — I442 Atrioventricular block, complete: Secondary | ICD-10-CM | POA: Diagnosis not present

## 2017-08-28 DIAGNOSIS — I4821 Permanent atrial fibrillation: Secondary | ICD-10-CM

## 2017-08-28 DIAGNOSIS — I482 Chronic atrial fibrillation: Secondary | ICD-10-CM | POA: Diagnosis not present

## 2017-08-28 DIAGNOSIS — Z950 Presence of cardiac pacemaker: Secondary | ICD-10-CM | POA: Diagnosis not present

## 2017-08-28 NOTE — Progress Notes (Signed)
PCP: Hilbert Corrigan, MD Primary Cardiologist: Dr Harl Bowie Primary EP:  Dr Sherrian Divers [name] is a 84 y.o. female who presents today for routine electrophysiology followup.  Since last being seen in our clinic, the patient reports doing very well. She has moved to the NH and per her daughters is really enjoying this.  SOB is stable.  + dependant edema.  Today, she denies symptoms of palpitations, chest pain,  dizziness, presyncope, or syncope.  The patient is otherwise without complaint today.   Past Medical History:  Diagnosis Date  . Chronic lung disease    on home O2   . Complete heart block (HCC)    s/p PPM by Dr Rayann Heman  . Debilitated   . Diastolic dysfunction   . Hypertension   . Lymphedema of right lower extremity   . MI (mitral incompetence)   . Obesity   . Permanent atrial fibrillation ([place])   . Pulmonary hypertension ([place])   . Rheumatoid arthritis(714.0)   . Sleep apnea   . Stroke [facility])    remote  . Venous insufficiency    with chronic leg ulcers   Past Surgical History:  Procedure Laterality Date  . PACEMAKER INSERTION  08/13/10   SJM by Dr Rayann Heman    ROS- all systems are reviewed and negative except as per HPI above  Current Outpatient Medications  Medication Sig Dispense Refill  . donepezil (ARICEPT) 10 MG tablet Take 1 tablet (10 mg total) by mouth at bedtime. 90 tablet 1  . furosemide (LASIX) 40 MG tablet TAKE 1 TABLET EVERY MORNING AND TAKE 1/2 TABLET AT LUNCH 135 tablet 1  . hydrocortisone (PROCTOZONE-HC) 2.5 % rectal cream Place 1 application rectally 2 (two) times daily. 30 g 0  . levothyroxine (SYNTHROID, LEVOTHROID) 50 MCG tablet TAKE ONE (1) TABLET EACH DAY 90 tablet 2  . memantine (NAMENDA) 10 MG tablet Take 1 tablet (10 mg total) by mouth 2 (two) times daily. 180 tablet 1  . Menthol-Zinc Oxide (CALMOSEPTINE) 0.44-20.6 % OINT Apply topically every 8 (eight) hours as needed (apply to buttock).    . metoprolol succinate (TOPROL-XL) 25 MG  24 hr tablet TAKE 1/2 TABLET DAILY 45 tablet 1  . MISC NATURAL PRODUCTS EX Apply topically. eucerin cream as needed to legs for dry skin    . Multiple Vitamins-Minerals (MULTIVITAMIN WITH MINERALS) tablet Take 1 tablet by mouth daily.    . OXYGEN-HELIUM IN Inhale 4 L into the lungs continuous. 4L    . predniSONE (DELTASONE) 50 MG tablet Take 50 mg by mouth daily with breakfast. X 5 days, beginning 08/24/2017    . QUEtiapine (SEROQUEL) 25 MG tablet TAKE ONE TABLET DAILY AT BEDTIME 90 tablet 0  . Rivaroxaban (XARELTO) 15 MG TABS tablet Take 1 tablet (15 mg total) by mouth daily with supper. 28 tablet 0  . simvastatin (ZOCOR) 20 MG tablet TAKE ONE (1) TABLET EACH DAY 90 tablet 1  . traMADol (ULTRAM) 50 MG tablet Take 1 tablet (50 mg total) by mouth 3 (three) times daily as needed for moderate pain. 60 tablet 3   No current facility-administered medications for this visit.     Physical Exam: Vitals:   08/28/17 1014  BP: 118/78  Pulse: 73  SpO2: 97%  Weight: 161 lb (73 kg)  Height: 4\' 9"  (1.448 m)    GEN- The patient is well appearing, alert and oriented x 3 today.   Head- normocephalic, atraumatic Eyes-  Sclera clear, conjunctiva pink Ears-  hearing intact Oropharynx- clear Lungs- Clear to ausculation bilaterally, normal work of breathing Chest- pacemaker pocket is well healed Heart- Regular rate and rhythm, no murmurs, rubs or gallops, PMI not laterally displaced GI- soft, NT, ND, + BS Extremities- no clubbing, cyanosis, + venous stasis with edema  Pacemaker interrogation- reviewed in detail today,  See PACEART report   Assessment and Plan:  1. Symptomatic complete heart block Normal pacemaker function See Pace Art report No changes today  2. Permanent afib Anticoagulated chads2vasc score is 5  3. Valvular heart disease Followed by Dr Harl Bowie  4. Venous insufficiency Support hose advised 2 gram sodium diet   Merlin Return in a year  Thompson Grayer MD,  [facility] 08/28/2017 10:58 AM

## 2017-08-28 NOTE — Patient Instructions (Signed)
Medication Instructions:  Continue all current medications.  Labwork: none  Testing/Procedures: none  Follow-Up: 1 year   Any Other Special Instructions Will Be Listed Below (If Applicable).  Remote monitoring is used to monitor your Pacemaker of ICD from home. This monitoring reduces the number of office visits required to check your device to one time per year. It allows [place] to keep an eye on the functioning of your device to ensure it is working properly. You are scheduled for a device check from home on 11/23/2017. You may send your transmission at any time that day. If you have a wireless device, the transmission will be sent automatically. After your physician reviews your transmission, you will receive a postcard with your next transmission date.  2 gm low sodium diet info given today.   If you need a refill on your cardiac medications before your next appointment, please call your pharmacy.

## 2017-09-08 DIAGNOSIS — J9610 Chronic respiratory failure, unspecified whether with hypoxia or hypercapnia: Secondary | ICD-10-CM | POA: Diagnosis not present

## 2017-09-08 DIAGNOSIS — Z950 Presence of cardiac pacemaker: Secondary | ICD-10-CM | POA: Diagnosis not present

## 2017-09-08 DIAGNOSIS — E039 Hypothyroidism, unspecified: Secondary | ICD-10-CM | POA: Diagnosis not present

## 2017-09-08 DIAGNOSIS — I4891 Unspecified atrial fibrillation: Secondary | ICD-10-CM | POA: Diagnosis not present

## 2017-09-20 LAB — CUP PACEART INCLINIC DEVICE CHECK
Battery Remaining Longevity: 120 mo
Battery Voltage: 2.95 V
Date Time Interrogation Session: 20190802143301
Implantable Lead Implant Date: 20120717
Implantable Lead Location: 753860
Implantable Pulse Generator Implant Date: 20120717
Lead Channel Pacing Threshold Amplitude: 0.5 V
Lead Channel Pacing Threshold Pulse Width: 0.4 ms
Lead Channel Setting Pacing Amplitude: 2.5 V
MDC IDC MSMT LEADCHNL RV IMPEDANCE VALUE: 637.5 Ohm
MDC IDC MSMT LEADCHNL RV SENSING INTR AMPL: 6.9 mV
MDC IDC PG SERIAL: 7245638
MDC IDC SET LEADCHNL RV PACING PULSEWIDTH: 0.4 ms
MDC IDC SET LEADCHNL RV SENSING SENSITIVITY: 2.5 mV
MDC IDC STAT BRADY RV PERCENT PACED: 46 %
Pulse Gen Model: 1210

## 2017-09-23 DIAGNOSIS — D649 Anemia, unspecified: Secondary | ICD-10-CM | POA: Diagnosis not present

## 2017-09-26 LAB — CUP PACEART REMOTE DEVICE CHECK
Battery Remaining Longevity: 121 mo
Brady Statistic RV Percent Paced: 46 %
Date Time Interrogation Session: 20190729060803
Implantable Lead Implant Date: 20120717
Implantable Lead Location: 753860
Lead Channel Pacing Threshold Amplitude: 0.75 V
Lead Channel Sensing Intrinsic Amplitude: 6 mV
MDC IDC MSMT BATTERY REMAINING PERCENTAGE: 91 %
MDC IDC MSMT BATTERY VOLTAGE: 2.95 V
MDC IDC MSMT LEADCHNL RV IMPEDANCE VALUE: 580 Ohm
MDC IDC MSMT LEADCHNL RV PACING THRESHOLD PULSEWIDTH: 0.4 ms
MDC IDC PG IMPLANT DT: 20120717
MDC IDC SET LEADCHNL RV PACING AMPLITUDE: 2.5 V
MDC IDC SET LEADCHNL RV PACING PULSEWIDTH: 0.4 ms
MDC IDC SET LEADCHNL RV SENSING SENSITIVITY: 2.5 mV
Pulse Gen Serial Number: 7245638

## 2017-09-28 DIAGNOSIS — G309 Alzheimer's disease, unspecified: Secondary | ICD-10-CM | POA: Diagnosis not present

## 2017-09-28 DIAGNOSIS — M6281 Muscle weakness (generalized): Secondary | ICD-10-CM | POA: Diagnosis not present

## 2017-09-29 IMAGING — CR DG CHEST 2V
2 series · 2 of 2 positions shown · non-contrast
Comparison: 02/09/2015

CLINICAL DATA: Dyspnea.

EXAM:
CHEST  2 VIEW

[view not recorded (1 of 2)]
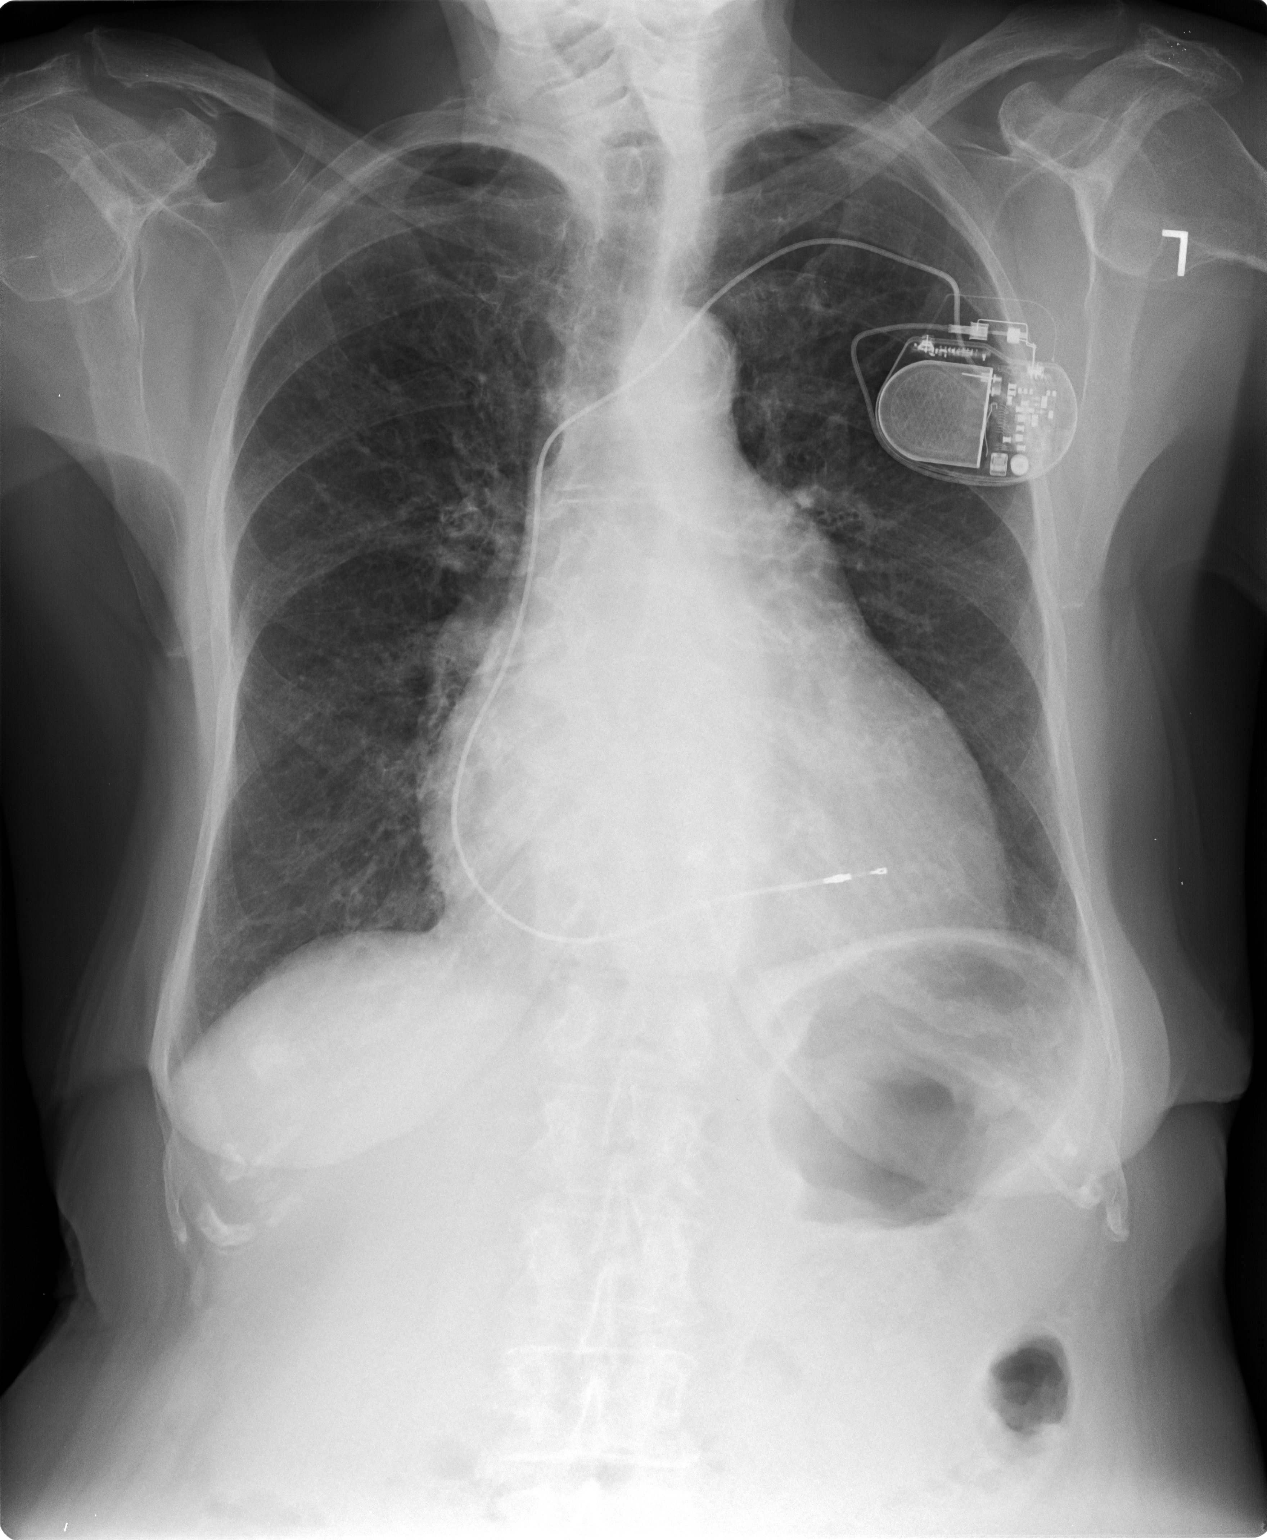

[view not recorded (2 of 2)]
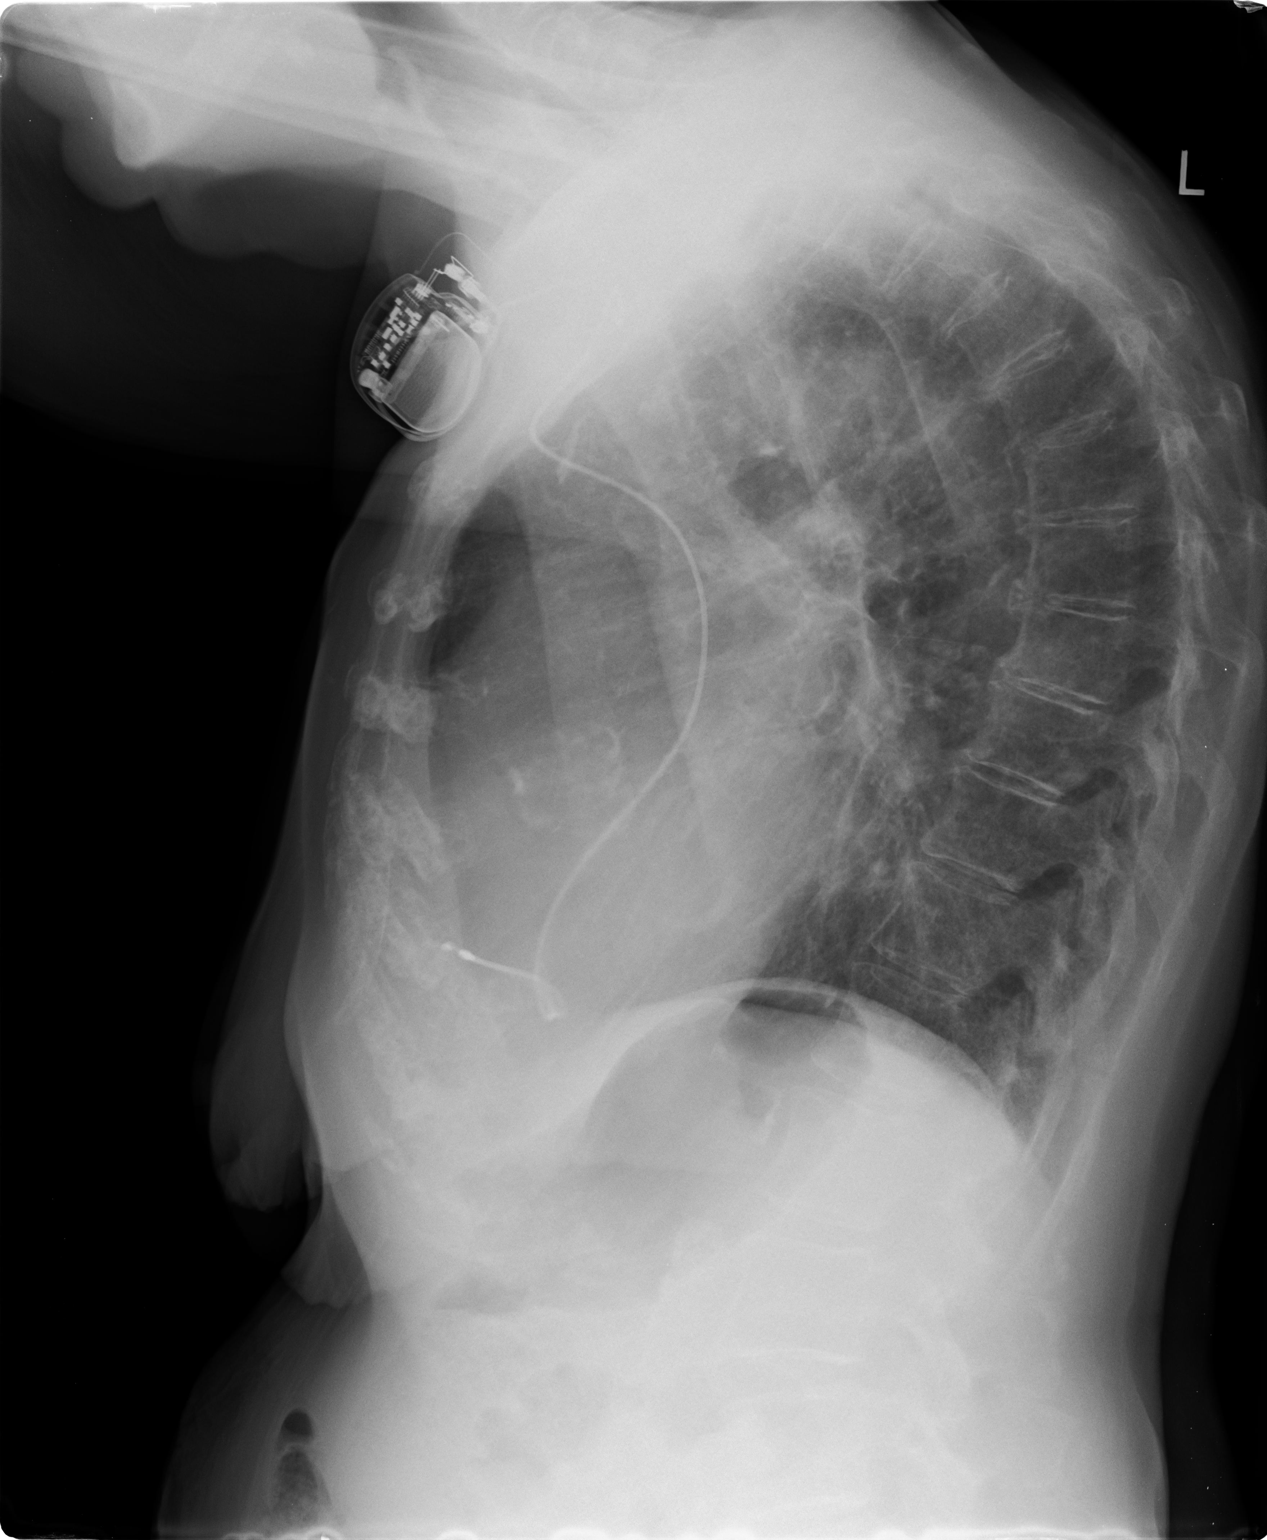

[2 of 2 positions shown; findings below may reference images not displayed]

FINDINGS: Single lead cardiac pacemaker is stable.

The cardiac silhouette is enlarged. Mediastinal contours appear
intact. There is prominence of the main pulmonary artery.
Atherosclerotic disease of the aorta is noted. Aortic valve annular
calcifications also seen.

There is no evidence of focal airspace consolidation, pleural
effusion or pneumothorax. Mild pulmonary vascular congestion may be
present.

Osseous structures are without acute abnormality. Soft tissues are
grossly normal.
IMPRESSION: Enlarged cardiac silhouette with mild pulmonary vascular congestion.

Enlarged contour of the main pulmonary artery, which may be seen
with pulmonary arterial hypertension.

## 2017-09-30 DIAGNOSIS — J020 Streptococcal pharyngitis: Secondary | ICD-10-CM | POA: Diagnosis not present

## 2017-09-30 DIAGNOSIS — I10 Essential (primary) hypertension: Secondary | ICD-10-CM | POA: Diagnosis not present

## 2017-09-30 DIAGNOSIS — E039 Hypothyroidism, unspecified: Secondary | ICD-10-CM | POA: Diagnosis not present

## 2017-09-30 DIAGNOSIS — I4891 Unspecified atrial fibrillation: Secondary | ICD-10-CM | POA: Diagnosis not present

## 2017-10-11 DIAGNOSIS — I517 Cardiomegaly: Secondary | ICD-10-CM | POA: Diagnosis not present

## 2017-10-11 DIAGNOSIS — J810 Acute pulmonary edema: Secondary | ICD-10-CM | POA: Diagnosis not present

## 2017-10-18 DIAGNOSIS — I509 Heart failure, unspecified: Secondary | ICD-10-CM | POA: Diagnosis not present

## 2017-10-19 DIAGNOSIS — Z79899 Other long term (current) drug therapy: Secondary | ICD-10-CM | POA: Diagnosis not present

## 2017-10-21 ENCOUNTER — Encounter: Payer: Self-pay | Admitting: *Deleted

## 2017-10-21 ENCOUNTER — Encounter: Payer: Self-pay | Admitting: Cardiology

## 2017-10-21 ENCOUNTER — Ambulatory Visit: Payer: PPO | Admitting: Cardiology

## 2017-10-21 VITALS — BP 130/89 | HR 72 | Ht 59.0 in | Wt 159.6 lb

## 2017-10-21 DIAGNOSIS — I5033 Acute on chronic diastolic (congestive) heart failure: Secondary | ICD-10-CM | POA: Diagnosis not present

## 2017-10-21 DIAGNOSIS — I272 Pulmonary hypertension, unspecified: Secondary | ICD-10-CM

## 2017-10-21 DIAGNOSIS — I442 Atrioventricular block, complete: Secondary | ICD-10-CM | POA: Diagnosis not present

## 2017-10-21 DIAGNOSIS — E039 Hypothyroidism, unspecified: Secondary | ICD-10-CM | POA: Diagnosis not present

## 2017-10-21 DIAGNOSIS — J9610 Chronic respiratory failure, unspecified whether with hypoxia or hypercapnia: Secondary | ICD-10-CM | POA: Diagnosis not present

## 2017-10-21 DIAGNOSIS — I4891 Unspecified atrial fibrillation: Secondary | ICD-10-CM

## 2017-10-21 DIAGNOSIS — I5022 Chronic systolic (congestive) heart failure: Secondary | ICD-10-CM | POA: Diagnosis not present

## 2017-10-21 NOTE — Patient Instructions (Signed)

## 2017-10-21 NOTE — Progress Notes (Signed)
Clinical Summary Ms. Schader is a 84 y.o.female seen today for follow up of the following medical problems.   1. Heart block - pacemaker followed by Dr Rayann Heman - no recent symptoms, device check scheduled for next month    2. PAF - no recent palpitations. No bleeding on xarelto.   3. Valvular heart disease - 09/2016 echo mild to mod AI, mild AS, mod MR, mod TR.  - some recent leg swelling and SOB. Started on IV lasix at nursing home.    4. Low normal to mildly decreased LV function - 09/2016 echo LVEF 45-50%. Limited visualization, could not eval diastolic function. Severe biatrail enlargement suggests significant diastolic dysfunction  - some recent SOB, leg swelling. Diagnosed with pneumonia at her nursing home. Started on IV ceftraixone as well as IV lasix there. SYmptoms are improving.   6. Pulmonary HTN - PASP 65 by last echo - suspected related to left sided heart disease with evidence of advanced diastolic dysfunction, as well as chronic lung disease with O2 dependent COPD.  7. COPD - on home O2  8. Pneumonioa - on IV ceftriaxone started yesterday at nursing home  Past Medical History:  Diagnosis Date  . Chronic lung disease    on home O2   . Complete heart block (HCC)    s/p PPM by Dr Rayann Heman  . Debilitated   . Diastolic dysfunction   . Hypertension   . Lymphedema of right lower extremity   . MI (mitral incompetence)   . Obesity   . Permanent atrial fibrillation ([place])   . Pulmonary hypertension ([place])   . Rheumatoid arthritis(714.0)   . Sleep apnea   . Stroke [facility])    remote  . Venous insufficiency    with chronic leg ulcers     No Known Allergies   Current Outpatient Medications  Medication Sig Dispense Refill  . donepezil (ARICEPT) 10 MG tablet Take 1 tablet (10 mg total) by mouth at bedtime. 90 tablet 1  . furosemide (LASIX) 40 MG tablet TAKE 1 TABLET EVERY MORNING AND TAKE 1/2 TABLET AT LUNCH 135 tablet 1  . hydrocortisone  (PROCTOZONE-HC) 2.5 % rectal cream Place 1 application rectally 2 (two) times daily. 30 g 0  . levothyroxine (SYNTHROID, LEVOTHROID) 50 MCG tablet TAKE ONE (1) TABLET EACH DAY 90 tablet 2  . memantine (NAMENDA) 10 MG tablet Take 1 tablet (10 mg total) by mouth 2 (two) times daily. 180 tablet 1  . Menthol-Zinc Oxide (CALMOSEPTINE) 0.44-20.6 % OINT Apply topically every 8 (eight) hours as needed (apply to buttock).    . metoprolol succinate (TOPROL-XL) 25 MG 24 hr tablet TAKE 1/2 TABLET DAILY 45 tablet 1  . MISC NATURAL PRODUCTS EX Apply topically. eucerin cream as needed to legs for dry skin    . Multiple Vitamins-Minerals (MULTIVITAMIN WITH MINERALS) tablet Take 1 tablet by mouth daily.    . OXYGEN-HELIUM IN Inhale 4 L into the lungs continuous. 4L    . predniSONE (DELTASONE) 50 MG tablet Take 50 mg by mouth daily with breakfast. X 5 days, beginning 08/24/2017    . QUEtiapine (SEROQUEL) 25 MG tablet TAKE ONE TABLET DAILY AT BEDTIME 90 tablet 0  . Rivaroxaban (XARELTO) 15 MG TABS tablet Take 1 tablet (15 mg total) by mouth daily with supper. 28 tablet 0  . simvastatin (ZOCOR) 20 MG tablet TAKE ONE (1) TABLET EACH DAY 90 tablet 1  . traMADol (ULTRAM) 50 MG tablet Take 1 tablet (50 mg total) by  mouth 3 (three) times daily as needed for moderate pain. 60 tablet 3   No current facility-administered medications for this visit.      Past Surgical History:  Procedure Laterality Date  . PACEMAKER INSERTION  08/13/10   SJM by Dr Rayann Heman     No Known Allergies    Family History  Problem Relation Age of Onset  . Colon cancer Mother   . Alzheimer's disease Mother   . Congestive Heart Failure Father      Social History Ms. Corriveau reports that she has never smoked. She has never used smokeless tobacco. Ms. Neace reports that she does not drink alcohol.   Review of Systems CONSTITUTIONAL: No weight loss, fever, chills, weakness or fatigue.  HEENT: Eyes: No visual loss, blurred  vision, double vision or yellow sclerae.No hearing loss, sneezing, congestion, runny nose or sore throat.  SKIN: No rash or itching.  CARDIOVASCULAR: per hpi RESPIRATORY: No shortness of breath, cough or sputum.  GASTROINTESTINAL: No anorexia, nausea, vomiting or diarrhea. No abdominal pain or blood.  GENITOURINARY: No burning on urination, no polyuria NEUROLOGICAL: No headache, dizziness, syncope, paralysis, ataxia, numbness or tingling in the extremities. No change in bowel or bladder control.  MUSCULOSKELETAL: No muscle, back pain, joint pain or stiffness.  LYMPHATICS: No enlarged nodes. No history of splenectomy.  PSYCHIATRIC: No history of depression or anxiety.  ENDOCRINOLOGIC: No reports of sweating, cold or heat intolerance. No polyuria or polydipsia.  Marland Kitchen   Physical Examination Vitals:   10/21/17 1048  BP: 130/89  Pulse: 72  SpO2: 100%   Vitals:   10/21/17 1048  Weight: 159 lb 9.6 oz (72.4 kg)  Height: 4\' 11"  (1.499 m)    Gen: resting comfortably, no acute distress HEENT: no scleral icterus, pupils equal round and reactive, no palptable cervical adenopathy,  CV: irreg, no m/r/g, no jvd Resp: Clear to auscultation bilaterally GI: abdomen is soft, non-tender, non-distended, normal bowel sounds, no hepatosplenomegaly MSK: extremities are warm, no edema.  Skin: warm, no rash Neuro:  no focal deficits Psych: appropriate affect   Diagnostic Studies 04/2014 Echo Study Conclusions  - Left ventricle: The cavity size was normal. Wall thickness was normal. Systolic function was normal. The estimated ejection fraction was in the range of 55% to 60%. Abnormal diastolic function, indeterminate grade. Wall motion was normal; there were no regional wall motion abnormalities. - Aortic valve: Mildly calcified annulus. Trileaflet; mildly thickened leaflets. There was mild stenosis. There was mild to moderate regurgitation. Mean gradient (S): 13 mm Hg. Valve  area (VTI): 1.56 cm^2. Valve area (Vmax): 1.45 cm^2. Valve area (Vmean): 1.34 cm^2. Regurgitation pressure half-time: 542 ms. - Mitral valve: Mildly to moderately calcified annulus. Mildly thickened leaflets . There was moderate regurgitation. The MR VC is 0.5 cm. - Left atrium: The atrium was severely dilated. - Right ventricle: The cavity size was mildly dilated. - Right atrium: The atrium was severely dilated. - Tricuspid valve: There are 2 seperate TR jets. TR VC for jet 1 is 4.0 cm, the TR VC for jet 2 is 0.3 cm. The composite of the jets is moderate to severe TR. - Pulmonary arteries: Systolic pressure was moderately increased. PA peak pressure: 57 mm Hg (S). - Inferior vena cava: The vessel was normal in size. The respirophasic diameter changes were in the normal range (>= 50%), consistent with normal central venous pressure. - Technically adequate study.   09/2016 echo Study Conclusions  - Left ventricle: The cavity size was normal. Wall thickness  was normal. Diastolic dysfunction, grade indeterminate. Systolic function was mildly reduced. The estimated ejection fraction was in the range of 45% to 50%. Diffuse hypokinesis. Doppler parameters are consistent with high ventricular filling pressure. - Regional wall motion abnormality: Mild hypokinesis of the apical myocardium. - Ventricular septum: Septal motion showed abnormal function and dyssynergy. These changes are consistent with right ventricular pacing. - Aortic valve: Moderately calcified annulus. Mildly thickened leaflets. There was mild stenosis. There was mild to moderate regurgitation. Peak velocity (S): 225 cm/s. Mean gradient (S): 11 mm Hg. - Mitral valve: Moderately to severely calcified annulus. Mildly thickened leaflets . There was moderate eccentric regurgitation. - Left atrium: The atrium was severely dilated. - Right ventricle: The cavity size was mildly  dilated. Pacer wire or catheter noted in right ventricle. Systolic function was mildly reduced. - Right atrium: The atrium was severely dilated. - Tricuspid valve: There was moderate regurgitation. - Pulmonic valve: There was mild regurgitation. - Pulmonary arteries: Systolic pressure was moderately to severely increased. PA peak pressure: 65 mm Hg (S).  Impressions:  - Systolic function was difficult to assess in all views, but overall appeared mildly reduced, LVEF 45-50%. Consider a repeat limited study with contrast for a more accurate assessment of LVEF and regional wall motion.     Assessment and Plan  1. Heart block - pacemaker management per Dr Rayann Heman -f/u with device clinic next month  2. Valvular heart disease/Acute on chronic diastolic HF - overall mild to moderate valve disease, continue to monitor - recent fluid overload in setting of pneumonia. On IV lasix 40mg  bid x 3 days at nursing home, agree with treatment, would hold her oral lasix while on IV therapy.    3.Afib - CHADS2vasc score is 3, continueanticoag - doing well, continue current meds  4. Pulm HTN - no symptoms, continue current medical therapy. No indication for pulmonary vasodilators, primarily left sided heart disease and COPD as etiology - continue to monitor   F/u 4 months       Arnoldo Lenis, M.D.

## 2017-10-22 DIAGNOSIS — A498 Other bacterial infections of unspecified site: Secondary | ICD-10-CM | POA: Diagnosis not present

## 2017-10-22 DIAGNOSIS — Z79899 Other long term (current) drug therapy: Secondary | ICD-10-CM | POA: Diagnosis not present

## 2017-10-23 DIAGNOSIS — I130 Hypertensive heart and chronic kidney disease with heart failure and stage 1 through stage 4 chronic kidney disease, or unspecified chronic kidney disease: Secondary | ICD-10-CM | POA: Diagnosis not present

## 2017-10-23 DIAGNOSIS — J9610 Chronic respiratory failure, unspecified whether with hypoxia or hypercapnia: Secondary | ICD-10-CM | POA: Diagnosis not present

## 2017-10-23 DIAGNOSIS — N183 Chronic kidney disease, stage 3 (moderate): Secondary | ICD-10-CM | POA: Diagnosis not present

## 2017-10-23 DIAGNOSIS — I5022 Chronic systolic (congestive) heart failure: Secondary | ICD-10-CM | POA: Diagnosis not present

## 2017-10-27 DIAGNOSIS — G309 Alzheimer's disease, unspecified: Secondary | ICD-10-CM | POA: Diagnosis not present

## 2017-10-27 DIAGNOSIS — M6281 Muscle weakness (generalized): Secondary | ICD-10-CM | POA: Diagnosis not present

## 2017-10-28 DIAGNOSIS — Z79899 Other long term (current) drug therapy: Secondary | ICD-10-CM | POA: Diagnosis not present

## 2017-11-03 DIAGNOSIS — E039 Hypothyroidism, unspecified: Secondary | ICD-10-CM | POA: Diagnosis not present

## 2017-11-03 DIAGNOSIS — Z950 Presence of cardiac pacemaker: Secondary | ICD-10-CM | POA: Diagnosis not present

## 2017-11-03 DIAGNOSIS — I4891 Unspecified atrial fibrillation: Secondary | ICD-10-CM | POA: Diagnosis not present

## 2017-11-03 DIAGNOSIS — J9610 Chronic respiratory failure, unspecified whether with hypoxia or hypercapnia: Secondary | ICD-10-CM | POA: Diagnosis not present

## 2017-11-13 DIAGNOSIS — M6281 Muscle weakness (generalized): Secondary | ICD-10-CM | POA: Diagnosis not present

## 2017-11-13 DIAGNOSIS — G309 Alzheimer's disease, unspecified: Secondary | ICD-10-CM | POA: Diagnosis not present

## 2017-11-23 ENCOUNTER — Ambulatory Visit (INDEPENDENT_AMBULATORY_CARE_PROVIDER_SITE_OTHER): Payer: PPO | Admitting: *Deleted

## 2017-11-23 DIAGNOSIS — I442 Atrioventricular block, complete: Secondary | ICD-10-CM

## 2017-11-23 DIAGNOSIS — I4891 Unspecified atrial fibrillation: Secondary | ICD-10-CM

## 2017-11-24 NOTE — Progress Notes (Signed)
Remote pacemaker transmission.   

## 2017-12-21 DIAGNOSIS — F0151 Vascular dementia with behavioral disturbance: Secondary | ICD-10-CM | POA: Diagnosis not present

## 2017-12-21 DIAGNOSIS — F062 Psychotic disorder with delusions due to known physiological condition: Secondary | ICD-10-CM | POA: Diagnosis not present

## 2017-12-26 DIAGNOSIS — F062 Psychotic disorder with delusions due to known physiological condition: Secondary | ICD-10-CM | POA: Diagnosis not present

## 2017-12-26 DIAGNOSIS — F0151 Vascular dementia with behavioral disturbance: Secondary | ICD-10-CM | POA: Diagnosis not present

## 2017-12-27 DIAGNOSIS — I5022 Chronic systolic (congestive) heart failure: Secondary | ICD-10-CM | POA: Diagnosis not present

## 2017-12-27 DIAGNOSIS — I10 Essential (primary) hypertension: Secondary | ICD-10-CM | POA: Diagnosis not present

## 2017-12-27 DIAGNOSIS — I130 Hypertensive heart and chronic kidney disease with heart failure and stage 1 through stage 4 chronic kidney disease, or unspecified chronic kidney disease: Secondary | ICD-10-CM | POA: Diagnosis not present

## 2017-12-27 DIAGNOSIS — N183 Chronic kidney disease, stage 3 (moderate): Secondary | ICD-10-CM | POA: Diagnosis not present

## 2017-12-28 DIAGNOSIS — F0151 Vascular dementia with behavioral disturbance: Secondary | ICD-10-CM | POA: Diagnosis not present

## 2018-01-11 DIAGNOSIS — N183 Chronic kidney disease, stage 3 (moderate): Secondary | ICD-10-CM | POA: Diagnosis not present

## 2018-01-11 DIAGNOSIS — I5022 Chronic systolic (congestive) heart failure: Secondary | ICD-10-CM | POA: Diagnosis not present

## 2018-01-11 DIAGNOSIS — I10 Essential (primary) hypertension: Secondary | ICD-10-CM | POA: Diagnosis not present

## 2018-01-11 DIAGNOSIS — I130 Hypertensive heart and chronic kidney disease with heart failure and stage 1 through stage 4 chronic kidney disease, or unspecified chronic kidney disease: Secondary | ICD-10-CM | POA: Diagnosis not present

## 2018-01-14 IMAGING — CR DG HAND COMPLETE 3+V*R*
3 series · 3 of 3 positions shown · non-contrast
Comparison: None.

CLINICAL DATA: Recent fall, right hand pain

EXAM:
RIGHT HAND - COMPLETE 3+ VIEW

[view not recorded (1 of 3)]
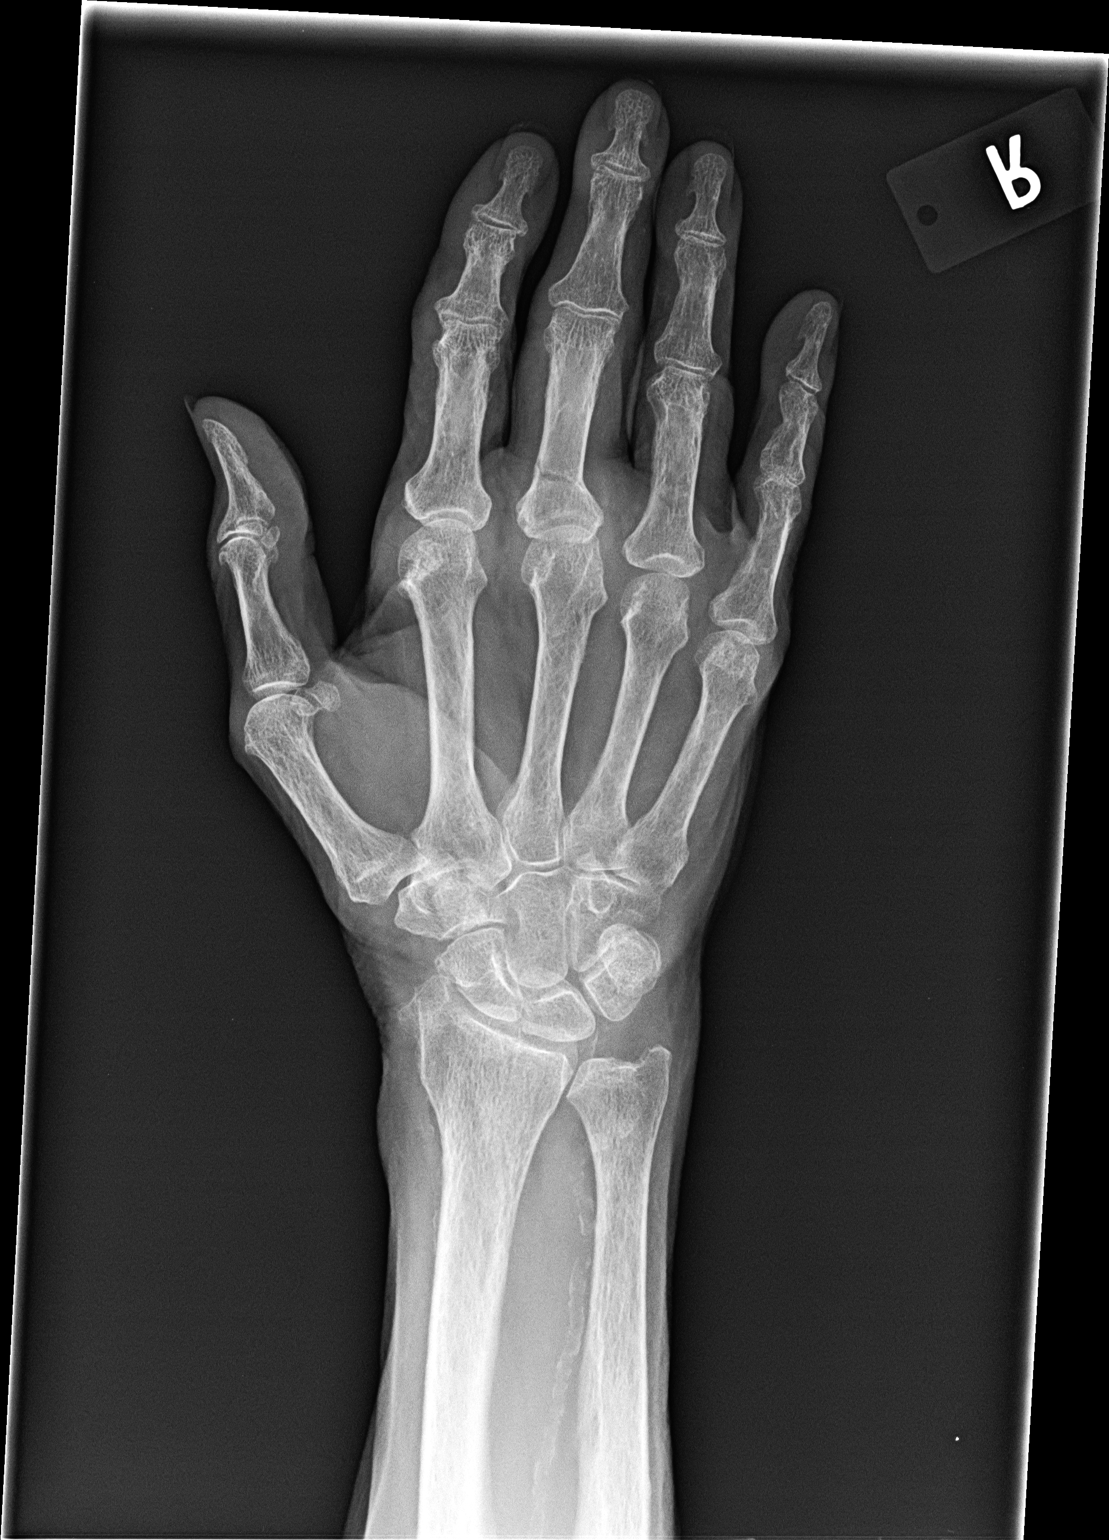

[view not recorded (2 of 3)]
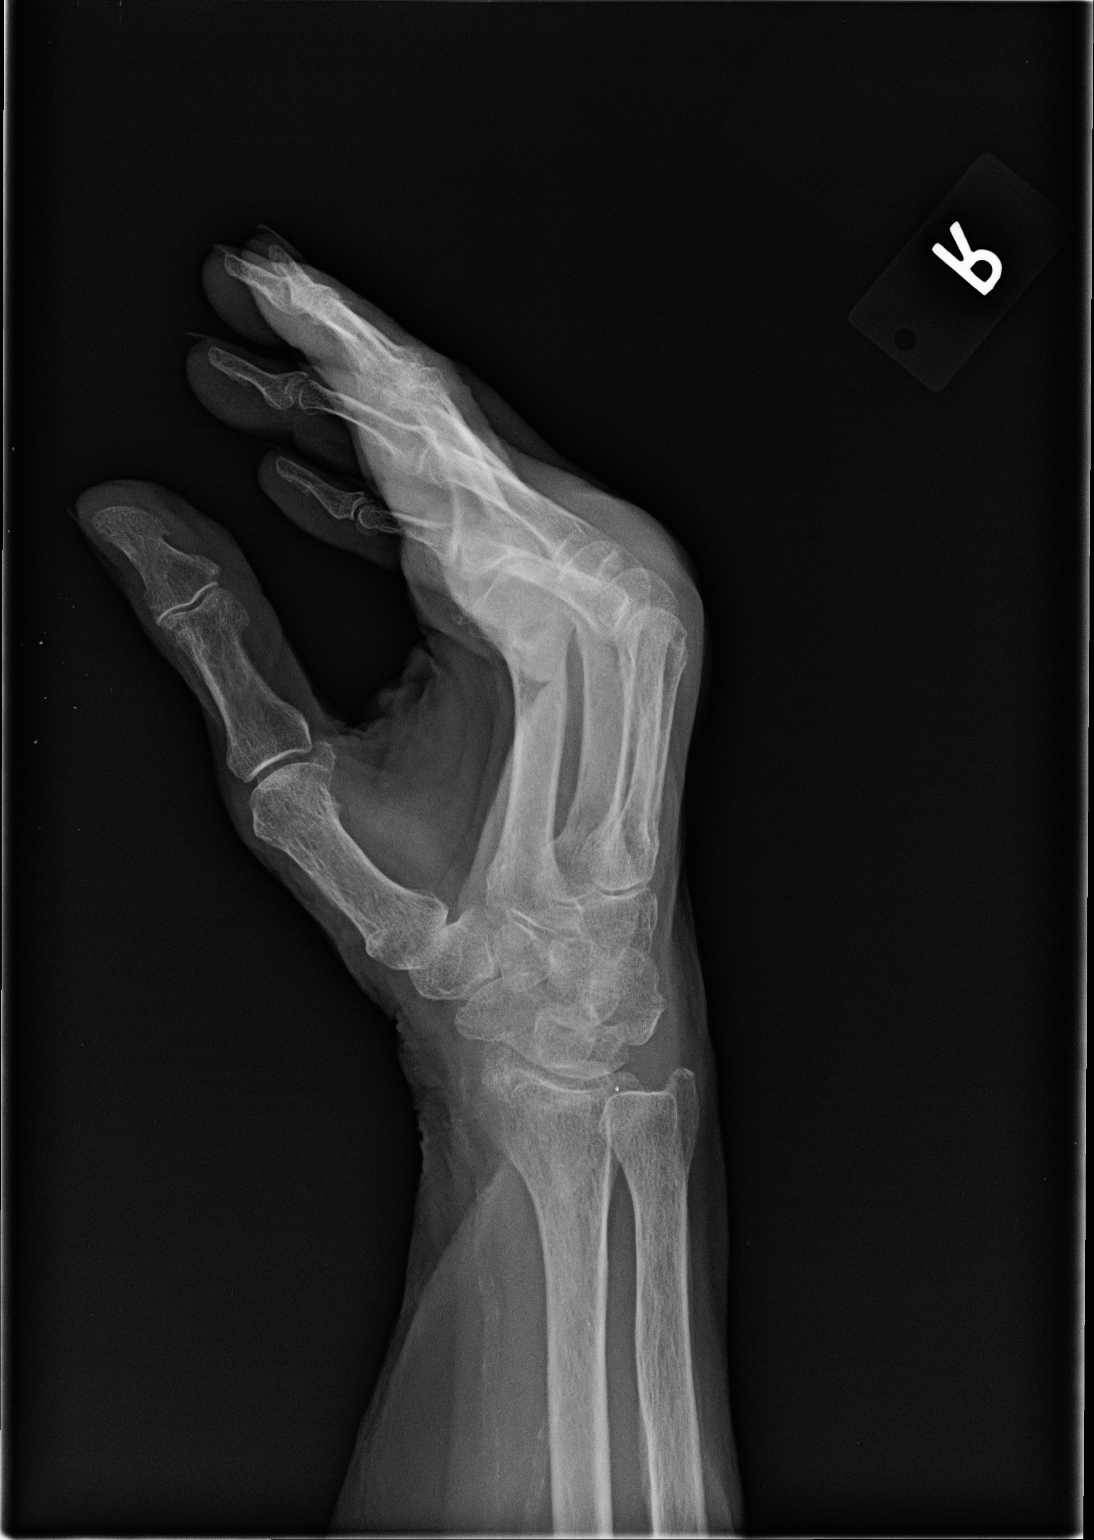

[view not recorded (3 of 3)]
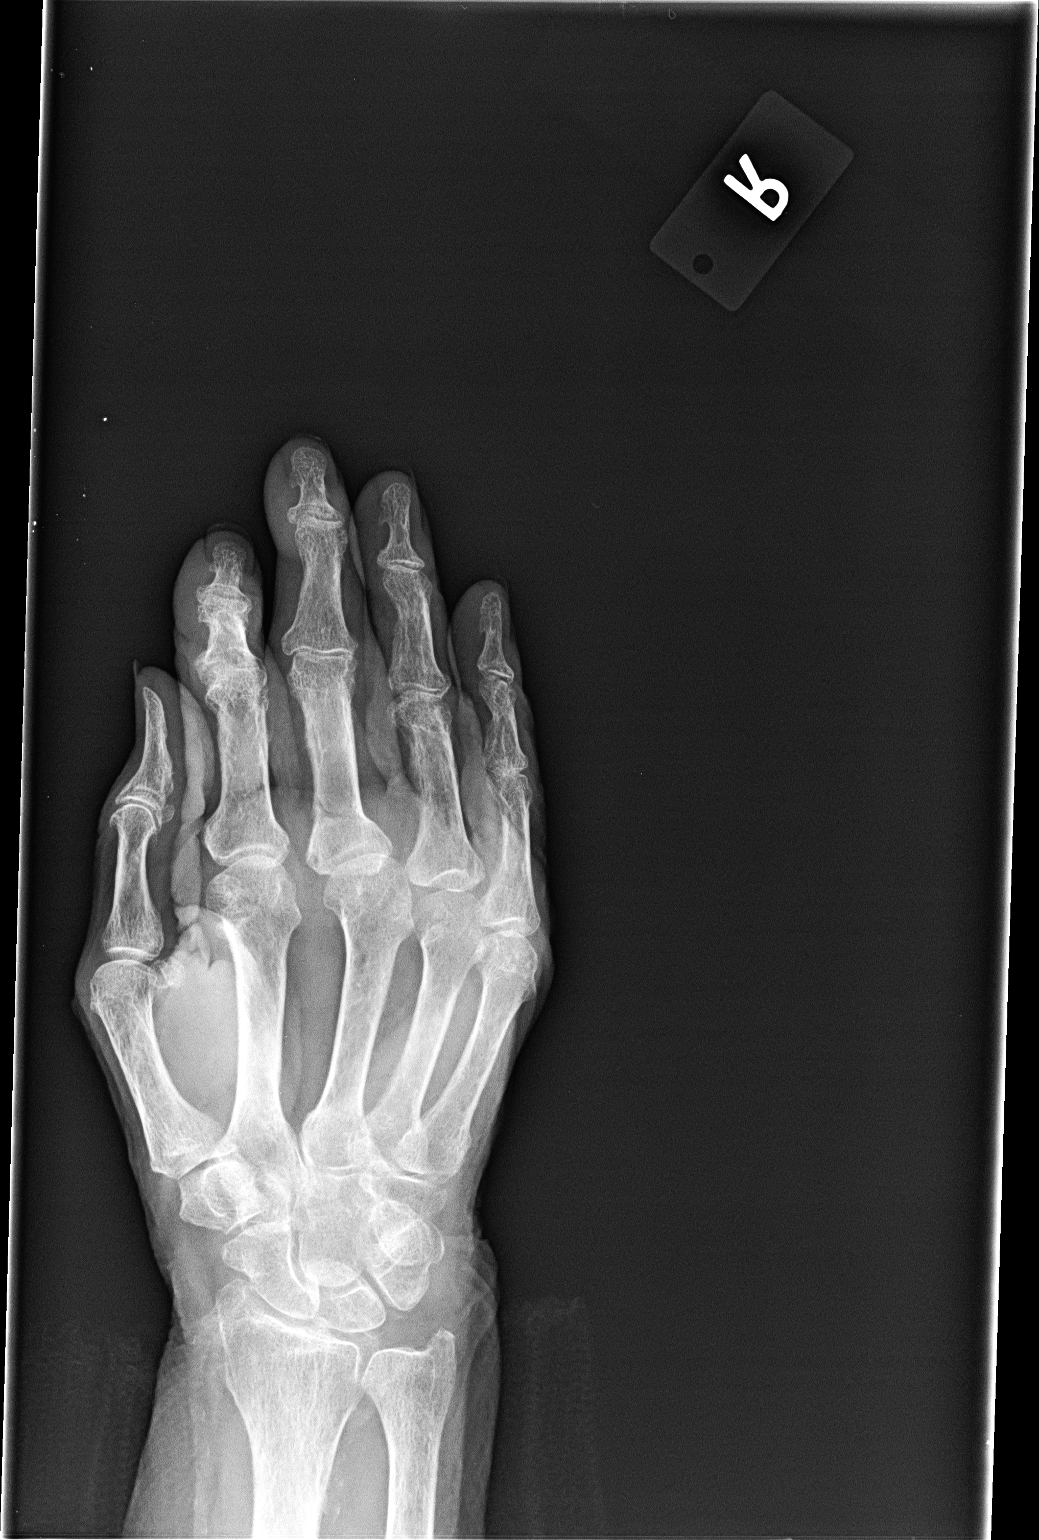

[3 of 3 positions shown; findings below may reference images not displayed]

FINDINGS: There is a nondisplaced fracture through the base of the proximal
phalanx of the right third digit with soft tissue swelling. No other
acute fracture is seen. The bones are osteopenic. There are
degenerative changes with some loss of radiocarpal joint space
present. Degenerative changes also are noted involving the DIP
joints diffusely. No erosion is seen. Vascular calcification is
present.
IMPRESSION: 1. Nondisplaced fracture through the base of the proximal phalanx of
the right third digit.
2. Degenerative change primarily involving the DIP joints diffusely.

## 2018-01-14 IMAGING — CR DG CHEST 2V
2 series · 2 of 2 positions shown · non-contrast
Comparison: 02/20/2015.

CLINICAL DATA: Cough and congestion. Permanent atrial fibrillation.
Complete heart block.

EXAM:
CHEST  2 VIEW

[view not recorded (1 of 2)]
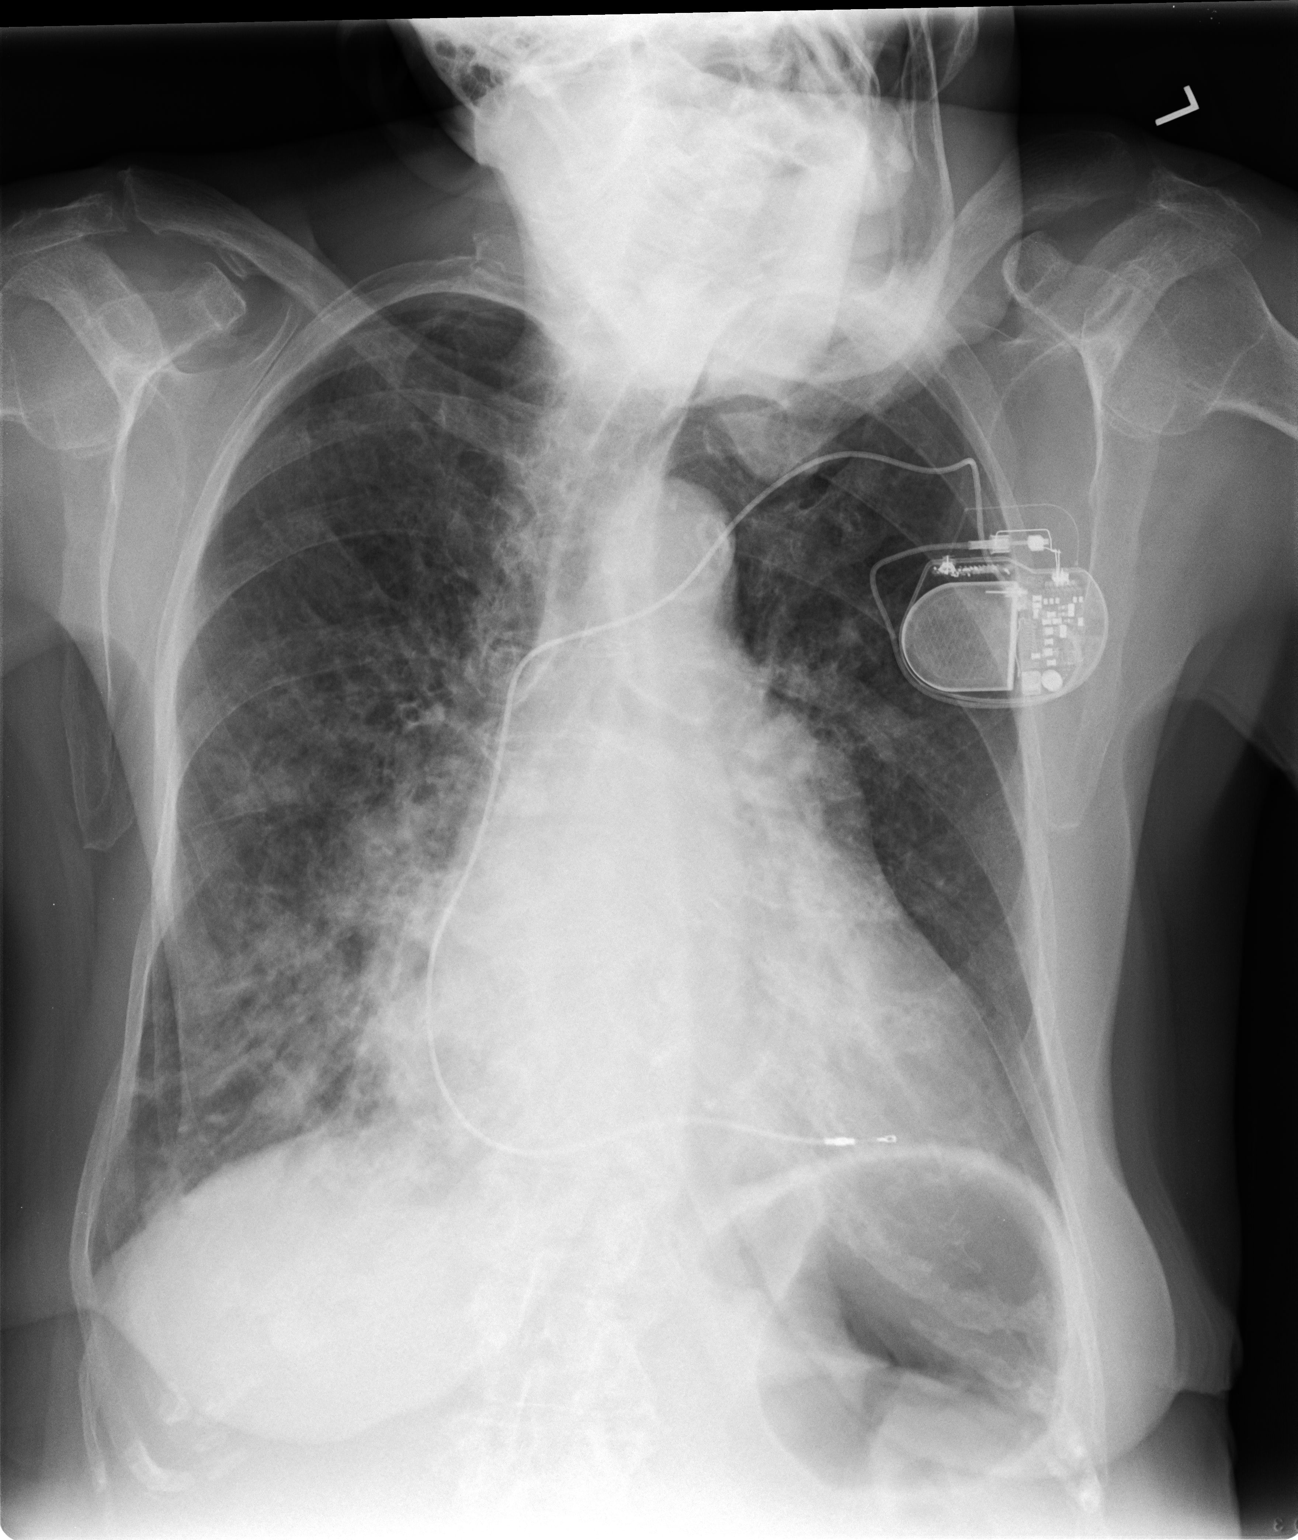

[view not recorded (2 of 2)]
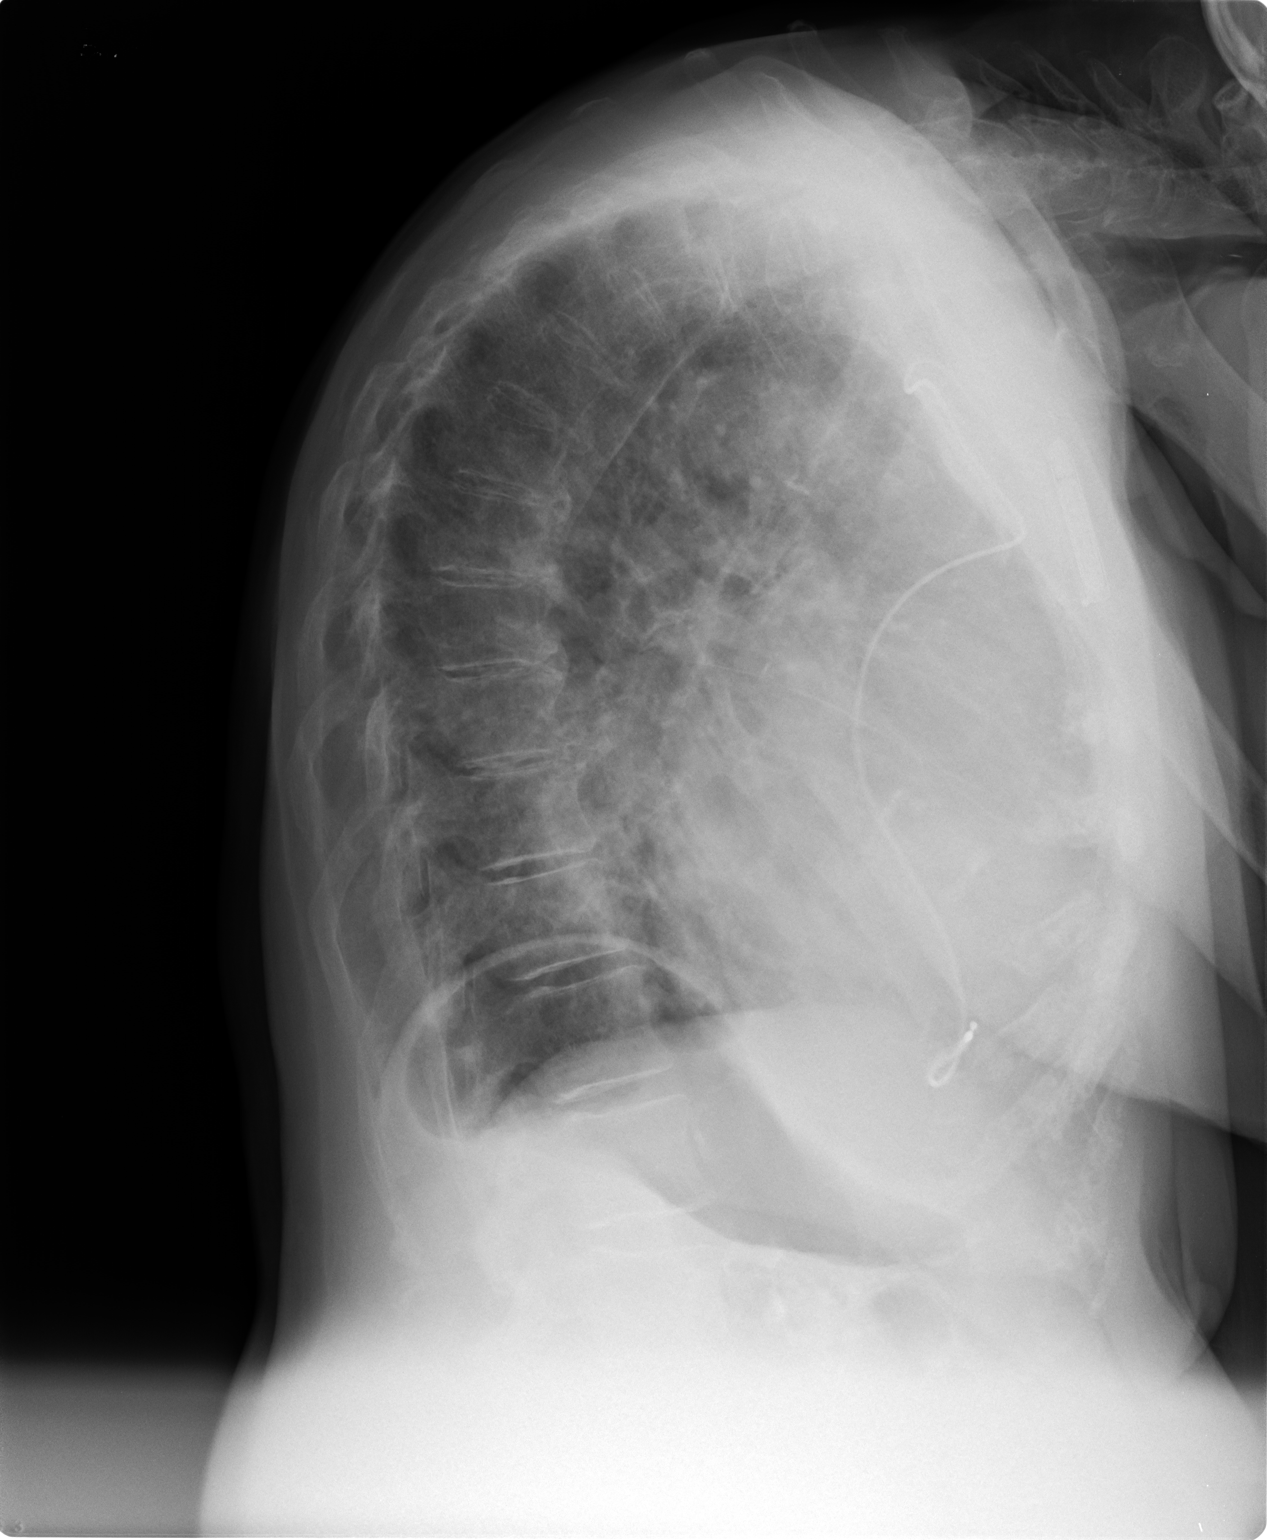

[2 of 2 positions shown; findings below may reference images not displayed]

FINDINGS: Cardiomegaly.  Prior min transvenous pacer is stable.

BILATERAL perihilar and lower lobe RIGHT greater than LEFT pulmonary
opacities increased from [place]. BILATERAL pneumonia is favored.
Congestive heart failure not excluded. No effusion or pneumothorax.
Osteopenia. No thoracic compression fracture.
IMPRESSION: New BILATERAL RIGHT greater than LEFT pulmonary opacities increased
from [place], favored to represent pneumonia.

## 2018-01-21 DIAGNOSIS — J9610 Chronic respiratory failure, unspecified whether with hypoxia or hypercapnia: Secondary | ICD-10-CM | POA: Diagnosis not present

## 2018-01-25 DIAGNOSIS — F0151 Vascular dementia with behavioral disturbance: Secondary | ICD-10-CM | POA: Diagnosis not present

## 2018-01-25 DIAGNOSIS — F062 Psychotic disorder with delusions due to known physiological condition: Secondary | ICD-10-CM | POA: Diagnosis not present

## 2018-01-26 LAB — CUP PACEART REMOTE DEVICE CHECK
Battery Remaining Percentage: 91 %
Battery Voltage: 2.95 V
Implantable Lead Implant Date: 20120717
Implantable Lead Location: 753860
Implantable Lead Model: 1948
Implantable Pulse Generator Implant Date: 20120717
Lead Channel Pacing Threshold Amplitude: 0.5 V
Lead Channel Pacing Threshold Pulse Width: 0.4 ms
Lead Channel Setting Pacing Pulse Width: 0.4 ms
Lead Channel Setting Sensing Sensitivity: 2.5 mV
MDC IDC MSMT BATTERY REMAINING LONGEVITY: 123 mo
MDC IDC MSMT LEADCHNL RV IMPEDANCE VALUE: 610 Ohm
MDC IDC MSMT LEADCHNL RV SENSING INTR AMPL: 7.3 mV
MDC IDC PG SERIAL: 7245638
MDC IDC SESS DTM: 20191028071337
MDC IDC SET LEADCHNL RV PACING AMPLITUDE: 2.5 V
MDC IDC STAT BRADY RV PERCENT PACED: 41 %

## 2018-01-28 DIAGNOSIS — J9610 Chronic respiratory failure, unspecified whether with hypoxia or hypercapnia: Secondary | ICD-10-CM | POA: Diagnosis not present

## 2018-02-02 DIAGNOSIS — Z961 Presence of intraocular lens: Secondary | ICD-10-CM | POA: Diagnosis not present

## 2018-02-02 DIAGNOSIS — H26493 Other secondary cataract, bilateral: Secondary | ICD-10-CM | POA: Diagnosis not present

## 2018-02-02 DIAGNOSIS — Z7901 Long term (current) use of anticoagulants: Secondary | ICD-10-CM | POA: Diagnosis not present

## 2018-02-11 DIAGNOSIS — F062 Psychotic disorder with delusions due to known physiological condition: Secondary | ICD-10-CM | POA: Diagnosis not present

## 2018-02-11 DIAGNOSIS — F0151 Vascular dementia with behavioral disturbance: Secondary | ICD-10-CM | POA: Diagnosis not present

## 2018-02-12 DIAGNOSIS — M10049 Idiopathic gout, unspecified hand: Secondary | ICD-10-CM | POA: Diagnosis not present

## 2018-02-12 DIAGNOSIS — J9610 Chronic respiratory failure, unspecified whether with hypoxia or hypercapnia: Secondary | ICD-10-CM | POA: Diagnosis not present

## 2018-02-12 DIAGNOSIS — I5022 Chronic systolic (congestive) heart failure: Secondary | ICD-10-CM | POA: Diagnosis not present

## 2018-02-12 DIAGNOSIS — G309 Alzheimer's disease, unspecified: Secondary | ICD-10-CM | POA: Diagnosis not present

## 2018-02-22 ENCOUNTER — Ambulatory Visit: Payer: PPO

## 2018-02-22 DIAGNOSIS — I442 Atrioventricular block, complete: Secondary | ICD-10-CM | POA: Diagnosis not present

## 2018-02-23 ENCOUNTER — Ambulatory Visit: Payer: PPO | Admitting: Cardiology

## 2018-02-23 ENCOUNTER — Ambulatory Visit (INDEPENDENT_AMBULATORY_CARE_PROVIDER_SITE_OTHER): Payer: PPO

## 2018-02-23 DIAGNOSIS — I442 Atrioventricular block, complete: Secondary | ICD-10-CM

## 2018-02-23 NOTE — Progress Notes (Deleted)
Clinical Summary [name] is a 85 y.o.female seen today for follow up of the following medical problems.   1. Heart block - pacemaker followed by Dr Rayann Heman - no recent symptoms, device check scheduled for next month    2. PAF - no recent palpitations. No bleeding on xarelto.  3. Valvular heart disease - 09/2016 echo mild to mod AI, mild AS, mod MR, mod TR.  - some recent leg swelling and SOB. Started on IV lasix at nursing home.    4. Low normal to mildly decreased LV function - 09/2016 echo LVEF 45-50%. Limited visualization, could not eval diastolic function. Severe biatrail enlargement suggests significant diastolic dysfunction  - some recent SOB, leg swelling. Diagnosed with pneumonia at her nursing home. Started on IV ceftraixone as well as IV lasix there. SYmptoms are improving.   6. Pulmonary HTN - PASP 65 by last echo - suspected related to left sided heart disease with evidence of advanced diastolic dysfunction, as well as chronic lung disease with O2 dependent COPD.  7. COPD - on home O2  8. Pneumonioa - on IV ceftriaxone started yesterday at nursing home   Past Medical History:  Diagnosis Date  . Chronic lung disease    on home O2   . Complete heart block (HCC)    s/p PPM by Dr Rayann Heman  . Debilitated   . Diastolic dysfunction   . Hypertension   . Lymphedema of right lower extremity   . MI (mitral incompetence)   . Obesity   . Permanent atrial fibrillation ([place])   . Pulmonary hypertension ([place])   . Rheumatoid arthritis(714.0)   . Sleep apnea   . Stroke [facility])    remote  . Venous insufficiency    with chronic leg ulcers     No Known Allergies   Current Outpatient Medications  Medication Sig Dispense Refill  . acetaminophen (TYLENOL) 325 MG tablet Take 650 mg by mouth every 4 (four) hours as needed.    . Cholecalciferol (VITAMIN D) 2000 units CAPS Take 1 capsule by mouth daily.    Marland Kitchen donepezil (ARICEPT) 10 MG tablet Take 1  tablet (10 mg total) by mouth at bedtime. 90 tablet 1  . furosemide (LASIX) 20 MG tablet Take 20 mg by mouth daily.    . hydrocortisone (PROCTOZONE-HC) 2.5 % rectal cream Place 1 application rectally 2 (two) times daily. 30 g 0  . ipratropium-albuterol (DUONEB) 0.5-2.5 (3) MG/3ML SOLN Take 3 mLs by nebulization every 6 (six) hours as needed.    Marland Kitchen levothyroxine (SYNTHROID, LEVOTHROID) 50 MCG tablet TAKE ONE (1) TABLET EACH DAY 90 tablet 2  . memantine (NAMENDA) 10 MG tablet Take 1 tablet (10 mg total) by mouth 2 (two) times daily. 180 tablet 1  . Menthol-Zinc Oxide (CALMOSEPTINE) 0.44-20.6 % OINT Apply topically every 8 (eight) hours as needed (apply to buttock).    . metoprolol succinate (TOPROL-XL) 25 MG 24 hr tablet TAKE 1/2 TABLET DAILY 45 tablet 1  . MISC NATURAL PRODUCTS EX Apply topically. eucerin cream as needed to legs for dry skin    . Multiple Vitamins-Minerals (MULTIVITAMIN WITH MINERALS) tablet Take 1 tablet by mouth daily.    . OXYGEN-HELIUM IN Inhale 4 L into the lungs continuous. 4L    . QUEtiapine (SEROQUEL) 25 MG tablet TAKE ONE TABLET DAILY AT BEDTIME 90 tablet 0  . Rivaroxaban (XARELTO) 15 MG TABS tablet Take 1 tablet (15 mg total) by mouth daily with supper. 28 tablet 0  .  simvastatin (ZOCOR) 20 MG tablet TAKE ONE (1) TABLET EACH DAY 90 tablet 1   No current facility-administered medications for this visit.      Past Surgical History:  Procedure Laterality Date  . PACEMAKER INSERTION  08/13/10   SJM by Dr Rayann Heman     No Known Allergies    Family History  Problem Relation Age of Onset  . Colon cancer Mother   . Alzheimer's disease Mother   . Congestive Heart Failure Father      Social History Ms. Straka reports that she has never smoked. She has never used smokeless tobacco. Ms. Labella reports no history of alcohol use.   Review of Systems CONSTITUTIONAL: No weight loss, fever, chills, weakness or fatigue.  HEENT: Eyes: No visual loss, blurred  vision, double vision or yellow sclerae.No hearing loss, sneezing, congestion, runny nose or sore throat.  SKIN: No rash or itching.  CARDIOVASCULAR:  RESPIRATORY: No shortness of breath, cough or sputum.  GASTROINTESTINAL: No anorexia, nausea, vomiting or diarrhea. No abdominal pain or blood.  GENITOURINARY: No burning on urination, no polyuria NEUROLOGICAL: No headache, dizziness, syncope, paralysis, ataxia, numbness or tingling in the extremities. No change in bowel or bladder control.  MUSCULOSKELETAL: No muscle, back pain, joint pain or stiffness.  LYMPHATICS: No enlarged nodes. No history of splenectomy.  PSYCHIATRIC: No history of depression or anxiety.  ENDOCRINOLOGIC: No reports of sweating, cold or heat intolerance. No polyuria or polydipsia.  Marland Kitchen   Physical Examination There were no vitals filed for this visit. There were no vitals filed for this visit.  Gen: resting comfortably, no acute distress HEENT: no scleral icterus, pupils equal round and reactive, no palptable cervical adenopathy,  CV Resp: Clear to auscultation bilaterally GI: abdomen is soft, non-tender, non-distended, normal bowel sounds, no hepatosplenomegaly MSK: extremities are warm, no edema.  Skin: warm, no rash Neuro:  no focal deficits Psych: appropriate affect   Diagnostic Studies 04/2014 Echo Study Conclusions  - Left ventricle: The cavity size was normal. Wall thickness was normal. Systolic function was normal. The estimated ejection fraction was in the range of 55% to 60%. Abnormal diastolic function, indeterminate grade. Wall motion was normal; there were no regional wall motion abnormalities. - Aortic valve: Mildly calcified annulus. Trileaflet; mildly thickened leaflets. There was mild stenosis. There was mild to moderate regurgitation. Mean gradient (S): 13 mm Hg. Valve area (VTI): 1.56 cm^2. Valve area (Vmax): 1.45 cm^2. Valve area (Vmean): 1.34 cm^2. Regurgitation pressure  half-time: 542 ms. - Mitral valve: Mildly to moderately calcified annulus. Mildly thickened leaflets . There was moderate regurgitation. The MR VC is 0.5 cm. - Left atrium: The atrium was severely dilated. - Right ventricle: The cavity size was mildly dilated. - Right atrium: The atrium was severely dilated. - Tricuspid valve: There are 2 seperate TR jets. TR VC for jet 1 is 4.0 cm, the TR VC for jet 2 is 0.3 cm. The composite of the jets is moderate to severe TR. - Pulmonary arteries: Systolic pressure was moderately increased. PA peak pressure: 57 mm Hg (S). - Inferior vena cava: The vessel was normal in size. The respirophasic diameter changes were in the normal range (>= 50%), consistent with normal central venous pressure. - Technically adequate study.   09/2016 echo Study Conclusions  - Left ventricle: The cavity size was normal. Wall thickness was normal. Diastolic dysfunction, grade indeterminate. Systolic function was mildly reduced. The estimated ejection fraction was in the range of 45% to 50%. Diffuse hypokinesis. Doppler parameters  are consistent with high ventricular filling pressure. - Regional wall motion abnormality: Mild hypokinesis of the apical myocardium. - Ventricular septum: Septal motion showed abnormal function and dyssynergy. These changes are consistent with right ventricular pacing. - Aortic valve: Moderately calcified annulus. Mildly thickened leaflets. There was mild stenosis. There was mild to moderate regurgitation. Peak velocity (S): 225 cm/s. Mean gradient (S): 11 mm Hg. - Mitral valve: Moderately to severely calcified annulus. Mildly thickened leaflets . There was moderate eccentric regurgitation. - Left atrium: The atrium was severely dilated. - Right ventricle: The cavity size was mildly dilated. Pacer wire or catheter noted in right ventricle. Systolic function was mildly reduced. - Right atrium:  The atrium was severely dilated. - Tricuspid valve: There was moderate regurgitation. - Pulmonic valve: There was mild regurgitation. - Pulmonary arteries: Systolic pressure was moderately to severely increased. PA peak pressure: 65 mm Hg (S).  Impressions:  - Systolic function was difficult to assess in all views, but overall appeared mildly reduced, LVEF 45-50%. Consider a repeat limited study with contrast for a more accurate assessment of LVEF and regional wall motion.     Assessment and Plan  1. Heart block - pacemaker management per Dr Rayann Heman -f/u with device clinic next month  2. Valvular heart disease/Acute on chronic diastolic HF - overall mild to moderate valve disease, continue to monitor - recent fluid overload in setting of pneumonia. On IV lasix 40mg  bid x 3 days at nursing home, agree with treatment, would hold her oral lasix while on IV therapy.   3.Afib - CHADS2vasc score is 3, continueanticoag - doing well, continue current meds  4. Pulm HTN - no symptoms, continue current medical therapy. No indication for pulmonary vasodilators, primarily left sided heart disease and COPD as etiology - continue to monitor      Arnoldo Lenis, M.D., F.A.C.C.

## 2018-02-24 NOTE — Progress Notes (Signed)
Remote pacemaker transmission.   

## 2018-02-25 DIAGNOSIS — F0150 Vascular dementia without behavioral disturbance: Secondary | ICD-10-CM | POA: Diagnosis not present

## 2018-02-25 NOTE — Progress Notes (Signed)
Remote pacemaker transmission.   

## 2018-02-26 LAB — CUP PACEART REMOTE DEVICE CHECK
Battery Voltage: 2.95 V
Brady Statistic RV Percent Paced: 41 %
Date Time Interrogation Session: 20200128214259
Implantable Lead Implant Date: 20120717
Implantable Lead Location: 753860
Lead Channel Impedance Value: 610 Ohm
Lead Channel Pacing Threshold Amplitude: 0.5 V
Lead Channel Pacing Threshold Pulse Width: 0.4 ms
Lead Channel Sensing Intrinsic Amplitude: 7.9 mV
Lead Channel Setting Pacing Pulse Width: 0.4 ms
Lead Channel Setting Sensing Sensitivity: 2.5 mV
MDC IDC MSMT BATTERY REMAINING LONGEVITY: 123 mo
MDC IDC MSMT BATTERY REMAINING PERCENTAGE: 91 %
MDC IDC PG IMPLANT DT: 20120717
MDC IDC SET LEADCHNL RV PACING AMPLITUDE: 2.5 V
Pulse Gen Serial Number: 7245638

## 2018-03-03 DIAGNOSIS — I517 Cardiomegaly: Secondary | ICD-10-CM | POA: Diagnosis not present

## 2018-03-03 DIAGNOSIS — M25511 Pain in right shoulder: Secondary | ICD-10-CM | POA: Diagnosis not present

## 2018-03-03 DIAGNOSIS — W19XXXA Unspecified fall, initial encounter: Secondary | ICD-10-CM | POA: Diagnosis not present

## 2018-03-10 DIAGNOSIS — J9610 Chronic respiratory failure, unspecified whether with hypoxia or hypercapnia: Secondary | ICD-10-CM | POA: Diagnosis not present

## 2018-03-10 DIAGNOSIS — R278 Other lack of coordination: Secondary | ICD-10-CM | POA: Diagnosis not present

## 2018-03-18 DIAGNOSIS — I5022 Chronic systolic (congestive) heart failure: Secondary | ICD-10-CM | POA: Diagnosis not present

## 2018-03-18 DIAGNOSIS — I10 Essential (primary) hypertension: Secondary | ICD-10-CM | POA: Diagnosis not present

## 2018-03-18 DIAGNOSIS — N183 Chronic kidney disease, stage 3 (moderate): Secondary | ICD-10-CM | POA: Diagnosis not present

## 2018-03-18 DIAGNOSIS — I130 Hypertensive heart and chronic kidney disease with heart failure and stage 1 through stage 4 chronic kidney disease, or unspecified chronic kidney disease: Secondary | ICD-10-CM | POA: Diagnosis not present

## 2018-03-29 DIAGNOSIS — R278 Other lack of coordination: Secondary | ICD-10-CM | POA: Diagnosis not present

## 2018-03-29 DIAGNOSIS — J9610 Chronic respiratory failure, unspecified whether with hypoxia or hypercapnia: Secondary | ICD-10-CM | POA: Diagnosis not present

## 2018-04-01 DIAGNOSIS — G309 Alzheimer's disease, unspecified: Secondary | ICD-10-CM | POA: Diagnosis not present

## 2018-04-01 DIAGNOSIS — I4891 Unspecified atrial fibrillation: Secondary | ICD-10-CM | POA: Diagnosis not present

## 2018-04-01 DIAGNOSIS — R1311 Dysphagia, oral phase: Secondary | ICD-10-CM | POA: Diagnosis not present

## 2018-04-01 DIAGNOSIS — M10049 Idiopathic gout, unspecified hand: Secondary | ICD-10-CM | POA: Diagnosis not present

## 2018-04-05 DIAGNOSIS — F0150 Vascular dementia without behavioral disturbance: Secondary | ICD-10-CM | POA: Diagnosis not present

## 2018-04-14 DIAGNOSIS — I5022 Chronic systolic (congestive) heart failure: Secondary | ICD-10-CM | POA: Diagnosis not present

## 2018-04-14 DIAGNOSIS — I4891 Unspecified atrial fibrillation: Secondary | ICD-10-CM | POA: Diagnosis not present

## 2018-04-14 DIAGNOSIS — Z950 Presence of cardiac pacemaker: Secondary | ICD-10-CM | POA: Diagnosis not present

## 2018-04-14 DIAGNOSIS — L853 Xerosis cutis: Secondary | ICD-10-CM | POA: Diagnosis not present

## 2018-04-15 ENCOUNTER — Ambulatory Visit: Payer: PPO | Admitting: Cardiology

## 2018-04-28 DIAGNOSIS — R278 Other lack of coordination: Secondary | ICD-10-CM | POA: Diagnosis not present

## 2018-04-28 DIAGNOSIS — J9610 Chronic respiratory failure, unspecified whether with hypoxia or hypercapnia: Secondary | ICD-10-CM | POA: Diagnosis not present

## 2018-05-05 DIAGNOSIS — F0150 Vascular dementia without behavioral disturbance: Secondary | ICD-10-CM | POA: Diagnosis not present

## 2018-05-05 DIAGNOSIS — R0600 Dyspnea, unspecified: Secondary | ICD-10-CM | POA: Diagnosis not present

## 2018-05-06 DIAGNOSIS — N189 Chronic kidney disease, unspecified: Secondary | ICD-10-CM | POA: Diagnosis not present

## 2018-05-06 DIAGNOSIS — E039 Hypothyroidism, unspecified: Secondary | ICD-10-CM | POA: Diagnosis not present

## 2018-05-06 DIAGNOSIS — B351 Tinea unguium: Secondary | ICD-10-CM | POA: Diagnosis not present

## 2018-05-06 DIAGNOSIS — N182 Chronic kidney disease, stage 2 (mild): Secondary | ICD-10-CM | POA: Diagnosis not present

## 2018-05-06 DIAGNOSIS — I739 Peripheral vascular disease, unspecified: Secondary | ICD-10-CM | POA: Diagnosis not present

## 2018-05-06 DIAGNOSIS — D72829 Elevated white blood cell count, unspecified: Secondary | ICD-10-CM | POA: Diagnosis not present

## 2018-05-06 DIAGNOSIS — E782 Mixed hyperlipidemia: Secondary | ICD-10-CM | POA: Diagnosis not present

## 2018-05-06 DIAGNOSIS — Z79899 Other long term (current) drug therapy: Secondary | ICD-10-CM | POA: Diagnosis not present

## 2018-05-06 DIAGNOSIS — M25569 Pain in unspecified knee: Secondary | ICD-10-CM | POA: Diagnosis not present

## 2018-05-06 DIAGNOSIS — E119 Type 2 diabetes mellitus without complications: Secondary | ICD-10-CM | POA: Diagnosis not present

## 2018-05-06 DIAGNOSIS — I10 Essential (primary) hypertension: Secondary | ICD-10-CM | POA: Diagnosis not present

## 2018-05-20 DIAGNOSIS — G309 Alzheimer's disease, unspecified: Secondary | ICD-10-CM | POA: Diagnosis not present

## 2018-05-25 ENCOUNTER — Ambulatory Visit (INDEPENDENT_AMBULATORY_CARE_PROVIDER_SITE_OTHER): Payer: PPO | Admitting: *Deleted

## 2018-05-25 ENCOUNTER — Other Ambulatory Visit: Payer: Self-pay

## 2018-05-25 DIAGNOSIS — I4891 Unspecified atrial fibrillation: Secondary | ICD-10-CM

## 2018-05-25 DIAGNOSIS — I442 Atrioventricular block, complete: Secondary | ICD-10-CM

## 2018-05-25 LAB — CUP PACEART REMOTE DEVICE CHECK
Battery Remaining Longevity: 131 mo
Battery Remaining Percentage: 95.5 %
Battery Voltage: 2.96 V
Brady Statistic RV Percent Paced: 39 %
Date Time Interrogation Session: 20200427061715
Implantable Lead Implant Date: 20120717
Implantable Lead Location: 753860
Implantable Lead Model: 1948
Implantable Pulse Generator Implant Date: 20120717
Lead Channel Impedance Value: 590 Ohm
Lead Channel Pacing Threshold Amplitude: 0.5 V
Lead Channel Pacing Threshold Pulse Width: 0.4 ms
Lead Channel Sensing Intrinsic Amplitude: 6.1 mV
Lead Channel Setting Pacing Amplitude: 2.5 V
Lead Channel Setting Pacing Pulse Width: 0.4 ms
Lead Channel Setting Sensing Sensitivity: 2.5 mV
Pulse Gen Model: 1210
Pulse Gen Serial Number: 7245638

## 2018-05-27 DIAGNOSIS — R609 Edema, unspecified: Secondary | ICD-10-CM | POA: Diagnosis not present

## 2018-05-27 DIAGNOSIS — I5022 Chronic systolic (congestive) heart failure: Secondary | ICD-10-CM | POA: Diagnosis not present

## 2018-05-27 DIAGNOSIS — J9610 Chronic respiratory failure, unspecified whether with hypoxia or hypercapnia: Secondary | ICD-10-CM | POA: Diagnosis not present

## 2018-05-27 DIAGNOSIS — G309 Alzheimer's disease, unspecified: Secondary | ICD-10-CM | POA: Diagnosis not present

## 2018-05-28 DIAGNOSIS — G309 Alzheimer's disease, unspecified: Secondary | ICD-10-CM | POA: Diagnosis not present

## 2018-06-01 DIAGNOSIS — F0150 Vascular dementia without behavioral disturbance: Secondary | ICD-10-CM | POA: Diagnosis not present

## 2018-06-02 DIAGNOSIS — R269 Unspecified abnormalities of gait and mobility: Secondary | ICD-10-CM | POA: Diagnosis not present

## 2018-06-02 DIAGNOSIS — M79606 Pain in leg, unspecified: Secondary | ICD-10-CM | POA: Diagnosis not present

## 2018-06-02 DIAGNOSIS — G309 Alzheimer's disease, unspecified: Secondary | ICD-10-CM | POA: Diagnosis not present

## 2018-06-02 DIAGNOSIS — R609 Edema, unspecified: Secondary | ICD-10-CM | POA: Diagnosis not present

## 2018-06-03 NOTE — Progress Notes (Signed)
Remote pacemaker transmission.   

## 2018-06-14 ENCOUNTER — Ambulatory Visit: Payer: PPO | Admitting: Cardiology

## 2018-06-22 DIAGNOSIS — Z79899 Other long term (current) drug therapy: Secondary | ICD-10-CM | POA: Diagnosis not present

## 2018-06-22 DIAGNOSIS — I5020 Unspecified systolic (congestive) heart failure: Secondary | ICD-10-CM | POA: Diagnosis not present

## 2018-06-24 ENCOUNTER — Telehealth: Payer: Self-pay | Admitting: Cardiology

## 2018-06-24 NOTE — Telephone Encounter (Signed)
Daughter says weight was 180lbs this week and nurse at [facility] told daughter BNP was elevated and kidney function was decreased. Will contact [facility] to obtain lab results. Does she need a virtual video appt ? Daughter says [facility] could accommodate this if needed. Next available appt is 6/12

## 2018-06-24 NOTE — Telephone Encounter (Signed)
Pt scheduled for 6/3 @ 330pm. [facility] and spoke to nurse who will have scheduling call us back to verify phone number we are to text for video appt. Also made daughter aware of telehealth appt - labs were requested

## 2018-06-24 NOTE — Telephone Encounter (Signed)
Patients daughter [name]) called stating that patient is at [facility].  States that her BNP was elevated. Her kidney functions are not good. Patient is gaining weight. Doctor at Nursing home is wanting to cut her Lasix .   Please call [phone],

## 2018-06-24 NOTE — Telephone Encounter (Signed)
Is there anything earlier with PA?   J Amarra Sawyer MD

## 2018-06-25 DIAGNOSIS — Z13818 Encounter for screening for other digestive system disorders: Secondary | ICD-10-CM | POA: Diagnosis not present

## 2018-06-25 DIAGNOSIS — Z20828 Contact with and (suspected) exposure to other viral communicable diseases: Secondary | ICD-10-CM | POA: Diagnosis not present

## 2018-06-29 ENCOUNTER — Telehealth: Payer: Self-pay | Admitting: Student

## 2018-06-29 NOTE — Telephone Encounter (Signed)
Virtual Visit Pre-Appointment Phone Call  "(Name), I am calling you today to discuss your upcoming appointment. We are currently trying to limit exposure to the virus that causes COVID-19 by seeing patients at home rather than in the office."  1. "What is the BEST phone number to call the day of the visit?" - include this in appointment notes  2. Do you have or have access to (through a family member/friend) a smartphone with video capability that we can use for your visit?" a. If yes - list this number in appt notes as cell (if different from BEST phone #) and list the appointment type as a VIDEO visit in appointment notes b. If no - list the appointment type as a PHONE visit in appointment notes  3. Confirm consent - "In the setting of the current Covid19 crisis, you are scheduled for a (phone or video) visit with your provider on (date) at (time).  Just as we do with many in-office visits, in order for you to participate in this visit, we must obtain consent.  If you'd like, I can send this to your mychart (if signed up) or email for you to review.  Otherwise, I can obtain your verbal consent now.  All virtual visits are billed to your insurance company just like a normal visit would be.  By agreeing to a virtual visit, we'd like you to understand that the technology does not allow for your provider to perform an examination, and thus may limit your provider's ability to fully assess your condition. If your provider identifies any concerns that need to be evaluated in person, we will make arrangements to do so.  Finally, though the technology is pretty good, we cannot assure that it will always work on either your or our end, and in the setting of a video visit, we may have to convert it to a phone-only visit.  In either situation, we cannot ensure that we have a secure connection.  Are you willing to proceed?" STAFF: Did the patient verbally acknowledge consent to telehealth visit? Document  YES/NO here: Yes  4. Advise patient to be prepared - "Two hours prior to your appointment, go ahead and check your blood pressure, pulse, oxygen saturation, and your weight (if you have the equipment to check those) and write them all down. When your visit starts, your provider will ask you for this information. If you have an Apple Watch or Kardia device, please plan to have heart rate information ready on the day of your appointment. Please have a pen and paper handy nearby the day of the visit as well."  5. Give patient instructions for MyChart download to smartphone OR [email or web address] as below if video visit (depending on what platform provider is using)  6. Inform patient they will receive a phone call 15 minutes prior to their appointment time (may be from unknown caller ID) so they should be prepared to answer    TELEPHONE CALL NOTE  [name] has been deemed a candidate for a follow-up tele-health visit to limit community exposure during the Covid-19 pandemic. I spoke with the patient via phone to ensure availability of phone/video source, confirm preferred email & phone number, and discuss instructions and expectations.  I reminded [name] to be prepared with any vital sign and/or heart rhythm information that could potentially be obtained via home monitoring, at the time of her visit. I reminded [name] to expect a phone call prior to  her visit.  Terry L Goins 06/29/2018 11:15 AM

## 2018-06-30 ENCOUNTER — Other Ambulatory Visit: Payer: Self-pay

## 2018-06-30 ENCOUNTER — Telehealth (INDEPENDENT_AMBULATORY_CARE_PROVIDER_SITE_OTHER): Payer: PPO | Admitting: Student

## 2018-06-30 ENCOUNTER — Encounter: Payer: Self-pay | Admitting: Student

## 2018-06-30 VITALS — Wt 182.0 lb

## 2018-06-30 DIAGNOSIS — I442 Atrioventricular block, complete: Secondary | ICD-10-CM

## 2018-06-30 DIAGNOSIS — I5032 Chronic diastolic (congestive) heart failure: Secondary | ICD-10-CM | POA: Diagnosis not present

## 2018-06-30 DIAGNOSIS — I10 Essential (primary) hypertension: Secondary | ICD-10-CM

## 2018-06-30 DIAGNOSIS — F0150 Vascular dementia without behavioral disturbance: Secondary | ICD-10-CM | POA: Diagnosis not present

## 2018-06-30 DIAGNOSIS — I4821 Permanent atrial fibrillation: Secondary | ICD-10-CM

## 2018-06-30 MED ORDER — FUROSEMIDE 40 MG PO TABS: ORAL_TABLET | ORAL | 3 refills | Status: DC

## 2018-06-30 NOTE — Progress Notes (Signed)
Virtual Visit via Telephone Note   This visit type was conducted due to national recommendations for restrictions regarding the COVID-19 Pandemic (e.g. social distancing) in an effort to limit this patient's exposure and mitigate transmission in our community.  Due to her co-morbid illnesses, this patient is at least at moderate risk for complications without adequate follow up.  This format is felt to be most appropriate for this patient at this time.  The patient did not have access to video technology/had technical difficulties with video requiring transitioning to audio format only (telephone).  All issues noted in this document were discussed and addressed.  No physical exam could be performed with this format.  Please refer to the patient's chart for her  consent to telehealth for [facility].   Date:  06/30/2018   ID:  [name], DOB [birth year], MRN [id number]  Patient Location: [place] Provider Location: Office  PCP:  Hilbert Corrigan, MD  Cardiologist:  Carlyle Dolly, MD  Electrophysiologist:  Thompson Grayer, MD   Evaluation Performed:  Follow-Up Visit  Chief Complaint:  Worsening Edema and Weight Gain  History of Present Illness:    [name] is a 85 y.o. female with past medical history of permanent atrial fibrillation (on Xarelto), chronic diastolic CHF (EF 23-53% by echo in 09/2016), valvular heart disease, CHB (s/p St. Jude PPM placement), pulmonary HTN, and COPD who presents for a follow-up telehealth visit.   She was last examined by Dr. Harl Bowie in 09/2017 and reported worsening edema and dyspnea as she had been receiving intermittent IV Lasix at her SNF. Weight was stable at 159 lbs and it was recommended to resume her PO Lasix at 40mg  in AM/20mg  in PM once no longer receiving IV dosing.   Her daughter called the office on 5/28 reporting her weight was increasing and BNP was elevated, therefore a follow-up visit was arranged. Labs  from 5/26 are reviewed and show Na+ 144, K+ 4.7, and creatinine 1.07. BNP  436.  In talking with the patient and her nurse at SNF, they report her weight has continued to increase over the past month. Was previously in the 170's but now in the 180's with a peak of 186 lbs on 5/6, now at 182 lbs on most recent check on 5/21. Was prescribed Lasix 40mg  BID for several days but this was reduced back to once daily dosing last week for unclear reasons.   She has continued to experience worsening lower extremity edema and abdominal distension. No reported chest pain, dyspnea on exertion, orthopnea, or PND. She is wheel-chair bound at baseline.   The patient does not have symptoms concerning for COVID-19 infection (fever, chills, cough, or new shortness of breath).    Past Medical History:  Diagnosis Date  . Chronic lung disease    on home O2   . Complete heart block (HCC)    s/p PPM by Dr Rayann Heman  . Debilitated   . Diastolic dysfunction   . Hypertension   . Lymphedema of right lower extremity   . MI (mitral incompetence)   . Obesity   . Permanent atrial fibrillation   . Pulmonary hypertension ([place])   . Rheumatoid arthritis(714.0)   . Sleep apnea   . Stroke [facility])    remote  . Venous insufficiency    with chronic leg ulcers   Past Surgical History:  Procedure Laterality Date  . PACEMAKER INSERTION  08/13/10   SJM by Dr Rayann Heman  Current Meds  Medication Sig  . acetaminophen (TYLENOL) 325 MG tablet Take 650 mg by mouth every 4 (four) hours as needed.  . Cholecalciferol (VITAMIN D) 2000 units CAPS Take 1 capsule by mouth daily.  Marland Kitchen donepezil (ARICEPT) 10 MG tablet Take 1 tablet (10 mg total) by mouth at bedtime.  . hydrocortisone (PROCTOZONE-HC) 2.5 % rectal cream Place 1 application rectally 2 (two) times daily.  Marland Kitchen ipratropium-albuterol (DUONEB) 0.5-2.5 (3) MG/3ML SOLN Take 3 mLs by nebulization every 6 (six) hours as needed.  Marland Kitchen levothyroxine (SYNTHROID, LEVOTHROID) 50 MCG tablet TAKE  ONE (1) TABLET EACH DAY  . memantine (NAMENDA) 10 MG tablet Take 1 tablet (10 mg total) by mouth 2 (two) times daily.  . Menthol-Zinc Oxide (CALMOSEPTINE) 0.44-20.6 % OINT Apply topically every 8 (eight) hours as needed (apply to buttock).  . metoprolol succinate (TOPROL-XL) 25 MG 24 hr tablet TAKE 1/2 TABLET DAILY  . MISC NATURAL PRODUCTS EX Apply topically. eucerin cream as needed to legs for dry skin  . Multiple Vitamins-Minerals (MULTIVITAMIN WITH MINERALS) tablet Take 1 tablet by mouth daily.  . OXYGEN-HELIUM IN Inhale 4 L into the lungs continuous. 4L  . QUEtiapine (SEROQUEL) 25 MG tablet TAKE ONE TABLET DAILY AT BEDTIME  . Rivaroxaban (XARELTO) 15 MG TABS tablet Take 1 tablet (15 mg total) by mouth daily with supper.  . simvastatin (ZOCOR) 20 MG tablet TAKE ONE (1) TABLET EACH DAY     Allergies:   Patient has no known allergies.   Social History   Tobacco Use  . Smoking status: Never Smoker  . Smokeless tobacco: Never Used  Substance Use Topics  . Alcohol use: No    Alcohol/week: 0.0 standard drinks  . Drug use: No     Family Hx: The patient's family history includes Alzheimer's disease in her mother; Colon cancer in her mother; Congestive Heart Failure in her father.  ROS:   Please see the history of present illness.     All other systems reviewed and are negative.   Prior CV studies:   The following studies were reviewed today:  Echocardiogram: 09/2016 Study Conclusions  - Left ventricle: The cavity size was normal. Wall thickness was   normal. Diastolic dysfunction, grade indeterminate. Systolic   function was mildly reduced. The estimated ejection fraction was   in the range of 45% to 50%. Diffuse hypokinesis. Doppler   parameters are consistent with high ventricular filling pressure. - Regional wall motion abnormality: Mild hypokinesis of the apical   myocardium. - Ventricular septum: Septal motion showed abnormal function and   dyssynergy. These changes  are consistent with right ventricular   pacing. - Aortic valve: Moderately calcified annulus. Mildly thickened   leaflets. There was mild stenosis. There was mild to moderate   regurgitation. Peak velocity (S): 225 cm/s. Mean gradient (S): 11   mm Hg. - Mitral valve: Moderately to severely calcified annulus. Mildly   thickened leaflets . There was moderate eccentric regurgitation. - Left atrium: The atrium was severely dilated. - Right ventricle: The cavity size was mildly dilated. Pacer wire   or catheter noted in right ventricle. Systolic function was   mildly reduced. - Right atrium: The atrium was severely dilated. - Tricuspid valve: There was moderate regurgitation. - Pulmonic valve: There was mild regurgitation. - Pulmonary arteries: Systolic pressure was moderately to severely   increased. PA peak pressure: 65 mm Hg (S).  Impressions:  - Systolic function was difficult to assess in all views, but   overall  appeared mildly reduced, LVEF 45-50%. Consider a repeat   limited study with contrast for a more accurate assessment of   LVEF and regional wall motion.  Labs/Other Tests and Data Reviewed:    EKG:  No ECG reviewed.  Recent Labs: No results found for requested labs within last 8760 hours.   Recent Lipid Panel Lab Results  Component Value Date/Time   CHOL 194 10/20/2016 11:21 AM   TRIG 109 10/20/2016 11:21 AM   HDL 69 10/20/2016 11:21 AM   CHOLHDL 2.8 10/20/2016 11:21 AM   LDLCALC 103 (H) 10/20/2016 11:21 AM    Wt Readings from Last 3 Encounters:  06/30/18 182 lb (82.6 kg)  10/21/17 159 lb 9.6 oz (72.4 kg)  08/28/17 161 lb (73 kg)     Objective:    Vital Signs:  Wt 182 lb (82.6 kg)   BMI 36.76 kg/m    Physical Exam not performed as this was a phone visit.   ASSESSMENT & PLAN:    1. Chronic Diastolic CHF - she has been experiencing worsening edema and abdominal distension for the past several weeks. Weight has increased by over 10 lbs during this  time frame and BNP was elevated to 436 on most recent check. - given her symptoms and recent labs, will increase Lasix to 40mg  in AM/20mg  in PM. Recheck BNP and BMET in one week. May require higher dosing of 40mg  BID. She resides at SNF so would not obtain an echo at this time given current COVID-19 situation. If symptoms do not improve, would consider obtaining in the next few months given her known valvular heart disease.   2. Permanent Atrial Fibrillation - rates have overall been well-controlled. Continue Toprol-XL at current dosing.  - no reports of active bleeding. Remains on Xarelto 15mg  daily. Creatinine clearance is currently 52 mL/min given her most recent labs and weight. Will need to follow both closely to make sure she remains on appropriate dosing for if this remains greater than 50, then her dosing will need to be adjusted to 20mg  daily.   3. CHB  - s/p St. Jude PPM placement. Most recent interrogation in 04/2018 showed normal device function. Followed by Dr. Rayann Heman.   4. HTN - BP has been well-controlled when checked at [facility]. Remains on Toprol-XL 12.5mg  daily.   5. COVID-19 Education - patient currently resides at [facility]. No recent symptoms. Patient's nurse reports they are testing residents for COVID this week (not aware of any positive patients in the facility thus far per her report).   Time:   Today, I have spent 13 minutes with the patient with telehealth technology discussing the above problems.     Medication Adjustments/Labs and Tests Ordered: Current medicines are reviewed at length with the patient today.  Concerns regarding medicines are outlined above.   Tests Ordered: No orders of the defined types were placed in this encounter.   Medication Changes: Meds ordered this encounter  Medications  . furosemide (LASIX) 40 MG tablet    Sig: Take Lasix 40 mg in the AM and Take 20 mg in the PM Daily    Dispense:  135 tablet    Refill:  3    Disposition:  Follow up  in 4-6 weeks (likely Virtual Visit given COVID-19 situation and patient residing in SNF).   Signed, Erma Heritage, PA-C  06/30/2018 5:13 PM    [facility]

## 2018-06-30 NOTE — Patient Instructions (Signed)
Medication Instructions:  Your physician has recommended you make the following change in your medication:  Take Lasix 40 mg in the AM and 20 mg in the PM Daily    Labwork: Your physician recommends that you return for lab work in: 1 Week (BNP, BMET)    Testing/Procedures: NONE  Follow-Up: Your physician recommends that you schedule a follow-up appointment in: 4-6 weeks with Bernerd Pho, PA-C   Any Other Special Instructions Will Be Listed Below (If Applicable).     If you need a refill on your cardiac medications before your next appointment, please call your pharmacy.  Thank you for choosing [facility]!

## 2018-07-07 DIAGNOSIS — Z79899 Other long term (current) drug therapy: Secondary | ICD-10-CM | POA: Diagnosis not present

## 2018-07-08 ENCOUNTER — Telehealth: Payer: Self-pay | Admitting: *Deleted

## 2018-07-08 NOTE — Telephone Encounter (Signed)
Lea at [facility] aware and will update [place] with labs in 2 weeks and weight (if needed)

## 2018-07-08 NOTE — Telephone Encounter (Signed)
Leah from [place] called said pt has gained 4lbs in the last week has been taking lasix 40 mg in the AM and 20 mg PM also still c/o SOB and swelling in feet and ankles - also labs were faxed BNP lab results and will have scanned and routed to provider

## 2018-07-08 NOTE — Telephone Encounter (Signed)
    Lab results reviewed. Given that her BNP remains elevated and renal function overall stable, would recommend titration of Lasix to 40 mg twice daily. Repeat BNP and BMET again in 2 weeks. Please have them call in the interm if weight continues to trend upwards despite dose adjustment.   Signed, Erma Heritage, PA-C 07/08/2018, 3:53 PM Pager: [phone]

## 2018-07-13 ENCOUNTER — Telehealth: Payer: Self-pay | Admitting: Cardiology

## 2018-07-13 NOTE — Telephone Encounter (Signed)
[name] made aware and will update [place] on Friday

## 2018-07-13 NOTE — Telephone Encounter (Signed)
Leah from [facility] says pt weight has gone from 188lbs to 193lbs in 5 days - c/o SOB/swelling all over- did increase lasix per last phone note to 40 mg bid and due for repeat BNP/BMP next week

## 2018-07-13 NOTE — Telephone Encounter (Signed)
Increase lasix to 60mg  bid and update [place] again on Friday   J Sephora Boyar MD

## 2018-07-13 NOTE — Telephone Encounter (Signed)
Denny Peon (from [facility]) called requesting to speak with Dr. Nelly Laurence nurse in regards to patient's weight.  Please call [phone]

## 2018-07-15 DIAGNOSIS — L603 Nail dystrophy: Secondary | ICD-10-CM | POA: Diagnosis not present

## 2018-07-15 DIAGNOSIS — I739 Peripheral vascular disease, unspecified: Secondary | ICD-10-CM | POA: Diagnosis not present

## 2018-07-16 ENCOUNTER — Telehealth: Payer: Self-pay | Admitting: *Deleted

## 2018-07-16 NOTE — Telephone Encounter (Signed)
Leah from [facility] f/u on pt weight today 184lbs - swelling in down and says pt is doing much better. Continue lasix at current dose?

## 2018-07-19 NOTE — Telephone Encounter (Signed)
COntinue same dose. She needs a BMET/MG in 1 week, also call and upate [place] in 1 week again on her weights.   Zandra Abts MD

## 2018-07-19 NOTE — Telephone Encounter (Signed)
Leah from [place] made aware and says BMP/MG was ordered for this Thursday

## 2018-07-21 DIAGNOSIS — R269 Unspecified abnormalities of gait and mobility: Secondary | ICD-10-CM | POA: Diagnosis not present

## 2018-07-21 DIAGNOSIS — I4891 Unspecified atrial fibrillation: Secondary | ICD-10-CM | POA: Diagnosis not present

## 2018-07-21 DIAGNOSIS — F0390 Unspecified dementia without behavioral disturbance: Secondary | ICD-10-CM | POA: Diagnosis not present

## 2018-07-21 DIAGNOSIS — Z79899 Other long term (current) drug therapy: Secondary | ICD-10-CM | POA: Diagnosis not present

## 2018-07-21 DIAGNOSIS — J9610 Chronic respiratory failure, unspecified whether with hypoxia or hypercapnia: Secondary | ICD-10-CM | POA: Diagnosis not present

## 2018-07-21 DIAGNOSIS — I5020 Unspecified systolic (congestive) heart failure: Secondary | ICD-10-CM | POA: Diagnosis not present

## 2018-07-21 DIAGNOSIS — E785 Hyperlipidemia, unspecified: Secondary | ICD-10-CM | POA: Diagnosis not present

## 2018-07-21 DIAGNOSIS — E039 Hypothyroidism, unspecified: Secondary | ICD-10-CM | POA: Diagnosis not present

## 2018-07-21 DIAGNOSIS — G309 Alzheimer's disease, unspecified: Secondary | ICD-10-CM | POA: Diagnosis not present

## 2018-07-23 ENCOUNTER — Encounter: Payer: Self-pay | Admitting: Cardiology

## 2018-07-23 ENCOUNTER — Telehealth: Payer: Self-pay | Admitting: *Deleted

## 2018-07-23 NOTE — Telephone Encounter (Signed)
[place] called with pt weight 07/19/2018 at 189lbs - is taking lasix 60 mg bid - they will fax lab results today

## 2018-07-26 NOTE — Telephone Encounter (Signed)
Weights are up 5 lbs since last week (184 to 189 lbs). Labs look ok, there one lab that is elevated showing she still has extra fluid. Can we stop lasix, start torsemide 40mg  in AM and 20mg  in PM, update [place] on weights Thursday. BMET/Mg in 2 weeks

## 2018-07-26 NOTE — Telephone Encounter (Signed)
Leah from [facility] made aware - updated medication list

## 2018-07-28 ENCOUNTER — Other Ambulatory Visit: Payer: Self-pay

## 2018-07-28 ENCOUNTER — Telehealth (INDEPENDENT_AMBULATORY_CARE_PROVIDER_SITE_OTHER): Payer: PPO | Admitting: Student

## 2018-07-28 ENCOUNTER — Encounter: Payer: Self-pay | Admitting: Student

## 2018-07-28 VITALS — Wt 189.0 lb

## 2018-07-28 DIAGNOSIS — R296 Repeated falls: Secondary | ICD-10-CM | POA: Diagnosis not present

## 2018-07-28 DIAGNOSIS — I4821 Permanent atrial fibrillation: Secondary | ICD-10-CM

## 2018-07-28 DIAGNOSIS — I5032 Chronic diastolic (congestive) heart failure: Secondary | ICD-10-CM

## 2018-07-28 DIAGNOSIS — F0150 Vascular dementia without behavioral disturbance: Secondary | ICD-10-CM | POA: Diagnosis not present

## 2018-07-28 DIAGNOSIS — I10 Essential (primary) hypertension: Secondary | ICD-10-CM

## 2018-07-28 DIAGNOSIS — J9610 Chronic respiratory failure, unspecified whether with hypoxia or hypercapnia: Secondary | ICD-10-CM | POA: Diagnosis not present

## 2018-07-28 DIAGNOSIS — I38 Endocarditis, valve unspecified: Secondary | ICD-10-CM

## 2018-07-28 DIAGNOSIS — I442 Atrioventricular block, complete: Secondary | ICD-10-CM

## 2018-07-28 DIAGNOSIS — G309 Alzheimer's disease, unspecified: Secondary | ICD-10-CM | POA: Diagnosis not present

## 2018-07-28 NOTE — Patient Instructions (Signed)
Medication Instructions:  Your physician recommends that you continue on your current medications as directed. Please refer to the Current Medication list given to you today.  Will call with medication changes   If you need a refill on your cardiac medications before your next appointment, please call your pharmacy.   Lab work: NONE   If you have labs (blood work) drawn today and your tests are completely normal, you will receive your results only by: Marland Kitchen MyChart Message (if you have MyChart) OR . A paper copy in the mail If you have any lab test that is abnormal or we need to change your treatment, we will call you to review the results.  Testing/Procedures: NONE   Follow-Up: At [facility], you and your health needs are our priority.  As part of our continuing mission to provide you with exceptional heart care, we have created designated Provider Care Teams.  These Care Teams include your primary Cardiologist (physician) and Advanced Practice Providers (APPs -  Physician Assistants and Nurse Practitioners) who all work together to provide you with the care you need, when you need it. You will need a follow up appointment in 6-8 weeks.  Please call our office 2 months in advance to schedule this appointment.  You may see Carlyle Dolly, MD or one of the following Advanced Practice Providers on your designated Care Team:   Bernerd Pho, PA-C [facility]) . Ermalinda Barrios, PA-C ([place])  Any Other Special Instructions Will Be Listed Below (If Applicable). Thank you for choosing [facility]!

## 2018-07-28 NOTE — Progress Notes (Signed)
Virtual Visit via Telephone Note   This visit type was conducted due to national recommendations for restrictions regarding the COVID-19 Pandemic (e.g. social distancing) in an effort to limit this patient's exposure and mitigate transmission in our community.  Due to her co-morbid illnesses, this patient is at least at moderate risk for complications without adequate follow up.  This format is felt to be most appropriate for this patient at this time.  The patient did not have access to video technology/had technical difficulties with video requiring transitioning to audio format only (telephone).  All issues noted in this document were discussed and addressed.  No physical exam could be performed with this format.  Please refer to the patient's chart for her  consent to telehealth for [facility].   Date:  07/28/2018   ID:  [name], DOB [birth year], MRN [id number]  Patient Location: [place] Provider Location: Home  PCP:  Hilbert Corrigan, MD  Cardiologist:  Carlyle Dolly, MD  Electrophysiologist:  Thompson Grayer, MD   Evaluation Performed:  Follow-Up Visit  Chief Complaint:  Weight Gain  History of Present Illness:    [name] is a 85 y.o. female  with past medical history of permanent atrial fibrillation, chronic diastolic CHF, valvular heart disease, CHB (s/p St. Jude PPM placement), Pulmonary HTN, and COPD who presents for a 34-month follow-up telehealth visit.   She most recently had a phone visit with myself on 06/30/2018 and most history was provided by nursing staff at [facility]. They reported her weight had continued to increase over the past several weeks and BNP was elevated to 436. She had been taking Lasix 20mg  BID and this was increased to 40mg  in AM/20mg  in PM given her variable renal function. Follow-up labs showed BNP remained elevated and renal function was stable, therefore Lasix was titrated to 40mg  BID on 07/08/2018. Nursing staff  called the office again on 6/16 reporting continued weight gain and Lasix was further titrated to 60mg  BID. Weight continued to be variable and Dr. Harl Bowie switched her from Lasix to Torsemide 40mg  in AM/20mg  in PM on 07/26/2018.  In talking with the patient's nurse today Denny Peon) she reports the patient was just switched to Torsemide yesterday and therefore they have not yet obtained a repeat weight. They typically weigh her once per week and this is scheduled for tomorrow morning. Was 189 lbs on most recent check. She has not reported any dyspnea or chest pain but the patient's nurse reports she becomes short of breath when emotional but then her symptoms improve once she calms down. She still has lower extremity edema and swelling along her hands bilaterally.   The patient does not have symptoms concerning for COVID-19 infection (fever, chills, cough, or new shortness of breath).    Past Medical History:  Diagnosis Date   Chronic lung disease    on home O2    Complete heart block (HCC)    s/p PPM by Dr Rayann Heman   Debilitated    Diastolic dysfunction    Hypertension    Lymphedema of right lower extremity    MI (mitral incompetence)    Obesity    Permanent atrial fibrillation    Pulmonary hypertension (HCC)    Rheumatoid arthritis(714.0)    Sleep apnea    Stroke [facility])    remote   Venous insufficiency    with chronic leg ulcers   Past Surgical History:  Procedure Laterality Date   PACEMAKER INSERTION  08/13/10  SJM by Dr Rayann Heman     Current Meds  Medication Sig   acetaminophen (TYLENOL) 325 MG tablet Take 650 mg by mouth every 4 (four) hours as needed.   Cholecalciferol (VITAMIN D) 2000 units CAPS Take 1 capsule by mouth daily.   donepezil (ARICEPT) 10 MG tablet Take 1 tablet (10 mg total) by mouth at bedtime.   hydrocortisone (PROCTOZONE-HC) 2.5 % rectal cream Place 1 application rectally 2 (two) times daily.   ipratropium-albuterol (DUONEB) 0.5-2.5 (3) MG/3ML  SOLN Take 3 mLs by nebulization every 6 (six) hours as needed.   levothyroxine (SYNTHROID, LEVOTHROID) 50 MCG tablet TAKE ONE (1) TABLET EACH DAY   memantine (NAMENDA) 10 MG tablet Take 1 tablet (10 mg total) by mouth 2 (two) times daily.   Menthol-Zinc Oxide (CALMOSEPTINE) 0.44-20.6 % OINT Apply topically every 8 (eight) hours as needed (apply to buttock).   metoprolol succinate (TOPROL-XL) 25 MG 24 hr tablet TAKE 1/2 TABLET DAILY   MISC NATURAL PRODUCTS EX Apply topically. eucerin cream as needed to legs for dry skin   Multiple Vitamins-Minerals (MULTIVITAMIN WITH MINERALS) tablet Take 1 tablet by mouth daily.   OXYGEN-HELIUM IN Inhale 4 L into the lungs continuous. 4L   QUEtiapine (SEROQUEL) 25 MG tablet TAKE ONE TABLET DAILY AT BEDTIME   Rivaroxaban (XARELTO) 15 MG TABS tablet Take 1 tablet (15 mg total) by mouth daily with supper.   simvastatin (ZOCOR) 20 MG tablet TAKE ONE (1) TABLET EACH DAY   torsemide (DEMADEX) 20 MG tablet Take 20 mg by mouth daily. Take 40 mg in the morning and 20 mg in the evening     Allergies:   Patient has no known allergies.   Social History   Tobacco Use   Smoking status: Never Smoker   Smokeless tobacco: Never Used  Substance Use Topics   Alcohol use: No    Alcohol/week: 0.0 standard drinks   Drug use: No     Family Hx: The patient's family history includes Alzheimer's disease in her mother; Colon cancer in her mother; Congestive Heart Failure in her father.  ROS:   Please see the history of present illness.     All other systems reviewed and are negative.   Prior CV studies:   The following studies were reviewed today:  Echocardiogram: 09/2016 Study Conclusions  - Left ventricle: The cavity size was normal. Wall thickness was   normal. Diastolic dysfunction, grade indeterminate. Systolic   function was mildly reduced. The estimated ejection fraction was   in the range of 45% to 50%. Diffuse hypokinesis. Doppler    parameters are consistent with high ventricular filling pressure. - Regional wall motion abnormality: Mild hypokinesis of the apical   myocardium. - Ventricular septum: Septal motion showed abnormal function and   dyssynergy. These changes are consistent with right ventricular   pacing. - Aortic valve: Moderately calcified annulus. Mildly thickened   leaflets. There was mild stenosis. There was mild to moderate   regurgitation. Peak velocity (S): 225 cm/s. Mean gradient (S): 11   mm Hg. - Mitral valve: Moderately to severely calcified annulus. Mildly   thickened leaflets . There was moderate eccentric regurgitation. - Left atrium: The atrium was severely dilated. - Right ventricle: The cavity size was mildly dilated. Pacer wire   or catheter noted in right ventricle. Systolic function was   mildly reduced. - Right atrium: The atrium was severely dilated. - Tricuspid valve: There was moderate regurgitation. - Pulmonic valve: There was mild regurgitation. - Pulmonary arteries:  Systolic pressure was moderately to severely   increased. PA peak pressure: 65 mm Hg (S).   Labs/Other Tests and Data Reviewed:    EKG:  No ECG reviewed.  Recent Labs: No results found for requested labs within last 8760 hours.   Recent Lipid Panel Lab Results  Component Value Date/Time   CHOL 194 10/20/2016 11:21 AM   TRIG 109 10/20/2016 11:21 AM   HDL 69 10/20/2016 11:21 AM   CHOLHDL 2.8 10/20/2016 11:21 AM   LDLCALC 103 (H) 10/20/2016 11:21 AM    Wt Readings from Last 3 Encounters:  07/28/18 189 lb (85.7 kg)  06/30/18 182 lb (82.6 kg)  10/21/17 159 lb 9.6 oz (72.4 kg)     Objective:    Vital Signs:  Wt 189 lb (85.7 kg)    BMI 38.17 kg/m    Not performed as this was a phone visit.   ASSESSMENT & PLAN:    1. Chronic Diastolic CHF - has been experiencing worsening edema and weight gain for the past several weeks despite titration of Lasix from 20mg  BID up to 60mg  BID. Was switched from  Lasix to Torsemide 40mg  in AM/20mg  in PM yesterday. She is scheduled for repeat weights tomorrow and I have asked them to call with these numbers. Previous weight was 189 lbs. If still trending upwards, would titrate to 40mg  BID. She is scheduled for repeat labs next week to include BMET, Mg, and BNP.   2. Permanent Atrial Fibrillation - no reports of recent palpitations and HR has been well-controlled. Continue Toprol-XL for rate-control. - no evidence of active bleeding. Continue Xarelto for anticoagulation.   3. CHB  - s/p St. Jude PPM placement and her device is followed by Dr. Rayann Heman.   4. Valvular Heart Disease - most recent echo showed mild AS, mild to moderate AI, moderate MR, and moderate TR.   5. HTN - BP has been well-controlled when checked at [facility]. Continue current regimen with Toprol-XL 12.5mg  daily    COVID-19 Education: The signs and symptoms of COVID-19 were discussed with the patient and how to seek care for testing (follow up with PCP or arrange E-visit).  The importance of social distancing was discussed today.  Time:   Today, I have spent 11 minutes with the patient with telehealth technology discussing the above problems.     Medication Adjustments/Labs and Tests Ordered: Current medicines are reviewed at length with the patient today.  Concerns regarding medicines are outlined above.   Tests Ordered: No orders of the defined types were placed in this encounter.   Medication Changes: No orders of the defined types were placed in this encounter.   Follow Up: Patient's Nurse will call with updated weights tomorrow. Telehealth follow-up visit in 6-8 weeks.   Signed, Erma Heritage, PA-C  07/28/2018 7:00 PM    [place]

## 2018-07-28 NOTE — Progress Notes (Deleted)
{Choose 1 Note Type (Telehealth Visit or Telephone Visit):[phone]}   Date:  07/28/2018   ID:  [name], DOB [birth year], MRN [id number]  {Patient Location:[phone]::"Home"} {Provider [phone]::"Home"}  PCP:  Hilbert Corrigan, MD  Cardiologist:  Carlyle Dolly, MD *** Electrophysiologist:  Thompson Grayer, MD   Evaluation Performed:  {Choose Visit IRWE:[id number]::"PPJKDT-OI Visit"}  Chief Complaint:  ***  History of Present Illness:    [name] is a 85 y.o. female with past medical history of permanent atrial fibrillation, chronic diastolic CHF, valvular heart disease, CHB (s/p St. Jude PPM placement), Pulmonary HTN, and COPD who presents for a 62-month follow-up telehealth visit.   She most recently had a phone visit with myself on 06/30/2018 and most history was provided by nursing staff at [facility]. They reported her weight had continued to increase over the past several weeks and BNP was elevated to 436. She had been taking Lasix 20mg  BID and this was increased to 40mg  in AM/20mg  in PM given her variable renal function. Follow-up labs showed BNP remained elevated and renal function was stable, therefore Lasix was titrated to 40mg  BID on 07/08/2018. Nursing staff called the office again on 6/16 reporting continued weight gain and Lasix was further titrated to 60mg  BID. Weight continued to be variable and Dr. Harl Bowie switched her from Lasix to torsemide 40mg  in AM/20mg  in PM on 07/26/2018.  The patient {does/does not:200015} have symptoms concerning for COVID-19 infection (fever, chills, cough, or new shortness of breath).    Past Medical History:  Diagnosis Date  . Chronic lung disease    on home O2   . Complete heart block (HCC)    s/p PPM by Dr Rayann Heman  . Debilitated   . Diastolic dysfunction   . Hypertension   . Lymphedema of right lower extremity   . MI (mitral incompetence)   . Obesity   . Permanent atrial fibrillation   . Pulmonary hypertension  ([place])   . Rheumatoid arthritis(714.0)   . Sleep apnea   . Stroke [facility])    remote  . Venous insufficiency    with chronic leg ulcers   Past Surgical History:  Procedure Laterality Date  . PACEMAKER INSERTION  08/13/10   SJM by Dr Rayann Heman     No outpatient medications have been marked as taking for the 07/28/18 encounter (Appointment) with Erma Heritage, PA-C.     Allergies:   Patient has no known allergies.   Social History   Tobacco Use  . Smoking status: Never Smoker  . Smokeless tobacco: Never Used  Substance Use Topics  . Alcohol use: No    Alcohol/week: 0.0 standard drinks  . Drug use: No     Family Hx: The patient's family history includes Alzheimer's disease in her mother; Colon cancer in her mother; Congestive Heart Failure in her father.  ROS:   Please see the history of present illness.    *** All other systems reviewed and are negative.   Prior CV studies:   The following studies were reviewed today:  Echocardiogram: 09/2016 Study Conclusions  - Left ventricle: The cavity size was normal. Wall thickness was   normal. Diastolic dysfunction, grade indeterminate. Systolic   function was mildly reduced. The estimated ejection fraction was   in the range of 45% to 50%. Diffuse hypokinesis. Doppler   parameters are consistent with high ventricular filling pressure. - Regional wall motion abnormality: Mild hypokinesis of the apical   myocardium. - Ventricular septum: Septal motion showed abnormal function  and   dyssynergy. These changes are consistent with right ventricular   pacing. - Aortic valve: Moderately calcified annulus. Mildly thickened   leaflets. There was mild stenosis. There was mild to moderate   regurgitation. Peak velocity (S): 225 cm/s. Mean gradient (S): 11   mm Hg. - Mitral valve: Moderately to severely calcified annulus. Mildly   thickened leaflets . There was moderate eccentric regurgitation. - Left atrium: The atrium was  severely dilated. - Right ventricle: The cavity size was mildly dilated. Pacer wire   or catheter noted in right ventricle. Systolic function was   mildly reduced. - Right atrium: The atrium was severely dilated. - Tricuspid valve: There was moderate regurgitation. - Pulmonic valve: There was mild regurgitation. - Pulmonary arteries: Systolic pressure was moderately to severely   increased. PA peak pressure: 65 mm Hg (S).  Impressions:  - Systolic function was difficult to assess in all views, but   overall appeared mildly reduced, LVEF 45-50%. Consider a repeat   limited study with contrast for a more accurate assessment of   LVEF and regional wall motion.  Labs/Other Tests and Data Reviewed:    EKG:  {EKG/Telemetry Strips Reviewed:[phone]}  Recent Labs: No results found for requested labs within last 8760 hours.   Recent Lipid Panel Lab Results  Component Value Date/Time   CHOL 194 10/20/2016 11:21 AM   TRIG 109 10/20/2016 11:21 AM   HDL 69 10/20/2016 11:21 AM   CHOLHDL 2.8 10/20/2016 11:21 AM   LDLCALC 103 (H) 10/20/2016 11:21 AM    Wt Readings from Last 3 Encounters:  06/30/18 182 lb (82.6 kg)  10/21/17 159 lb 9.6 oz (72.4 kg)  08/28/17 161 lb (73 kg)     Objective:    Vital Signs:  There were no vitals taken for this visit.   {[facility] Virtual Exam (Optional):[phone]::"VITAL SIGNS:  reviewed"}  ASSESSMENT & PLAN:    1. ***  COVID-19 Education: The signs and symptoms of COVID-19 were discussed with the patient and how to seek care for testing (follow up with PCP or arrange E-visit).  ***The importance of social distancing was discussed today.  Time:   Today, I have spent *** minutes with the patient with telehealth technology discussing the above problems.     Medication Adjustments/Labs and Tests Ordered: Current medicines are reviewed at length with the patient today.  Concerns regarding medicines are outlined above.   Tests Ordered: No  orders of the defined types were placed in this encounter.   Medication Changes: No orders of the defined types were placed in this encounter.   Follow Up:  {F/U Format:[phone]} {follow up:15908}  Signed, Erma Heritage, PA-C  07/28/2018 7:37 AM    [facility]

## 2018-07-29 ENCOUNTER — Telehealth: Payer: Self-pay | Admitting: *Deleted

## 2018-07-29 NOTE — Telephone Encounter (Signed)
Was advised to call Dr. Harl Bowie with weight for today 07/29/2018  Weight 187 lbs No swelling, dizziness or sob Medications reconciled

## 2018-08-02 NOTE — Telephone Encounter (Signed)
Some improvement in weights, would continue current regimen, Can they update [place] next week   J Mickey Hebel MD

## 2018-08-02 NOTE — Telephone Encounter (Signed)
[facility] contacted but no answer. Fax number [phone]. Message faxed to [place].

## 2018-08-09 DIAGNOSIS — N189 Chronic kidney disease, unspecified: Secondary | ICD-10-CM | POA: Diagnosis not present

## 2018-08-09 DIAGNOSIS — Z79899 Other long term (current) drug therapy: Secondary | ICD-10-CM | POA: Diagnosis not present

## 2018-08-17 DIAGNOSIS — E039 Hypothyroidism, unspecified: Secondary | ICD-10-CM | POA: Diagnosis not present

## 2018-08-17 DIAGNOSIS — E782 Mixed hyperlipidemia: Secondary | ICD-10-CM | POA: Diagnosis not present

## 2018-08-24 ENCOUNTER — Ambulatory Visit (INDEPENDENT_AMBULATORY_CARE_PROVIDER_SITE_OTHER): Payer: PPO | Admitting: *Deleted

## 2018-08-24 DIAGNOSIS — I442 Atrioventricular block, complete: Secondary | ICD-10-CM

## 2018-08-24 LAB — CUP PACEART REMOTE DEVICE CHECK
Battery Remaining Longevity: 121 mo
Battery Remaining Percentage: 91 %
Battery Voltage: 2.95 V
Brady Statistic RV Percent Paced: 38 %
Date Time Interrogation Session: 20200727064658
Implantable Lead Implant Date: 20120717
Implantable Lead Location: 753860
Implantable Lead Model: 1948
Implantable Pulse Generator Implant Date: 20120717
Lead Channel Impedance Value: 560 Ohm
Lead Channel Pacing Threshold Amplitude: 0.5 V
Lead Channel Pacing Threshold Pulse Width: 0.4 ms
Lead Channel Sensing Intrinsic Amplitude: 5.6 mV
Lead Channel Setting Pacing Amplitude: 2.5 V
Lead Channel Setting Pacing Pulse Width: 0.4 ms
Lead Channel Setting Sensing Sensitivity: 2.5 mV
Pulse Gen Model: 1210
Pulse Gen Serial Number: 7245638

## 2018-08-25 DIAGNOSIS — E039 Hypothyroidism, unspecified: Secondary | ICD-10-CM | POA: Diagnosis not present

## 2018-08-25 DIAGNOSIS — F0150 Vascular dementia without behavioral disturbance: Secondary | ICD-10-CM | POA: Diagnosis not present

## 2018-08-25 DIAGNOSIS — E785 Hyperlipidemia, unspecified: Secondary | ICD-10-CM | POA: Diagnosis not present

## 2018-08-30 DIAGNOSIS — G309 Alzheimer's disease, unspecified: Secondary | ICD-10-CM | POA: Diagnosis not present

## 2018-08-30 DIAGNOSIS — R269 Unspecified abnormalities of gait and mobility: Secondary | ICD-10-CM | POA: Diagnosis not present

## 2018-09-03 ENCOUNTER — Encounter: Payer: PPO | Admitting: Internal Medicine

## 2018-09-03 DIAGNOSIS — Z1383 Encounter for screening for respiratory disorder NEC: Secondary | ICD-10-CM | POA: Diagnosis not present

## 2018-09-03 DIAGNOSIS — I4891 Unspecified atrial fibrillation: Secondary | ICD-10-CM | POA: Diagnosis not present

## 2018-09-03 DIAGNOSIS — F0390 Unspecified dementia without behavioral disturbance: Secondary | ICD-10-CM | POA: Diagnosis not present

## 2018-09-03 DIAGNOSIS — E039 Hypothyroidism, unspecified: Secondary | ICD-10-CM | POA: Diagnosis not present

## 2018-09-03 DIAGNOSIS — E785 Hyperlipidemia, unspecified: Secondary | ICD-10-CM | POA: Diagnosis not present

## 2018-09-03 DIAGNOSIS — Z20828 Contact with and (suspected) exposure to other viral communicable diseases: Secondary | ICD-10-CM | POA: Diagnosis not present

## 2018-09-06 ENCOUNTER — Encounter: Payer: Self-pay | Admitting: Cardiology

## 2018-09-06 NOTE — Progress Notes (Signed)
Remote pacemaker transmission.   

## 2018-09-08 ENCOUNTER — Encounter: Payer: Self-pay | Admitting: Cardiology

## 2018-09-08 ENCOUNTER — Telehealth (INDEPENDENT_AMBULATORY_CARE_PROVIDER_SITE_OTHER): Payer: PPO | Admitting: Cardiology

## 2018-09-08 VITALS — BP 106/80 | HR 68 | Resp 14 | Ht 59.0 in | Wt 193.0 lb

## 2018-09-08 DIAGNOSIS — R600 Localized edema: Secondary | ICD-10-CM | POA: Diagnosis not present

## 2018-09-08 NOTE — Patient Instructions (Addendum)
Medication Instructions:   Your physician recommends that you continue on your current medications as directed. Please refer to the Current Medication list given to you today.  Labwork:  NONE  Testing/Procedures:  NONE  Follow-Up:  Your physician recommends that you schedule a follow-up appointment in: 3 months.  Any Other Special Instructions Will Be Listed Below (If Applicable).  If you need a refill on your cardiac medications before your next appointment, please call your pharmacy. 

## 2018-09-08 NOTE — Progress Notes (Signed)
Virtual Visit via Telephone Note   This visit type was conducted due to national recommendations for restrictions regarding the COVID-19 Pandemic (e.g. social distancing) in an effort to limit this patient's exposure and mitigate transmission in our community.  Due to her co-morbid illnesses, this patient is at least at moderate risk for complications without adequate follow up.  This format is felt to be most appropriate for this patient at this time.  The patient did not have access to video technology/had technical difficulties with video requiring transitioning to audio format only (telephone).  All issues noted in this document were discussed and addressed.  No physical exam could be performed with this format.  Please refer to the patient's chart for her  consent to telehealth for [facility].   Date:  09/08/2018   ID:  [name], DOB 1933/04/16, MRN [id number]  Patient Location: [place] Provider Location: Office  PCP:  Hilbert Corrigan, MD  Cardiologist:  Carlyle Dolly, MD  Electrophysiologist:  Thompson Grayer, MD   Evaluation Performed:  Follow-Up Visit  Chief Complaint:  Leg swelling.   History of Present Illness:    [name] is a 85 y.o. female with seen today for a focused visit on recent issues with leg swelling.     1. Low normal to mildly decreased LV function - 09/2016 echo LVEF 45-50%. Limited visualization, could not eval diastolic function. Severe biatrail enlargement suggests significant diastolic dysfunction   - on torsemide 40mg  in AM and 20mg  in PM. She reports her swelling is improving, breathing is stable.      The patient does not have symptoms concerning for COVID-19 infection (fever, chills, cough, or new shortness of breath).    Past Medical History:  Diagnosis Date  . Chronic lung disease    on home O2   . Complete heart block (HCC)    s/p PPM by Dr Rayann Heman  . Debilitated   . Diastolic  dysfunction   . Hypertension   . Lymphedema of right lower extremity   . MI (mitral incompetence)   . Obesity   . Permanent atrial fibrillation   . Pulmonary hypertension ([place])   . Rheumatoid arthritis(714.0)   . Sleep apnea   . Stroke [facility])    remote  . Venous insufficiency    with chronic leg ulcers   Past Surgical History:  Procedure Laterality Date  . PACEMAKER INSERTION  08/13/10   SJM by Dr Rayann Heman     Current Meds  Medication Sig  . acetaminophen (TYLENOL) 500 MG tablet Take 500 mg by mouth 3 (three) times daily.  . Cholecalciferol (VITAMIN D) 2000 units CAPS Take 1 capsule by mouth daily.  Marland Kitchen donepezil (ARICEPT) 10 MG tablet Take 1 tablet (10 mg total) by mouth at bedtime.  . hydrocortisone (PROCTOZONE-HC) 2.5 % rectal cream Place 1 application rectally 2 (two) times daily.  Marland Kitchen ipratropium-albuterol (DUONEB) 0.5-2.5 (3) MG/3ML SOLN Take 3 mLs by nebulization every 6 (six) hours as needed.  Marland Kitchen KLOR-CON M20 20 MEQ tablet Take 1 tablet by mouth 2 (two) times daily.  Marland Kitchen levothyroxine (SYNTHROID) 75 MCG tablet Take 1 tablet by mouth daily.  . memantine (NAMENDA) 10 MG tablet Take 1 tablet (10 mg total) by mouth 2 (two) times daily.  . Menthol-Zinc Oxide (CALMOSEPTINE) 0.44-20.6 % OINT Apply topically every 8 (eight) hours as needed (apply to buttock).  . metoprolol succinate (TOPROL-XL) 25 MG 24 hr tablet TAKE 1/2 TABLET DAILY  . MISC NATURAL  PRODUCTS EX Apply topically. eucerin cream as needed to legs for dry skin  . Multiple Vitamins-Minerals (MULTIVITAMIN WITH MINERALS) tablet Take 1 tablet by mouth daily.  . OXYGEN-HELIUM IN Inhale 4 L into the lungs continuous. 4L  . Rivaroxaban (XARELTO) 15 MG TABS tablet Take 1 tablet (15 mg total) by mouth daily with supper.  . simvastatin (ZOCOR) 20 MG tablet TAKE ONE (1) TABLET EACH DAY  . torsemide (DEMADEX) 20 MG tablet Take 20-40 mg by mouth as directed. Take 40 mg in the morning and 20 mg in the evening      Allergies:   Patient has  no known allergies.   Social History   Tobacco Use  . Smoking status: Never Smoker  . Smokeless tobacco: Never Used  Substance Use Topics  . Alcohol use: No    Alcohol/week: 0.0 standard drinks  . Drug use: No     Family Hx: The patient's family history includes Alzheimer's disease in her mother; Colon cancer in her mother; Congestive Heart Failure in her father.  ROS:   Please see the history of present illness.     All other systems reviewed and are negative.   Prior CV studies:   The following studies were reviewed today:  04/2014 Echo Study Conclusions  - Left ventricle: The cavity size was normal. Wall thickness was normal. Systolic function was normal. The estimated ejection fraction was in the range of 55% to 60%. Abnormal diastolic function, indeterminate grade. Wall motion was normal; there were no regional wall motion abnormalities. - Aortic valve: Mildly calcified annulus. Trileaflet; mildly thickened leaflets. There was mild stenosis. There was mild to moderate regurgitation. Mean gradient (S): 13 mm Hg. Valve area (VTI): 1.56 cm^2. Valve area (Vmax): 1.45 cm^2. Valve area (Vmean): 1.34 cm^2. Regurgitation pressure half-time: 542 ms. - Mitral valve: Mildly to moderately calcified annulus. Mildly thickened leaflets . There was moderate regurgitation. The MR VC is 0.5 cm. - Left atrium: The atrium was severely dilated. - Right ventricle: The cavity size was mildly dilated. - Right atrium: The atrium was severely dilated. - Tricuspid valve: There are 2 seperate TR jets. TR VC for jet 1 is 4.0 cm, the TR VC for jet 2 is 0.3 cm. The composite of the jets is moderate to severe TR. - Pulmonary arteries: Systolic pressure was moderately increased. PA peak pressure: 57 mm Hg (S). - Inferior vena cava: The vessel was normal in size. The respirophasic diameter changes were in the normal range (>= 50%), consistent with normal central  venous pressure. - Technically adequate study.   09/2016 echo Study Conclusions  - Left ventricle: The cavity size was normal. Wall thickness was normal. Diastolic dysfunction, grade indeterminate. Systolic function was mildly reduced. The estimated ejection fraction was in the range of 45% to 50%. Diffuse hypokinesis. Doppler parameters are consistent with high ventricular filling pressure. - Regional wall motion abnormality: Mild hypokinesis of the apical myocardium. - Ventricular septum: Septal motion showed abnormal function and dyssynergy. These changes are consistent with right ventricular pacing. - Aortic valve: Moderately calcified annulus. Mildly thickened leaflets. There was mild stenosis. There was mild to moderate regurgitation. Peak velocity (S): 225 cm/s. Mean gradient (S): 11 mm Hg. - Mitral valve: Moderately to severely calcified annulus. Mildly thickened leaflets . There was moderate eccentric regurgitation. - Left atrium: The atrium was severely dilated. - Right ventricle: The cavity size was mildly dilated. Pacer wire or catheter noted in right ventricle. Systolic function was mildly reduced. - Right atrium:  The atrium was severely dilated. - Tricuspid valve: There was moderate regurgitation. - Pulmonic valve: There was mild regurgitation. - Pulmonary arteries: Systolic pressure was moderately to severely increased. PA peak pressure: 65 mm Hg (S).  Impressions:  - Systolic function was difficult to assess in all views, but overall appeared mildly reduced, LVEF 45-50%. Consider a repeat limited study with contrast for a more accurate assessment of LVEF and regional wall motion.  Labs/Other Tests and Data Reviewed:    EKG:  No ECG reviewed.  Recent Labs: No results found for requested labs within last 8760 hours.   Recent Lipid Panel Lab Results  Component Value Date/Time   CHOL 194 10/20/2016 11:21 AM   TRIG  109 10/20/2016 11:21 AM   HDL 69 10/20/2016 11:21 AM   CHOLHDL 2.8 10/20/2016 11:21 AM   LDLCALC 103 (H) 10/20/2016 11:21 AM    Wt Readings from Last 3 Encounters:  09/08/18 193 lb (87.5 kg)  07/28/18 189 lb (85.7 kg)  06/30/18 182 lb (82.6 kg)     Objective:    Vital Signs:  BP 106/80   Pulse 68   Resp 14   Ht 4\' 11"  (1.499 m)   Wt 193 lb (87.5 kg)   SpO2 95%   BMI 38.98 kg/m    NOrmal affect. Normal speech pattern and tone. Comfortable no apparent distress. No audible signs of SOB or wheezing.   ASSESSMENT & PLAN:    1.Leg edema - improving with diuretic changes. Weights labile, though she reports and nursing staff at the nursing home report swelling is improving. Continue current diuretic dosing.    COVID-19 Education: The signs and symptoms of COVID-19 were discussed with the patient and how to seek care for testing (follow up with PCP or arrange E-visit).  The importance of social distancing was discussed today.  Time:   Today, I have spent 12 minutes with the patient with telehealth technology discussing the above problems.     Medication Adjustments/Labs and Tests Ordered: Current medicines are reviewed at length with the patient today.  Concerns regarding medicines are outlined above.   Tests Ordered: No orders of the defined types were placed in this encounter.   Medication Changes: No orders of the defined types were placed in this encounter.   Follow Up:  Virtual Visit in 3 month(s)  Signed, Carlyle Dolly, MD  09/08/2018 11:45 AM    [place]

## 2018-09-20 DIAGNOSIS — Z20828 Contact with and (suspected) exposure to other viral communicable diseases: Secondary | ICD-10-CM | POA: Diagnosis not present

## 2018-09-20 DIAGNOSIS — Z1383 Encounter for screening for respiratory disorder NEC: Secondary | ICD-10-CM | POA: Diagnosis not present

## 2018-09-22 ENCOUNTER — Other Ambulatory Visit: Payer: Self-pay

## 2018-09-22 ENCOUNTER — Encounter: Payer: PPO | Admitting: Student

## 2018-09-22 ENCOUNTER — Telehealth: Payer: Self-pay | Admitting: Student

## 2018-09-22 NOTE — Telephone Encounter (Signed)
    [facility] to do the patient's Virtual Visit today and they informed me no one was there that could help with the visit and this would need to be rescheduled.  By review of records the patient had a phone visit with Dr. Harl Bowie on 09/08/2018 and was informed to have a virtual follow-up visit in 3 months but the one for today was not canceled. Will keep scheduled follow-up in 11/2018.  Signed, Erma Heritage, PA-C 09/22/2018, 1:41 PM Pager: [phone]

## 2018-09-22 NOTE — Progress Notes (Signed)
    [facility] to do the patient's Virtual Visit today and they informed me no one was there that could help with the visit and this would need to be rescheduled.  By review of records the patient had a phone visit with Dr. Harl Bowie on 09/08/2018 and was informed to have a virtual follow-up visit in 3 months but the one for today was not canceled. Will keep scheduled follow-up in 11/2018.  Signed, Erma Heritage, PA-C 09/22/2018, 1:41 PM Pager: [phone]

## 2018-09-23 DIAGNOSIS — Z1383 Encounter for screening for respiratory disorder NEC: Secondary | ICD-10-CM | POA: Diagnosis not present

## 2018-09-23 DIAGNOSIS — Z20828 Contact with and (suspected) exposure to other viral communicable diseases: Secondary | ICD-10-CM | POA: Diagnosis not present

## 2018-09-29 DIAGNOSIS — U071 COVID-19: Secondary | ICD-10-CM | POA: Diagnosis not present

## 2018-09-29 DIAGNOSIS — F0390 Unspecified dementia without behavioral disturbance: Secondary | ICD-10-CM | POA: Diagnosis not present

## 2018-09-29 DIAGNOSIS — I4891 Unspecified atrial fibrillation: Secondary | ICD-10-CM | POA: Diagnosis not present

## 2018-09-29 DIAGNOSIS — E785 Hyperlipidemia, unspecified: Secondary | ICD-10-CM | POA: Diagnosis not present

## 2018-09-29 DIAGNOSIS — E039 Hypothyroidism, unspecified: Secondary | ICD-10-CM | POA: Diagnosis not present

## 2018-09-30 DIAGNOSIS — I517 Cardiomegaly: Secondary | ICD-10-CM | POA: Diagnosis not present

## 2018-09-30 DIAGNOSIS — I509 Heart failure, unspecified: Secondary | ICD-10-CM | POA: Diagnosis not present

## 2018-09-30 DIAGNOSIS — J811 Chronic pulmonary edema: Secondary | ICD-10-CM | POA: Diagnosis not present

## 2018-10-01 ENCOUNTER — Encounter (HOSPITAL_COMMUNITY): Payer: Self-pay | Admitting: Emergency Medicine

## 2018-10-01 ENCOUNTER — Inpatient Hospital Stay (HOSPITAL_COMMUNITY)
Admission: EM | Admit: 2018-10-01 | Discharge: 2018-10-28 | DRG: 177 | Disposition: E | Payer: PPO | Source: Skilled Nursing Facility | Attending: Internal Medicine | Admitting: Internal Medicine

## 2018-10-01 ENCOUNTER — Emergency Department (HOSPITAL_COMMUNITY): Payer: PPO

## 2018-10-01 ENCOUNTER — Other Ambulatory Visit: Payer: Self-pay

## 2018-10-01 DIAGNOSIS — R0602 Shortness of breath: Secondary | ICD-10-CM

## 2018-10-01 DIAGNOSIS — Z9981 Dependence on supplemental oxygen: Secondary | ICD-10-CM | POA: Diagnosis not present

## 2018-10-01 DIAGNOSIS — I272 Pulmonary hypertension, unspecified: Secondary | ICD-10-CM | POA: Diagnosis present

## 2018-10-01 DIAGNOSIS — J1289 Other viral pneumonia: Secondary | ICD-10-CM | POA: Diagnosis present

## 2018-10-01 DIAGNOSIS — I361 Nonrheumatic tricuspid (valve) insufficiency: Secondary | ICD-10-CM | POA: Diagnosis not present

## 2018-10-01 DIAGNOSIS — G309 Alzheimer's disease, unspecified: Secondary | ICD-10-CM | POA: Diagnosis present

## 2018-10-01 DIAGNOSIS — I5043 Acute on chronic combined systolic (congestive) and diastolic (congestive) heart failure: Secondary | ICD-10-CM | POA: Diagnosis not present

## 2018-10-01 DIAGNOSIS — I739 Peripheral vascular disease, unspecified: Secondary | ICD-10-CM | POA: Diagnosis not present

## 2018-10-01 DIAGNOSIS — Z950 Presence of cardiac pacemaker: Secondary | ICD-10-CM | POA: Diagnosis present

## 2018-10-01 DIAGNOSIS — Z452 Encounter for adjustment and management of vascular access device: Secondary | ICD-10-CM | POA: Diagnosis not present

## 2018-10-01 DIAGNOSIS — G300 Alzheimer's disease with early onset: Secondary | ICD-10-CM | POA: Diagnosis not present

## 2018-10-01 DIAGNOSIS — Z79890 Hormone replacement therapy (postmenopausal): Secondary | ICD-10-CM

## 2018-10-01 DIAGNOSIS — J9621 Acute and chronic respiratory failure with hypoxia: Secondary | ICD-10-CM | POA: Diagnosis not present

## 2018-10-01 DIAGNOSIS — I442 Atrioventricular block, complete: Secondary | ICD-10-CM | POA: Diagnosis not present

## 2018-10-01 DIAGNOSIS — R404 Transient alteration of awareness: Secondary | ICD-10-CM | POA: Diagnosis not present

## 2018-10-01 DIAGNOSIS — E875 Hyperkalemia: Secondary | ICD-10-CM | POA: Diagnosis present

## 2018-10-01 DIAGNOSIS — I4891 Unspecified atrial fibrillation: Secondary | ICD-10-CM | POA: Diagnosis present

## 2018-10-01 DIAGNOSIS — F0280 Dementia in other diseases classified elsewhere without behavioral disturbance: Secondary | ICD-10-CM | POA: Diagnosis present

## 2018-10-01 DIAGNOSIS — E669 Obesity, unspecified: Secondary | ICD-10-CM | POA: Diagnosis present

## 2018-10-01 DIAGNOSIS — G9341 Metabolic encephalopathy: Secondary | ICD-10-CM | POA: Diagnosis not present

## 2018-10-01 DIAGNOSIS — J1282 Pneumonia due to coronavirus disease 2019: Secondary | ICD-10-CM | POA: Diagnosis present

## 2018-10-01 DIAGNOSIS — I10 Essential (primary) hypertension: Secondary | ICD-10-CM | POA: Diagnosis present

## 2018-10-01 DIAGNOSIS — E038 Other specified hypothyroidism: Secondary | ICD-10-CM | POA: Diagnosis not present

## 2018-10-01 DIAGNOSIS — E870 Hyperosmolality and hypernatremia: Secondary | ICD-10-CM | POA: Diagnosis not present

## 2018-10-01 DIAGNOSIS — J988 Other specified respiratory disorders: Secondary | ICD-10-CM | POA: Diagnosis not present

## 2018-10-01 DIAGNOSIS — G308 Other Alzheimer's disease: Secondary | ICD-10-CM | POA: Diagnosis not present

## 2018-10-01 DIAGNOSIS — I4821 Permanent atrial fibrillation: Secondary | ICD-10-CM | POA: Diagnosis not present

## 2018-10-01 DIAGNOSIS — M069 Rheumatoid arthritis, unspecified: Secondary | ICD-10-CM | POA: Diagnosis not present

## 2018-10-01 DIAGNOSIS — Z515 Encounter for palliative care: Secondary | ICD-10-CM | POA: Diagnosis not present

## 2018-10-01 DIAGNOSIS — Z8673 Personal history of transient ischemic attack (TIA), and cerebral infarction without residual deficits: Secondary | ICD-10-CM

## 2018-10-01 DIAGNOSIS — Z66 Do not resuscitate: Secondary | ICD-10-CM | POA: Diagnosis present

## 2018-10-01 DIAGNOSIS — Z820 Family history of epilepsy and other diseases of the nervous system: Secondary | ICD-10-CM

## 2018-10-01 DIAGNOSIS — Z6835 Body mass index (BMI) 35.0-35.9, adult: Secondary | ICD-10-CM

## 2018-10-01 DIAGNOSIS — N179 Acute kidney failure, unspecified: Secondary | ICD-10-CM | POA: Diagnosis not present

## 2018-10-01 DIAGNOSIS — E039 Hypothyroidism, unspecified: Secondary | ICD-10-CM | POA: Diagnosis not present

## 2018-10-01 DIAGNOSIS — U071 COVID-19: Principal | ICD-10-CM | POA: Diagnosis present

## 2018-10-01 DIAGNOSIS — I340 Nonrheumatic mitral (valve) insufficiency: Secondary | ICD-10-CM | POA: Diagnosis present

## 2018-10-01 DIAGNOSIS — R1310 Dysphagia, unspecified: Secondary | ICD-10-CM | POA: Diagnosis not present

## 2018-10-01 DIAGNOSIS — I130 Hypertensive heart and chronic kidney disease with heart failure and stage 1 through stage 4 chronic kidney disease, or unspecified chronic kidney disease: Secondary | ICD-10-CM | POA: Diagnosis not present

## 2018-10-01 DIAGNOSIS — G4733 Obstructive sleep apnea (adult) (pediatric): Secondary | ICD-10-CM | POA: Diagnosis present

## 2018-10-01 DIAGNOSIS — Z800 Family history of malignant neoplasm of digestive organs: Secondary | ICD-10-CM

## 2018-10-01 DIAGNOSIS — N183 Chronic kidney disease, stage 3 (moderate): Secondary | ICD-10-CM | POA: Diagnosis not present

## 2018-10-01 DIAGNOSIS — I5189 Other ill-defined heart diseases: Secondary | ICD-10-CM

## 2018-10-01 DIAGNOSIS — Z8249 Family history of ischemic heart disease and other diseases of the circulatory system: Secondary | ICD-10-CM

## 2018-10-01 DIAGNOSIS — J440 Chronic obstructive pulmonary disease with acute lower respiratory infection: Secondary | ICD-10-CM | POA: Diagnosis not present

## 2018-10-01 DIAGNOSIS — R0689 Other abnormalities of breathing: Secondary | ICD-10-CM | POA: Diagnosis not present

## 2018-10-01 DIAGNOSIS — Z209 Contact with and (suspected) exposure to unspecified communicable disease: Secondary | ICD-10-CM | POA: Diagnosis not present

## 2018-10-01 DIAGNOSIS — Z7901 Long term (current) use of anticoagulants: Secondary | ICD-10-CM | POA: Diagnosis not present

## 2018-10-01 HISTORY — DX: Do not resuscitate: Z66

## 2018-10-01 LAB — CBC WITH DIFFERENTIAL/PLATELET
Abs Immature Granulocytes: 0.04 10*3/uL (ref 0.00–0.07)
Basophils Absolute: 0 10*3/uL (ref 0.0–0.1)
Basophils Relative: 0 %
Eosinophils Absolute: 0 10*3/uL (ref 0.0–0.5)
Eosinophils Relative: 0 %
HCT: 40.4 % (ref 36.0–46.0)
Hemoglobin: 10.6 g/dL — ABNORMAL LOW (ref 12.0–15.0)
Immature Granulocytes: 1 %
Lymphocytes Relative: 16 %
Lymphs Abs: 1.3 10*3/uL (ref 0.7–4.0)
MCH: 27.1 pg (ref 26.0–34.0)
MCHC: 26.2 g/dL — ABNORMAL LOW (ref 30.0–36.0)
MCV: 103.3 fL — ABNORMAL HIGH (ref 80.0–100.0)
Monocytes Absolute: 0.7 10*3/uL (ref 0.1–1.0)
Monocytes Relative: 9 %
Neutro Abs: 6.2 10*3/uL (ref 1.7–7.7)
Neutrophils Relative %: 74 %
Platelets: 210 10*3/uL (ref 150–400)
RBC: 3.91 MIL/uL (ref 3.87–5.11)
RDW: 16.3 % — ABNORMAL HIGH (ref 11.5–15.5)
WBC: 8.3 10*3/uL (ref 4.0–10.5)
nRBC: 0 % (ref 0.0–0.2)

## 2018-10-01 LAB — D-DIMER, QUANTITATIVE: D-Dimer, Quant: 1.76 ug/mL-FEU — ABNORMAL HIGH (ref 0.00–0.50)

## 2018-10-01 LAB — PROTIME-INR
INR: 1.1 (ref 0.8–1.2)
Prothrombin Time: 14.4 seconds (ref 11.4–15.2)

## 2018-10-01 LAB — URINALYSIS, ROUTINE W REFLEX MICROSCOPIC
Bacteria, UA: NONE SEEN
Bilirubin Urine: NEGATIVE
Glucose, UA: NEGATIVE mg/dL
Ketones, ur: NEGATIVE mg/dL
Leukocytes,Ua: NEGATIVE
Nitrite: NEGATIVE
Protein, ur: NEGATIVE mg/dL
Specific Gravity, Urine: 1.014 (ref 1.005–1.030)
pH: 5 (ref 5.0–8.0)

## 2018-10-01 LAB — COMPREHENSIVE METABOLIC PANEL
ALT: 35 U/L (ref 0–44)
AST: 53 U/L — ABNORMAL HIGH (ref 15–41)
Albumin: 3.4 g/dL — ABNORMAL LOW (ref 3.5–5.0)
Alkaline Phosphatase: 73 U/L (ref 38–126)
Anion gap: 11 (ref 5–15)
BUN: 48 mg/dL — ABNORMAL HIGH (ref 8–23)
CO2: 37 mmol/L — ABNORMAL HIGH (ref 22–32)
Calcium: 8.3 mg/dL — ABNORMAL LOW (ref 8.9–10.3)
Chloride: 98 mmol/L (ref 98–111)
Creatinine, Ser: 1.53 mg/dL — ABNORMAL HIGH (ref 0.44–1.00)
GFR calc Af Amer: 36 mL/min — ABNORMAL LOW (ref >60)
GFR calc non Af Amer: 31 mL/min — ABNORMAL LOW (ref >60)
Glucose, Bld: 83 mg/dL (ref 70–99)
Potassium: 5.6 mmol/L — ABNORMAL HIGH (ref 3.5–5.1)
Sodium: 146 mmol/L — ABNORMAL HIGH (ref 135–145)
Total Bilirubin: 0.4 mg/dL (ref 0.3–1.2)
Total Protein: 6.2 g/dL — ABNORMAL LOW (ref 6.5–8.1)

## 2018-10-01 LAB — LACTATE DEHYDROGENASE: LDH: 198 U/L — ABNORMAL HIGH (ref 98–192)

## 2018-10-01 LAB — BRAIN NATRIURETIC PEPTIDE: B Natriuretic Peptide: 913 pg/mL — ABNORMAL HIGH (ref 0.0–100.0)

## 2018-10-01 LAB — FERRITIN: Ferritin: 46 ng/mL (ref 11–307)

## 2018-10-01 LAB — TRIGLYCERIDES: Triglycerides: 134 mg/dL (ref <150)

## 2018-10-01 LAB — PROCALCITONIN: Procalcitonin: 0.93 ng/mL

## 2018-10-01 LAB — C-REACTIVE PROTEIN: CRP: 2.1 mg/dL — ABNORMAL HIGH (ref <1.0)

## 2018-10-01 LAB — SARS CORONAVIRUS 2 BY RT PCR (HOSPITAL ORDER, PERFORMED IN ~~LOC~~ HOSPITAL LAB): SARS Coronavirus 2: POSITIVE — AB

## 2018-10-01 LAB — LACTIC ACID, PLASMA: Lactic Acid, Venous: 1.2 mmol/L (ref 0.5–1.9)

## 2018-10-01 LAB — TROPONIN I (HIGH SENSITIVITY): Troponin I (High Sensitivity): 141 ng/L (ref <18)

## 2018-10-01 LAB — FIBRINOGEN: Fibrinogen: 296 mg/dL (ref 210–475)

## 2018-10-01 MED ORDER — ALBUTEROL SULFATE (2.5 MG/3ML) 0.083% IN NEBU
2.5000 mg | INHALATION_SOLUTION | RESPIRATORY_TRACT | Status: DC | PRN
  Filled 2018-10-01: qty 3

## 2018-10-01 MED ORDER — FUROSEMIDE 10 MG/ML IJ SOLN
60.0000 mg | Freq: Two times a day (BID) | INTRAMUSCULAR | Status: DC
  Administered 2018-10-01 – 2018-10-02 (×3): 60 mg via INTRAVENOUS
  Filled 2018-10-01 (×3): qty 6

## 2018-10-01 MED ORDER — ALBUTEROL SULFATE (2.5 MG/3ML) 0.083% IN NEBU
2.5000 mg | INHALATION_SOLUTION | Freq: Four times a day (QID) | RESPIRATORY_TRACT | Status: DC
  Filled 2018-10-01 (×2): qty 3

## 2018-10-01 MED ORDER — SODIUM CHLORIDE 0.9 % IV SOLN: INTRAVENOUS | Status: DC

## 2018-10-01 MED ORDER — DEXAMETHASONE SODIUM PHOSPHATE 10 MG/ML IJ SOLN
6.0000 mg | INTRAMUSCULAR | Status: DC
  Administered 2018-10-01 – 2018-10-04 (×4): 6 mg via INTRAVENOUS
  Filled 2018-10-01 (×3): qty 1

## 2018-10-01 MED ORDER — IPRATROPIUM-ALBUTEROL 0.5-2.5 (3) MG/3ML IN SOLN: 3.0000 mL | Freq: Once | RESPIRATORY_TRACT | Status: DC

## 2018-10-01 MED ORDER — METHYLPREDNISOLONE SODIUM SUCC 125 MG IJ SOLR: 45.0000 mg | Freq: Two times a day (BID) | INTRAMUSCULAR | Status: DC

## 2018-10-01 MED ORDER — IPRATROPIUM BROMIDE 0.02 % IN SOLN
2.5000 mL | Freq: Four times a day (QID) | RESPIRATORY_TRACT | Status: DC
  Filled 2018-10-01 (×2): qty 2.5

## 2018-10-01 MED ORDER — ALBUTEROL SULFATE (2.5 MG/3ML) 0.083% IN NEBU: 2.5000 mg | INHALATION_SOLUTION | Freq: Once | RESPIRATORY_TRACT | Status: DC

## 2018-10-01 MED ORDER — FUROSEMIDE 10 MG/ML IJ SOLN: 80.0000 mg | Freq: Two times a day (BID) | INTRAMUSCULAR | Status: DC

## 2018-10-01 NOTE — ED Notes (Signed)
Lab at bedside

## 2018-10-01 NOTE — ED Notes (Addendum)
Updated pt's daughter (&POA) [name] at [phone].

## 2018-10-01 NOTE — ED Notes (Signed)
Pt noted to have random period of increased WOB, sats dropped to 86% on 4L. Increased supplemental oxygen to 6L. Sats increased to 99%.

## 2018-10-01 NOTE — ED Notes (Signed)
Patient still holding waiting for bed or transport. Still holding neb due to limbo for CoVID. Patient is positive. She has been holding since am.

## 2018-10-01 NOTE — ED Notes (Signed)
This RN & two phlebotomists attempted to draw labs with no success. Lab will send another phlebotomists. EDP notified.

## 2018-10-01 NOTE — ED Triage Notes (Signed)
Per EMS, pt from [facility]. Pt tested positive for COVID on Sunday. Facility reported this morning, pt having increased WOB, including tachypnea and accessory muscle use. Pt 96-97% on 4L nasal cannula which she is always on. Pt hx of dementia. Pt is DNR.

## 2018-10-01 NOTE — ED Provider Notes (Signed)
[facility] EMERGENCY DEPARTMENT Provider Note   CSN: [id number] Arrival date & time: 10/16/2018  [id number]     History   Chief Complaint Chief Complaint  Patient presents with  . Shortness of Breath    HPI [name] is a 85 y.o. female.     The history is provided by the EMS personnel, the nursing home and the patient. The history is limited by the condition of the patient (Hx dementia).  Shortness of Breath Pt was seen at 0955. Per EMS, NH report and pt: Pt apparently c/o increasing SOB for the past 1 week. Pt +COVID testing at facility. Pt's O2 Sats dropping to 80's despite chronic home O2 N/C. Pt has significant hx of dementia and is DNR.   Past Medical History:  Diagnosis Date  . Chronic lung disease    on home O2   . Complete heart block (HCC)    s/p PPM by Dr Rayann Heman  . Debilitated   . Diastolic dysfunction   . DNR (do not resuscitate)   . Hypertension   . Lymphedema of right lower extremity   . MI (mitral incompetence)   . Obesity   . Permanent atrial fibrillation   . Pulmonary hypertension ([place])   . Rheumatoid arthritis(714.0)   . Sleep apnea   . Stroke [facility])    remote  . Venous insufficiency    with chronic leg ulcers    Patient Active Problem List   Diagnosis Date Noted  . PVD (peripheral vascular disease) ([place]) 05/28/2016  . Atherosclerosis of native artery of both lower extremities with bilateral ulceration ([place]) 05/28/2016  . Alzheimer's disease ([place]) 10/11/2014  . Essential hypertension 10/11/2014  . Cardiomyopathy, dilated ([place]) 07/24/2014  . Apnea, sleep 07/24/2014  . Anemia 04/28/2014  . Chronic hypoxemic respiratory failure ([place]) 04/12/2014  . Chronic ulcer of leg ([place]) 04/12/2014  . Lymphedema of leg 04/12/2014  . Disorder of nutrition 04/12/2014  . Hypothyroidism 03/24/2014  . Stasis dermatitis of both legs 05/06/2013  . Pacemaker-St.Jude 11/25/2011  . Complete heart block ([place]) 12/12/2010  . Permanent atrial fibrillation 12/12/2010   . Diastolic dysfunction [id number]  . Venous insufficiency 12/12/2010  . Long term current use of anticoagulant 09/30/2002    Past Surgical History:  Procedure Laterality Date  . PACEMAKER INSERTION  08/13/10   SJM by Dr Rayann Heman     OB History   No obstetric history on file.      Home Medications    Prior to Admission medications   Medication Sig Start Date End Date Taking? Authorizing Provider  acetaminophen (TYLENOL) 500 MG tablet Take 500 mg by mouth 3 (three) times daily.   Yes [provider]  Cholecalciferol (VITAMIN D) 2000 units CAPS Take 1 capsule by mouth daily.    [provider]  donepezil (ARICEPT) 10 MG tablet Take 1 tablet (10 mg total) by mouth at bedtime. 02/10/17   [name], MD  hydrocortisone (PROCTOZONE-HC) 2.5 % rectal cream Place 1 application rectally 2 (two) times daily. 12/10/15   Hassell Done, Mary-Margaret, FNP  ipratropium-albuterol (DUONEB) 0.5-2.5 (3) MG/3ML SOLN Take 3 mLs by nebulization every 6 (six) hours as needed.    [provider]  KLOR-CON M20 20 MEQ tablet Take 1 tablet by mouth 2 (two) times daily. 08/29/18   [provider]  levothyroxine (SYNTHROID) 75 MCG tablet Take 1 tablet by mouth daily. 08/31/18   [provider]  memantine (NAMENDA) 10 MG tablet Take 1 tablet (10 mg total) by mouth  2 (two) times daily. 02/10/17   [name], MD  Menthol-Zinc Oxide (CALMOSEPTINE) 0.44-20.6 % OINT Apply topically every 8 (eight) hours as needed (apply to buttock).    [provider]  metoprolol succinate (TOPROL-XL) 25 MG 24 hr tablet TAKE 1/2 TABLET DAILY 06/16/17   Arnoldo Lenis, MD  MISC NATURAL PRODUCTS EX Apply topically. eucerin cream as needed to legs for dry skin    [provider]  Multiple Vitamins-Minerals (MULTIVITAMIN WITH MINERALS) tablet Take 1 tablet by mouth daily.    [provider]  OXYGEN-HELIUM IN Inhale 4 L into the lungs continuous. 4L    [provider]  Rivaroxaban (XARELTO) 15 MG TABS tablet Take 1 tablet (15 mg total) by mouth daily with supper. 12/25/16   Arnoldo Lenis, MD  simvastatin (ZOCOR) 20 MG tablet TAKE ONE (1) TABLET EACH DAY 02/10/17   [name], MD  torsemide (DEMADEX) 20 MG tablet Take 20-40 mg by mouth as directed. Take 40 mg in the morning and 20 mg in the evening     [provider]    Family History Family History  Problem Relation Age of Onset  . Colon cancer Mother   . Alzheimer's disease Mother   . Congestive Heart Failure Father     Social History Social History   Tobacco Use  . Smoking status: Never Smoker  . Smokeless tobacco: Never Used  Substance Use Topics  . Alcohol use: No    Alcohol/week: 0.0 standard drinks  . Drug use: No     Allergies   Patient has no known allergies.   Review of Systems Review of Systems  Unable to perform ROS: Dementia  Respiratory: Positive for shortness of breath.      Physical Exam Updated Vital Signs BP 105/69 (BP Location: Left Arm)   Pulse 95   Temp 98.3 F (36.8 C) (Oral)   Resp (!) 31   Ht 4\' 11"  (1.499 m)   Wt 90.7 kg   SpO2 95%   BMI 40.40 kg/m    Patient Vitals for the past 24 hrs:  BP Temp Temp src Pulse Resp SpO2 Height Weight  10/27/2018 1130 105/69 - - 95 (!) 31 95 % - -  10/23/2018 1005 112/87 98.3 F (36.8 C) Oral 93 (!) 26 97 % - -  10/21/2018 1004 (!) 107/58 - - (!) 101 (!) 33 100 % - -  10/16/2018 0946 - - - - - 99 % - -  10/15/2018 [id number] - - - - - - 4\' 11"  (1.499 m) 90.7 kg     Physical Exam 1000: Physical examination:  Nursing notes reviewed; Vital signs and O2 SAT reviewed;  Constitutional: Well developed, Well nourished, In no acute distress; Head:  Normocephalic, atraumatic; Eyes: EOMI, PERRL, No scleral icterus; ENMT: Mouth and pharynx normal, Mucous membranes dry; Neck: Supple, Full range of motion, No lymphadenopathy; Cardiovascular: Irregular rate and rhythm, No gallop; Respiratory: Breath sounds  clear & equal bilaterally, No rales, rhonchi, wheezes.  Speaking sentences, Normal respiratory effort/excursion. Sitting upright, tachypneic.; Chest: Nontender, Movement normal; Abdomen: Soft, Nontender, Nondistended, Normal bowel sounds; Genitourinary: No CVA tenderness; Extremities: Peripheral pulses normal, No tenderness, No edema, No calf edema or asymmetry.; Neuro: Awake, alert, confused per hx dementia. No facial droop.  Speech clear. No gross focal motor deficits in extremities.; Skin: Color normal, Warm, Dry.   ED Treatments / Results  Labs (all labs ordered are listed, but only abnormal results are displayed)   EKG  EKG Interpretation  Date/Time:  Friday October 01 2018 09:43:55 EDT Ventricular Rate:  91 PR Interval:    QRS Duration: 192 QT Interval:  439 QTC Calculation: 541 R Axis:   -131 Text Interpretation:  Atrial fibrillation Probable left ventricular hypertrophy Left bundle branch block Left axis deviation Prolonged QT interval Baseline wander When compared with ECG of 04/04/2016 no pacer spike is present Confirmed by Francine Graven [phone]) on 10/21/2018 11:38:41 AM   Radiology   Procedures Procedures (including critical care time)  Medications Ordered in ED Medications  ipratropium-albuterol (DUONEB) 0.5-2.5 (3) MG/3ML nebulizer solution 3 mL (3 mLs Nebulization Not Given 09/28/2018 1125)  albuterol (PROVENTIL) (2.5 MG/3ML) 0.083% nebulizer solution 2.5 mg (2.5 mg Nebulization Not Given 10/23/2018 1124)     Initial Impression / Assessment and Plan / ED Course  I have reviewed the triage vital signs and the nursing notes.  Pertinent labs & imaging results that were available during my care of the patient were reviewed by me and considered in my medical decision making (see chart for details).     MDM Reviewed: previous chart, nursing note and vitals Reviewed previous: labs and ECG Interpretation: ECG and x-ray    Results for orders placed or performed during  the hospital encounter of 10/04/2018  Culture, blood (routine x 2)   Specimen: Left Antecubital; Blood  Result Value Ref Range   Specimen Description LEFT ANTECUBITAL BOTTLES DRAWN AEROBIC ONLY    Special Requests      Blood Culture adequate volume Performed at [facility], [address] [place], [place] [address]    Culture PENDING    Report Status PENDING   SARS Coronavirus 2 [facility] order, Performed in [facility] hospital lab) Nasopharyngeal Nasopharyngeal Swab   Specimen: Nasopharyngeal Swab  Result Value Ref Range   SARS Coronavirus 2 POSITIVE (A) NEGATIVE   Dg Chest Port 1 View Result Date: 10/03/2018 CLINICAL DATA:  Short of breath.  COVID-19 positive EXAM: PORTABLE CHEST 1 VIEW COMPARISON:  05/11/2017 FINDINGS: Marked cardiac enlargement.  Single lead pacemaker unchanged. Interval development of diffuse bilateral airspace disease right greater than left. Small pleural effusions and bibasilar atelectasis. IMPRESSION: Interval development of diffuse bilateral airspace disease. This could represent congestive heart failure and edema versus pneumonia. Electronically Signed   By: Franchot Gallo M.D.   On: 10/20/2018 11:07    [name] was evaluated in Emergency Department on 10/13/2018 for the symptoms described in the history of present illness. She was evaluated in the context of the global COVID-19 pandemic, which necessitated consideration that the patient might be at risk for infection with the SARS-CoV-2 virus that causes COVID-19. Institutional protocols and algorithms that pertain to the evaluation of patients at risk for COVID-19 are in a state of rapid change based on information released by regulatory bodies including the CDC and federal and state organizations. These policies and algorithms were followed during the patient's care in the ED.   1505:  Labs pending. Will need admit. Sign out to Dr. Alvino Chapel.     Final Clinical Impressions(s) / ED Diagnoses   Final  diagnoses:  None    ED Discharge Orders    None       Francine Graven, DO 10/04/2018 1508

## 2018-10-01 NOTE — H&P (Addendum)
History and Physical  [name] [id number] DOB: Nov 14, 1933 DOA: 10/04/2018  Referring physician: Dr Alvino Chapel, ED physician PCP: Hilbert Corrigan, MD  Outpatient Specialists:   [facility] Coming From[facility]  Chief Complaint: Shortness of breath  HPI: [name] is a 85 y.o. female with a history of chronic respiratory failure, Alzheimer's, permanent atrial fibrillation on Xarelto, history of systolic and diastolic heart failure, hypertension, sleep apnea, history of stroke, pulmonary hypertension.  Patient seen for worsening shortness of breath over the past 24 hours.  She had been diagnosed with COVID on Sunday.  Is uncertain as to why she got that test.  She has been having worsening work of breathing with tachypnea and accessory muscle use.  She is on oxygen on a chronic basis and is at 4 L nasal cannula.  Patient unable to provide any history.  Emergency Department Course: COVID positive.  BNP elevated.  Chest x-ray shows infiltrates versus pulmonary edema.  She does have swelling of her upper extremities.  Review of Systems:   Unable to obtain secondary to patient's Alzheimer's  Past Medical History:  Diagnosis Date   Chronic lung disease    on home O2    Complete heart block (HCC)    s/p PPM by Dr Rayann Heman   Debilitated    Diastolic dysfunction    DNR (do not resuscitate)    Hypertension    Lymphedema of right lower extremity    MI (mitral incompetence)    Obesity    Permanent atrial fibrillation    Pulmonary hypertension (HCC)    Rheumatoid arthritis(714.0)    Sleep apnea    Stroke [facility])    remote   Venous insufficiency    with chronic leg ulcers   Past Surgical History:  Procedure Laterality Date   PACEMAKER INSERTION  08/13/10   SJM by Dr Rayann Heman   Social History:  reports that she has never smoked. She has never used smokeless tobacco. She reports that she does not drink alcohol or use drugs. Patient  [facility]  No Known Allergies  Family History  Problem Relation Age of Onset   Colon cancer Mother    Alzheimer's disease Mother    Congestive Heart Failure Father       Prior to Admission medications   Medication Sig Start Date End Date Taking? Authorizing Provider  acetaminophen (TYLENOL) 500 MG tablet Take 500 mg by mouth 3 (three) times daily.   Yes [provider]  Cholecalciferol (VITAMIN D) 2000 units CAPS Take 1 capsule by mouth daily.   Yes [provider]  donepezil (ARICEPT) 10 MG tablet Take 1 tablet (10 mg total) by mouth at bedtime. 02/10/17  Yes Stacks, Cletus Gash, MD  hydrocortisone (PROCTOZONE-HC) 2.5 % rectal cream Place 1 application rectally 2 (two) times daily. 12/10/15  Yes Martin, Mary-Margaret, FNP  KLOR-CON M20 20 MEQ tablet Take 20 mEq by mouth 2 (two) times daily.  08/29/18  Yes [provider]  levothyroxine (SYNTHROID) 75 MCG tablet Take 1 tablet by mouth daily. 08/31/18  Yes [provider]  memantine (NAMENDA) 10 MG tablet Take 1 tablet (10 mg total) by mouth 2 (two) times daily. 02/10/17  Yes Stacks, Cletus Gash, MD  Menthol-Zinc Oxide (CALMOSEPTINE) 0.44-20.6 % OINT Apply 1 application topically 3 (three) times daily. APPLIED EVERY SHIFT TO BUTTOCKS FOR PROTECTION   Yes [provider]  metoprolol succinate (TOPROL-XL) 25 MG 24 hr tablet TAKE 1/2 TABLET DAILY Patient taking differently:  Take 12.5 mg by mouth daily.  06/16/17  Yes BranchAlphonse Guild, MD  Multiple Vitamins-Minerals (MULTIVITAMIN WITH MINERALS) tablet Take 1 tablet by mouth daily.   Yes [provider]  Rivaroxaban (XARELTO) 15 MG TABS tablet Take 1 tablet (15 mg total) by mouth daily with supper. 12/25/16  Yes Branch, Alphonse Guild, MD  simvastatin (ZOCOR) 20 MG tablet TAKE ONE (1) TABLET EACH DAY Patient taking differently: Take 20 mg by mouth every evening. TAKE ONE (1) TABLET EACH DAY 02/10/17  Yes [name], MD  Skin Protectants, Misc.  (MINERIN CREME EX) Apply 1 application topically 2 (two) times daily.   Yes [provider]  torsemide (DEMADEX) 20 MG tablet Take 20-40 mg by mouth See admin instructions. Take 40 mg in the morning and 20 mg in the evening    Yes [provider]    Physical Exam: BP (!) 99/59    Pulse 83    Temp 98.3 F (36.8 C) (Oral)    Resp (!) 25    Ht 4\' 11"  (1.499 m)    Wt 90.7 kg    SpO2 99%    BMI 40.40 kg/m    General: Elderly female. Awake and alert and oriented to person. No acute cardiopulmonary distress.   HEENT: Normocephalic atraumatic.  Right and left ears normal in appearance.  Pupils equal, round, reactive to light. Extraocular muscles are intact. Sclerae anicteric and noninjected.  Moist mucosal membranes. No mucosal lesions.   Neck: Neck supple without lymphadenopathy. No carotid bruits. No masses palpated.   Cardiovascular: Irregularly irregular rate.  No murmurs, rubs, gallops auscultated.  Increased JVD.  Edema is in her upper extremities bilaterally.  Chronic venous stasis changes in her lower extremities.  Respiratory: Diffuse rales.  Patient tachypneic.  No accessory muscle use.  Abdomen: Soft, nontender, nondistended. Active bowel sounds. No masses or hepatosplenomegaly   Skin: No rashes, lesions, or ulcerations.  Dry, warm to touch. 2+ dorsalis pedis and radial pulses.  Musculoskeletal: No calf or leg pain. All major joints not erythematous nontender.  No upper or lower joint deformation.  Good ROM.  No contractures   Psychiatric: lacks judgment and insight. Pleasant and cooperative.  Neurologic: No focal neurological deficits. Strength is 5/5 and symmetric in upper and lower extremities.  Cranial nerves II through XII are grossly intact.           Labs on Admission: I have personally reviewed following labs and imaging studies  CBC: Recent Labs  Lab 10/27/2018 1507  WBC 8.3  NEUTROABS 6.2  HGB 10.6*  HCT 40.4  MCV 103.3*  PLT [id number]   Basic  Metabolic Panel: Recent Labs  Lab 10/12/2018 1507  NA 146*  K 5.6*  CL 98  CO2 37*  GLUCOSE 83  BUN 48*  CREATININE 1.53*  CALCIUM 8.3*   GFR: Estimated Creatinine Clearance: 27.4 mL/min (A) (by C-G formula based on SCr of 1.53 mg/dL (H)). Liver Function Tests: Recent Labs  Lab 10/27/2018 1507  AST 53*  ALT 35  ALKPHOS 73  BILITOT 0.4  PROT 6.2*  ALBUMIN 3.4*   No results for input(s): LIPASE, AMYLASE in the last 168 hours. No results for input(s): AMMONIA in the last 168 hours. Coagulation Profile: Recent Labs  Lab 10/03/2018 1507  INR 1.1   Cardiac Enzymes: No results for input(s): CKTOTAL, CKMB, CKMBINDEX, TROPONINI in the last 168 hours. BNP (last 3 results) No results for input(s): PROBNP in the last 8760 hours. HbA1C: No results for  input(s): HGBA1C in the last 72 hours. CBG: No results for input(s): GLUCAP in the last 168 hours. Lipid Profile: Recent Labs    10/06/2018 1507  TRIG 134   Thyroid Function Tests: No results for input(s): TSH, T4TOTAL, FREET4, T3FREE, THYROIDAB in the last 72 hours. Anemia Panel: Recent Labs    10/04/2018 1507  FERRITIN 46   Urine analysis:    Component Value Date/Time   COLORURINE YELLOW 10/18/2018 1019   APPEARANCEUR CLEAR 10/27/2018 1019   APPEARANCEUR Clear 06/07/2015 1039   LABSPEC 1.014 10/16/2018 1019   PHURINE 5.0 10/21/2018 1019   GLUCOSEU NEGATIVE 10/16/2018 1019   HGBUR SMALL (A) 10/19/2018 1019   BILIRUBINUR NEGATIVE 10/04/2018 1019   BILIRUBINUR Negative 06/07/2015 1039   KETONESUR NEGATIVE 10/19/2018 1019   PROTEINUR NEGATIVE 10/21/2018 1019   UROBILINOGEN 0.2 08/12/2010 1605   NITRITE NEGATIVE 10/13/2018 1019   LEUKOCYTESUR NEGATIVE 10/15/2018 1019   Sepsis Labs: @LABRCNTIP (procalcitonin:4,lacticidven:4) ) Recent Results (from the past 240 hour(s))  SARS Coronavirus 2 [facility] order, Performed in [facility] hospital lab) Nasopharyngeal Nasopharyngeal Swab     Status: Abnormal   Collection Time:  10/02/2018 10:19 AM   Specimen: Nasopharyngeal Swab  Result Value Ref Range Status   SARS Coronavirus 2 POSITIVE (A) NEGATIVE Final    Comment: RESULT CALLED TO, READ BACK BY AND VERIFIED WITH: C.KEMP AT 1229 ON [id number] BY THOMPSON S. Performed at [facility], [address], [place], [place] [address]   Culture, blood (routine x 2)     Status: None (Preliminary result)   Collection Time: 10/08/2018 11:39 AM   Specimen: Vein; Blood  Result Value Ref Range Status   Specimen Description LEFT ANTECUBITAL BOTTLES DRAWN AEROBIC ONLY  Final   Special Requests   Final    Blood Culture adequate volume Performed at [facility], [address], [place], [place] [address]    Culture PENDING  Incomplete   Report Status PENDING  Incomplete  Culture, blood (routine x 2)     Status: None (Preliminary result)   Collection Time: 10/20/2018  3:07 PM   Specimen: BLOOD RIGHT ARM  Result Value Ref Range Status   Specimen Description BLOOD RIGHT ARM  Final   Special Requests   Final    BOTTLES DRAWN AEROBIC AND ANAEROBIC Blood Culture adequate volume Performed at [facility], [address], [place], [place] [address]    Culture PENDING  Incomplete   Report Status PENDING  Incomplete     Radiological Exams on Admission: Dg Chest Port 1 View  Result Date: 10/20/2018 CLINICAL DATA:  Short of breath.  COVID-19 positive EXAM: PORTABLE CHEST 1 VIEW COMPARISON:  05/11/2017 FINDINGS: Marked cardiac enlargement.  Single lead pacemaker unchanged. Interval development of diffuse bilateral airspace disease right greater than left. Small pleural effusions and bibasilar atelectasis. IMPRESSION: Interval development of diffuse bilateral airspace disease. This could represent congestive heart failure and edema versus pneumonia. Electronically Signed   By: Franchot Gallo M.D.   On: 10/27/2018 11:07    EKG: Independently reviewed.  Atrial fibrillation with LVH, left axis deviation, left bundle branch block.  No acute ST  changes.  Assessment/Plan: Principal Problem:   Acute on chronic respiratory failure with hypoxia (HCC) Active Problems:   Permanent atrial fibrillation   Diastolic dysfunction   Pacemaker-St.Jude   Hypothyroidism   Long term current use of anticoagulant   Alzheimer's disease ([place])   Essential hypertension    This patient was discussed with the ED physician, including pertinent vitals, physical  exam findings, labs, and imaging.  We also discussed care given by the ED provider.  1. Acute on chronic respiratory failure with hypoxia. a. Admit b. Continue nasal cannula 2. COVID pneumonia a. I discussed patient's condition with her daughter, [name].  Discussed use of steroids, Remdesivir.  Discussed monitoring of labs b. With the use of Remdesivir, I discussed that this is an experimental medication with no certain indication for COVID.  I discussed that this is being used to help treat COVID and has been appearing to shorten severity and duration of the illness. c. Will monitor LFTs, creatinine, LDH, CRP, CBC, d-dimer 3. Acute on chronic combined systolic and diastolic heart failure Telemetry monitoring Strict I/O Daily Weights Diuresis: Lasix 60 mg twice daily Potassium: Hold for now as patient slightly hyperkalemic Echo cardiac exam tomorrow Repeat BMP tomorrow 4. Alzheimer's a. Continue Namenda 5. Chronic anticoagulation a. Continue Xarelto for A. fib 6. Atrial fibrillation a. On Xarelto 7. Hypertension a. Patient's blood pressure is a little soft.  Will hold beta-blocker and continue with diuresis 8. Hypothyroidism a. Synthroid 9. Pacemaker  DVT prophylaxis: On Xarelto Consultants: None Code Status: DNR -confirmed with patient's daughter Family Communication: [name], who is patient's POA.  Her numbers in the demographics Disposition Plan: Pending   Truett Mainland, DO

## 2018-10-01 NOTE — ED Notes (Signed)
Date and time results received: 10/04/2018 3:56 PM  (use smartphrase ".now" to insert current time)  Test: Troponin Critical Value: 141  Name of Provider Notified: Alvino Chapel  Orders Received? Or Actions Taken?: Orders Received - See Orders for details

## 2018-10-01 NOTE — ED Provider Notes (Addendum)
  Physical Exam  BP (!) 102/58 (BP Location: Left Arm)   Pulse 85   Temp 98.3 F (36.8 C) (Oral)   Resp (!) 32   Ht 4\' 11"  (1.499 m)   Wt 90.7 kg   SpO2 95%   BMI 40.40 kg/m   Physical Exam  ED Course/Procedures     Procedures  MDM  Received patient in signout.  Has COVID-19 infection.  Dyspneic and hypoxia.  Requires increasing oxygen compared to home O2..  Lab work reviewed.  Troponin mildly elevated.  Will discuss with hospitalist patient is a DNR.       Davonna Belling, MD 10/09/2018 [id number]    Davonna Belling, MD 10/13/2018 [phone]

## 2018-10-01 NOTE — ED Notes (Signed)
Received permission from [place] (pt's daughter), to transfer to [facility].

## 2018-10-01 NOTE — Progress Notes (Signed)
RT unable to give neb treatment due to patient COVID positive. RT will continue to monitor.

## 2018-10-01 NOTE — ED Notes (Signed)
Attempted to update pt's daughter on disposition with no response. Left message to return call.

## 2018-10-02 ENCOUNTER — Inpatient Hospital Stay: Payer: Self-pay

## 2018-10-02 ENCOUNTER — Inpatient Hospital Stay (HOSPITAL_COMMUNITY): Payer: PPO

## 2018-10-02 DIAGNOSIS — I442 Atrioventricular block, complete: Secondary | ICD-10-CM | POA: Diagnosis not present

## 2018-10-02 DIAGNOSIS — I10 Essential (primary) hypertension: Secondary | ICD-10-CM

## 2018-10-02 DIAGNOSIS — I5043 Acute on chronic combined systolic (congestive) and diastolic (congestive) heart failure: Secondary | ICD-10-CM | POA: Diagnosis not present

## 2018-10-02 DIAGNOSIS — E039 Hypothyroidism, unspecified: Secondary | ICD-10-CM | POA: Diagnosis not present

## 2018-10-02 DIAGNOSIS — U071 COVID-19: Principal | ICD-10-CM

## 2018-10-02 DIAGNOSIS — I4821 Permanent atrial fibrillation: Secondary | ICD-10-CM

## 2018-10-02 DIAGNOSIS — Z9981 Dependence on supplemental oxygen: Secondary | ICD-10-CM | POA: Diagnosis not present

## 2018-10-02 DIAGNOSIS — Z7901 Long term (current) use of anticoagulants: Secondary | ICD-10-CM | POA: Diagnosis not present

## 2018-10-02 DIAGNOSIS — Z515 Encounter for palliative care: Secondary | ICD-10-CM | POA: Diagnosis not present

## 2018-10-02 DIAGNOSIS — E870 Hyperosmolality and hypernatremia: Secondary | ICD-10-CM | POA: Diagnosis not present

## 2018-10-02 DIAGNOSIS — Z8673 Personal history of transient ischemic attack (TIA), and cerebral infarction without residual deficits: Secondary | ICD-10-CM | POA: Diagnosis not present

## 2018-10-02 DIAGNOSIS — I361 Nonrheumatic tricuspid (valve) insufficiency: Secondary | ICD-10-CM

## 2018-10-02 DIAGNOSIS — G308 Other Alzheimer's disease: Secondary | ICD-10-CM

## 2018-10-02 DIAGNOSIS — J9621 Acute and chronic respiratory failure with hypoxia: Secondary | ICD-10-CM | POA: Diagnosis not present

## 2018-10-02 DIAGNOSIS — Z66 Do not resuscitate: Secondary | ICD-10-CM | POA: Diagnosis not present

## 2018-10-02 DIAGNOSIS — Z950 Presence of cardiac pacemaker: Secondary | ICD-10-CM | POA: Diagnosis not present

## 2018-10-02 DIAGNOSIS — G9341 Metabolic encephalopathy: Secondary | ICD-10-CM | POA: Diagnosis not present

## 2018-10-02 DIAGNOSIS — J988 Other specified respiratory disorders: Secondary | ICD-10-CM

## 2018-10-02 DIAGNOSIS — I130 Hypertensive heart and chronic kidney disease with heart failure and stage 1 through stage 4 chronic kidney disease, or unspecified chronic kidney disease: Secondary | ICD-10-CM | POA: Diagnosis not present

## 2018-10-02 DIAGNOSIS — J1289 Other viral pneumonia: Secondary | ICD-10-CM

## 2018-10-02 DIAGNOSIS — M069 Rheumatoid arthritis, unspecified: Secondary | ICD-10-CM | POA: Diagnosis not present

## 2018-10-02 DIAGNOSIS — E038 Other specified hypothyroidism: Secondary | ICD-10-CM

## 2018-10-02 DIAGNOSIS — N183 Chronic kidney disease, stage 3 (moderate): Secondary | ICD-10-CM | POA: Diagnosis not present

## 2018-10-02 DIAGNOSIS — F0280 Dementia in other diseases classified elsewhere without behavioral disturbance: Secondary | ICD-10-CM | POA: Diagnosis not present

## 2018-10-02 DIAGNOSIS — N179 Acute kidney failure, unspecified: Secondary | ICD-10-CM | POA: Diagnosis not present

## 2018-10-02 DIAGNOSIS — I340 Nonrheumatic mitral (valve) insufficiency: Secondary | ICD-10-CM

## 2018-10-02 DIAGNOSIS — J440 Chronic obstructive pulmonary disease with acute lower respiratory infection: Secondary | ICD-10-CM | POA: Diagnosis not present

## 2018-10-02 DIAGNOSIS — I739 Peripheral vascular disease, unspecified: Secondary | ICD-10-CM | POA: Diagnosis not present

## 2018-10-02 DIAGNOSIS — G309 Alzheimer's disease, unspecified: Secondary | ICD-10-CM | POA: Diagnosis not present

## 2018-10-02 LAB — CBC WITH DIFFERENTIAL/PLATELET
Abs Immature Granulocytes: 0.03 10*3/uL (ref 0.00–0.07)
Basophils Absolute: 0 10*3/uL (ref 0.0–0.1)
Basophils Relative: 0 %
Eosinophils Absolute: 0 10*3/uL (ref 0.0–0.5)
Eosinophils Relative: 0 %
HCT: 41.5 % (ref 36.0–46.0)
Hemoglobin: 11 g/dL — ABNORMAL LOW (ref 12.0–15.0)
Immature Granulocytes: 0 %
Lymphocytes Relative: 4 %
Lymphs Abs: 0.3 10*3/uL — ABNORMAL LOW (ref 0.7–4.0)
MCH: 27.6 pg (ref 26.0–34.0)
MCHC: 26.5 g/dL — ABNORMAL LOW (ref 30.0–36.0)
MCV: 104 fL — ABNORMAL HIGH (ref 80.0–100.0)
Monocytes Absolute: 0.2 10*3/uL (ref 0.1–1.0)
Monocytes Relative: 2 %
Neutro Abs: 7 10*3/uL (ref 1.7–7.7)
Neutrophils Relative %: 94 %
Platelets: 196 10*3/uL (ref 150–400)
RBC: 3.99 MIL/uL (ref 3.87–5.11)
RDW: 16.1 % — ABNORMAL HIGH (ref 11.5–15.5)
WBC: 7.5 10*3/uL (ref 4.0–10.5)
nRBC: 0 % (ref 0.0–0.2)

## 2018-10-02 LAB — BASIC METABOLIC PANEL
Anion gap: 16 — ABNORMAL HIGH (ref 5–15)
BUN: 58 mg/dL — ABNORMAL HIGH (ref 8–23)
CO2: 37 mmol/L — ABNORMAL HIGH (ref 22–32)
Calcium: 8.7 mg/dL — ABNORMAL LOW (ref 8.9–10.3)
Chloride: 97 mmol/L — ABNORMAL LOW (ref 98–111)
Creatinine, Ser: 1.62 mg/dL — ABNORMAL HIGH (ref 0.44–1.00)
GFR calc Af Amer: 34 mL/min — ABNORMAL LOW (ref >60)
GFR calc non Af Amer: 29 mL/min — ABNORMAL LOW (ref >60)
Glucose, Bld: 98 mg/dL (ref 70–99)
Potassium: 4.9 mmol/L (ref 3.5–5.1)
Sodium: 150 mmol/L — ABNORMAL HIGH (ref 135–145)

## 2018-10-02 LAB — COMPREHENSIVE METABOLIC PANEL
ALT: 35 U/L (ref 0–44)
AST: 50 U/L — ABNORMAL HIGH (ref 15–41)
Albumin: 3.4 g/dL — ABNORMAL LOW (ref 3.5–5.0)
Alkaline Phosphatase: 77 U/L (ref 38–126)
Anion gap: 15 (ref 5–15)
BUN: 52 mg/dL — ABNORMAL HIGH (ref 8–23)
CO2: 35 mmol/L — ABNORMAL HIGH (ref 22–32)
Calcium: 8.5 mg/dL — ABNORMAL LOW (ref 8.9–10.3)
Chloride: 97 mmol/L — ABNORMAL LOW (ref 98–111)
Creatinine, Ser: 1.61 mg/dL — ABNORMAL HIGH (ref 0.44–1.00)
GFR calc Af Amer: 34 mL/min — ABNORMAL LOW (ref >60)
GFR calc non Af Amer: 29 mL/min — ABNORMAL LOW (ref >60)
Glucose, Bld: 87 mg/dL (ref 70–99)
Potassium: 5.5 mmol/L — ABNORMAL HIGH (ref 3.5–5.1)
Sodium: 147 mmol/L — ABNORMAL HIGH (ref 135–145)
Total Bilirubin: 0.9 mg/dL (ref 0.3–1.2)
Total Protein: 6.5 g/dL (ref 6.5–8.1)

## 2018-10-02 LAB — ECHOCARDIOGRAM LIMITED
Height: 59 in
Weight: 3200 oz

## 2018-10-02 LAB — PHOSPHORUS: Phosphorus: 5.5 mg/dL — ABNORMAL HIGH (ref 2.5–4.6)

## 2018-10-02 LAB — C-REACTIVE PROTEIN: CRP: 2 mg/dL — ABNORMAL HIGH (ref <1.0)

## 2018-10-02 LAB — ABO/RH: ABO/RH(D): A POS

## 2018-10-02 LAB — FERRITIN: Ferritin: 45 ng/mL (ref 11–307)

## 2018-10-02 LAB — BRAIN NATRIURETIC PEPTIDE: B Natriuretic Peptide: 1110.6 pg/mL — ABNORMAL HIGH (ref 0.0–100.0)

## 2018-10-02 LAB — D-DIMER, QUANTITATIVE: D-Dimer, Quant: 1.86 ug/mL-FEU — ABNORMAL HIGH (ref 0.00–0.50)

## 2018-10-02 LAB — MAGNESIUM: Magnesium: 2.4 mg/dL (ref 1.7–2.4)

## 2018-10-02 MED ORDER — VITAMIN C 500 MG PO TABS
500.0000 mg | ORAL_TABLET | Freq: Every day | ORAL | Status: DC
  Administered 2018-10-02 – 2018-10-04 (×3): 500 mg via ORAL
  Filled 2018-10-02 (×4): qty 1

## 2018-10-02 MED ORDER — IPRATROPIUM-ALBUTEROL 20-100 MCG/ACT IN AERS
1.0000 | INHALATION_SPRAY | Freq: Four times a day (QID) | RESPIRATORY_TRACT | Status: DC
  Administered 2018-10-03 – 2018-10-04 (×6): 1 via RESPIRATORY_TRACT
  Filled 2018-10-02: qty 4

## 2018-10-02 MED ORDER — IPRATROPIUM BROMIDE HFA 17 MCG/ACT IN AERS
2.0000 | INHALATION_SPRAY | Freq: Four times a day (QID) | RESPIRATORY_TRACT | Status: DC
  Administered 2018-10-02 (×2): 2 via RESPIRATORY_TRACT
  Filled 2018-10-02: qty 12.9

## 2018-10-02 MED ORDER — DONEPEZIL HCL 10 MG PO TABS
10.0000 mg | ORAL_TABLET | Freq: Every day | ORAL | Status: DC
  Administered 2018-10-03 – 2018-10-04 (×2): 10 mg via ORAL
  Filled 2018-10-02: qty 1

## 2018-10-02 MED ORDER — LORAZEPAM 2 MG/ML IJ SOLN
1.0000 mg | INTRAMUSCULAR | Status: DC | PRN
  Administered 2018-10-02: 21:00:00 1 mg via INTRAVENOUS
  Filled 2018-10-02: qty 1

## 2018-10-02 MED ORDER — HYDROCOD POLST-CPM POLST ER 10-8 MG/5ML PO SUER: 5.0000 mL | Freq: Two times a day (BID) | ORAL | Status: DC | PRN

## 2018-10-02 MED ORDER — SIMVASTATIN 20 MG PO TABS: 20.0000 mg | ORAL_TABLET | Freq: Every evening | ORAL | Status: DC

## 2018-10-02 MED ORDER — ALBUTEROL SULFATE HFA 108 (90 BASE) MCG/ACT IN AERS
2.0000 | INHALATION_SPRAY | Freq: Four times a day (QID) | RESPIRATORY_TRACT | Status: DC
  Administered 2018-10-02 (×2): 2 via RESPIRATORY_TRACT
  Filled 2018-10-02: qty 6.7

## 2018-10-02 MED ORDER — SODIUM CHLORIDE 0.9% FLUSH: 10.0000 mL | INTRAVENOUS | Status: DC | PRN

## 2018-10-02 MED ORDER — FLUMAZENIL 0.5 MG/5ML IV SOLN
INTRAVENOUS | Status: AC
  Filled 2018-10-02: qty 5

## 2018-10-02 MED ORDER — LORAZEPAM 2 MG/ML IJ SOLN: 0.2500 mg | Freq: Four times a day (QID) | INTRAMUSCULAR | Status: DC | PRN

## 2018-10-02 MED ORDER — SODIUM CHLORIDE 0.9 % IV SOLN
200.0000 mg | Freq: Once | INTRAVENOUS | Status: AC
  Administered 2018-10-02: 200 mg via INTRAVENOUS
  Filled 2018-10-02: qty 40

## 2018-10-02 MED ORDER — FLUMAZENIL 0.5 MG/5ML IV SOLN
0.2000 mg | Freq: Once | INTRAVENOUS | Status: AC
  Administered 2018-10-02: 21:00:00 0.2 mg via INTRAVENOUS

## 2018-10-02 MED ORDER — NALOXONE HCL 0.4 MG/ML IJ SOLN
INTRAMUSCULAR | Status: AC
  Administered 2018-10-02: 0.4 mg
  Filled 2018-10-02: qty 1

## 2018-10-02 MED ORDER — DEXTROSE 5 % IV SOLN
INTRAVENOUS | Status: DC
  Administered 2018-10-02 – 2018-10-05 (×7): via INTRAVENOUS

## 2018-10-02 MED ORDER — ONDANSETRON HCL 4 MG PO TABS: 4.0000 mg | ORAL_TABLET | Freq: Four times a day (QID) | ORAL | Status: DC | PRN

## 2018-10-02 MED ORDER — LEVOTHYROXINE SODIUM 75 MCG PO TABS
75.0000 ug | ORAL_TABLET | Freq: Every day | ORAL | Status: DC
  Administered 2018-10-02 – 2018-10-05 (×3): 75 ug via ORAL
  Filled 2018-10-02 (×2): qty 1

## 2018-10-02 MED ORDER — SODIUM CHLORIDE 0.9 % IV SOLN
100.0000 mg | INTRAVENOUS | Status: DC
  Administered 2018-10-03 – 2018-10-05 (×3): 100 mg via INTRAVENOUS
  Filled 2018-10-02 (×3): qty 20

## 2018-10-02 MED ORDER — GUAIFENESIN-DM 100-10 MG/5ML PO SYRP: 10.0000 mL | ORAL_SOLUTION | ORAL | Status: DC | PRN

## 2018-10-02 MED ORDER — ZINC SULFATE 220 (50 ZN) MG PO CAPS
220.0000 mg | ORAL_CAPSULE | Freq: Every day | ORAL | Status: DC
  Administered 2018-10-02 – 2018-10-04 (×3): 220 mg via ORAL
  Filled 2018-10-02 (×3): qty 1

## 2018-10-02 MED ORDER — ALBUTEROL SULFATE HFA 108 (90 BASE) MCG/ACT IN AERS
2.0000 | INHALATION_SPRAY | RESPIRATORY_TRACT | Status: DC | PRN
  Administered 2018-10-02: 18:00:00 2 via RESPIRATORY_TRACT
  Filled 2018-10-02: qty 6.7

## 2018-10-02 MED ORDER — MEMANTINE HCL 10 MG PO TABS
10.0000 mg | ORAL_TABLET | Freq: Two times a day (BID) | ORAL | Status: DC
  Administered 2018-10-03 – 2018-10-04 (×3): 10 mg via ORAL
  Filled 2018-10-02 (×6): qty 1

## 2018-10-02 MED ORDER — ATORVASTATIN CALCIUM 10 MG PO TABS
10.0000 mg | ORAL_TABLET | Freq: Every day | ORAL | Status: DC
  Administered 2018-10-02 – 2018-10-04 (×3): 10 mg via ORAL
  Filled 2018-10-02 (×3): qty 1

## 2018-10-02 MED ORDER — BISACODYL 5 MG PO TBEC: 5.0000 mg | DELAYED_RELEASE_TABLET | Freq: Every day | ORAL | Status: DC | PRN

## 2018-10-02 MED ORDER — LORAZEPAM 2 MG/ML IJ SOLN: 0.5000 mg | Freq: Four times a day (QID) | INTRAMUSCULAR | Status: DC | PRN

## 2018-10-02 MED ORDER — ONDANSETRON HCL 4 MG/2ML IJ SOLN: 4.0000 mg | Freq: Four times a day (QID) | INTRAMUSCULAR | Status: DC | PRN

## 2018-10-02 MED ORDER — SODIUM CHLORIDE 0.9% FLUSH
10.0000 mL | Freq: Two times a day (BID) | INTRAVENOUS | Status: DC
  Administered 2018-10-02 – 2018-10-05 (×5): 10 mL

## 2018-10-02 MED ORDER — RIVAROXABAN 15 MG PO TABS
15.0000 mg | ORAL_TABLET | Freq: Every day | ORAL | Status: DC
  Administered 2018-10-02 – 2018-10-04 (×3): 15 mg via ORAL
  Filled 2018-10-02 (×5): qty 1

## 2018-10-02 MED ORDER — DILTIAZEM HCL 30 MG PO TABS
30.0000 mg | ORAL_TABLET | Freq: Four times a day (QID) | ORAL | Status: DC
  Administered 2018-10-02 – 2018-10-05 (×9): 30 mg via ORAL
  Filled 2018-10-02 (×15): qty 1

## 2018-10-02 NOTE — Progress Notes (Addendum)
PROGRESS NOTE    [name]  [id number] DOB: 1933-03-26 DOA: 10/25/2018 PCP: Hilbert Corrigan, MD   Brief Narrative:  [name] is a 85 y.o. WF PMHx chronic respiratory failure (4 L O2 at home), Alzheimer's, stroke, permanent atrial fibrillation on Xarelto, systolic and diastolic CHF with Pacer , HTN, OSA, pulmonary HTN.  Patient seen for worsening shortness of breath over the past 24 hours.  She had been diagnosed with COVID on "Sunday.  Is uncertain as to why she got that test.  She has been having worsening work of breathing with tachypnea and accessory muscle use.  She is on oxygen on a chronic basis and is at 4 L nasal cannula.  Patient unable to provide any history.  Emergency Department Course: COVID positive.  BNP elevated.  Chest x-ray shows infiltrates versus pulmonary edema.  She does have swelling of her upper extremities.    Subjective: 9/5 A/O x1 (does not know where, when, why)   Assessment & Plan:   Principal Problem:   Acute on chronic respiratory failure with hypoxia (HCC) Active Problems:   Permanent atrial fibrillation   Diastolic dysfunction   Pacemaker-St.Jude   Hypothyroidism   Long term current use of anticoagulant   Alzheimer's disease (HCC)   Essential hypertension   Pneumonia due to COVID-19 virus   Acute on chronic combined systolic and diastolic heart failure (HCC)  Acute on Chronic Respiratory Failure with hypoxia/Covid-19 Pneumonia Recent Labs  Lab 10/20/2018 1507 10/02/18 0416  CRP 2.1* 2.0*   Recent Labs  Lab 10/02/18 0416  DDIMER 1.86*  -Decadron 6mg Daaily  -Bilateral Diffuse opacification and O2 demand meets criteria for Remdesivir. -Remdesivir per pharmacy protocol   - Titrate O2 to maintain SPO2 89 and 93% - Combivent QID - COVID daily inflammatory marker pending  Acute on Chronic Systolic and Diastolic CHF -Patient unable to communicate  her basic weight -Strict in and out -Daily weight  -Echocardiogram performed awaiting read - Cardizem 30 mg QID, until PICC line placed then will start Cardizem IV for A. fib RVR - Lasix 60 mg BID  A. fib RVR  -See CHF - Continue Xarelto  Essential HTN - See CHF - BP slightly soft secondary to patient being in A. fib RVR, should improve once under control  Pacemaker present  Hypothyroidism - 75 mcg daily  Alzheimer's - Aricept 10 mg daily - Namenda 10 mg BID - Cindy Johnson (daughter) states that at baseline patient will hold a conversation with you but quickly forgets i.e. poor short-term memory  Hyperkalemia - We will monitor closely should resolve with Lasix     DVT prophylaxis: Xarelto Code Status: DNR Family Communication:  Disposition Plan: TBD   Consultants:  None  Procedures/Significant Events:   9/4 PCXR; diffuse bilateral airspace disease. This could represent congestive heart failure and edema versus pneumonia 9/5 bilateral upper extremity venous Doppler; Right: Negative DVT/SVT; LEFT negative DVT/SVT  .     I have personally reviewed and interpreted all radiology studies and my findings are as above.  VENTILATOR SETTINGS: 9/5  3 L/min SPO2 93%   Cultures 9/4 SARS Corronavirus 2 positive    Antimicrobials: Anti-infectives (From admission, onward)   Start     Stop   10/03/18 0800  remdesivir 100 mg in sodium chloride 0.9 % 250 mL IVPB     10/07/18 0759   10/02/18 0800  remdesivir 200 mg in sodium chloride 0.9 % 250 mL IVPB     09" /05/20 0859  Devices    LINES / TUBES:      Continuous Infusions: . remdesivir 200 mg in NS 250 mL     Followed by  . [START ON 10/03/2018] remdesivir 100 mg in NS 250 mL       Objective: Vitals:   10/02/18 0400 10/02/18 0452 10/02/18 0625 10/02/18 0751  BP: 114/69  100/62 106/60  Pulse:  (!) 122 76 78  Resp:   (!) 31 (!) 30  Temp:   (!) 96.3 F (35.7 C) (!) 97 F (36.1 C)  TempSrc:   Axillary Oral  SpO2:  100% 100% 95%  Weight:       Height:        Intake/Output Summary (Last 24 hours) at 10/02/2018 [id number] Last data filed at 10/19/2018 1930 Gross per 24 hour  Intake -  Output 300 ml  Net -300 ml   Filed Weights   10/07/2018 [id number]  Weight: 90.7 kg    Examination:  General: A/O x1 (does not know where, when, why) positive acute respiratory distress Eyes: negative scleral hemorrhage, negative anisocoria, negative icterus ENT: Negative Runny nose, negative gingival bleeding, Neck:  Negative scars, masses, torticollis, lymphadenopathy, JVD Lungs: Clear to auscultation bilaterally without wheezes or crackles Cardiovascular: Regular rate and rhythm without murmur gallop or rub normal S1 and S2 Abdomen: negative abdominal pain, nondistended, positive soft, bowel sounds, no rebound, no ascites, no appreciable mass Extremities: No significant cyanosis, clubbing, or edema bilateral lower extremities Skin: Negative rashes, lesions, ulcers Psychiatric: Unable to assess secondary to patient's Alzheimer's disease Central nervous system: Patient able to answer simple questions such as her name, spontaneously moves all extremities did not follow commands  .     Data Reviewed: Care during the described time interval was provided by me .  I have reviewed this patient's available data, including medical history, events of note, physical examination, and all test results as part of my evaluation.   CBC: Recent Labs  Lab 09/30/2018 1507 10/02/18 0416  WBC 8.3 7.5  NEUTROABS 6.2 7.0  HGB 10.6* 11.0*  HCT 40.4 41.5  MCV 103.3* 104.0*  PLT 210 [id number]   Basic Metabolic Panel: Recent Labs  Lab 10/08/2018 1507 10/02/18 0416  NA 146* 147*  K 5.6* 5.5*  CL 98 97*  CO2 37* 35*  GLUCOSE 83 87  BUN 48* 52*  CREATININE 1.53* 1.61*  CALCIUM 8.3* 8.5*   GFR: Estimated Creatinine Clearance: 26 mL/min (A) (by C-G formula based on SCr of 1.61 mg/dL (H)). Liver Function Tests: Recent Labs  Lab 10/10/2018 1507 10/02/18 0416  AST 53*  50*  ALT 35 35  ALKPHOS 73 77  BILITOT 0.4 0.9  PROT 6.2* 6.5  ALBUMIN 3.4* 3.4*   No results for input(s): LIPASE, AMYLASE in the last 168 hours. No results for input(s): AMMONIA in the last 168 hours. Coagulation Profile: Recent Labs  Lab 10/08/2018 1507  INR 1.1   Cardiac Enzymes: No results for input(s): CKTOTAL, CKMB, CKMBINDEX, TROPONINI in the last 168 hours. BNP (last 3 results) No results for input(s): PROBNP in the last 8760 hours. HbA1C: No results for input(s): HGBA1C in the last 72 hours. CBG: No results for input(s): GLUCAP in the last 168 hours. Lipid Profile: Recent Labs    10/16/2018 1507  TRIG 134   Thyroid Function Tests: No results for input(s): TSH, T4TOTAL, FREET4, T3FREE, THYROIDAB in the last 72 hours. Anemia Panel: Recent Labs    10/03/2018 1507 10/02/18 0416  FERRITIN 46 45  Urine analysis:    Component Value Date/Time   COLORURINE YELLOW 10/24/2018 1019   APPEARANCEUR CLEAR 09/30/2018 1019   APPEARANCEUR Clear 06/07/2015 1039   LABSPEC 1.014 10/10/2018 1019   PHURINE 5.0 10/25/2018 1019   GLUCOSEU NEGATIVE 10/16/2018 1019   HGBUR SMALL (A) 10/06/2018 1019   BILIRUBINUR NEGATIVE 10/16/2018 1019   BILIRUBINUR Negative 06/07/2015 1039   KETONESUR NEGATIVE 09/29/2018 1019   PROTEINUR NEGATIVE 10/10/2018 1019   UROBILINOGEN 0.2 08/12/2010 1605   NITRITE NEGATIVE 10/25/2018 1019   LEUKOCYTESUR NEGATIVE 10/06/2018 1019   Sepsis Labs: @LABRCNTIP (procalcitonin:4,lacticidven:4)  ) Recent Results (from the past 240 hour(s))  SARS Coronavirus 2 [facility] order, Performed in [facility] hospital lab) Nasopharyngeal Nasopharyngeal Swab     Status: Abnormal   Collection Time: 09/29/2018 10:19 AM   Specimen: Nasopharyngeal Swab  Result Value Ref Range Status   SARS Coronavirus 2 POSITIVE (A) NEGATIVE Final    Comment: RESULT CALLED TO, READ BACK BY AND VERIFIED WITH: C.KEMP AT 1229 ON [id number] BY THOMPSON S. Performed at [name], [address], [place], [place] [address]   Culture, blood (routine x 2)     Status: None (Preliminary result)   Collection Time: 09/30/2018 11:39 AM   Specimen: Left Antecubital; Blood  Result Value Ref Range Status   Specimen Description LEFT ANTECUBITAL BOTTLES DRAWN AEROBIC ONLY  Final   Special Requests Blood Culture adequate volume  Final   Culture   Final    NO GROWTH < 24 HOURS Performed at [facility], [address] [place], [place] [address]    Report Status PENDING  Incomplete  Culture, blood (routine x 2)     Status: None (Preliminary result)   Collection Time: 10/17/2018  3:07 PM   Specimen: BLOOD RIGHT ARM  Result Value Ref Range Status   Specimen Description BLOOD RIGHT ARM  Final   Special Requests   Final    BOTTLES DRAWN AEROBIC AND ANAEROBIC Blood Culture adequate volume   Culture   Final    NO GROWTH < 24 HOURS Performed at [facility], [address], [place], [place] [address]    Report Status PENDING  Incomplete         Radiology Studies: Dg Chest Port 1 View  Result Date: 09/30/2018 CLINICAL DATA:  Short of breath.  COVID-19 positive EXAM: PORTABLE CHEST 1 VIEW COMPARISON:  05/11/2017 FINDINGS: Marked cardiac enlargement.  Single lead pacemaker unchanged. Interval development of diffuse bilateral airspace disease right greater than left. Small pleural effusions and bibasilar atelectasis. IMPRESSION: Interval development of diffuse bilateral airspace disease. This could represent congestive heart failure and edema versus pneumonia. Electronically Signed   By: Franchot Gallo M.D.   On: 09/30/2018 11:07        Scheduled Meds: . albuterol  2 puff Inhalation Q6H  . dexamethasone (DECADRON) injection  6 mg Intravenous Q24H  . furosemide  60 mg Intravenous Q12H  . ipratropium  2 puff Inhalation Q6H  . levothyroxine  75 mcg Oral Daily  . Rivaroxaban  15 mg Oral Q supper  . simvastatin  20 mg Oral QPM  . vitamin C  500 mg Oral Daily  . zinc sulfate  220 mg Oral  Daily   Continuous Infusions: . remdesivir 200 mg in NS 250 mL     Followed by  . [START ON 10/03/2018] remdesivir 100 mg in NS 250 mL       LOS: 1 day   The patient is critically ill with multiple  organ systems failure and requires high complexity decision making for assessment and support, frequent evaluation and titration of therapies, application of advanced monitoring technologies and extensive interpretation of multiple databases. Critical Care Time devoted to patient care services described in this note  Time spent: 40 minutes     Addley Ballinger, Geraldo Docker, MD [facility] Pager [phone]  If 7PM-7AM, please contact night-coverage [email or web address] Password [email or web address] 10/02/2018, 8:21 AM

## 2018-10-02 NOTE — Progress Notes (Signed)
Pharmacy Brief Note   O:  ALT: 35 CXR: Interval development of diffuse bilateral airspace disease right greater than left. Small pleural effusions and bibasilar atelectasis. SpO2: 97% after 6 l/min on nasal cannula   A/P:  Patient meets requirements for remdesivir therapy .  Will start  remdesivir 200 mg IV x 1  followed by 100 mg IV daily x 4 days.  Monitor ALT  Royetta Asal, PharmD, BCPS Pager [phone] 10/02/2018 6:19 AM

## 2018-10-02 NOTE — Progress Notes (Signed)
1mg  ativan worked significantly more than expected.  Patient partially reversed with 0.2 of flumazinil.  Still sleepy though vitals improved.  Will reduce PRN ativan dosing to 0.25 for now.

## 2018-10-02 NOTE — Progress Notes (Signed)
Pt's primary RN Rasheen called MD to discuss pt's status. Also discussed with MD by this RN. Provided vital signs to MD via telephone and MD states he will put in order for PO cardizem, IV fluid and a PICC line. Awaiting orders at this time.

## 2018-10-02 NOTE — Progress Notes (Signed)
Patient confused, pulling at lines, not localizing symptoms or complaints anywhere specific:  1) ativan 1mg  PRN ordered 2) checking BMP and BNP.  CXR earlier this evening was more clear.  O2 requirements have been improving as well (so improving from respiratory standpoint).  Still has some peripheral edema.  But am concerned she may be becoming dehydrated:  1) Poor PO intake per RN report  2) has been on lasix 60mg  IV BID for 3 doses now  3) BMP this AM showed hypernatremia, BUN and creat elevations in a pre-renal picture, and what looks suspiciously like a contraction alkalosis (although the mild hyperkalemia isnt c/w this). 3) will DC lasix for the moment while BMP / BNP are pending.

## 2018-10-02 NOTE — Progress Notes (Signed)
  Echocardiogram 2D Echocardiogram has been performed.  [name] 10/02/2018, 11:25 AM

## 2018-10-02 NOTE — Progress Notes (Signed)
Reported to Dr Alcario Drought pt's signs of dehydration and anxiety, letting him know that pt is pulling at all lined including PICC. Ativan and labs ordered

## 2018-10-02 NOTE — Progress Notes (Signed)
Pt's respiratory drive greatly decreased briefly after getting 1mg  ativan. Flumazinil 0.2mg  given. Pt was already coming around before dose was administered altho, not awake. She is now arousable with stimulation but then goes back to sleep. VSS with O2 at 100% on 10L/[place] in the mouth for now, RR at 18-20

## 2018-10-02 NOTE — TOC Initial Note (Signed)
Transition of Care [facility]) - Initial/Assessment Note    Patient Details  Name: [name] MRN: [id number] Date of Birth: 11-18-1933  [facility]) CM/SW Contact:    Erenest Rasher, RN Phone Number: [phone] 10/02/2018, 11:19 AM  Clinical Narrative:                 Pt is from [facility] CSW referral for return back to SNF at dc. Will continue to follow for dc needs.   Expected Discharge Plan: Skilled Nursing Facility Barriers to Discharge: Continued Medical Work up   Patient Goals and CMS Choice        Expected Discharge Plan and Services Expected Discharge Plan: [place] In-house Referral: Clinical Social Work Discharge Planning Services: CM Consult   Living arrangements for the past 2 months: [place]                                      Prior Living Arrangements/Services Living arrangements for the past 2 months: [place] Lives with:: Facility Resident          Need for Family Participation in Patient Care: Yes (Comment) Care giver support system in place?: Yes (comment)   Criminal Activity/Legal Involvement Pertinent to Current Situation/Hospitalization: No - Comment as needed  Activities of Daily Living   ADL Screening (condition at time of admission) Patient's cognitive ability adequate to safely complete daily activities?: No Is the patient deaf or have difficulty hearing?: No Does the patient have difficulty seeing, even when wearing glasses/contacts?: No Does the patient have difficulty concentrating, remembering, or making decisions?: Yes Patient able to express need for assistance with ADLs?: Yes Independently performs ADLs?: No Communication: Independent Dressing (OT): Needs assistance Grooming: Needs assistance Is this a change from baseline?: Pre-admission baseline Feeding: Needs assistance Is this a change from baseline?: Pre-admission baseline Bathing:  Needs assistance Is this a change from baseline?: Pre-admission baseline Toileting: Needs assistance Is this a change from baseline?: Pre-admission baseline In/Out Bed: Needs assistance Is this a change from baseline?: Pre-admission baseline Walks in Home: Needs assistance Is this a change from baseline?: Pre-admission baseline Does the patient have difficulty walking or climbing stairs?: Yes Weakness of Legs: Both Weakness of Arms/Hands: Both  Permission Sought/Granted Permission sought to share information with : Case Manager Permission granted to share information with : Yes, Verbal Permission Granted              Emotional Assessment           Psych Involvement: No (comment)  Admission diagnosis:  COVID-19 [U07.1, J98.8] Patient Active Problem List   Diagnosis Date Noted  . Acute on chronic respiratory failure with hypoxia ([place]) 10/11/2018  . Pneumonia due to COVID-19 virus 10/03/2018  . Acute on chronic combined systolic and diastolic heart failure ([place]) 09/28/2018  . PVD (peripheral vascular disease) ([place]) 05/28/2016  . Atherosclerosis of native artery of both lower extremities with bilateral ulceration ([place]) 05/28/2016  . Alzheimer's disease ([place]) 10/11/2014  . Essential hypertension 10/11/2014  . Cardiomyopathy, dilated ([place]) 07/24/2014  . Apnea, sleep 07/24/2014  . Anemia 04/28/2014  . Chronic hypoxemic respiratory failure ([place]) 04/12/2014  . Chronic ulcer of leg ([place]) 04/12/2014  . Lymphedema of leg 04/12/2014  . Disorder of nutrition 04/12/2014  . Hypothyroidism 03/24/2014  . Stasis dermatitis of both legs 05/06/2013  . Pacemaker-St.Jude 11/25/2011  .  Complete heart block ([place]) 12/12/2010  . Permanent atrial fibrillation 12/12/2010  . Diastolic dysfunction [id number]  . Venous insufficiency 12/12/2010  . Long term current use of anticoagulant 09/30/2002   PCP:  Hilbert Corrigan, MD Pharmacy:   [place], [place] - [address]  [place] [address] Phone: [phone] Fax: [phone]  [place], [name] [address] Phone: [phone] Fax: [place], [name] [address] Phone: [phone] Fax: [phone]  [place] [address] [name] [address]  [address] [name] [address] Phone: [phone] Fax: [place], [name] - [address] [name] [address] Phone: [phone] Fax: [phone]     Social Determinants of Health (SDOH) Interventions    Readmission Risk Interventions No flowsheet data found.

## 2018-10-02 NOTE — Progress Notes (Signed)
Bilateral upper extremity venous duplex has been completed. Preliminary results can be found in CV Proc through chart review.    10/02/18 3:24 PM Carlos Levering RVT

## 2018-10-02 NOTE — ED Notes (Signed)
Report given to [facility]. 

## 2018-10-02 NOTE — Progress Notes (Signed)
Peripherally Inserted Central Catheter/Midline Placement  The IV Nurse has discussed with the patient and/or persons authorized to consent for the patient, the purpose of this procedure and the potential benefits and risks involved with this procedure.  The benefits include less needle sticks, lab draws from the catheter, and the patient may be discharged home with the catheter. Risks include, but not limited to, infection, bleeding, blood clot (thrombus formation), and puncture of an artery; nerve damage and irregular heartbeat and possibility to perform a PICC exchange if needed/ordered by physician.  Alternatives to this procedure were also discussed.  Bard Power PICC patient education guide, fact sheet on infection prevention and patient information card has been provided to patient /or left at bedside.  Phone consent obtain from daughter [name]  PICC/Midline Placement Documentation  PICC Single Lumen 10/02/18 PICC Right Brachial 36 cm 0 cm (Active)  Indication for Insertion or Continuance of Line Poor Vasculature-patient has had multiple peripheral attempts or PIVs lasting less than 24 hours 10/02/18 1640  Exposed Catheter (cm) 0 cm 10/02/18 1640  Site Assessment Clean;Intact;Dry 10/02/18 1640  Line Status Flushed;Saline locked;Blood return noted 10/02/18 1640  Dressing Type Transparent 10/02/18 1640  Dressing Status Clean;Antimicrobial disc in place;Dry;Intact 10/02/18 1640  Dressing Change Due 10/09/18 10/02/18 1640       Gordan Payment 10/02/2018, 4:41 PM

## 2018-10-03 DIAGNOSIS — F0280 Dementia in other diseases classified elsewhere without behavioral disturbance: Secondary | ICD-10-CM | POA: Diagnosis present

## 2018-10-03 DIAGNOSIS — I5043 Acute on chronic combined systolic (congestive) and diastolic (congestive) heart failure: Secondary | ICD-10-CM | POA: Diagnosis present

## 2018-10-03 DIAGNOSIS — I10 Essential (primary) hypertension: Secondary | ICD-10-CM | POA: Diagnosis present

## 2018-10-03 DIAGNOSIS — I739 Peripheral vascular disease, unspecified: Secondary | ICD-10-CM

## 2018-10-03 DIAGNOSIS — I4891 Unspecified atrial fibrillation: Secondary | ICD-10-CM | POA: Diagnosis present

## 2018-10-03 LAB — CBC WITH DIFFERENTIAL/PLATELET
Abs Immature Granulocytes: 0.03 10*3/uL (ref 0.00–0.07)
Basophils Absolute: 0 10*3/uL (ref 0.0–0.1)
Basophils Relative: 0 %
Eosinophils Absolute: 0 10*3/uL (ref 0.0–0.5)
Eosinophils Relative: 0 %
HCT: 39.7 % (ref 36.0–46.0)
Hemoglobin: 10.4 g/dL — ABNORMAL LOW (ref 12.0–15.0)
Immature Granulocytes: 0 %
Lymphocytes Relative: 7 %
Lymphs Abs: 0.6 10*3/uL — ABNORMAL LOW (ref 0.7–4.0)
MCH: 26.8 pg (ref 26.0–34.0)
MCHC: 26.2 g/dL — ABNORMAL LOW (ref 30.0–36.0)
MCV: 102.3 fL — ABNORMAL HIGH (ref 80.0–100.0)
Monocytes Absolute: 0.9 10*3/uL (ref 0.1–1.0)
Monocytes Relative: 11 %
Neutro Abs: 6.7 10*3/uL (ref 1.7–7.7)
Neutrophils Relative %: 82 %
Platelets: 212 10*3/uL (ref 150–400)
RBC: 3.88 MIL/uL (ref 3.87–5.11)
RDW: 16.2 % — ABNORMAL HIGH (ref 11.5–15.5)
WBC: 8.1 10*3/uL (ref 4.0–10.5)
nRBC: 0.4 % — ABNORMAL HIGH (ref 0.0–0.2)

## 2018-10-03 LAB — COMPREHENSIVE METABOLIC PANEL
ALT: 31 U/L (ref 0–44)
AST: 40 U/L (ref 15–41)
Albumin: 3.4 g/dL — ABNORMAL LOW (ref 3.5–5.0)
Alkaline Phosphatase: 68 U/L (ref 38–126)
Anion gap: 15 (ref 5–15)
BUN: 62 mg/dL — ABNORMAL HIGH (ref 8–23)
CO2: 39 mmol/L — ABNORMAL HIGH (ref 22–32)
Calcium: 8.7 mg/dL — ABNORMAL LOW (ref 8.9–10.3)
Chloride: 94 mmol/L — ABNORMAL LOW (ref 98–111)
Creatinine, Ser: 1.5 mg/dL — ABNORMAL HIGH (ref 0.44–1.00)
GFR calc Af Amer: 37 mL/min — ABNORMAL LOW (ref >60)
GFR calc non Af Amer: 32 mL/min — ABNORMAL LOW (ref >60)
Glucose, Bld: 160 mg/dL — ABNORMAL HIGH (ref 70–99)
Potassium: 4.8 mmol/L (ref 3.5–5.1)
Sodium: 148 mmol/L — ABNORMAL HIGH (ref 135–145)
Total Bilirubin: 0.5 mg/dL (ref 0.3–1.2)
Total Protein: 6.2 g/dL — ABNORMAL LOW (ref 6.5–8.1)

## 2018-10-03 LAB — URINE CULTURE: Culture: NO GROWTH

## 2018-10-03 LAB — MAGNESIUM: Magnesium: 2.2 mg/dL (ref 1.7–2.4)

## 2018-10-03 LAB — FERRITIN: Ferritin: 46 ng/mL (ref 11–307)

## 2018-10-03 LAB — C-REACTIVE PROTEIN: CRP: 1.4 mg/dL — ABNORMAL HIGH (ref <1.0)

## 2018-10-03 LAB — D-DIMER, QUANTITATIVE: D-Dimer, Quant: 2.88 ug/mL-FEU — ABNORMAL HIGH (ref 0.00–0.50)

## 2018-10-03 LAB — PHOSPHORUS: Phosphorus: 5.6 mg/dL — ABNORMAL HIGH (ref 2.5–4.6)

## 2018-10-03 MED ORDER — FUROSEMIDE 10 MG/ML IJ SOLN
60.0000 mg | Freq: Once | INTRAMUSCULAR | Status: AC
  Administered 2018-10-03: 60 mg via INTRAVENOUS
  Filled 2018-10-03: qty 6

## 2018-10-03 MED ORDER — MORPHINE SULFATE (PF) 2 MG/ML IV SOLN
2.0000 mg | Freq: Once | INTRAVENOUS | Status: AC
  Administered 2018-10-03: 21:00:00 2 mg via INTRAVENOUS
  Filled 2018-10-03: qty 1

## 2018-10-03 NOTE — Progress Notes (Signed)
Called pt's daughter to update her on her mothers plan of care.  She appreciated the update.

## 2018-10-03 NOTE — Progress Notes (Signed)
Dr Bridgett Larsson coming to assess patient status

## 2018-10-03 NOTE — Progress Notes (Signed)
Report given to me by Mariane Baumgarten RN, Pt is in high fowlers with 6L/[place] on and has labored breathing. Pt is able to have short conversation albeit confused speech. Tele appears to still be NSR with controlled rate. Bed alarm is set, IV fluids infusing

## 2018-10-03 NOTE — Plan of Care (Signed)
POC reviewed with physician

## 2018-10-03 NOTE — Progress Notes (Signed)
Morphine 2mg  IVP given per Dr Lianne Moris order for respiratory support. It has no significant effect. RR remain in the 30's and spike into the low 40's. I updated patients daughter Jenny Reichmann over the phone and told her that her mothers condition is guarded. She was able to talk to her mom thru my Iphone for a few minutes. Pt was aware but conversed very little. Dr Bridgett Larsson has been updated

## 2018-10-03 NOTE — Progress Notes (Signed)
Msg sent to Dr Bridgett Larsson in regards to pt's declining respiratory status. RR has increased to 41 and she restless in bed with increasing HR also. Informed Dr Bridgett Larsson that policy calls for  Him to contact family and have a conversation about comfort care options

## 2018-10-03 NOTE — Plan of Care (Signed)
  Problem: Respiratory: Goal: Will maintain a patent airway Outcome: Progressing Goal: Complications related to the disease process, condition or treatment will be avoided or minimized Outcome: Progressing   

## 2018-10-03 NOTE — Progress Notes (Addendum)
PROGRESS NOTE    [name]  [id number] DOB: 06/06/1933 DOA: 10/19/2018 PCP: Hilbert Corrigan, MD   Brief Narrative:  [name] is a 85 y.o. WF PMHx chronic respiratory failure (4 L O2 at home), Alzheimer's, stroke, permanent atrial fibrillation on Xarelto, systolic and diastolic CHF with Pacer , HTN, OSA, pulmonary HTN.  Patient seen for worsening shortness of breath over the past 24 hours.  She had been diagnosed with COVID on Sunday.  Is uncertain as to why she got that test.  She has been having worsening work of breathing with tachypnea and accessory muscle use.  She is on oxygen on a chronic basis and is at 4 L nasal cannula.  Patient unable to provide any history.  Emergency Department Course: COVID positive.  BNP elevated.  Chest x-ray shows infiltrates versus pulmonary edema.  She does have swelling of her upper extremities.    Subjective: 9/6 A/O x1 (does not know where, when, why).  However more alert follows some commands.  Per RN ate breakfast this A.m.    Assessment & Plan:   Principal Problem:   Acute on chronic respiratory failure with hypoxia (HCC) Active Problems:   Permanent atrial fibrillation   Diastolic dysfunction   Pacemaker-St.Jude   Hypothyroidism   Long term current use of anticoagulant   Alzheimer's disease (HCC)   Essential hypertension   PVD (peripheral vascular disease) (HCC)   Pneumonia due to COVID-19 virus   Acute on chronic combined systolic and diastolic heart failure (HCC)   Atrial fibrillation with RVR (HCC)   Acute on chronic combined systolic and diastolic CHF (congestive heart failure) (HCC)   Benign essential HTN   Alzheimer's dementia ([place])  Acute on Chronic Respiratory Failure with hypoxia/Covid-19 Pneumonia Recent Labs  Lab 10/04/2018 1507 10/02/18 0416 10/03/18 0600  CRP 2.1* 2.0* 1.4*   Recent Labs  Lab 10/03/18 0600  DDIMER 2.88*  -Decadron 6mg  Daaily  -Bilateral Diffuse opacification and O2  demand meets criteria for Remdesivir. -Remdesivir per pharmacy protocol   - Titrate O2 to maintain SPO2 89 and 93% - Combivent QID - COVID daily inflammatory marker pending  Acute on Chronic Systolic and Diastolic CHF -Patient unable to communicate  her basic weight -Strict in and out -1.0 L -Daily weight  Filed Weights   09/29/2018 0937 10/03/18 1002  Weight: 90.7 kg 78.9 kg  -Echocardiogram; systolic and diastolic CHF confirmed see results below - 9/6 continue Cardizem 30 mg QID  - Lasix IV 60 mg x 1.  Will administer PRN  A. fib RVR  -See CHF - Continue Xarelto -9/6 currently NSR  Essential HTN - See CHF -BP better controlled now that patient reverted to NSR  Pacemaker present  Hypothyroidism - 75 mcg daily  Alzheimer's - Aricept 10 mg daily - Namenda 10 mg BID  Hyperkalemia - Resolved     DVT prophylaxis: Xarelto Code Status: DNR Family Communication: 9/5 [name] (daughter) states that at baseline patient will hold a conversation with you but quickly forgets i.e. poor short-term memory Disposition Plan: TBD   Consultants:  None  Procedures/Significant Events:   9/4 PCXR; diffuse bilateral airspace disease. This could represent congestive heart failure and edema versus pneumonia 9/5 bilateral upper extremity venous Doppler; Right: Negative DVT/SVT; LEFT negative DVT/SVT  9/5 echocardiogram: EF 30 to 35% with global hypokinesis.  Moderate LVEF - LA/RA severe dilation - MV moderate regurgitation -TV severe regurgitation - MV mild stenosis.     I have personally reviewed  and interpreted all radiology studies and my findings are as above.  VENTILATOR SETTINGS: 9/5 [place] 3 L/min SPO2 95%   Cultures 9/4 SARS Corronavirus 2 positive     Antimicrobials: Anti-infectives (From admission, onward)   Start     Stop   10/03/18 0800  remdesivir 100 mg in sodium chloride 0.9 % 250 mL IVPB     10/07/18 0759   10/02/18 0800  remdesivir 200 mg in sodium  chloride 0.9 % 250 mL IVPB     10/02/18 0859       Devices    LINES / TUBES:      Continuous Infusions: . dextrose 75 mL/hr at 10/03/18 1416  . remdesivir 100 mg in NS 250 mL 100 mg (10/03/18 0920)     Objective: Vitals:   10/03/18 1137 10/03/18 1245 10/03/18 1543 10/03/18 1604  BP: 127/63 136/87 124/80 (!) 134/49  Pulse: 91 95  88  Resp: (!) 33 (!) 25 (!) 24 (!) 38  Temp: 98.5 F (36.9 C) 98.4 F (36.9 C)  (!) 97.3 F (36.3 C)  TempSrc: Axillary Oral Oral Oral  SpO2: 95% 99%  95%  Weight:      Height:        Intake/Output Summary (Last 24 hours) at 10/03/2018 1624 Last data filed at 10/03/2018 0500 Gross per 24 hour  Intake -  Output 750 ml  Net -750 ml   Filed Weights   10/09/2018 0937 10/03/18 1002  Weight: 90.7 kg 78.9 kg    Physical Exam:  General: A/O x1 (does not know where, when, why), obeys some commands positive acute respiratory distress Eyes: negative scleral hemorrhage, negative anisocoria, negative icterus ENT: Negative Runny nose, negative gingival bleeding, Neck:  Negative scars, masses, torticollis, lymphadenopathy, JVD Lungs: Clear to auscultation bilaterally without wheezes or crackles Cardiovascular: Regular rate and rhythm without murmur gallop or rub normal S1 and S2 Abdomen: negative abdominal pain, nondistended, positive soft, bowel sounds, no rebound, no ascites, no appreciable mass Extremities: No significant cyanosis, clubbing, or edema bilateral lower extremities Skin: Negative rashes, lesions, ulcers Psychiatric: Unable to evaluate secondary to patient's moderate to severe Alzheimer's Central nervous system: Patient moves all extremities spontaneously, follows some commands, anywhere you touch her on her body to exam her states is painful.   .     Data Reviewed: Care during the described time interval was provided by me .  I have reviewed this patient's available data, including medical history, events of note, physical  examination, and all test results as part of my evaluation.   CBC: Recent Labs  Lab 10/14/2018 1507 10/02/18 0416 10/03/18 0600  WBC 8.3 7.5 8.1  NEUTROABS 6.2 7.0 6.7  HGB 10.6* 11.0* 10.4*  HCT 40.4 41.5 39.7  MCV 103.3* 104.0* 102.3*  PLT 210 196 [id number]   Basic Metabolic Panel: Recent Labs  Lab 10/21/2018 1507 10/02/18 0416 10/02/18 2057 10/03/18 0600  NA 146* 147* 150* 148*  K 5.6* 5.5* 4.9 4.8  CL 98 97* 97* 94*  CO2 37* 35* 37* 39*  GLUCOSE 83 87 98 160*  BUN 48* 52* 58* 62*  CREATININE 1.53* 1.61* 1.62* 1.50*  CALCIUM 8.3* 8.5* 8.7* 8.7*  MG  --  2.4  --  2.2  PHOS  --  5.5*  --  5.6*   GFR: Estimated Creatinine Clearance: 25.8 mL/min (A) (by C-G formula based on SCr of 1.5 mg/dL (H)). Liver Function Tests: Recent Labs  Lab 10/04/2018 1507 10/02/18 0416 10/03/18 0600  AST 53*  50* 40  ALT 35 35 31  ALKPHOS 73 77 68  BILITOT 0.4 0.9 0.5  PROT 6.2* 6.5 6.2*  ALBUMIN 3.4* 3.4* 3.4*   No results for input(s): LIPASE, AMYLASE in the last 168 hours. No results for input(s): AMMONIA in the last 168 hours. Coagulation Profile: Recent Labs  Lab 10/06/2018 1507  INR 1.1   Cardiac Enzymes: No results for input(s): CKTOTAL, CKMB, CKMBINDEX, TROPONINI in the last 168 hours. BNP (last 3 results) No results for input(s): PROBNP in the last 8760 hours. HbA1C: No results for input(s): HGBA1C in the last 72 hours. CBG: No results for input(s): GLUCAP in the last 168 hours. Lipid Profile: Recent Labs    10/02/2018 1507  TRIG 134   Thyroid Function Tests: No results for input(s): TSH, T4TOTAL, FREET4, T3FREE, THYROIDAB in the last 72 hours. Anemia Panel: Recent Labs    10/02/18 0416 10/03/18 0600  FERRITIN 45 46   Urine analysis:    Component Value Date/Time   COLORURINE YELLOW 10/10/2018 1019   APPEARANCEUR CLEAR 10/07/2018 1019   APPEARANCEUR Clear 06/07/2015 1039   LABSPEC 1.014 10/23/2018 1019   PHURINE 5.0 10/10/2018 1019   GLUCOSEU NEGATIVE 10/21/2018  1019   HGBUR SMALL (A) 10/23/2018 1019   BILIRUBINUR NEGATIVE 10/13/2018 1019   BILIRUBINUR Negative 06/07/2015 1039   KETONESUR NEGATIVE 10/08/2018 1019   PROTEINUR NEGATIVE 10/21/2018 1019   UROBILINOGEN 0.2 08/12/2010 1605   NITRITE NEGATIVE 10/18/2018 1019   LEUKOCYTESUR NEGATIVE 10/22/2018 1019   Sepsis Labs: @LABRCNTIP (procalcitonin:4,lacticidven:4)  ) Recent Results (from the past 240 hour(s))  Urine culture     Status: None   Collection Time: 10/10/2018 10:19 AM   Specimen: Urine, Catheterized  Result Value Ref Range Status   Specimen Description   Final    URINE, CATHETERIZED Performed at [facility], [address] [place], [place] [address]    Special Requests   Final    NONE Performed at [facility], [address] [place], [place] [address]    Culture   Final    NO GROWTH Performed at [facility], [place] [address] [place], [place] [address]    Report Status 10/03/2018 FINAL  Final  SARS Coronavirus 2 [facility] order, Performed in [facility] hospital lab) Nasopharyngeal Nasopharyngeal Swab     Status: Abnormal   Collection Time: 10/10/2018 10:19 AM   Specimen: Nasopharyngeal Swab  Result Value Ref Range Status   SARS Coronavirus 2 POSITIVE (A) NEGATIVE Final    Comment: RESULT CALLED TO, READ BACK BY AND VERIFIED WITH: C.KEMP AT 1229 ON [id number] BY THOMPSON S. Performed at [facility], [address], [place], [place] [address]   Culture, blood (routine x 2)     Status: None (Preliminary result)   Collection Time: 10/22/2018 11:39 AM   Specimen: Left Antecubital; Blood  Result Value Ref Range Status   Specimen Description LEFT ANTECUBITAL BOTTLES DRAWN AEROBIC ONLY  Final   Special Requests Blood Culture adequate volume  Final   Culture   Final    NO GROWTH 2 DAYS Performed at [facility], [address], [place], [place] [address]    Report Status PENDING  Incomplete  Culture, blood (routine x 2)     Status: None (Preliminary result)   Collection  Time: 10/23/2018  3:07 PM   Specimen: BLOOD RIGHT ARM  Result Value Ref Range Status   Specimen Description BLOOD RIGHT ARM  Final   Special Requests   Final    BOTTLES  DRAWN AEROBIC AND ANAEROBIC Blood Culture adequate volume   Culture   Final    NO GROWTH 2 DAYS Performed at [facility], [address], [place], [place] [address]    Report Status PENDING  Incomplete         Radiology Studies: Dg Chest Port 1 View  Result Date: 10/02/2018 CLINICAL DATA:  Encounter for PICC placement EXAM: PORTABLE CHEST 1 VIEW COMPARISON:  Chest radiograph 10/26/2018 FINDINGS: Left chest pacer in place. Interval placement of a right upper extremity PICC with tip projecting over the cavoatrial junction. Stable enlarged cardiomediastinal contours. Improved aeration in the bilateral lungs. There is central venous congestion and bilateral diffuse interstitial opacities likely representing pulmonary edema versus atypical infection. Bibasilar atelectasis and probable small bilateral pleural effusions. No pneumothorax. IMPRESSION: 1. Interval placement of right upper extremity PICC with tip projecting over the cavoatrial junction. 2. Improved aeration of the bilateral lungs with bilateral predominantly interstitial opacities, may represent edema versus atypical infection. Electronically Signed   By: Audie Pinto M.D.   On: 10/02/2018 16:59   Vas [place] Upper Extremity Venous Duplex  Result Date: 10/02/2018 UPPER VENOUS STUDY  Indications: Swelling Risk Factors: None identified. Limitations: Patient movement, bandages and line. Comparison Study: No prior studies. Performing Technologist: Oliver Hum RVT  Examination Guidelines: A complete evaluation includes B-mode imaging, spectral Doppler, color Doppler, and power Doppler as needed of all accessible portions of each vessel. Bilateral testing is considered an integral part of a complete examination. Limited examinations for reoccurring indications may be performed  as noted.  Right Findings: +----------+------------+---------+-----------+----------+-------+ RIGHT     CompressiblePhasicitySpontaneousPropertiesSummary +----------+------------+---------+-----------+----------+-------+ IJV           Full       Yes       Yes                      +----------+------------+---------+-----------+----------+-------+ Subclavian    Full       Yes       Yes                      +----------+------------+---------+-----------+----------+-------+ Axillary      Full       Yes       Yes                      +----------+------------+---------+-----------+----------+-------+ Brachial      Full       Yes       Yes                      +----------+------------+---------+-----------+----------+-------+ Radial        Full                                          +----------+------------+---------+-----------+----------+-------+ Ulnar         Full                                          +----------+------------+---------+-----------+----------+-------+ Cephalic      Full                                          +----------+------------+---------+-----------+----------+-------+  Basilic       Full                                          +----------+------------+---------+-----------+----------+-------+ Arterial flow detected in the radial and ulnar arteries demonstrated triphasic waveforms.  Left Findings: +----------+------------+---------+-----------+----------+-------+ LEFT      CompressiblePhasicitySpontaneousPropertiesSummary +----------+------------+---------+-----------+----------+-------+ IJV           Full       Yes       Yes                      +----------+------------+---------+-----------+----------+-------+ Subclavian    Full       Yes       Yes                      +----------+------------+---------+-----------+----------+-------+ Axillary      Full       Yes       Yes                       +----------+------------+---------+-----------+----------+-------+ Brachial      Full       Yes       Yes                      +----------+------------+---------+-----------+----------+-------+ Radial        Full                                          +----------+------------+---------+-----------+----------+-------+ Ulnar         Full                                          +----------+------------+---------+-----------+----------+-------+ Cephalic      Full                                          +----------+------------+---------+-----------+----------+-------+ Basilic       Full                                          +----------+------------+---------+-----------+----------+-------+ Arterial flow detected in the radial and ulnar arteries demonstrated triphasic waveforms.  Summary:  Right: No evidence of deep vein thrombosis in the upper extremity. No evidence of superficial vein thrombosis in the upper extremity.  Left: No evidence of deep vein thrombosis in the upper extremity. No evidence of superficial vein thrombosis in the upper extremity.  *See table(s) above for measurements and observations.    Preliminary    [place] Ekg Site Rite  Result Date: 10/02/2018 If Site Rite image not attached, placement could not be confirmed due to current cardiac rhythm.       Scheduled Meds: . atorvastatin  10 mg Oral q1800  . dexamethasone (DECADRON) injection  6 mg Intravenous Q24H  . diltiazem  30 mg Oral Q6H  . donepezil  10 mg Oral QHS  . furosemide  60 mg Intravenous Once  .  Ipratropium-Albuterol  1 puff Inhalation QID  . levothyroxine  75 mcg Oral Daily  . memantine  10 mg Oral BID  . Rivaroxaban  15 mg Oral Q supper  . sodium chloride flush  10-40 mL Intracatheter Q12H  . vitamin C  500 mg Oral Daily  . zinc sulfate  220 mg Oral Daily   Continuous Infusions: . dextrose 75 mL/hr at 10/03/18 1416  . remdesivir 100 mg in NS 250 mL 100 mg (10/03/18 0920)      LOS: 2 days   The patient is critically ill with multiple organ systems failure and requires high complexity decision making for assessment and support, frequent evaluation and titration of therapies, application of advanced monitoring technologies and extensive interpretation of multiple databases. Critical Care Time devoted to patient care services described in this note  Time spent: 40 minutes     WOODS, Geraldo Docker, MD [facility] Pager [phone]  If 7PM-7AM, please contact night-coverage [email or web address] Password [facility] 10/03/2018, 4:24 PM

## 2018-10-03 NOTE — Progress Notes (Signed)
Patient underwent TTE yesterday and report is completed but is not transferring to Epic.  Impressions are copied below

## 2018-10-04 ENCOUNTER — Inpatient Hospital Stay (HOSPITAL_COMMUNITY): Payer: PPO

## 2018-10-04 DIAGNOSIS — G300 Alzheimer's disease with early onset: Secondary | ICD-10-CM

## 2018-10-04 DIAGNOSIS — F0280 Dementia in other diseases classified elsewhere without behavioral disturbance: Secondary | ICD-10-CM

## 2018-10-04 DIAGNOSIS — I4891 Unspecified atrial fibrillation: Secondary | ICD-10-CM

## 2018-10-04 DIAGNOSIS — I5043 Acute on chronic combined systolic (congestive) and diastolic (congestive) heart failure: Secondary | ICD-10-CM

## 2018-10-04 LAB — COMPREHENSIVE METABOLIC PANEL
ALT: 32 U/L (ref 0–44)
AST: 37 U/L (ref 15–41)
Albumin: 3.4 g/dL — ABNORMAL LOW (ref 3.5–5.0)
Alkaline Phosphatase: 71 U/L (ref 38–126)
Anion gap: 11 (ref 5–15)
BUN: 70 mg/dL — ABNORMAL HIGH (ref 8–23)
CO2: 40 mmol/L — ABNORMAL HIGH (ref 22–32)
Calcium: 8.8 mg/dL — ABNORMAL LOW (ref 8.9–10.3)
Chloride: 94 mmol/L — ABNORMAL LOW (ref 98–111)
Creatinine, Ser: 1.65 mg/dL — ABNORMAL HIGH (ref 0.44–1.00)
GFR calc Af Amer: 33 mL/min — ABNORMAL LOW (ref >60)
GFR calc non Af Amer: 28 mL/min — ABNORMAL LOW (ref >60)
Glucose, Bld: 224 mg/dL — ABNORMAL HIGH (ref 70–99)
Potassium: 4.5 mmol/L (ref 3.5–5.1)
Sodium: 145 mmol/L (ref 135–145)
Total Bilirubin: 0.6 mg/dL (ref 0.3–1.2)
Total Protein: 6.3 g/dL — ABNORMAL LOW (ref 6.5–8.1)

## 2018-10-04 LAB — CBC WITH DIFFERENTIAL/PLATELET
Abs Immature Granulocytes: 0.07 10*3/uL (ref 0.00–0.07)
Basophils Absolute: 0 10*3/uL (ref 0.0–0.1)
Basophils Relative: 0 %
Eosinophils Absolute: 0 10*3/uL (ref 0.0–0.5)
Eosinophils Relative: 0 %
HCT: 40.3 % (ref 36.0–46.0)
Hemoglobin: 10.8 g/dL — ABNORMAL LOW (ref 12.0–15.0)
Immature Granulocytes: 1 %
Lymphocytes Relative: 4 %
Lymphs Abs: 0.5 10*3/uL — ABNORMAL LOW (ref 0.7–4.0)
MCH: 27.7 pg (ref 26.0–34.0)
MCHC: 26.8 g/dL — ABNORMAL LOW (ref 30.0–36.0)
MCV: 103.3 fL — ABNORMAL HIGH (ref 80.0–100.0)
Monocytes Absolute: 1.3 10*3/uL — ABNORMAL HIGH (ref 0.1–1.0)
Monocytes Relative: 11 %
Neutro Abs: 9.6 10*3/uL — ABNORMAL HIGH (ref 1.7–7.7)
Neutrophils Relative %: 84 %
Platelets: 235 10*3/uL (ref 150–400)
RBC: 3.9 MIL/uL (ref 3.87–5.11)
RDW: 16.2 % — ABNORMAL HIGH (ref 11.5–15.5)
WBC: 11.5 10*3/uL — ABNORMAL HIGH (ref 4.0–10.5)
nRBC: 0.6 % — ABNORMAL HIGH (ref 0.0–0.2)

## 2018-10-04 LAB — FERRITIN: Ferritin: 54 ng/mL (ref 11–307)

## 2018-10-04 LAB — MAGNESIUM: Magnesium: 2.3 mg/dL (ref 1.7–2.4)

## 2018-10-04 LAB — C-REACTIVE PROTEIN: CRP: 1.7 mg/dL — ABNORMAL HIGH (ref <1.0)

## 2018-10-04 LAB — D-DIMER, QUANTITATIVE: D-Dimer, Quant: 2.61 ug/mL-FEU — ABNORMAL HIGH (ref 0.00–0.50)

## 2018-10-04 LAB — PHOSPHORUS: Phosphorus: 5.7 mg/dL — ABNORMAL HIGH (ref 2.5–4.6)

## 2018-10-04 MED ORDER — MORPHINE SULFATE (PF) 2 MG/ML IV SOLN
1.0000 mg | INTRAVENOUS | Status: DC | PRN
  Administered 2018-10-04: 1 mg via INTRAVENOUS
  Filled 2018-10-04 (×2): qty 1

## 2018-10-04 MED ORDER — FUROSEMIDE 10 MG/ML IJ SOLN
80.0000 mg | Freq: Two times a day (BID) | INTRAMUSCULAR | Status: DC
  Administered 2018-10-04 – 2018-10-05 (×3): 80 mg via INTRAVENOUS
  Filled 2018-10-04 (×3): qty 8

## 2018-10-04 MED ORDER — RESOURCE THICKENUP CLEAR PO POWD
ORAL | Status: DC | PRN
  Filled 2018-10-04: qty 125

## 2018-10-04 MED ORDER — LORAZEPAM 2 MG/ML IJ SOLN
0.5000 mg | INTRAMUSCULAR | Status: DC | PRN
  Administered 2018-10-04: 0.5 mg via INTRAVENOUS
  Filled 2018-10-04: qty 1

## 2018-10-04 MED ORDER — MORPHINE SULFATE (CONCENTRATE) 10 MG/0.5ML PO SOLN
5.0000 mg | ORAL | Status: DC | PRN
  Administered 2018-10-04: 21:00:00 5 mg via SUBLINGUAL
  Filled 2018-10-04: qty 0.5

## 2018-10-04 NOTE — Progress Notes (Signed)
Pt still very agitated, yelling "help me, help me" pulling at tele wires and such. Ativan 0.5mg  given

## 2018-10-04 NOTE — Progress Notes (Signed)
Facetimed with pt's granddaughter Mela.

## 2018-10-04 NOTE — Progress Notes (Signed)
Notified Dr Sloan Leiter via epic chat that pt has had 175 ml urine output, has 100 ml urine on her bladder scan, has started calling for people who are not present in the room and has a rectal temp of 95.18F. MD states that he will call this RN back in a few minutes. Awaiting call back.

## 2018-10-04 NOTE — Plan of Care (Signed)

## 2018-10-04 NOTE — Progress Notes (Signed)
Pt's granddaughter able to facetime with pt. Pt able to speak minimally to family.

## 2018-10-04 NOTE — Progress Notes (Addendum)
Called pt's daughter, Jenny Reichmann to give update. Pt able to facetime with her daughter.

## 2018-10-04 NOTE — Evaluation (Signed)
Clinical/Bedside Swallow Evaluation Patient Details  Name: [name] MRN: [id number] Date of Birth: May 08, 1933  Today's Date: 10/04/2018 Time: SLP Start Time (ACUTE ONLY): 0946 SLP Stop Time (ACUTE ONLY): 0956 SLP Time Calculation (min) (ACUTE ONLY): 10 min  Past Medical History:  Past Medical History:  Diagnosis Date  . Chronic lung disease    on home O2   . Complete heart block (HCC)    s/p PPM by Dr Rayann Heman  . Debilitated   . Diastolic dysfunction   . DNR (do not resuscitate)   . Hypertension   . Lymphedema of right lower extremity   . MI (mitral incompetence)   . Obesity   . Permanent atrial fibrillation   . Pulmonary hypertension ([place])   . Rheumatoid arthritis(714.0)   . Sleep apnea   . Stroke [facility])    remote  . Venous insufficiency    with chronic leg ulcers   Past Surgical History:  Past Surgical History:  Procedure Laterality Date  . PACEMAKER INSERTION  08/13/10   SJM by Dr Rayann Heman   HPI:  Colleen Wergin Richardsonis a 85 y.o.WF PMHx chronic respiratory failure (4 L O2 at home), Alzheimer's, stroke, permanent atrial fibrillation on Xarelto, systolic and diastolic CHF with Pacer , HTN, OSA, pulmonary HTN. Diagnosed with Covid 19, acute on chronic respiratory failure with hypoxia. Noted to orally hold and spit out food.    Assessment / Plan / Recommendation Clinical Impression  Pt alert but at times inattentive during evaluation. Need total assist feeding and verbal cues to initiate straw sips or initiate swallow. She would only orally accpet tiny tastes of puree. No signs of aspiration observed but given oral dysphagia and cognitive impairment as well as report of spitting and oral holding, will downgrade diet to puree and nectar thick liquids with meds crushed until pt more stable and attentive.  SLP Visit Diagnosis: Dysphagia, oral phase (R13.11)    Aspiration Risk  Moderate aspiration risk;Risk for inadequate nutrition/hydration    Diet Recommendation Dysphagia  1 (Puree);Nectar-thick liquid   Liquid Administration via: Straw;Spoon Medication Administration: Crushed with puree Supervision: Full supervision/cueing for compensatory strategies Compensations: Slow rate;Small sips/bites Postural Changes: Seated upright at 90 degrees    Other  Recommendations Oral Care Recommendations: Oral care BID Other Recommendations: Have oral suction available   Follow up Recommendations Skilled Nursing facility      Frequency and Duration min 2x/week  2 weeks       Prognosis Prognosis for Safe Diet Advancement: Fair Barriers to Reach Goals: Cognitive deficits      Swallow Study   General HPI: Colleen Wiebold Richardsonis a 85 y.o.WF PMHx chronic respiratory failure (4 L O2 at home), Alzheimer's, stroke, permanent atrial fibrillation on Xarelto, systolic and diastolic CHF with Pacer , HTN, OSA, pulmonary HTN. Diagnosed with Covid 19, acute on chronic respiratory failure with hypoxia. Noted to orally hold and spit out food.  Type of Study: Bedside Swallow Evaluation Previous Swallow Assessment: non ein chart Diet Prior to this Study: Regular;Thin liquids Temperature Spikes Noted: No Respiratory Status: Nasal cannula History of Recent Intubation: No Behavior/Cognition: Confused Oral Cavity Assessment: Within Functional Limits Oral Care Completed by SLP: No Oral Cavity - Dentition: Edentulous Vision: Impaired for self-feeding Self-Feeding Abilities: Total assist Patient Positioning: Upright in bed Baseline Vocal Quality: Normal Volitional Cough: Cognitively unable to elicit Volitional Swallow: Unable to elicit    Oral/Motor/Sensory Function Overall Oral Motor/Sensory Function: Within functional limits   Ice Chips Ice chips: Within functional limits Presentation:  Spoon   Thin Liquid Thin Liquid: Impaired Presentation: Straw Oral Phase Impairments: Poor awareness of bolus Oral Phase Functional Implications: Oral holding    Nectar Thick Nectar Thick  Liquid: Impaired Oral phase functional implications: Oral holding   Honey Thick Honey Thick Liquid: Not tested   Puree Puree: Impaired Presentation: Spoon Oral Phase Functional Implications: Prolonged oral transit   Solid     Solid: Not tested     Herbie Baltimore, MA CCC-SLP  Acute Rehabilitation Services Pager [phone] Office [phone]  Lynann Beaver 10/04/2018,10:08 AM

## 2018-10-04 NOTE — Progress Notes (Signed)
PROGRESS NOTE                                                                                                                                                                                                             Patient Demographics:    [name], is a 85 y.o. female, DOB - 1933-03-25, [id number]  Outpatient Primary MD for the patient is Hilbert Corrigan, MD   Admit date - 10/24/2018   LOS - 3  Chief Complaint  Patient presents with   Shortness of Breath       Brief Narrative: Patient is a 85 y.o. female with PMHx of chronic hypoxemic respiratory failure on 4 L of oxygen at home, chronic combined systolic and diastolic heart failure, permanent atrial fibrillation on Xarelto, pacemaker in place, HTN-presented to the hospital with worsening shortness of breath-she was found to have acute hypoxemic respiratory failure secondary to COVID-19 pneumonia and admitted to the hospitalist service.  See below for further details.   Subjective:    [name] today remains on 5-6 L of oxygen.  She is remains somewhat tachypneic but not in any distress.  She is slightly lethargic-but is able to answer simple questions yes and no.   Assessment  & Plan :   Acute Hypoxic Resp Failure due to Covid 19 Viral pneumonia: remains tenuous-6 L of high flow oxygen-continue steroids and Remdesivir.  See discussion below (palliative care)  Fever: afebrile  O2 requirements: On 6 /m   COVID-19 Labs: Recent Labs    09/30/2018 1507 10/02/18 0416 10/03/18 0600 10/04/18 0230  DDIMER 1.76* 1.86* 2.88* 2.61*  FERRITIN 46 45 46 54  LDH 198*  --   --   --   CRP 2.1* 2.0* 1.4* 1.7*    Lab Results  Component Value Date   SARSCOV2NAA POSITIVE (A) 09/29/2018     COVID-19 Medications: Steroids:9/4>> Remdesivir:9/5>> Actemra: Not given Convalescent Plasma: Not given Research Studies:N/A  Other  medications: Diuretics: Significant edema in the thighs-start scheduled Lasix Antibiotics:Not needed as no evidence of bacterial infection  DVT Prophylaxis  : Xarelto  Acute on chronic combined systolic and diastolic heart failure (EF 30-35% by echo on 9/6): Volume overloaded-decrease IV fluids-start Lasix.  Watch renal function.  Difficult situation-given poor oral intake and borderline hyponatremia.  AKI on CKD stage III: AKI likely hemodynamically mediated-she  appears volume overloaded on exam this morning-starting IV Lasix.  Dysphagia: Secondary to severe deconditioning/acute illness-speech therapy following-on dysphagia 1 diet.  Permanent atrial fibrillation: Rate controlled-continue Cardizem and Xarelto  History of complete heart block-s/p PPM  Pulmonary hypertension: Probably related to chronic lung disease, diastolic heart failure-stable for patient.  COPD: On home O2-4 L/min.  Hypothyroidism: Continue Synthroid  Alzheimer's dementia: Suspect not far from baseline-but lethargic due to acute illness-continue Aricept and Namenda  Obesity: Estimated body mass index is 35.13 kg/m as calculated from the following:   Height as of this encounter: 4\' 11"  (1.499 m).   Weight as of this encounter: 78.9 kg.   Palliative care: She remains extremely tenuous-she has significant medical comorbidities-and is currently slowly declining inspite of appropriate care with steroids/Remdesivir and Lasix.  Long discussion with patient's daughter [name]) over the phone-we discussed various options including transitioning to comfort care at this point versus continuing treatment.  For now-daughter wants to continue treatment-however deteriorates any further-she is open to transitioning her to full comfort measures.  DNR in place.  ABG:    Component Value Date/Time   PHART 7.431 03/01/2015 1450   PCO2ART 47.2 (H) 03/01/2015 1450   PO2ART 79.0 (L) 03/01/2015 1450   HCO3 29.9 (H) 03/01/2015  1450   TCO2 21 08/12/2010 1258   O2SAT 95.5 03/01/2015 1450    Vent Settings:    Condition - Extremely Guarded-very tenuous with risk for further deterioration  Family Communication  : Daughter pdated over the phone  Code Status :  DNR  Diet :  Diet Order            DIET - DYS 1 Room service appropriate? Yes; Fluid consistency: Nectar Thick  Diet effective now               Disposition Plan  :  Remain hospitalized  Consults  :  None  Procedures  :  None  Antibiotics  :    Anti-infectives (From admission, onward)   Start     Dose/Rate Route Frequency Ordered Stop   10/03/18 0800  remdesivir 100 mg in sodium chloride 0.9 % 250 mL IVPB     100 mg 500 mL/hr over 30 Minutes Intravenous Every 24 hours 10/02/18 0622 10/07/18 0759   10/02/18 0800  remdesivir 200 mg in sodium chloride 0.9 % 250 mL IVPB     200 mg 500 mL/hr over 30 Minutes Intravenous Once 10/02/18 0622 10/02/18 1740      Inpatient Medications  Scheduled Meds:  atorvastatin  10 mg Oral q1800   dexamethasone (DECADRON) injection  6 mg Intravenous Q24H   diltiazem  30 mg Oral Q6H   donepezil  10 mg Oral QHS   furosemide  80 mg Intravenous BID   Ipratropium-Albuterol  1 puff Inhalation QID   levothyroxine  75 mcg Oral Daily   memantine  10 mg Oral BID   Rivaroxaban  15 mg Oral Q supper   sodium chloride flush  10-40 mL Intracatheter Q12H   vitamin C  500 mg Oral Daily   zinc sulfate  220 mg Oral Daily   Continuous Infusions:  dextrose 20 mL/hr at 10/04/18 1014   remdesivir 100 mg in NS 250 mL 100 mg (10/04/18 1013)   PRN Meds:.albuterol, bisacodyl, chlorpheniramine-HYDROcodone, guaiFENesin-dextromethorphan, LORazepam, ondansetron **OR** ondansetron (ZOFRAN) IV, Resource ThickenUp Clear, sodium chloride flush   Time Spent in minutes  35    See all Orders from today for further details   [employer]  M.D on 10/04/2018 at 11:07 AM  To page go to [email or web address] - use universal  password  [facility] -  Office  [phone]    Objective:   Vitals:   10/04/18 0000 10/04/18 0400 10/04/18 0743 10/04/18 0800  BP: (!) 118/33 (!) 131/92  (!) 128/46  Pulse:   69 68  Resp:   (!) 28 (!) 27  Temp: 98.1 F (36.7 C) 98 F (36.7 C)  97.8 F (36.6 C)  TempSrc: Oral Axillary    SpO2: 97%  92% 94%  Weight:      Height:        Wt Readings from Last 3 Encounters:  10/03/18 78.9 kg  09/08/18 87.5 kg  07/28/18 85.7 kg     Intake/Output Summary (Last 24 hours) at 10/04/2018 1107 Last data filed at 10/04/2018 0300 Gross per 24 hour  Intake 920 ml  Output 850 ml  Net 70 ml     Physical Exam Gen Exam:Alert awake-slightly tachypneic-but not in any distress HEENT:atraumatic, normocephalic Chest: Bilateral rales up to midlung CVS:S1S2 regular Abdomen:soft non tender, non distended Extremities:2+ edema in the thighs  Neurology: Non focal-but with significant amount of generalized weakness Skin: no rash   Data Review:    CBC Recent Labs  Lab 10/16/2018 1507 10/02/18 0416 10/03/18 0600 10/04/18 0230  WBC 8.3 7.5 8.1 11.5*  HGB 10.6* 11.0* 10.4* 10.8*  HCT 40.4 41.5 39.7 40.3  PLT 210 196 212 235  MCV 103.3* 104.0* 102.3* 103.3*  MCH 27.1 27.6 26.8 27.7  MCHC 26.2* 26.5* 26.2* 26.8*  RDW 16.3* 16.1* 16.2* 16.2*  LYMPHSABS 1.3 0.3* 0.6* 0.5*  MONOABS 0.7 0.2 0.9 1.3*  EOSABS 0.0 0.0 0.0 0.0  BASOSABS 0.0 0.0 0.0 0.0    Chemistries  Recent Labs  Lab 09/30/2018 1507 10/02/18 0416 10/02/18 2057 10/03/18 0600 10/04/18 0230  NA 146* 147* 150* 148* 145  K 5.6* 5.5* 4.9 4.8 4.5  CL 98 97* 97* 94* 94*  CO2 37* 35* 37* 39* 40*  GLUCOSE 83 87 98 160* 224*  BUN 48* 52* 58* 62* 70*  CREATININE 1.53* 1.61* 1.62* 1.50* 1.65*  CALCIUM 8.3* 8.5* 8.7* 8.7* 8.8*  MG  --  2.4  --  2.2 2.3  AST 53* 50*  --  40 37  ALT 35 35  --  31 32  ALKPHOS 73 77  --  68 71  BILITOT 0.4 0.9  --  0.5 0.6    ------------------------------------------------------------------------------------------------------------------ Recent Labs    10/02/2018 1507  TRIG 134    No results found for: HGBA1C ------------------------------------------------------------------------------------------------------------------ No results for input(s): TSH, T4TOTAL, T3FREE, THYROIDAB in the last 72 hours.  Invalid input(s): FREET3 ------------------------------------------------------------------------------------------------------------------ Recent Labs    10/03/18 0600 10/04/18 0230  FERRITIN 46 54    Coagulation profile Recent Labs  Lab 10/06/2018 1507  INR 1.1    Recent Labs    10/03/18 0600 10/04/18 0230  DDIMER 2.88* 2.61*    Cardiac Enzymes No results for input(s): CKMB, TROPONINI, MYOGLOBIN in the last 168 hours.  Invalid input(s): CK ------------------------------------------------------------------------------------------------------------------    Component Value Date/Time   BNP 1,110.6 (H) 10/02/2018 2057    Micro Results Recent Results (from the past 240 hour(s))  Urine culture     Status: None   Collection Time: 10/10/2018 10:19 AM   Specimen: Urine, Catheterized  Result Value Ref Range Status   Specimen Description   Final    URINE, CATHETERIZED Performed at [facility], [address], [place], [name]  [address]    Special Requests   Final    NONE Performed at [facility], [address] [place], [place] [address]    Culture   Final    NO GROWTH Performed at [facility], [place] [address], [place], [place] [address]    Report Status 10/03/2018 FINAL  Final  SARS Coronavirus 2 [facility] order, Performed in [facility] hospital lab) Nasopharyngeal Nasopharyngeal Swab     Status: Abnormal   Collection Time: 10/04/2018 10:19 AM   Specimen: Nasopharyngeal Swab  Result Value Ref Range Status   SARS Coronavirus 2 POSITIVE (A) NEGATIVE Final    Comment: RESULT  CALLED TO, READ BACK BY AND VERIFIED WITH: C.KEMP AT 1229 ON [id number] BY THOMPSON S. Performed at [facility], [address] [place], [place] [address]   Culture, blood (routine x 2)     Status: None (Preliminary result)   Collection Time: 09/28/2018 11:39 AM   Specimen: Left Antecubital; Blood  Result Value Ref Range Status   Specimen Description LEFT ANTECUBITAL BOTTLES DRAWN AEROBIC ONLY  Final   Special Requests Blood Culture adequate volume  Final   Culture   Final    NO GROWTH 3 DAYS Performed at [facility], [address] [place], [place] [address]    Report Status PENDING  Incomplete  Culture, blood (routine x 2)     Status: None (Preliminary result)   Collection Time: 10/10/2018  3:07 PM   Specimen: BLOOD RIGHT ARM  Result Value Ref Range Status   Specimen Description BLOOD RIGHT ARM  Final   Special Requests   Final    BOTTLES DRAWN AEROBIC AND ANAEROBIC Blood Culture adequate volume   Culture   Final    NO GROWTH 3 DAYS Performed at [facility], [address], [place], [place] [address]    Report Status PENDING  Incomplete    Radiology Reports Dg Chest Port 1 View  Result Date: 10/02/2018 CLINICAL DATA:  Encounter for PICC placement EXAM: PORTABLE CHEST 1 VIEW COMPARISON:  Chest radiograph 10/09/2018 FINDINGS: Left chest pacer in place. Interval placement of a right upper extremity PICC with tip projecting over the cavoatrial junction. Stable enlarged cardiomediastinal contours. Improved aeration in the bilateral lungs. There is central venous congestion and bilateral diffuse interstitial opacities likely representing pulmonary edema versus atypical infection. Bibasilar atelectasis and probable small bilateral pleural effusions. No pneumothorax. IMPRESSION: 1. Interval placement of right upper extremity PICC with tip projecting over the cavoatrial junction. 2. Improved aeration of the bilateral lungs with bilateral predominantly interstitial opacities, may represent edema  versus atypical infection. Electronically Signed   By: Audie Pinto M.D.   On: 10/02/2018 16:59   Dg Chest Port 1 View  Result Date: 10/21/2018 CLINICAL DATA:  Short of breath.  COVID-19 positive EXAM: PORTABLE CHEST 1 VIEW COMPARISON:  05/11/2017 FINDINGS: Marked cardiac enlargement.  Single lead pacemaker unchanged. Interval development of diffuse bilateral airspace disease right greater than left. Small pleural effusions and bibasilar atelectasis. IMPRESSION: Interval development of diffuse bilateral airspace disease. This could represent congestive heart failure and edema versus pneumonia. Electronically Signed   By: Franchot Gallo M.D.   On: 10/15/2018 11:07   Vas [place] Upper Extremity Venous Duplex  Result Date: 10/04/2018 UPPER VENOUS STUDY  Indications: Swelling Risk Factors: None identified. Limitations: Patient movement, bandages and line. Comparison Study: No prior studies. Performing Technologist: Oliver Hum RVT  Examination Guidelines: A complete evaluation includes B-mode imaging, spectral Doppler, color Doppler, and power Doppler as  needed of all accessible portions of each vessel. Bilateral testing is considered an integral part of a complete examination. Limited examinations for reoccurring indications may be performed as noted.  Right Findings: +----------+------------+---------+-----------+----------+-------+  RIGHT      Compressible Phasicity Spontaneous Properties Summary  +----------+------------+---------+-----------+----------+-------+  IJV            Full        Yes        Yes                         +----------+------------+---------+-----------+----------+-------+  Subclavian     Full        Yes        Yes                         +----------+------------+---------+-----------+----------+-------+  Axillary       Full        Yes        Yes                         +----------+------------+---------+-----------+----------+-------+  Brachial       Full        Yes        Yes                          +----------+------------+---------+-----------+----------+-------+  Radial         Full                                               +----------+------------+---------+-----------+----------+-------+  Ulnar          Full                                               +----------+------------+---------+-----------+----------+-------+  Cephalic       Full                                               +----------+------------+---------+-----------+----------+-------+  Basilic        Full                                               +----------+------------+---------+-----------+----------+-------+ Arterial flow detected in the radial and ulnar arteries demonstrated triphasic waveforms.  Left Findings: +----------+------------+---------+-----------+----------+-------+  LEFT       Compressible Phasicity Spontaneous Properties Summary  +----------+------------+---------+-----------+----------+-------+  IJV            Full        Yes        Yes                         +----------+------------+---------+-----------+----------+-------+  Subclavian     Full        Yes        Yes                         +----------+------------+---------+-----------+----------+-------+  Axillary       Full        Yes        Yes                         +----------+------------+---------+-----------+----------+-------+  Brachial       Full        Yes        Yes                         +----------+------------+---------+-----------+----------+-------+  Radial         Full                                               +----------+------------+---------+-----------+----------+-------+  Ulnar          Full                                               +----------+------------+---------+-----------+----------+-------+  Cephalic       Full                                               +----------+------------+---------+-----------+----------+-------+  Basilic        Full                                                +----------+------------+---------+-----------+----------+-------+ Arterial flow detected in the radial and ulnar arteries demonstrated triphasic waveforms.  Summary:  Right: No evidence of deep vein thrombosis in the upper extremity. No evidence of superficial vein thrombosis in the upper extremity.  Left: No evidence of deep vein thrombosis in the upper extremity. No evidence of superficial vein thrombosis in the upper extremity.  *See table(s) above for measurements and observations.  Diagnosing physician: Harold Barban MD Electronically signed by Harold Barban MD on 10/04/2018 at 1:13:26 AM.    Final    [place] Ekg Site Rite  Result Date: 10/02/2018 If Site Rite image not attached, placement could not be confirmed due to current cardiac rhythm.

## 2018-10-04 NOTE — Progress Notes (Signed)
No changes from last assessment. Dr Bridgett Larsson is aware

## 2018-10-04 NOTE — Progress Notes (Signed)
Pt able to swallow 2 small bites of applesauce with meds. Provided pt with water to drink and pt took a small sip via spoon. Pt unable to eat breakfast, spitting food out and pocketing a 2nd bite.

## 2018-10-04 NOTE — Progress Notes (Signed)
Set up a time with pt's other granddaughter to facetime. She will call RN at 3pm

## 2018-10-04 NOTE — Progress Notes (Signed)
Pts RR in the 30's, she is very agitated. Morphine PO given

## 2018-10-05 LAB — COMPREHENSIVE METABOLIC PANEL
ALT: 31 U/L (ref 0–44)
AST: 31 U/L (ref 15–41)
Albumin: 3.3 g/dL — ABNORMAL LOW (ref 3.5–5.0)
Alkaline Phosphatase: 68 U/L (ref 38–126)
Anion gap: 13 (ref 5–15)
BUN: 75 mg/dL — ABNORMAL HIGH (ref 8–23)
CO2: 38 mmol/L — ABNORMAL HIGH (ref 22–32)
Calcium: 8.4 mg/dL — ABNORMAL LOW (ref 8.9–10.3)
Chloride: 93 mmol/L — ABNORMAL LOW (ref 98–111)
Creatinine, Ser: 1.68 mg/dL — ABNORMAL HIGH (ref 0.44–1.00)
GFR calc Af Amer: 32 mL/min — ABNORMAL LOW (ref >60)
GFR calc non Af Amer: 28 mL/min — ABNORMAL LOW (ref >60)
Glucose, Bld: 134 mg/dL — ABNORMAL HIGH (ref 70–99)
Potassium: 4 mmol/L (ref 3.5–5.1)
Sodium: 144 mmol/L (ref 135–145)
Total Bilirubin: 1.1 mg/dL (ref 0.3–1.2)
Total Protein: 5.8 g/dL — ABNORMAL LOW (ref 6.5–8.1)

## 2018-10-05 LAB — C-REACTIVE PROTEIN: CRP: 2.5 mg/dL — ABNORMAL HIGH (ref <1.0)

## 2018-10-05 LAB — CBC WITH DIFFERENTIAL/PLATELET
Abs Immature Granulocytes: 0.12 10*3/uL — ABNORMAL HIGH (ref 0.00–0.07)
Basophils Absolute: 0 10*3/uL (ref 0.0–0.1)
Basophils Relative: 0 %
Eosinophils Absolute: 0 10*3/uL (ref 0.0–0.5)
Eosinophils Relative: 0 %
HCT: 40.9 % (ref 36.0–46.0)
Hemoglobin: 10.9 g/dL — ABNORMAL LOW (ref 12.0–15.0)
Immature Granulocytes: 1 %
Lymphocytes Relative: 5 %
Lymphs Abs: 0.6 10*3/uL — ABNORMAL LOW (ref 0.7–4.0)
MCH: 27.3 pg (ref 26.0–34.0)
MCHC: 26.7 g/dL — ABNORMAL LOW (ref 30.0–36.0)
MCV: 102.5 fL — ABNORMAL HIGH (ref 80.0–100.0)
Monocytes Absolute: 1 10*3/uL (ref 0.1–1.0)
Monocytes Relative: 8 %
Neutro Abs: 9.8 10*3/uL — ABNORMAL HIGH (ref 1.7–7.7)
Neutrophils Relative %: 86 %
Platelets: 212 10*3/uL (ref 150–400)
RBC: 3.99 MIL/uL (ref 3.87–5.11)
RDW: 15.8 % — ABNORMAL HIGH (ref 11.5–15.5)
WBC: 11.5 10*3/uL — ABNORMAL HIGH (ref 4.0–10.5)
nRBC: 0.3 % — ABNORMAL HIGH (ref 0.0–0.2)

## 2018-10-05 LAB — D-DIMER, QUANTITATIVE: D-Dimer, Quant: 3.37 ug/mL-FEU — ABNORMAL HIGH (ref 0.00–0.50)

## 2018-10-05 LAB — FERRITIN: Ferritin: 57 ng/mL (ref 11–307)

## 2018-10-05 LAB — MAGNESIUM: Magnesium: 2.3 mg/dL (ref 1.7–2.4)

## 2018-10-05 LAB — PHOSPHORUS: Phosphorus: 5.4 mg/dL — ABNORMAL HIGH (ref 2.5–4.6)

## 2018-10-05 MED ORDER — GLYCOPYRROLATE 1 MG PO TABS
1.0000 mg | ORAL_TABLET | ORAL | Status: DC | PRN
  Filled 2018-10-05: qty 1

## 2018-10-05 MED ORDER — NYSTATIN 100000 UNIT/GM EX POWD
Freq: Three times a day (TID) | CUTANEOUS | Status: DC | PRN
  Filled 2018-10-05: qty 15

## 2018-10-05 MED ORDER — SODIUM CHLORIDE 0.9 % IV SOLN
1.0000 mg/h | INTRAVENOUS | Status: DC
  Administered 2018-10-05: 1 mg/h via INTRAVENOUS
  Filled 2018-10-05: qty 5

## 2018-10-05 MED ORDER — HALOPERIDOL LACTATE 5 MG/ML IJ SOLN: 0.5000 mg | INTRAMUSCULAR | Status: DC | PRN

## 2018-10-05 MED ORDER — SODIUM CHLORIDE 0.9% FLUSH
3.0000 mL | Freq: Two times a day (BID) | INTRAVENOUS | Status: DC
  Administered 2018-10-05: 11:00:00 3 mL via INTRAVENOUS

## 2018-10-05 MED ORDER — GLYCOPYRROLATE 0.2 MG/ML IJ SOLN: 0.2000 mg | INTRAMUSCULAR | Status: DC | PRN

## 2018-10-05 MED ORDER — SODIUM CHLORIDE 0.9% FLUSH: 3.0000 mL | INTRAVENOUS | Status: DC | PRN

## 2018-10-05 MED ORDER — ACETAMINOPHEN 325 MG PO TABS: 650.0000 mg | ORAL_TABLET | Freq: Four times a day (QID) | ORAL | Status: DC | PRN

## 2018-10-05 MED ORDER — TRAZODONE HCL 50 MG PO TABS: 25.0000 mg | ORAL_TABLET | Freq: Every evening | ORAL | Status: DC | PRN

## 2018-10-05 MED ORDER — BIOTENE DRY MOUTH MT LIQD: 15.0000 mL | OROMUCOSAL | Status: DC | PRN

## 2018-10-05 MED ORDER — MAGIC MOUTHWASH W/LIDOCAINE
15.0000 mL | Freq: Four times a day (QID) | ORAL | Status: DC | PRN
  Filled 2018-10-05: qty 15

## 2018-10-05 MED ORDER — LORAZEPAM 0.5 MG PO TABS: 1.0000 mg | ORAL_TABLET | ORAL | Status: DC | PRN

## 2018-10-05 MED ORDER — HALOPERIDOL LACTATE 2 MG/ML PO CONC: 0.5000 mg | ORAL | Status: DC | PRN

## 2018-10-05 MED ORDER — HALOPERIDOL 0.5 MG PO TABS
0.5000 mg | ORAL_TABLET | ORAL | Status: DC | PRN
  Filled 2018-10-05: qty 1

## 2018-10-05 MED ORDER — HYDROMORPHONE BOLUS VIA INFUSION
0.5000 mg | INTRAVENOUS | Status: DC | PRN
  Filled 2018-10-05: qty 1

## 2018-10-05 MED ORDER — LORAZEPAM 2 MG/ML IJ SOLN: 1.0000 mg | INTRAMUSCULAR | Status: DC | PRN

## 2018-10-05 MED ORDER — ONDANSETRON HCL 4 MG/2ML IJ SOLN: 4.0000 mg | Freq: Four times a day (QID) | INTRAMUSCULAR | Status: DC | PRN

## 2018-10-05 MED ORDER — ONDANSETRON 4 MG PO TBDP: 4.0000 mg | ORAL_TABLET | Freq: Four times a day (QID) | ORAL | Status: DC | PRN

## 2018-10-05 MED ORDER — ACETAMINOPHEN 650 MG RE SUPP: 650.0000 mg | Freq: Four times a day (QID) | RECTAL | Status: DC | PRN

## 2018-10-05 MED ORDER — SODIUM CHLORIDE 0.9 % IV SOLN: 250.0000 mL | INTRAVENOUS | Status: DC | PRN

## 2018-10-05 MED ORDER — LORAZEPAM 2 MG/ML PO CONC: 1.0000 mg | ORAL | Status: DC | PRN

## 2018-10-05 MED ORDER — POLYVINYL ALCOHOL 1.4 % OP SOLN: 1.0000 [drp] | Freq: Four times a day (QID) | OPHTHALMIC | Status: DC | PRN

## 2018-10-06 LAB — CULTURE, BLOOD (ROUTINE X 2)
Culture: NO GROWTH
Culture: NO GROWTH
Special Requests: ADEQUATE
Special Requests: ADEQUATE

## 2018-10-19 ENCOUNTER — Telehealth: Payer: Self-pay | Admitting: Internal Medicine

## 2018-10-19 NOTE — Telephone Encounter (Signed)
Spoke w/ pt daughter and informed her that she can recycle or throw the home monitor away and that [facility] does not take the monitors back. Pt verbalized understanding.

## 2018-10-19 NOTE — Telephone Encounter (Signed)
Received telephone call from Jenny Reichmann -daughter in regards to patient's pacemaker. She would like for someone to call her at [phone].

## 2018-10-28 NOTE — Progress Notes (Signed)
Pt has been resting off and on t/o the night, O2 sats are above 95% on 5 6L/HFNC, HR in the 60-70's

## 2018-10-28 NOTE — Progress Notes (Signed)
Pt noted to have zero respirations on monitor. RN was able to call pt's daughter and facetime for pt's last moments. Verified by two RN's, Currie Paris and [facility] that no pulse or breathing noted x2 minutes. CN aware. MD notified via epic chat.

## 2018-10-28 NOTE — Plan of Care (Signed)
POC reviewed

## 2018-10-28 NOTE — Death Summary Note (Signed)
DEATH SUMMARY   Patient Details  Name: [name] MRN: [id number] DOB: 1933/05/27  Admission/Discharge Information   Admit Date:  2018-10-19  Date of Death: Date of Death: Oct 23, 2018  Time of Death: Time of Death: May 10, 1243  Length of Stay: 4  Referring Physician: Hilbert Corrigan, MD   Reason(s) for Hospitalization  Acute on chronic hypoxic respiratory failure secondary to COVID-19 pneumonia  Diagnoses  Preliminary cause of death:   Acute on chronic hypoxic respiratory failure secondary to COVID-19 pneumonia  Secondary Diagnoses (including complications and co-morbidities):  Principal Problem:   Acute on chronic respiratory failure with hypoxia ([place]) Active Problems:   Permanent atrial fibrillation   Pacemaker-[employer]   Hypothyroidism   Long term current use of anticoagulant   Alzheimer's disease ([place])   Essential hypertension   PVD (peripheral vascular disease) ([place])   Pneumonia due to COVID-19 virus   Acute on chronic combined systolic and diastolic heart failure ([facility])   Atrial fibrillation with RVR ([place])   Acute on chronic combined systolic and diastolic CHF (congestive heart failure) ([place])   Benign essential HTN   Alzheimer's dementia ([place])   Acute metabolic encephalopathy   Brief Hospital Course (including significant findings, care, treatment, and services provided and events leading to death)  Brief Narrative: Patient is a 85 y.o. female with PMHx of chronic hypoxemic respiratory failure on 4 L of oxygen at home, chronic combined systolic and diastolic heart failure, permanent atrial fibrillation on Xarelto, pacemaker in place, HTN-presented to the hospital with worsening shortness of breath-she was found to have acute hypoxemic respiratory failure secondary to COVID-19 pneumonia and admitted to the hospitalist service.  She was started on steroids and Remdesivir-unfortunately even with maximal supportive care-she continued to deteriorate with worsening  hypoxemia, AKI and worsening encephalopathy.  She was subsequently transitioned to full comfort measures and passed away on Oct 23, 2022 at 12:45 PM. See below for further details  Hospital course by problem list: Acute on chronic hypoxemic respiratory failure secondary to COVID-19 viral pneumonia: Treated with steroids and Remdesivir-unfortunately continued to decline-with worsening oxygen requirement.  After extensive discussion with family, she was transitioned to full comfort measures on October 23, 2022.  Steroids and Remdesivir was discontinued.  Acute on chronic combined systolic and diastolic heart failure (EF 30-35% by echo on 9/6): Was placed on IV Lasix-she remains volume overloaded-very difficult situation as patient hardly had any oral intake over the past few days.  Once she was transitioned to full comfort measures-diuretic therapy was discontinued.  AKI on CKD stage III: AKI likely hemodynamically mediated-this was managed with supportive care.  Dysphagia: Secondary to severe deconditioning/acute illness-remains on a dysphagia 1 diet-speech therapy was following.    Permanent atrial fibrillation: Rate controlled- discontinued Cardizem and Xarelto as full comfort measures was in effect   History of complete heart block-s/p PPM  Pulmonary hypertension: Probably related to chronic lung disease, diastolic heart failure-stable for patient.  COPD: On home O2-4 L/min  Hypothyroidism:  Was on Synthroid-but this was discontinued once patient was transitioned to full comfort measures.  Alzheimer's dementia with superimposed delirium and metabolic encephalopathy: Worsening lethargy/confusion likely from worsening hypoxia in a background of dementia.  Stop Aricept and Namenda-full comfort measures in effect.  Obesity: Estimated body mass index is 35.13 kg/m as calculated from the following:   Height as of this encounter: 4\' 11"  (1.499 m).   Weight as of this encounter: 78.9 kg.    Pertinent  Labs and Studies  Significant Diagnostic Studies Dg Chest Port 1  View  Result Date: 10/02/2018 CLINICAL DATA:  Encounter for PICC placement EXAM: PORTABLE CHEST 1 VIEW COMPARISON:  Chest radiograph 10/14/2018 FINDINGS: Left chest pacer in place. Interval placement of a right upper extremity PICC with tip projecting over the cavoatrial junction. Stable enlarged cardiomediastinal contours. Improved aeration in the bilateral lungs. There is central venous congestion and bilateral diffuse interstitial opacities likely representing pulmonary edema versus atypical infection. Bibasilar atelectasis and probable small bilateral pleural effusions. No pneumothorax. IMPRESSION: 1. Interval placement of right upper extremity PICC with tip projecting over the cavoatrial junction. 2. Improved aeration of the bilateral lungs with bilateral predominantly interstitial opacities, may represent edema versus atypical infection. Electronically Signed   By: Audie Pinto M.D.   On: 10/02/2018 16:59   Dg Chest Port 1 View  Result Date: 10/04/2018 CLINICAL DATA:  Short of breath.  COVID-19 positive EXAM: PORTABLE CHEST 1 VIEW COMPARISON:  05/11/2017 FINDINGS: Marked cardiac enlargement.  Single lead pacemaker unchanged. Interval development of diffuse bilateral airspace disease right greater than left. Small pleural effusions and bibasilar atelectasis. IMPRESSION: Interval development of diffuse bilateral airspace disease. This could represent congestive heart failure and edema versus pneumonia. Electronically Signed   By: Franchot Gallo M.D.   On: 10/27/2018 11:07   Dg Chest Port 1v Same Day  Result Date: 10/04/2018 CLINICAL DATA:  sob EXAM: PORTABLE CHEST 1 VIEW COMPARISON:  10/02/2018 FINDINGS: LEFT-sided transvenous pacemaker lead overlies the RIGHT ventricle. PICC line tip overlies the superior vena cava, stable in appearance. The heart is enlarged and stable in configuration. There is persistent dense opacity at the LEFT  lung base, stable in appearance. Patchy parenchymal opacities throughout the UPPER lobes and RIGHT LOWER lobe also appear stable. IMPRESSION: Stable appearance of the chest. Electronically Signed   By: Nolon Nations M.D.   On: 10/04/2018 11:06   Vas [place] Upper Extremity Venous Duplex  Result Date: 10/04/2018 UPPER VENOUS STUDY  Indications: Swelling Risk Factors: None identified. Limitations: Patient movement, bandages and line. Comparison Study: No prior studies. Performing Technologist: Oliver Hum RVT  Examination Guidelines: A complete evaluation includes B-mode imaging, spectral Doppler, color Doppler, and power Doppler as needed of all accessible portions of each vessel. Bilateral testing is considered an integral part of a complete examination. Limited examinations for reoccurring indications may be performed as noted.  Right Findings: +----------+------------+---------+-----------+----------+-------+ RIGHT     CompressiblePhasicitySpontaneousPropertiesSummary +----------+------------+---------+-----------+----------+-------+ IJV           Full       Yes       Yes                      +----------+------------+---------+-----------+----------+-------+ Subclavian    Full       Yes       Yes                      +----------+------------+---------+-----------+----------+-------+ Axillary      Full       Yes       Yes                      +----------+------------+---------+-----------+----------+-------+ Brachial      Full       Yes       Yes                      +----------+------------+---------+-----------+----------+-------+ Radial        Full                                          +----------+------------+---------+-----------+----------+-------+  Ulnar         Full                                          +----------+------------+---------+-----------+----------+-------+ Cephalic      Full                                           +----------+------------+---------+-----------+----------+-------+ Basilic       Full                                          +----------+------------+---------+-----------+----------+-------+ Arterial flow detected in the radial and ulnar arteries demonstrated triphasic waveforms.  Left Findings: +----------+------------+---------+-----------+----------+-------+ LEFT      CompressiblePhasicitySpontaneousPropertiesSummary +----------+------------+---------+-----------+----------+-------+ IJV           Full       Yes       Yes                      +----------+------------+---------+-----------+----------+-------+ Subclavian    Full       Yes       Yes                      +----------+------------+---------+-----------+----------+-------+ Axillary      Full       Yes       Yes                      +----------+------------+---------+-----------+----------+-------+ Brachial      Full       Yes       Yes                      +----------+------------+---------+-----------+----------+-------+ Radial        Full                                          +----------+------------+---------+-----------+----------+-------+ Ulnar         Full                                          +----------+------------+---------+-----------+----------+-------+ Cephalic      Full                                          +----------+------------+---------+-----------+----------+-------+ Basilic       Full                                          +----------+------------+---------+-----------+----------+-------+ Arterial flow detected in the radial and ulnar arteries demonstrated triphasic waveforms.  Summary:  Right: No evidence of deep vein thrombosis in the upper extremity. No evidence of superficial vein thrombosis in the upper extremity.  Left: No evidence of deep vein thrombosis in the upper  extremity. No evidence of superficial vein thrombosis in the upper extremity.   *See table(s) above for measurements and observations.  Diagnosing physician: Harold Barban MD Electronically signed by Harold Barban MD on 10/04/2018 at 1:13:26 AM.    Final    [place] Ekg Site Rite  Result Date: 10/02/2018 If Site Rite image not attached, placement could not be confirmed due to current cardiac rhythm.   Microbiology Recent Results (from the past 240 hour(s))  Urine culture     Status: None   Collection Time: 10/10/2018 10:19 AM   Specimen: Urine, Catheterized  Result Value Ref Range Status   Specimen Description   Final    URINE, CATHETERIZED Performed at [facility], [address] [place], [place] [address]    Special Requests   Final    NONE Performed at [facility], [address], [place], [place] [address]    Culture   Final    NO GROWTH Performed at [facility], [place] [address], [place], [place] [address]    Report Status 10/03/2018 FINAL  Final  SARS Coronavirus 2 [facility] order, Performed in [facility] hospital lab) Nasopharyngeal Nasopharyngeal Swab     Status: Abnormal   Collection Time: 10/11/2018 10:19 AM   Specimen: Nasopharyngeal Swab  Result Value Ref Range Status   SARS Coronavirus 2 POSITIVE (A) NEGATIVE Final    Comment: RESULT CALLED TO, READ BACK BY AND VERIFIED WITH: C.KEMP AT 1229 ON [id number] BY THOMPSON S. Performed at [facility], [address], [place], [place] [address]   Culture, blood (routine x 2)     Status: None (Preliminary result)   Collection Time: 10/10/2018 11:39 AM   Specimen: Left Antecubital; Blood  Result Value Ref Range Status   Specimen Description LEFT ANTECUBITAL BOTTLES DRAWN AEROBIC ONLY  Final   Special Requests Blood Culture adequate volume  Final   Culture   Final    NO GROWTH 4 DAYS Performed at [facility], [address] [place], [place] [address]    Report Status PENDING  Incomplete  Culture, blood (routine x 2)     Status: None (Preliminary result)   Collection Time: 10/10/2018  3:07 PM   Specimen:  BLOOD RIGHT ARM  Result Value Ref Range Status   Specimen Description BLOOD RIGHT ARM  Final   Special Requests   Final    BOTTLES DRAWN AEROBIC AND ANAEROBIC Blood Culture adequate volume   Culture   Final    NO GROWTH 4 DAYS Performed at [facility], [address], [place], [place] [address]    Report Status PENDING  Incomplete    Lab Basic Metabolic Panel: Recent Labs  Lab 10/02/18 0416 10/02/18 2057 10/03/18 0600 10/04/18 0230 October 07, 2018 0400  NA 147* 150* 148* 145 144  K 5.5* 4.9 4.8 4.5 4.0  CL 97* 97* 94* 94* 93*  CO2 35* 37* 39* 40* 38*  GLUCOSE 87 98 160* 224* 134*  BUN 52* 58* 62* 70* 75*  CREATININE 1.61* 1.62* 1.50* 1.65* 1.68*  CALCIUM 8.5* 8.7* 8.7* 8.8* 8.4*  MG 2.4  --  2.2 2.3 2.3  PHOS 5.5*  --  5.6* 5.7* 5.4*   Liver Function Tests: Recent Labs  Lab 10/02/2018 1507 10/02/18 0416 10/03/18 0600 10/04/18 0230 10-07-18 0400  AST 53* 50* 40 37 31  ALT 35 35 31 32 31  ALKPHOS 73 77 68 71 68  BILITOT 0.4 0.9 0.5 0.6 1.1  PROT 6.2* 6.5 6.2* 6.3* 5.8*  ALBUMIN 3.4*  3.4* 3.4* 3.4* 3.3*   No results for input(s): LIPASE, AMYLASE in the last 168 hours. No results for input(s): AMMONIA in the last 168 hours. CBC: Recent Labs  Lab 10/25/2018 1507 10/02/18 0416 10/03/18 0600 10/04/18 0230 2018-10-14 0400  WBC 8.3 7.5 8.1 11.5* 11.5*  NEUTROABS 6.2 7.0 6.7 9.6* 9.8*  HGB 10.6* 11.0* 10.4* 10.8* 10.9*  HCT 40.4 41.5 39.7 40.3 40.9  MCV 103.3* 104.0* 102.3* 103.3* 102.5*  PLT 210 196 212 235 212   Cardiac Enzymes: No results for input(s): CKTOTAL, CKMB, CKMBINDEX, TROPONINI in the last 168 hours. Sepsis Labs: Recent Labs  Lab 10/03/2018 1503  10/25/2018 1507 10/02/18 0416 10/03/18 0600 10/04/18 0230 October 14, 2018 0400  PROCALCITON  --   --  0.93  --   --   --   --   WBC  --    < > 8.3 7.5 8.1 11.5* 11.5*  LATICACIDVEN 1.2  --   --   --   --   --   --    < > = values in this interval not displayed.    Procedures/Operations     Mitchell Epling 10-14-2018, 2:00 PM

## 2018-10-28 NOTE — Progress Notes (Signed)
Called to discuss facetime options with [facility]. Arranged to have a group facetime with Jenny Reichmann and other family members at 1pm. Will also reserve 4pm for another potential time for a 2nd facetime later today. Jenny Reichmann is agreeable to same.

## 2018-10-28 NOTE — Plan of Care (Signed)

## 2018-10-28 NOTE — Progress Notes (Signed)
Much more lethargic compared to yesterday-more tachypneic as well.  Now on 8 L of high flow oxygen.  Clearly deteriorating spoke at length with patient's daughter-[name]-agreeable to transition to full comfort measures.

## 2018-10-28 NOTE — Progress Notes (Signed)
Pt currently on 8L HFNC. MD at bedside and aware that pt is not eating and unable to follow commands to swallow at this time. MD states he will speak with the family and discuss possible comfort care. CN aware

## 2018-10-28 NOTE — Progress Notes (Signed)
PROGRESS NOTE                                                                                                                                                                                                             Patient Demographics:    [name], is a 85 y.o. female, DOB - [birth year], [id number]  Outpatient Primary MD for the patient is Hilbert Corrigan, MD   Admit date - 10/26/2018   LOS - 4  Chief Complaint  Patient presents with   Shortness of Breath       Brief Narrative: Patient is a 85 y.o. female with PMHx of chronic hypoxemic respiratory failure on 4 L of oxygen at home, chronic combined systolic and diastolic heart failure, permanent atrial fibrillation on Xarelto, pacemaker in place, HTN-presented to the hospital with worsening shortness of breath-she was found to have acute hypoxemic respiratory failure secondary to COVID-19 pneumonia and admitted to the hospitalist service.  See below for further details.   Subjective:    Navjot Masci today remains very lethargic and confused-more tachypneic compared to yesterday.  She is now on 8 L.  Per nursing staff-no oral intake for 3 days   Assessment  & Plan :   Acute Hypoxic Resp Failure due to Covid 19 Viral pneumonia: Continues to worsen-more lethargic and tachypneic compared to yesterday.  Unfortunately deteriorating inspite of steroids, Remdesivir and intravenous Lasix.  Spoke at length with patient's daughter over the phone-she is agreeable to transition to full comfort measures.  We will go ahead and stop steroids and Remdesivir.  Fever: afebrile  O2 requirements: On 8 /m   COVID-19 Labs: Recent Labs    10/03/18 0600 10/04/18 0230 10/29/18 0400  DDIMER 2.88* 2.61* 3.37*  FERRITIN 46 54 57  CRP 1.4* 1.7* 2.5*    Lab Results  Component Value Date   SARSCOV2NAA POSITIVE (A) 10/18/2018     COVID-19  Medications: Steroids:9/4>>9/8 Remdesivir:9/5>>9/8 Actemra: Not given Convalescent Plasma: Not given Research Studies:N/A  Other medications: Diuretics: Significant edema in the thighs-start scheduled Lasix Antibiotics:Not needed as no evidence of bacterial infection  DVT Prophylaxis  : Xarelto  Acute on chronic combined systolic and diastolic heart failure (EF 30-35% by echo on 9/6): Volume overloaded-decrease IV fluids-start Lasix.  Watch renal function.  Difficult situation-given poor oral intake and borderline hyponatremia.  AKI  on CKD stage III: AKI likely hemodynamically mediated-still somewhat volume overloaded-not a urine output even after starting intravenous Lasix yesterday  Dysphagia: Secondary to severe deconditioning/acute illness-remains on a dysphagia 1 diet-speech therapy was following.  However patient now transitioned to full comfort measures  Permanent atrial fibrillation: Rate controlled-have been discontinued Cardizem and Xarelto as full comfort measures in effect   History of complete heart block-s/p PPM  Pulmonary hypertension: Probably related to chronic lung disease, diastolic heart failure-stable for patient.  COPD: On home O2-4 L/min-see above regarding more oxygen requirements  Hypothyroidism: Stop Synthroid-full comfort care  Alzheimer's dementia with superimposed delirium and metabolic encephalopathy: Worsening lethargy/confusion likely from worsening hypoxia in a background of dementia.  Stop Aricept and Namenda-full comfort measures in effect.  Obesity: Estimated body mass index is 35.13 kg/m as calculated from the following:   Height as of this encounter: 4\' 11"  (1.499 m).   Weight as of this encounter: 78.9 kg.   Palliative care: Unfortunately she has deteriorated much more compared to yesterday.  Starting Dilaudid infusion-expect inpatient death over the next few days.   ABG:    Component Value Date/Time   PHART 7.431 03/01/2015 1450    PCO2ART 47.2 (H) 03/01/2015 1450   PO2ART 79.0 (L) 03/01/2015 1450   HCO3 29.9 (H) 03/01/2015 1450   TCO2 21 08/12/2010 1258   O2SAT 95.5 03/01/2015 1450    Vent Settings:    Condition -critically ill-full comfort measures in place.  Family Communication  : Daughter updated over the phone  Code Status :  DNR  Diet :  Diet Order            DIET - DYS 1 Room service appropriate? Yes; Fluid consistency: Nectar Thick  Diet effective now               Disposition Plan  :  Remain hospitalized  Consults  :  None  Procedures  :  None  Antibiotics  :    Anti-infectives (From admission, onward)   Start     Dose/Rate Route Frequency Ordered Stop   10/03/18 0800  remdesivir 100 mg in sodium chloride 0.9 % 250 mL IVPB  Status:  Discontinued     100 mg 500 mL/hr over 30 Minutes Intravenous Every 24 hours 10/02/18 0622 10-17-18 1024   10/02/18 0800  remdesivir 200 mg in sodium chloride 0.9 % 250 mL IVPB     200 mg 500 mL/hr over 30 Minutes Intravenous Once 10/02/18 0622 10/02/18 1740      Inpatient Medications  Scheduled Meds:  sodium chloride flush  3 mL Intravenous Q12H   Continuous Infusions:  sodium chloride     HYDROmorphone     PRN Meds:.sodium chloride, acetaminophen **OR** acetaminophen, antiseptic oral rinse, glycopyrrolate **OR** glycopyrrolate **OR** glycopyrrolate, haloperidol **OR** haloperidol **OR** haloperidol lactate, HYDROmorphone, LORazepam **OR** LORazepam **OR** LORazepam, LORazepam, magic mouthwash w/lidocaine, nystatin, ondansetron **OR** ondansetron (ZOFRAN) IV, polyvinyl alcohol, sodium chloride flush, traZODone   Time Spent in minutes  35    See all Orders from today for further details   Oren Binet M.D on 10-17-18 at 10:46 AM  To page go to [email or web address] - use universal password  [facility] -  Office  [phone]    Objective:   Vitals:   10/04/18 2000 10-17-2018 0000 17-Oct-2018 0620 17-Oct-2018 0800  BP:   (!) 121/55 (!)  113/46  Pulse:    78  Resp:    (!) 24  Temp: (!) 97.1 F (36.2 C) (!) 97.3  F (36.3 C)  (!) 97 F (36.1 C)  TempSrc: Axillary Axillary  Axillary  SpO2:    92%  Weight:      Height:        Wt Readings from Last 3 Encounters:  10/03/18 78.9 kg  09/08/18 87.5 kg  07/28/18 85.7 kg     Intake/Output Summary (Last 24 hours) at 10-22-2018 1046 Last data filed at October 22, 2018 0616 Gross per 24 hour  Intake 1138.8 ml  Output 450 ml  Net 688.8 ml     Physical Exam Gen Exam: Lethargic-tachypneic-some mild distress-remains confused HEENT:atraumatic, normocephalic Chest: B/L clear to auscultation anteriorly CVS:S1S2 regular Abdomen:soft non tender, non distended Extremities:no edema Neurology: Non focal Skin: no rash   Data Review:    CBC Recent Labs  Lab 10/20/2018 1507 10/02/18 0416 10/03/18 0600 10/04/18 0230 10-22-18 0400  WBC 8.3 7.5 8.1 11.5* 11.5*  HGB 10.6* 11.0* 10.4* 10.8* 10.9*  HCT 40.4 41.5 39.7 40.3 40.9  PLT 210 196 212 235 212  MCV 103.3* 104.0* 102.3* 103.3* 102.5*  MCH 27.1 27.6 26.8 27.7 27.3  MCHC 26.2* 26.5* 26.2* 26.8* 26.7*  RDW 16.3* 16.1* 16.2* 16.2* 15.8*  LYMPHSABS 1.3 0.3* 0.6* 0.5* 0.6*  MONOABS 0.7 0.2 0.9 1.3* 1.0  EOSABS 0.0 0.0 0.0 0.0 0.0  BASOSABS 0.0 0.0 0.0 0.0 0.0    Chemistries  Recent Labs  Lab 10/27/2018 1507 10/02/18 0416 10/02/18 2057 10/03/18 0600 10/04/18 0230 10-22-2018 0400  NA 146* 147* 150* 148* 145 144  K 5.6* 5.5* 4.9 4.8 4.5 4.0  CL 98 97* 97* 94* 94* 93*  CO2 37* 35* 37* 39* 40* 38*  GLUCOSE 83 87 98 160* 224* 134*  BUN 48* 52* 58* 62* 70* 75*  CREATININE 1.53* 1.61* 1.62* 1.50* 1.65* 1.68*  CALCIUM 8.3* 8.5* 8.7* 8.7* 8.8* 8.4*  MG  --  2.4  --  2.2 2.3 2.3  AST 53* 50*  --  40 37 31  ALT 35 35  --  31 32 31  ALKPHOS 73 77  --  68 71 68  BILITOT 0.4 0.9  --  0.5 0.6 1.1   ------------------------------------------------------------------------------------------------------------------ No results for  input(s): CHOL, HDL, LDLCALC, TRIG, CHOLHDL, LDLDIRECT in the last 72 hours.  No results found for: HGBA1C ------------------------------------------------------------------------------------------------------------------ No results for input(s): TSH, T4TOTAL, T3FREE, THYROIDAB in the last 72 hours.  Invalid input(s): FREET3 ------------------------------------------------------------------------------------------------------------------ Recent Labs    10/04/18 0230 10-22-2018 0400  FERRITIN 54 57    Coagulation profile Recent Labs  Lab 10/24/2018 1507  INR 1.1    Recent Labs    10/04/18 0230 10/22/2018 0400  DDIMER 2.61* 3.37*    Cardiac Enzymes No results for input(s): CKMB, TROPONINI, MYOGLOBIN in the last 168 hours.  Invalid input(s): CK ------------------------------------------------------------------------------------------------------------------    Component Value Date/Time   BNP 1,110.6 (H) 10/02/2018 2057    Micro Results Recent Results (from the past 240 hour(s))  Urine culture     Status: None   Collection Time: 10/13/2018 10:19 AM   Specimen: Urine, Catheterized  Result Value Ref Range Status   Specimen Description   Final    URINE, CATHETERIZED Performed at [facility], [address] [place], [place] [address]    Special Requests   Final    NONE Performed at [facility], [address] [place], [place] [address]    Culture   Final    NO GROWTH Performed at [facility], [place] [address] [place], [place] [address]  Report Status 10/03/2018 FINAL  Final  SARS Coronavirus 2 [facility] order, Performed in [facility] hospital lab) Nasopharyngeal Nasopharyngeal Swab     Status: Abnormal   Collection Time: 10/26/2018 10:19 AM   Specimen: Nasopharyngeal Swab  Result Value Ref Range Status   SARS Coronavirus 2 POSITIVE (A) NEGATIVE Final    Comment: RESULT CALLED TO, READ BACK BY AND VERIFIED WITH: C.KEMP AT 1229 ON [id number] BY THOMPSON  S. Performed at [facility], [address] [place], [place] [address]   Culture, blood (routine x 2)     Status: None (Preliminary result)   Collection Time: 10/25/2018 11:39 AM   Specimen: Left Antecubital; Blood  Result Value Ref Range Status   Specimen Description LEFT ANTECUBITAL BOTTLES DRAWN AEROBIC ONLY  Final   Special Requests Blood Culture adequate volume  Final   Culture   Final    NO GROWTH 4 DAYS Performed at [facility], [address] [place], [place] [address]    Report Status PENDING  Incomplete  Culture, blood (routine x 2)     Status: None (Preliminary result)   Collection Time: 10/08/2018  3:07 PM   Specimen: BLOOD RIGHT ARM  Result Value Ref Range Status   Specimen Description BLOOD RIGHT ARM  Final   Special Requests   Final    BOTTLES DRAWN AEROBIC AND ANAEROBIC Blood Culture adequate volume   Culture   Final    NO GROWTH 4 DAYS Performed at [facility], [address], [place], [place] [address]    Report Status PENDING  Incomplete    Radiology Reports Dg Chest Port 1 View  Result Date: 10/02/2018 CLINICAL DATA:  Encounter for PICC placement EXAM: PORTABLE CHEST 1 VIEW COMPARISON:  Chest radiograph 10/22/2018 FINDINGS: Left chest pacer in place. Interval placement of a right upper extremity PICC with tip projecting over the cavoatrial junction. Stable enlarged cardiomediastinal contours. Improved aeration in the bilateral lungs. There is central venous congestion and bilateral diffuse interstitial opacities likely representing pulmonary edema versus atypical infection. Bibasilar atelectasis and probable small bilateral pleural effusions. No pneumothorax. IMPRESSION: 1. Interval placement of right upper extremity PICC with tip projecting over the cavoatrial junction. 2. Improved aeration of the bilateral lungs with bilateral predominantly interstitial opacities, may represent edema versus atypical infection. Electronically Signed   By: Audie Pinto M.D.   On:  10/02/2018 16:59   Dg Chest Port 1 View  Result Date: 09/28/2018 CLINICAL DATA:  Short of breath.  COVID-19 positive EXAM: PORTABLE CHEST 1 VIEW COMPARISON:  05/11/2017 FINDINGS: Marked cardiac enlargement.  Single lead pacemaker unchanged. Interval development of diffuse bilateral airspace disease right greater than left. Small pleural effusions and bibasilar atelectasis. IMPRESSION: Interval development of diffuse bilateral airspace disease. This could represent congestive heart failure and edema versus pneumonia. Electronically Signed   By: Franchot Gallo M.D.   On: 10/27/2018 11:07   Dg Chest Port 1v Same Day  Result Date: 10/04/2018 CLINICAL DATA:  sob EXAM: PORTABLE CHEST 1 VIEW COMPARISON:  10/02/2018 FINDINGS: LEFT-sided transvenous pacemaker lead overlies the RIGHT ventricle. PICC line tip overlies the superior vena cava, stable in appearance. The heart is enlarged and stable in configuration. There is persistent dense opacity at the LEFT lung base, stable in appearance. Patchy parenchymal opacities throughout the UPPER lobes and RIGHT LOWER lobe also appear stable. IMPRESSION: Stable appearance of the chest. Electronically Signed   By: Nolon Nations M.D.   On: 10/04/2018 11:06   Vas [place] Upper Extremity Venous Duplex  Result Date: 10/04/2018 UPPER VENOUS STUDY  Indications: Swelling Risk Factors: None identified. Limitations: Patient movement, bandages and line. Comparison Study: No prior studies. Performing Technologist: Oliver Hum RVT  Examination Guidelines: A complete evaluation includes B-mode imaging, spectral Doppler, color Doppler, and power Doppler as needed of all accessible portions of each vessel. Bilateral testing is considered an integral part of a complete examination. Limited examinations for reoccurring indications may be performed as noted.  Right Findings: +----------+------------+---------+-----------+----------+-------+  RIGHT       Compressible Phasicity Spontaneous Properties Summary  +----------+------------+---------+-----------+----------+-------+  IJV            Full        Yes        Yes                         +----------+------------+---------+-----------+----------+-------+  Subclavian     Full        Yes        Yes                         +----------+------------+---------+-----------+----------+-------+  Axillary       Full        Yes        Yes                         +----------+------------+---------+-----------+----------+-------+  Brachial       Full        Yes        Yes                         +----------+------------+---------+-----------+----------+-------+  Radial         Full                                               +----------+------------+---------+-----------+----------+-------+  Ulnar          Full                                               +----------+------------+---------+-----------+----------+-------+  Cephalic       Full                                               +----------+------------+---------+-----------+----------+-------+  Basilic        Full                                               +----------+------------+---------+-----------+----------+-------+ Arterial flow detected in the radial and ulnar arteries demonstrated triphasic waveforms.  Left Findings: +----------+------------+---------+-----------+----------+-------+  LEFT       Compressible Phasicity Spontaneous Properties Summary  +----------+------------+---------+-----------+----------+-------+  IJV            Full        Yes        Yes                         +----------+------------+---------+-----------+----------+-------+  Subclavian     Full        Yes        Yes                         +----------+------------+---------+-----------+----------+-------+  Axillary       Full        Yes        Yes                         +----------+------------+---------+-----------+----------+-------+  Brachial       Full        Yes        Yes                          +----------+------------+---------+-----------+----------+-------+  Radial         Full                                               +----------+------------+---------+-----------+----------+-------+  Ulnar          Full                                               +----------+------------+---------+-----------+----------+-------+  Cephalic       Full                                               +----------+------------+---------+-----------+----------+-------+  Basilic        Full                                               +----------+------------+---------+-----------+----------+-------+ Arterial flow detected in the radial and ulnar arteries demonstrated triphasic waveforms.  Summary:  Right: No evidence of deep vein thrombosis in the upper extremity. No evidence of superficial vein thrombosis in the upper extremity.  Left: No evidence of deep vein thrombosis in the upper extremity. No evidence of superficial vein thrombosis in the upper extremity.  *See table(s) above for measurements and observations.  Diagnosing physician: Harold Barban MD Electronically signed by Harold Barban MD on 10/04/2018 at 1:13:26 AM.    Final    [place] Ekg Site Rite  Result Date: 10/02/2018 If Site Rite image not attached, placement could not be confirmed due to current cardiac rhythm.

## 2018-10-28 DEATH — deceased

## 2018-10-29 ENCOUNTER — Encounter: Payer: PPO | Admitting: Internal Medicine

## 2018-12-17 ENCOUNTER — Telehealth: Payer: PPO | Admitting: Cardiology
